# Patient Record
Sex: Female | Born: 1950 | Race: White | Hispanic: No | Marital: Single
Health system: Southern US, Community
[De-identification: ages and names within clinical notes are randomized; demographics above are authoritative.]

## PROBLEM LIST (undated history)

## (undated) DIAGNOSIS — Z86010 Personal history of colonic polyps: Secondary | ICD-10-CM

## (undated) DIAGNOSIS — I2699 Other pulmonary embolism without acute cor pulmonale: Secondary | ICD-10-CM

## (undated) DIAGNOSIS — M1990 Unspecified osteoarthritis, unspecified site: Secondary | ICD-10-CM

## (undated) DIAGNOSIS — C50911 Malignant neoplasm of unspecified site of right female breast: Secondary | ICD-10-CM

## (undated) DIAGNOSIS — K219 Gastro-esophageal reflux disease without esophagitis: Secondary | ICD-10-CM

## (undated) DIAGNOSIS — M545 Low back pain, unspecified: Secondary | ICD-10-CM

## (undated) DIAGNOSIS — E7800 Pure hypercholesterolemia, unspecified: Secondary | ICD-10-CM

## (undated) DIAGNOSIS — F32A Depression, unspecified: Secondary | ICD-10-CM

## (undated) DIAGNOSIS — E669 Obesity, unspecified: Secondary | ICD-10-CM

## (undated) DIAGNOSIS — G8929 Other chronic pain: Secondary | ICD-10-CM

## (undated) DIAGNOSIS — Z9221 Personal history of antineoplastic chemotherapy: Secondary | ICD-10-CM

## (undated) DIAGNOSIS — I82409 Acute embolism and thrombosis of unspecified deep veins of unspecified lower extremity: Secondary | ICD-10-CM

## (undated) DIAGNOSIS — I10 Essential (primary) hypertension: Secondary | ICD-10-CM

## (undated) DIAGNOSIS — Z860101 Personal history of adenomatous and serrated colon polyps: Secondary | ICD-10-CM

## (undated) DIAGNOSIS — J45909 Unspecified asthma, uncomplicated: Secondary | ICD-10-CM

## (undated) DIAGNOSIS — M4310 Spondylolisthesis, site unspecified: Secondary | ICD-10-CM

## (undated) DIAGNOSIS — F329 Major depressive disorder, single episode, unspecified: Secondary | ICD-10-CM

## (undated) DIAGNOSIS — Z923 Personal history of irradiation: Secondary | ICD-10-CM

## (undated) DIAGNOSIS — K5790 Diverticulosis of intestine, part unspecified, without perforation or abscess without bleeding: Secondary | ICD-10-CM

## (undated) DIAGNOSIS — F419 Anxiety disorder, unspecified: Secondary | ICD-10-CM

## (undated) HISTORY — DX: Personal history of colonic polyps: Z86.010

## (undated) HISTORY — PX: CARDIAC CATHETERIZATION: SHX172

## (undated) HISTORY — DX: Major depressive disorder, single episode, unspecified: F32.9

## (undated) HISTORY — PX: TUBAL LIGATION: SHX77

## (undated) HISTORY — DX: Unspecified osteoarthritis, unspecified site: M19.90

## (undated) HISTORY — DX: Diverticulosis of intestine, part unspecified, without perforation or abscess without bleeding: K57.90

## (undated) HISTORY — DX: Gastro-esophageal reflux disease without esophagitis: K21.9

## (undated) HISTORY — DX: Personal history of adenomatous and serrated colon polyps: Z86.0101

## (undated) HISTORY — PX: HERNIA REPAIR: SHX51

## (undated) HISTORY — DX: Depression, unspecified: F32.A

## (undated) HISTORY — DX: Obesity, unspecified: E66.9

---

## 1981-03-17 HISTORY — PX: UMBILICAL HERNIA REPAIR: SHX196

## 1997-06-30 ENCOUNTER — Encounter: Admission: RE | Admit: 1997-06-30 | Discharge: 1997-06-30 | Payer: Self-pay | Admitting: Family Medicine

## 1997-08-17 ENCOUNTER — Encounter: Admission: RE | Admit: 1997-08-17 | Discharge: 1997-08-17 | Payer: Self-pay | Admitting: Family Medicine

## 1997-08-28 ENCOUNTER — Encounter: Admission: RE | Admit: 1997-08-28 | Discharge: 1997-08-28 | Payer: Self-pay | Admitting: Family Medicine

## 1997-10-10 ENCOUNTER — Encounter: Admission: RE | Admit: 1997-10-10 | Discharge: 1997-10-10 | Payer: Self-pay | Admitting: Sports Medicine

## 1997-10-23 ENCOUNTER — Encounter: Admission: RE | Admit: 1997-10-23 | Discharge: 1997-10-23 | Payer: Self-pay | Admitting: Family Medicine

## 1998-01-05 ENCOUNTER — Encounter: Admission: RE | Admit: 1998-01-05 | Discharge: 1998-01-05 | Payer: Self-pay | Admitting: Family Medicine

## 1998-02-27 ENCOUNTER — Encounter: Admission: RE | Admit: 1998-02-27 | Discharge: 1998-02-27 | Payer: Self-pay | Admitting: Family Medicine

## 1998-03-17 DIAGNOSIS — C50911 Malignant neoplasm of unspecified site of right female breast: Secondary | ICD-10-CM

## 1998-03-17 HISTORY — DX: Malignant neoplasm of unspecified site of right female breast: C50.911

## 1998-03-17 HISTORY — PX: BREAST BIOPSY: SHX20

## 1998-03-17 HISTORY — PX: BREAST LUMPECTOMY: SHX2

## 1998-03-30 ENCOUNTER — Ambulatory Visit (HOSPITAL_BASED_OUTPATIENT_CLINIC_OR_DEPARTMENT_OTHER): Admission: RE | Admit: 1998-03-30 | Discharge: 1998-03-30 | Payer: Self-pay | Admitting: General Surgery

## 1998-04-10 ENCOUNTER — Encounter: Payer: Self-pay | Admitting: General Surgery

## 1998-04-11 ENCOUNTER — Ambulatory Visit (HOSPITAL_COMMUNITY): Admission: RE | Admit: 1998-04-11 | Discharge: 1998-04-11 | Payer: Self-pay | Admitting: General Surgery

## 1998-04-11 ENCOUNTER — Encounter: Payer: Self-pay | Admitting: General Surgery

## 1998-04-22 ENCOUNTER — Emergency Department (HOSPITAL_COMMUNITY): Admission: EM | Admit: 1998-04-22 | Discharge: 1998-04-22 | Payer: Self-pay | Admitting: Emergency Medicine

## 1998-04-25 ENCOUNTER — Encounter: Admission: RE | Admit: 1998-04-25 | Discharge: 1998-07-24 | Payer: Self-pay | Admitting: Radiation Oncology

## 1998-04-26 ENCOUNTER — Encounter: Admission: RE | Admit: 1998-04-26 | Discharge: 1998-04-26 | Payer: Self-pay | Admitting: Family Medicine

## 1998-05-01 ENCOUNTER — Encounter: Admission: RE | Admit: 1998-05-01 | Discharge: 1998-05-01 | Payer: Self-pay | Admitting: Sports Medicine

## 1998-05-16 ENCOUNTER — Encounter: Payer: Self-pay | Admitting: General Surgery

## 1998-05-16 ENCOUNTER — Ambulatory Visit (HOSPITAL_BASED_OUTPATIENT_CLINIC_OR_DEPARTMENT_OTHER): Admission: RE | Admit: 1998-05-16 | Discharge: 1998-05-16 | Payer: Self-pay | Admitting: General Surgery

## 1998-06-04 ENCOUNTER — Encounter: Admission: RE | Admit: 1998-06-04 | Discharge: 1998-06-04 | Payer: Self-pay | Admitting: Sports Medicine

## 1998-07-25 ENCOUNTER — Encounter: Admission: RE | Admit: 1998-07-25 | Discharge: 1998-10-23 | Payer: Self-pay | Admitting: Radiation Oncology

## 1998-08-24 ENCOUNTER — Encounter: Admission: RE | Admit: 1998-08-24 | Discharge: 1998-08-24 | Payer: Self-pay | Admitting: Family Medicine

## 1998-09-05 ENCOUNTER — Encounter: Admission: RE | Admit: 1998-09-05 | Discharge: 1998-09-05 | Payer: Self-pay | Admitting: Family Medicine

## 1998-09-26 ENCOUNTER — Ambulatory Visit (HOSPITAL_BASED_OUTPATIENT_CLINIC_OR_DEPARTMENT_OTHER): Admission: RE | Admit: 1998-09-26 | Discharge: 1998-09-26 | Payer: Self-pay | Admitting: General Surgery

## 1998-11-20 ENCOUNTER — Encounter: Admission: RE | Admit: 1998-11-20 | Discharge: 1998-11-20 | Payer: Self-pay | Admitting: Sports Medicine

## 1998-12-29 ENCOUNTER — Emergency Department (HOSPITAL_COMMUNITY): Admission: EM | Admit: 1998-12-29 | Discharge: 1998-12-29 | Payer: Self-pay

## 1998-12-31 ENCOUNTER — Ambulatory Visit (HOSPITAL_COMMUNITY): Admission: RE | Admit: 1998-12-31 | Discharge: 1998-12-31 | Payer: Self-pay | Admitting: General Surgery

## 1998-12-31 ENCOUNTER — Encounter: Payer: Self-pay | Admitting: General Surgery

## 1999-01-01 ENCOUNTER — Emergency Department (HOSPITAL_COMMUNITY): Admission: EM | Admit: 1999-01-01 | Discharge: 1999-01-01 | Payer: Self-pay | Admitting: Emergency Medicine

## 1999-01-14 ENCOUNTER — Encounter: Admission: RE | Admit: 1999-01-14 | Discharge: 1999-01-14 | Payer: Self-pay | Admitting: Family Medicine

## 1999-04-15 ENCOUNTER — Encounter: Admission: RE | Admit: 1999-04-15 | Discharge: 1999-04-15 | Payer: Self-pay | Admitting: Gastroenterology

## 1999-04-15 ENCOUNTER — Encounter: Payer: Self-pay | Admitting: Gastroenterology

## 1999-05-04 ENCOUNTER — Emergency Department (HOSPITAL_COMMUNITY): Admission: EM | Admit: 1999-05-04 | Discharge: 1999-05-04 | Payer: Self-pay

## 1999-05-24 ENCOUNTER — Encounter: Payer: Self-pay | Admitting: Gastroenterology

## 1999-05-24 ENCOUNTER — Ambulatory Visit (HOSPITAL_COMMUNITY): Admission: RE | Admit: 1999-05-24 | Discharge: 1999-05-24 | Payer: Self-pay | Admitting: Gastroenterology

## 1999-08-20 ENCOUNTER — Encounter: Payer: Self-pay | Admitting: Family Medicine

## 1999-08-20 ENCOUNTER — Encounter: Admission: RE | Admit: 1999-08-20 | Discharge: 1999-08-20 | Payer: Self-pay | Admitting: Family Medicine

## 1999-09-12 ENCOUNTER — Encounter: Payer: Self-pay | Admitting: General Surgery

## 1999-09-12 ENCOUNTER — Encounter: Admission: RE | Admit: 1999-09-12 | Discharge: 1999-09-12 | Payer: Self-pay | Admitting: General Surgery

## 2000-08-19 ENCOUNTER — Ambulatory Visit (HOSPITAL_COMMUNITY): Admission: RE | Admit: 2000-08-19 | Discharge: 2000-08-19 | Payer: Self-pay | Admitting: Hematology and Oncology

## 2000-08-19 ENCOUNTER — Encounter: Payer: Self-pay | Admitting: Hematology and Oncology

## 2000-10-13 ENCOUNTER — Other Ambulatory Visit: Admission: RE | Admit: 2000-10-13 | Discharge: 2000-10-13 | Payer: Self-pay | Admitting: *Deleted

## 2000-10-15 ENCOUNTER — Encounter: Admission: RE | Admit: 2000-10-15 | Discharge: 2000-10-15 | Payer: Self-pay | Admitting: General Surgery

## 2000-10-15 ENCOUNTER — Encounter: Payer: Self-pay | Admitting: General Surgery

## 2000-11-12 ENCOUNTER — Ambulatory Visit (HOSPITAL_BASED_OUTPATIENT_CLINIC_OR_DEPARTMENT_OTHER): Admission: RE | Admit: 2000-11-12 | Discharge: 2000-11-12 | Payer: Self-pay | Admitting: *Deleted

## 2000-11-12 ENCOUNTER — Encounter (INDEPENDENT_AMBULATORY_CARE_PROVIDER_SITE_OTHER): Payer: Self-pay | Admitting: *Deleted

## 2001-09-13 ENCOUNTER — Ambulatory Visit (HOSPITAL_COMMUNITY): Admission: RE | Admit: 2001-09-13 | Discharge: 2001-09-13 | Payer: Self-pay | Admitting: Hematology and Oncology

## 2001-09-13 ENCOUNTER — Encounter: Payer: Self-pay | Admitting: Hematology and Oncology

## 2001-10-18 ENCOUNTER — Encounter: Admission: RE | Admit: 2001-10-18 | Discharge: 2001-10-18 | Payer: Self-pay | Admitting: Hematology and Oncology

## 2001-10-18 ENCOUNTER — Encounter: Payer: Self-pay | Admitting: Hematology and Oncology

## 2002-02-01 ENCOUNTER — Other Ambulatory Visit: Admission: RE | Admit: 2002-02-01 | Discharge: 2002-02-01 | Payer: Self-pay | Admitting: *Deleted

## 2002-02-03 ENCOUNTER — Encounter: Admission: RE | Admit: 2002-02-03 | Discharge: 2002-02-03 | Payer: Self-pay | Admitting: Obstetrics and Gynecology

## 2002-02-03 ENCOUNTER — Encounter: Payer: Self-pay | Admitting: Obstetrics and Gynecology

## 2002-03-29 ENCOUNTER — Encounter: Payer: Self-pay | Admitting: Oncology

## 2002-03-29 ENCOUNTER — Ambulatory Visit (HOSPITAL_COMMUNITY): Admission: RE | Admit: 2002-03-29 | Discharge: 2002-03-29 | Payer: Self-pay | Admitting: Oncology

## 2002-04-09 ENCOUNTER — Emergency Department (HOSPITAL_COMMUNITY): Admission: EM | Admit: 2002-04-09 | Discharge: 2002-04-09 | Payer: Self-pay | Admitting: Emergency Medicine

## 2002-11-12 ENCOUNTER — Encounter: Payer: Self-pay | Admitting: Emergency Medicine

## 2002-11-12 ENCOUNTER — Emergency Department (HOSPITAL_COMMUNITY): Admission: EM | Admit: 2002-11-12 | Discharge: 2002-11-12 | Payer: Self-pay | Admitting: Emergency Medicine

## 2002-11-23 ENCOUNTER — Other Ambulatory Visit: Admission: RE | Admit: 2002-11-23 | Discharge: 2002-11-23 | Payer: Self-pay | Admitting: *Deleted

## 2002-12-21 ENCOUNTER — Encounter: Payer: Self-pay | Admitting: Family Medicine

## 2002-12-21 ENCOUNTER — Encounter: Admission: RE | Admit: 2002-12-21 | Discharge: 2002-12-21 | Payer: Self-pay | Admitting: Family Medicine

## 2002-12-28 ENCOUNTER — Encounter: Payer: Self-pay | Admitting: Family Medicine

## 2002-12-28 ENCOUNTER — Encounter: Admission: RE | Admit: 2002-12-28 | Discharge: 2002-12-28 | Payer: Self-pay | Admitting: Family Medicine

## 2003-02-01 ENCOUNTER — Emergency Department (HOSPITAL_COMMUNITY): Admission: EM | Admit: 2003-02-01 | Discharge: 2003-02-01 | Payer: Self-pay | Admitting: Emergency Medicine

## 2003-04-22 ENCOUNTER — Emergency Department (HOSPITAL_COMMUNITY): Admission: AD | Admit: 2003-04-22 | Discharge: 2003-04-22 | Payer: Self-pay | Admitting: Internal Medicine

## 2003-08-25 ENCOUNTER — Emergency Department (HOSPITAL_COMMUNITY): Admission: AD | Admit: 2003-08-25 | Discharge: 2003-08-25 | Payer: Self-pay | Admitting: Family Medicine

## 2003-09-21 ENCOUNTER — Emergency Department (HOSPITAL_COMMUNITY): Admission: EM | Admit: 2003-09-21 | Discharge: 2003-09-21 | Payer: Self-pay | Admitting: Emergency Medicine

## 2003-09-22 ENCOUNTER — Encounter: Admission: RE | Admit: 2003-09-22 | Discharge: 2003-09-22 | Payer: Self-pay | Admitting: General Surgery

## 2003-10-06 ENCOUNTER — Encounter: Admission: RE | Admit: 2003-10-06 | Discharge: 2003-10-06 | Payer: Self-pay | Admitting: General Surgery

## 2003-12-14 ENCOUNTER — Other Ambulatory Visit: Admission: RE | Admit: 2003-12-14 | Discharge: 2003-12-14 | Payer: Self-pay | Admitting: *Deleted

## 2004-03-22 ENCOUNTER — Ambulatory Visit: Payer: Self-pay | Admitting: Hematology and Oncology

## 2004-04-05 ENCOUNTER — Encounter: Admission: RE | Admit: 2004-04-05 | Discharge: 2004-04-05 | Payer: Self-pay | Admitting: Hematology and Oncology

## 2004-06-26 ENCOUNTER — Ambulatory Visit: Payer: Self-pay | Admitting: Hematology and Oncology

## 2004-06-27 ENCOUNTER — Ambulatory Visit: Payer: Self-pay | Admitting: Hematology and Oncology

## 2004-08-18 ENCOUNTER — Emergency Department (HOSPITAL_COMMUNITY): Admission: EM | Admit: 2004-08-18 | Discharge: 2004-08-18 | Payer: Self-pay | Admitting: Emergency Medicine

## 2004-12-24 ENCOUNTER — Ambulatory Visit: Payer: Self-pay | Admitting: Hematology and Oncology

## 2005-03-28 ENCOUNTER — Emergency Department (HOSPITAL_COMMUNITY): Admission: EM | Admit: 2005-03-28 | Discharge: 2005-03-28 | Payer: Self-pay | Admitting: Family Medicine

## 2005-04-10 ENCOUNTER — Encounter: Admission: RE | Admit: 2005-04-10 | Discharge: 2005-04-10 | Payer: Self-pay | Admitting: Hematology and Oncology

## 2005-04-11 ENCOUNTER — Ambulatory Visit: Payer: Self-pay | Admitting: Internal Medicine

## 2005-04-22 ENCOUNTER — Ambulatory Visit: Payer: Self-pay | Admitting: Internal Medicine

## 2005-04-24 ENCOUNTER — Other Ambulatory Visit: Admission: RE | Admit: 2005-04-24 | Discharge: 2005-04-24 | Payer: Self-pay | Admitting: Gynecology

## 2005-04-24 ENCOUNTER — Ambulatory Visit: Payer: Self-pay | Admitting: Internal Medicine

## 2005-04-28 ENCOUNTER — Ambulatory Visit: Payer: Self-pay | Admitting: Internal Medicine

## 2005-06-24 ENCOUNTER — Ambulatory Visit: Payer: Self-pay | Admitting: Internal Medicine

## 2005-07-18 ENCOUNTER — Ambulatory Visit: Payer: Self-pay | Admitting: Hematology and Oncology

## 2005-07-23 ENCOUNTER — Ambulatory Visit: Payer: Self-pay | Admitting: Internal Medicine

## 2005-08-26 LAB — COMPREHENSIVE METABOLIC PANEL
ALT: 9 U/L (ref 0–40)
AST: 13 U/L (ref 0–37)
Albumin: 4.1 g/dL (ref 3.5–5.2)
Alkaline Phosphatase: 75 U/L (ref 39–117)
BUN: 18 mg/dL (ref 6–23)
CO2: 26 mEq/L (ref 19–32)
Calcium: 9.2 mg/dL (ref 8.4–10.5)
Chloride: 105 mEq/L (ref 96–112)
Creatinine, Ser: 1 mg/dL (ref 0.40–1.20)
Glucose, Bld: 98 mg/dL (ref 70–99)
Potassium: 4.3 mEq/L (ref 3.5–5.3)
Sodium: 140 mEq/L (ref 135–145)
Total Bilirubin: 0.6 mg/dL (ref 0.3–1.2)
Total Protein: 7.2 g/dL (ref 6.0–8.3)

## 2005-08-26 LAB — CBC WITH DIFFERENTIAL/PLATELET
BASO%: 0.6 % (ref 0.0–2.0)
EOS%: 2.3 % (ref 0.0–7.0)
LYMPH%: 30.1 % (ref 14.0–48.0)
MCHC: 34.7 g/dL (ref 32.0–36.0)
MONO#: 0.4 10*3/uL (ref 0.1–0.9)
RBC: 4.16 10*6/uL (ref 3.70–5.32)
WBC: 5.5 10*3/uL (ref 3.9–10.0)
lymph#: 1.7 10*3/uL (ref 0.9–3.3)

## 2005-08-26 LAB — CANCER ANTIGEN 27.29: CA 27.29: 13 U/mL (ref 0–39)

## 2005-10-20 ENCOUNTER — Ambulatory Visit: Payer: Self-pay | Admitting: Internal Medicine

## 2006-02-23 ENCOUNTER — Ambulatory Visit: Payer: Self-pay | Admitting: Hematology and Oncology

## 2006-02-25 LAB — CBC WITH DIFFERENTIAL/PLATELET
Basophils Absolute: 0 10*3/uL (ref 0.0–0.1)
Eosinophils Absolute: 0.1 10*3/uL (ref 0.0–0.5)
HCT: 36.3 % (ref 34.8–46.6)
HGB: 12.5 g/dL (ref 11.6–15.9)
LYMPH%: 31 % (ref 14.0–48.0)
MONO#: 0.4 10*3/uL (ref 0.1–0.9)
NEUT#: 3.3 10*3/uL (ref 1.5–6.5)
Platelets: 265 10*3/uL (ref 145–400)
RBC: 3.78 10*6/uL (ref 3.70–5.32)
WBC: 5.6 10*3/uL (ref 3.9–10.0)

## 2006-02-25 LAB — COMPREHENSIVE METABOLIC PANEL
Albumin: 4.2 g/dL (ref 3.5–5.2)
CO2: 25 mEq/L (ref 19–32)
Glucose, Bld: 99 mg/dL (ref 70–99)
Sodium: 141 mEq/L (ref 135–145)
Total Bilirubin: 0.6 mg/dL (ref 0.3–1.2)
Total Protein: 6.5 g/dL (ref 6.0–8.3)

## 2006-02-25 LAB — CANCER ANTIGEN 27.29: CA 27.29: 13 U/mL (ref 0–39)

## 2006-02-25 LAB — LACTATE DEHYDROGENASE: LDH: 167 U/L (ref 94–250)

## 2006-04-13 ENCOUNTER — Encounter: Admission: RE | Admit: 2006-04-13 | Discharge: 2006-04-13 | Payer: Self-pay | Admitting: Hematology and Oncology

## 2006-04-14 ENCOUNTER — Ambulatory Visit: Payer: Self-pay | Admitting: Hematology and Oncology

## 2006-05-16 ENCOUNTER — Emergency Department (HOSPITAL_COMMUNITY): Admission: EM | Admit: 2006-05-16 | Discharge: 2006-05-16 | Payer: Self-pay | Admitting: Family Medicine

## 2006-09-16 ENCOUNTER — Encounter: Admission: RE | Admit: 2006-09-16 | Discharge: 2006-09-16 | Payer: Self-pay | Admitting: Internal Medicine

## 2006-11-08 ENCOUNTER — Emergency Department (HOSPITAL_COMMUNITY): Admission: EM | Admit: 2006-11-08 | Discharge: 2006-11-08 | Payer: Self-pay | Admitting: Emergency Medicine

## 2006-12-04 ENCOUNTER — Emergency Department (HOSPITAL_COMMUNITY): Admission: EM | Admit: 2006-12-04 | Discharge: 2006-12-04 | Payer: Self-pay | Admitting: Emergency Medicine

## 2006-12-05 ENCOUNTER — Emergency Department (HOSPITAL_COMMUNITY): Admission: EM | Admit: 2006-12-05 | Discharge: 2006-12-05 | Payer: Self-pay | Admitting: Emergency Medicine

## 2006-12-07 ENCOUNTER — Ambulatory Visit (HOSPITAL_COMMUNITY): Admission: RE | Admit: 2006-12-07 | Discharge: 2006-12-07 | Payer: Self-pay | Admitting: Emergency Medicine

## 2006-12-16 ENCOUNTER — Ambulatory Visit: Payer: Self-pay | Admitting: Hematology and Oncology

## 2006-12-18 LAB — COMPREHENSIVE METABOLIC PANEL
ALT: 10 U/L (ref 0–35)
AST: 13 U/L (ref 0–37)
Alkaline Phosphatase: 81 U/L (ref 39–117)
BUN: 18 mg/dL (ref 6–23)
Chloride: 105 mEq/L (ref 96–112)
Creatinine, Ser: 1.22 mg/dL — ABNORMAL HIGH (ref 0.40–1.20)
Total Bilirubin: 0.7 mg/dL (ref 0.3–1.2)

## 2006-12-18 LAB — CBC WITH DIFFERENTIAL/PLATELET
BASO%: 0.7 % (ref 0.0–2.0)
Basophils Absolute: 0 10*3/uL (ref 0.0–0.1)
EOS%: 2.9 % (ref 0.0–7.0)
HCT: 39.2 % (ref 34.8–46.6)
LYMPH%: 32.7 % (ref 14.0–48.0)
MCH: 33.3 pg (ref 26.0–34.0)
MCHC: 35.3 g/dL (ref 32.0–36.0)
MCV: 94.2 fL (ref 81.0–101.0)
MONO%: 6.7 % (ref 0.0–13.0)
NEUT%: 57 % (ref 39.6–76.8)
lymph#: 1.6 10*3/uL (ref 0.9–3.3)

## 2006-12-31 ENCOUNTER — Ambulatory Visit (HOSPITAL_COMMUNITY): Admission: RE | Admit: 2006-12-31 | Discharge: 2006-12-31 | Payer: Self-pay | Admitting: Internal Medicine

## 2007-01-21 ENCOUNTER — Encounter: Payer: Self-pay | Admitting: Internal Medicine

## 2007-01-21 DIAGNOSIS — Z853 Personal history of malignant neoplasm of breast: Secondary | ICD-10-CM

## 2007-01-21 DIAGNOSIS — F329 Major depressive disorder, single episode, unspecified: Secondary | ICD-10-CM | POA: Insufficient documentation

## 2007-01-21 DIAGNOSIS — M545 Low back pain: Secondary | ICD-10-CM

## 2007-01-21 DIAGNOSIS — M1990 Unspecified osteoarthritis, unspecified site: Secondary | ICD-10-CM | POA: Insufficient documentation

## 2007-01-27 ENCOUNTER — Ambulatory Visit: Payer: Self-pay | Admitting: Internal Medicine

## 2007-04-13 ENCOUNTER — Ambulatory Visit: Payer: Self-pay | Admitting: Internal Medicine

## 2007-04-13 DIAGNOSIS — K219 Gastro-esophageal reflux disease without esophagitis: Secondary | ICD-10-CM | POA: Insufficient documentation

## 2007-04-13 DIAGNOSIS — J310 Chronic rhinitis: Secondary | ICD-10-CM | POA: Insufficient documentation

## 2007-04-13 DIAGNOSIS — J440 Chronic obstructive pulmonary disease with acute lower respiratory infection: Secondary | ICD-10-CM | POA: Insufficient documentation

## 2007-04-18 ENCOUNTER — Emergency Department (HOSPITAL_COMMUNITY): Admission: EM | Admit: 2007-04-18 | Discharge: 2007-04-18 | Payer: Self-pay | Admitting: Emergency Medicine

## 2007-05-13 ENCOUNTER — Encounter: Payer: Self-pay | Admitting: Internal Medicine

## 2007-08-05 ENCOUNTER — Encounter: Admission: RE | Admit: 2007-08-05 | Discharge: 2007-08-05 | Payer: Self-pay | Admitting: Internal Medicine

## 2007-08-30 ENCOUNTER — Ambulatory Visit: Payer: Self-pay | Admitting: Hematology and Oncology

## 2007-09-11 ENCOUNTER — Emergency Department (HOSPITAL_COMMUNITY): Admission: EM | Admit: 2007-09-11 | Discharge: 2007-09-11 | Payer: Self-pay | Admitting: Emergency Medicine

## 2007-09-14 LAB — COMPREHENSIVE METABOLIC PANEL
Albumin: 4 g/dL (ref 3.5–5.2)
Alkaline Phosphatase: 62 U/L (ref 39–117)
BUN: 20 mg/dL (ref 6–23)
Creatinine, Ser: 0.95 mg/dL (ref 0.40–1.20)
Glucose, Bld: 92 mg/dL (ref 70–99)
Total Bilirubin: 0.3 mg/dL (ref 0.3–1.2)

## 2007-09-14 LAB — CBC WITH DIFFERENTIAL/PLATELET
BASO%: 0.2 % (ref 0.0–2.0)
Basophils Absolute: 0 10*3/uL (ref 0.0–0.1)
EOS%: 0.1 % (ref 0.0–7.0)
HCT: 37.2 % (ref 34.8–46.6)
HGB: 12.9 g/dL (ref 11.6–15.9)
LYMPH%: 19 % (ref 14.0–48.0)
MCH: 32.9 pg (ref 26.0–34.0)
MCHC: 34.6 g/dL (ref 32.0–36.0)
MCV: 94.9 fL (ref 81.0–101.0)
MONO%: 4.9 % (ref 0.0–13.0)
NEUT%: 75.8 % (ref 39.6–76.8)

## 2007-09-16 ENCOUNTER — Encounter: Admission: RE | Admit: 2007-09-16 | Discharge: 2007-09-16 | Payer: Self-pay | Admitting: Hematology and Oncology

## 2007-09-20 ENCOUNTER — Ambulatory Visit (HOSPITAL_COMMUNITY): Admission: RE | Admit: 2007-09-20 | Discharge: 2007-09-20 | Payer: Self-pay | Admitting: Hematology and Oncology

## 2007-10-20 ENCOUNTER — Emergency Department (HOSPITAL_COMMUNITY): Admission: EM | Admit: 2007-10-20 | Discharge: 2007-10-21 | Payer: Self-pay | Admitting: Emergency Medicine

## 2008-02-03 ENCOUNTER — Ambulatory Visit: Payer: Self-pay | Admitting: Cardiovascular Disease

## 2008-02-03 ENCOUNTER — Observation Stay (HOSPITAL_COMMUNITY): Admission: EM | Admit: 2008-02-03 | Discharge: 2008-02-04 | Payer: Self-pay | Admitting: Emergency Medicine

## 2008-02-04 ENCOUNTER — Ambulatory Visit: Payer: Self-pay

## 2008-03-05 ENCOUNTER — Emergency Department (HOSPITAL_COMMUNITY): Admission: EM | Admit: 2008-03-05 | Discharge: 2008-03-05 | Payer: Self-pay | Admitting: Family Medicine

## 2008-03-20 ENCOUNTER — Telehealth: Payer: Self-pay | Admitting: Internal Medicine

## 2008-06-01 ENCOUNTER — Ambulatory Visit: Payer: Self-pay | Admitting: Hematology and Oncology

## 2008-06-05 LAB — COMPREHENSIVE METABOLIC PANEL
ALT: 16 U/L (ref 0–35)
AST: 17 U/L (ref 0–37)
CO2: 28 mEq/L (ref 19–32)
Chloride: 106 mEq/L (ref 96–112)
Creatinine, Ser: 1.03 mg/dL (ref 0.40–1.20)
Sodium: 140 mEq/L (ref 135–145)
Total Bilirubin: 0.5 mg/dL (ref 0.3–1.2)
Total Protein: 7 g/dL (ref 6.0–8.3)

## 2008-06-05 LAB — CBC WITH DIFFERENTIAL/PLATELET
BASO%: 0.7 % (ref 0.0–2.0)
Eosinophils Absolute: 0.1 10*3/uL (ref 0.0–0.5)
LYMPH%: 32.1 % (ref 14.0–49.7)
MCHC: 34.4 g/dL (ref 31.5–36.0)
MONO#: 0.3 10*3/uL (ref 0.1–0.9)
NEUT#: 3.2 10*3/uL (ref 1.5–6.5)
Platelets: 238 10*3/uL (ref 145–400)
RBC: 3.82 10*6/uL (ref 3.70–5.45)
RDW: 13.2 % (ref 11.2–14.5)
WBC: 5.4 10*3/uL (ref 3.9–10.3)

## 2008-06-05 LAB — LACTATE DEHYDROGENASE: LDH: 141 U/L (ref 94–250)

## 2008-08-21 ENCOUNTER — Emergency Department (HOSPITAL_COMMUNITY): Admission: EM | Admit: 2008-08-21 | Discharge: 2008-08-21 | Payer: Self-pay | Admitting: Family Medicine

## 2009-01-16 ENCOUNTER — Emergency Department (HOSPITAL_COMMUNITY): Admission: EM | Admit: 2009-01-16 | Discharge: 2009-01-16 | Payer: Self-pay | Admitting: Emergency Medicine

## 2009-01-18 ENCOUNTER — Ambulatory Visit: Payer: Self-pay | Admitting: Pulmonary Disease

## 2009-01-18 DIAGNOSIS — R079 Chest pain, unspecified: Secondary | ICD-10-CM

## 2009-01-22 ENCOUNTER — Ambulatory Visit: Payer: Self-pay | Admitting: Internal Medicine

## 2009-01-23 LAB — CONVERTED CEMR LAB
Basophils Absolute: 0 10*3/uL (ref 0.0–0.1)
Basophils Relative: 0.5 % (ref 0.0–3.0)
Eosinophils Relative: 3.4 % (ref 0.0–5.0)
HCT: 37.1 % (ref 36.0–46.0)
Hemoglobin: 13.1 g/dL (ref 12.0–15.0)
Lymphocytes Relative: 31.2 % (ref 12.0–46.0)
MCHC: 35.3 g/dL (ref 30.0–36.0)
Neutrophils Relative %: 56.9 % (ref 43.0–77.0)
Platelets: 197 10*3/uL (ref 150.0–400.0)

## 2009-03-22 ENCOUNTER — Telehealth: Payer: Self-pay | Admitting: Internal Medicine

## 2009-03-30 ENCOUNTER — Ambulatory Visit: Payer: Self-pay | Admitting: Internal Medicine

## 2009-04-16 ENCOUNTER — Telehealth: Payer: Self-pay | Admitting: Internal Medicine

## 2009-04-18 ENCOUNTER — Ambulatory Visit: Payer: Self-pay | Admitting: Internal Medicine

## 2009-05-24 ENCOUNTER — Ambulatory Visit: Payer: Self-pay | Admitting: Hematology and Oncology

## 2009-06-27 ENCOUNTER — Ambulatory Visit: Payer: Self-pay | Admitting: Hematology and Oncology

## 2009-06-29 LAB — COMPREHENSIVE METABOLIC PANEL
AST: 17 U/L (ref 0–37)
Albumin: 3.5 g/dL (ref 3.5–5.2)
BUN: 17 mg/dL (ref 6–23)
Chloride: 108 mEq/L (ref 96–112)
Glucose, Bld: 101 mg/dL — ABNORMAL HIGH (ref 70–99)
Potassium: 3.6 mEq/L (ref 3.5–5.3)
Sodium: 141 mEq/L (ref 135–145)
Total Bilirubin: 0.5 mg/dL (ref 0.3–1.2)

## 2009-06-29 LAB — CANCER ANTIGEN 27.29: CA 27.29: 13 U/mL (ref 0–39)

## 2009-06-29 LAB — CBC WITH DIFFERENTIAL/PLATELET
HCT: 35.8 % (ref 34.8–46.6)
HGB: 12 g/dL (ref 11.6–15.9)
MCH: 32.5 pg (ref 25.1–34.0)
MONO#: 0.3 10*3/uL (ref 0.1–0.9)
MONO%: 5.6 % (ref 0.0–14.0)
NEUT#: 2.7 10*3/uL (ref 1.5–6.5)
RDW: 13.2 % (ref 11.2–14.5)
WBC: 5.2 10*3/uL (ref 3.9–10.3)

## 2009-07-04 ENCOUNTER — Encounter: Admission: RE | Admit: 2009-07-04 | Discharge: 2009-07-04 | Payer: Self-pay | Admitting: Hematology and Oncology

## 2009-10-05 ENCOUNTER — Telehealth: Payer: Self-pay | Admitting: Internal Medicine

## 2009-12-31 ENCOUNTER — Encounter (INDEPENDENT_AMBULATORY_CARE_PROVIDER_SITE_OTHER): Payer: Self-pay | Admitting: *Deleted

## 2010-02-05 ENCOUNTER — Encounter (INDEPENDENT_AMBULATORY_CARE_PROVIDER_SITE_OTHER): Payer: Self-pay | Admitting: *Deleted

## 2010-02-06 ENCOUNTER — Ambulatory Visit: Payer: Self-pay | Admitting: Internal Medicine

## 2010-03-06 ENCOUNTER — Other Ambulatory Visit
Admission: RE | Admit: 2010-03-06 | Discharge: 2010-03-06 | Payer: Self-pay | Source: Home / Self Care | Admitting: Family Medicine

## 2010-03-14 ENCOUNTER — Ambulatory Visit: Payer: Self-pay | Admitting: Internal Medicine

## 2010-03-28 ENCOUNTER — Encounter: Payer: Self-pay | Admitting: Internal Medicine

## 2010-03-28 ENCOUNTER — Ambulatory Visit
Admission: RE | Admit: 2010-03-28 | Discharge: 2010-03-28 | Payer: Self-pay | Source: Home / Self Care | Attending: Internal Medicine | Admitting: Internal Medicine

## 2010-04-03 ENCOUNTER — Encounter: Payer: Self-pay | Admitting: Internal Medicine

## 2010-04-07 ENCOUNTER — Encounter: Payer: Self-pay | Admitting: Hematology and Oncology

## 2010-04-08 ENCOUNTER — Encounter: Payer: Self-pay | Admitting: Internal Medicine

## 2010-04-08 ENCOUNTER — Encounter: Payer: Self-pay | Admitting: Hematology and Oncology

## 2010-04-18 NOTE — Assessment & Plan Note (Signed)
Summary: depo injection per cdy/kcw   Nurse Visit   Vital Signs:  Patient profile:   60 year old female Height:      65 inches Weight:      256 pounds BMI:     42.75 O2 Sat:      98 % on Room air Pulse rate:   76 / minute  Vitals Entered By: Reynaldo Minium CMA (April 18, 2009 4:47 PM)  O2 Flow:  Room air  Copy to:  Self Primary Provider/Referring Provider:  Creola Corn   History of Present Illness:  01/18/09- The pt comes in as an acute sick visit for chest pain.  She was in her usual state of health until 5 days ago when she developed an initially sharp pain involving her upper left chest, left shoulder, and left scapula.  It was fleeting in nature, and would recur over the course of the weekend.  It would make her sob when present due to her inability to take a breath due to pain.  She had no viral prodrome, chest congestion or cough.  She had no worsening LE edema, fevers, chills, or sweats.  The pain became more consistent and intense 2 days ago, but the pain was a little different.  This was not sharp, and felt like a dull ache/grabbing pain.  Because it was persistent, she went to ER.  Cxr unremarkable, sats normal, cardiac enzymes unremarkable, and pt sent home on tramadol as needed with ibuprofen.  The pt states today the pain worsened, and now is across her back at the level of the scapulae.  She notes that she cannot move her left arm a lot because it triggers the pain.  The pain is also worsened by movement/stooping/twisting.  She really does not have sob if the pain is not present.  March 30, 2009- Rhinitis, bronchitis, hx breast cancer.......................Marland KitchenKaties's mother I saw in 2009 for persistent cough after bad cold. She was evaluated to R/O PE wit CT in Nov, 2010 with incidental finding of right Aortic arch, but no clot.  Now cough began again 3 weeks ago, scant clear but without sore throat fever or green. She doesn't take flu or pneuomovax.  April 18, 2009-  Cough, rhinitis, bronchitis, hx breast ca Persistent nonproductive cough esp with laugh or fast talking. She is using a codeine cough syrup but still coughs til she retches. She had a cxr at Dr Ferd Hibbs- she understands it was negative. I had offered her a depomedrol shot for trial. Denies postnasal drip but has some sniffle andn sneeze. She takes prevacid but not routinely aware of reflux.    Past History:  Past Medical History: Last updated: 01/21/2007 Breast cancer, hx of-2000 Depression Low back pain  Past Surgical History: Last updated: 04-25-07 Colonoscopy-11/10/2005 Tubal ligation Lumpectomy for breast Ca R with xrt and chemo Herniorrhaphy-umbilical  Family History: Last updated: 04/25/2007 Mother had asthma COPD- died of MI Father died of lung ca Daughter had childhood asthma Brother diabetic, MI, died of renal failure  Social History: Last updated: 04/25/07 Patient never smoked.  Works in food service  Risk Factors: Smoking Status: never (April 25, 2007)   Physical Exam  Additional Exam:  General: A/Ox3; pleasant and cooperative, NAD, SKIN: no rash, lesions NODES: no lymphadenopathy HEENT: [place]/AT, EOM- WNL, Conjuctivae- clear, PERRLA, TM-WNL, Nose- sniffingr, Throat- clear and wnl. dentures, Mellampatti  II, not red or hoarse NECK: Supple w/ fair ROM, JVD- none, normal carotid impulses w/o bruits Thyroid-  CHEST:Minor dry cough  without wheeze HEART: RRR, no m/g/r heard ABDOMEN: Soft and nl; nml bowel sounds; no organomegaly or masses noted EAV:WUJW, nl pulses, no edema  NEURO: Grossly intact to observation      Impression & Recommendations:  Problem # 1:  BRONCHITIS (ICD-490)  Will try depo as per discussion. GERD precautions were re-emphasized. Her updated medication list for this problem includes:    [facility] Tussin Cf 30-10-100 Mg/42ml Liqd (Pseudoephedrine-dm-gg) ..... Use as directed per bottle as needed cough    Benzonatate 100 Mg Caps (Benzonatate)  .Marland Kitchen... 1 four times a day as needed cough    Asmanex 60 Metered Doses 220 Mcg/inh Aepb (Mometasone furoate) .Marland Kitchen... 1 puff and rinse twice daily  Other Orders: No Charge Patient Arrived (NCPA0) (NCPA0) Admin of Therapeutic Inj  intramuscular or subcutaneous ([address]) Depo- Medrol 80mg  (J1040)   Patient Instructions: 1)  Depo 80 2)  Please schedule a follow-up appointment as needed.   Allergies: 1)  ! Penicillin 2)  ! Erythromycin 3)  ! Morphine 4)  ! Amoxicillin  Medication Administration  Injection # 1:    Medication: Depo- Medrol 80mg     Diagnosis: BRONCHITIS (ICD-490)    Route: SQ    Site: RUOQ gluteus    Exp Date: 12/2009    Lot #: [id number]    Mfr: Teva    Patient tolerated injection without complications    Given by: Reynaldo Minium CMA (April 18, 2009 5:14 PM)  Orders Added: 1)  No Charge Patient Arrived (NCPA0) [NCPA0] 2)  Admin of Therapeutic Inj  intramuscular or subcutaneous [96372] 3)  Depo- Medrol 80mg  [J1040]

## 2010-04-18 NOTE — Procedures (Addendum)
Summary: Colonoscopy  Patient: Diane Barker Note: All result statuses are Final unless otherwise noted.  Tests: (1) Colonoscopy (COL)   COL Colonoscopy           DONE     [facility]     [address]     [place], [place]  [address]           COLONOSCOPY PROCEDURE REPORT           PATIENT:  Diane, Barker  MR#:  [id number]     BIRTHDATE:  06-27-1950, 59 yrs. old  GENDER:  female     ENDOSCOPIST:  Hedwig Morton. Juanda Chance, MD     REF. BY:  Laurann Montana, M.D.     PROCEDURE DATE:  03/28/2010     PROCEDURE:  Colonoscopy [address]     ASA CLASS:  Class I     INDICATIONS:  Routine Risk Screening personal hx of breasr cancer,           maternal aunt with colon cancer     MEDICATIONS:   Versed 8 mg, Fentanyl 75 mcg           DESCRIPTION OF PROCEDURE:   After the risks benefits and     alternatives of the procedure were thoroughly explained, informed     consent was obtained.  Digital rectal exam was performed and     revealed no rectal masses.   The LB 180AL [id number] endoscope was     introduced through the anus and advanced to the cecum, which was     identified by both the appendix and ileocecal valve, without     limitations.  The quality of the prep was excellent, using     MiraLax.  The instrument was then slowly withdrawn as the colon     was fully examined.     <<PROCEDUREIMAGES>>           FINDINGS:  Moderate diverticulosis was found in the sigmoid colon     (see image1, image2, and image6).  A sessile polyp was found. 3 mm     cecal polyp The polyp was removed using cold biopsy forceps (see     image3).  Otherwise normal colonoscopy without other polyps,     masses, vascular ectasias, or inflammatory changes (see image4,     image5, and image7).   Retroflexed views in the rectum revealed no     abnormalities.    The scope was then withdrawn from the patient     and the procedure completed.           COMPLICATIONS:  None     ENDOSCOPIC IMPRESSION:     1) Moderate diverticulosis  in the sigmoid colon     2) Sessile polyp     3) Otherwise nl colonoscopy WMO     RECOMMENDATIONS:     1) Await pathology results     2) High fiber diet.     REPEAT EXAM:  In 10 year(s) for.           ______________________________     Hedwig Morton. Juanda Chance, MD           CC:  Laurann Montana, M.D.           n.     eSIGNED:   Hedwig Morton. Krystal Teachey at 03/28/2010 08:26 AM           Rosie Fate, [id number]  Note: An exclamation mark (!) indicates a result that was not  dispersed into the flowsheet. Document Creation Date: 03/28/2010 8:26 AM _______________________________________________________________________  (1) Order result status: Final Collection or observation date-time: 03/28/2010 08:20 Requested date-time:  Receipt date-time:  Reported date-time:  Referring Physician:   Ordering Physician: Lina Sar [id number]) Specimen Source:  Source: Launa Grill Order Number: [id number] Lab site:   Appended Document: Colonoscopy     Procedures Next Due Date:    Colonoscopy: 03/2015

## 2010-04-18 NOTE — Letter (Signed)
Summary: [facility] Instructions  [facility]  [address] [place], [place] [address]   Phone: [phone]  Fax: [phone]       Diane Barker    [birth year]    MRN: [id number]       Procedure Day /Date:  Thursday 03/14/2010     Arrival Time: 7:30 am     Procedure Time:  8:30 am     Location of Procedure:                    _ x_  [facility] (4th Floor)    PREPARATION FOR COLONOSCOPY WITH MIRALAX  Starting 5 days prior to your procedure Saturday 12/24 do not eat nuts, seeds, popcorn, corn, beans, peas,  salads, or any raw vegetables.  Do not take any fiber supplements (e.g. Metamucil, Citrucel, and Benefiber). ____________________________________________________________________________________________________   THE DAY BEFORE YOUR PROCEDURE         DATE: Wednesday 12/28  1   Drink clear liquids the entire day-NO SOLID FOOD  2   Do not drink anything colored red or purple.  Avoid juices with pulp.  No orange juice.  3   Drink at least 64 oz. (8 glasses) of fluid/clear liquids during the day to prevent dehydration and help the prep work efficiently.  CLEAR LIQUIDS INCLUDE: Water Jello Ice Popsicles Tea (sugar ok, no milk/cream) Powdered fruit flavored drinks Coffee (sugar ok, no milk/cream) Gatorade Juice: apple, white grape, white cranberry  Lemonade Clear bullion, consomm, broth Carbonated beverages (any kind) Strained chicken noodle soup Hard Candy  4   Mix the entire bottle of Miralax with 64 oz. of Gatorade/Powerade in the morning and put in the refrigerator to chill.  5   At 3:00 pm take 2 Dulcolax/Bisacodyl tablets.  6   At 4:30 pm take one Reglan/Metoclopramide tablet.  7  Starting at 5:00 pm drink one 8 oz glass of the Miralax mixture every 15-20 minutes until you have finished drinking the entire 64 oz.  You should finish drinking prep around 7:30 or 8:00 pm.  8   If you are nauseated, you may take the 2nd Reglan/Metoclopramide  tablet at 6:30 pm.        9    At 8:00 pm take 2 more DULCOLAX/Bisacodyl tablets.     THE DAY OF YOUR PROCEDURE      DATE: Thursday 12/29  You may drink clear liquids until 6:30 am (2 HOURS BEFORE PROCEDURE).   MEDICATION INSTRUCTIONS  Unless otherwise instructed, you should take regular prescription medications with a small sip of water as early as possible the morning of your procedure.           OTHER INSTRUCTIONS  You will need a responsible adult at least 61 years of age to accompany you and drive you home.   This person must remain in the waiting room during your procedure.  Wear loose fitting clothing that is easily removed.  Leave jewelry and other valuables at home.  However, you may wish to bring a book to read or an iPod/MP3 player to listen to music as you wait for your procedure to start.  Remove all body piercing jewelry and leave at home.  Total time from sign-in until discharge is approximately 2-3 hours.  You should go home directly after your procedure and rest.  You can resume normal activities the day after your procedure.  The day of your procedure you should not:   Drive  Make legal decisions   Operate machinery   Drink alcohol   Return to work  You will receive specific instructions about eating, activities and medications before you leave.   The above instructions have been reviewed and explained to me by   Clide Cliff, RN______________________    I fully understand and can verbalize these instructions _____________________________ Date _______

## 2010-04-18 NOTE — Progress Notes (Signed)
Summary: cold   Phone Note Call from Patient   Caller: Patient Call For: young Summary of Call: cold not any better still coughing [employer] Initial call taken by: Rickard Patience,  April 16, 2009 4:12 PM  Follow-up for Phone Call        [facility] Gweneth Dimitri RN  April 16, 2009 4:32 PM  called, spoke with pt.  Pt was given Benzonatate at last ov on 03-30-09.  states she is still having a "hacking cough" before taking the benzonatate and about 1 hour after taking it c/o "coughing so hard I will throw everything up."  requesting CY's recs.  Will forward to CY-please advise.  Thanks!  Follow-up by: Gweneth Dimitri RN,  April 16, 2009 4:41 PM  Additional Follow-up for Phone Call Additional follow up Details #1::        Per CDY-offer appt to come in and get Depo 80. Spoke with pt; will come by Wednesday afternoon to get injection at OV. Reynaldo Minium CMA  April 16, 2009 4:58 PM

## 2010-04-18 NOTE — Assessment & Plan Note (Signed)
Summary: constent cough/kcw   Copy to:  Self Primary Provider/Referring Provider:  Creola Corn  CC:  Increased cough-deep and non productive.Marland Kitchen  History of Present Illness:  01/18/09- The pt comes in as an acute sick visit for chest pain.  She was in her usual state of health until 5 days ago when she developed an initially sharp pain involving her upper left chest, left shoulder, and left scapula.  It was fleeting in nature, and would recur over the course of the weekend.  It would make her sob when present due to her inability to take a breath due to pain.  She had no viral prodrome, chest congestion or cough.  She had no worsening LE edema, fevers, chills, or sweats.  The pain became more consistent and intense 2 days ago, but the pain was a little different.  This was not sharp, and felt like a dull ache/grabbing pain.  Because it was persistent, she went to ER.  Cxr unremarkable, sats normal, cardiac enzymes unremarkable, and pt sent home on tramadol as needed with ibuprofen.  The pt states today the pain worsened, and now is across her back at the level of the scapulae.  She notes that she cannot move her left arm a lot because it triggers the pain.  The pain is also worsened by movement/stooping/twisting.  She really does not have sob if the pain is not present.  March 30, 2009- Rhinitis, bronchitis, hx breast cancer.......................Marland KitchenKaties's mother I saw in 2009 for persistent cough after bad cold. She was evaluated to R/O PE wit CT in Nov, 2010 with incidental finding of right Aortic arch, but no clot.  Now cough began again 3 weeks ago, scant clear but without sore throat fever or green. She doesn't take flu or pneuomovax.  Current Medications (verified): 1)  Lexapro 10 Mg  Tabs (Escitalopram Oxalate) .... Take 1 Tablet By Mouth Once A Day 2)  Vitamin B12 .... Once A Month 3)  Vicodin 5-500 Mg Tabs (Hydrocodone-Acetaminophen) .... One Every 4- 6 Hours If Needed For Pain 4)   Etodolac 500 Mg Tabs (Etodolac) .... Take 1 By Mouth Two Times A Day 5)  Prevacid 30 Mg Cpdr (Lansoprazole) .... Take 1 By Mouth Once Daily 6)  Cvs Tussin Cf 30-10-100 Mg/51ml Liqd (Pseudoephedrine-Dm-Gg) .... Use As Directed Per Bottle As Needed Cough  Allergies (verified): 1)  ! Penicillin 2)  ! Erythromycin 3)  ! Morphine 4)  ! Amoxicillin  Past History:  Past Medical History: Last updated: 01/21/2007 Breast cancer, hx of-2000 Depression Low back pain  Past Surgical History: Last updated: 04-27-2007 Colonoscopy-11/10/2005 Tubal ligation Lumpectomy for breast Ca R with xrt and chemo Herniorrhaphy-umbilical  Family History: Last updated: 04/27/07 Mother had asthma COPD- died of MI Father died of lung ca Daughter had childhood asthma Brother diabetic, MI, died of renal failure  Social History: Last updated: April 27, 2007 Patient never smoked.  Works in food service  Risk Factors: Smoking Status: never (Apr 27, 2007)  Review of Systems      See HPI       The patient complains of prolonged cough.  The patient denies anorexia, fever, weight loss, weight gain, vision loss, decreased hearing, hoarseness, chest pain, syncope, dyspnea on exertion, peripheral edema, headaches, hemoptysis, and severe indigestion/heartburn.    Vital Signs:  Patient profile:   60 year old female Height:      65 inches Weight:      256 pounds BMI:     42.75 O2 Sat:  98 % on Room air Pulse rate:   80 / minute BP sitting:   110 / 66  (left arm) Cuff size:   large  Vitals Entered By: Reynaldo Minium CMA (March 30, 2009 11:16 AM)  O2 Flow:  Room air  Physical Exam  Additional Exam:  General: A/Ox3; pleasant and cooperative, NAD, SKIN: no rash, lesions NODES: no lymphadenopathy HEENT: [place]/AT, EOM- WNL, Conjuctivae- clear, PERRLA, TM-WNL, Nose- clear, Throat- clear and wnl. dentures, Mellampatti  II, not red or hoarse NECK: Supple w/ fair ROM, JVD- none, normal carotid impulses w/o  bruits Thyroid- n CHEST: Clear to P&A, hacking cough without wheeze. HEART: RRR, no m/g/r heard ABDOMEN: Soft and nl; nml bowel sounds; no organomegaly or masses noted OZH:YQMV, nl pulses, no edema  NEURO: Grossly intact to observation      Impression & Recommendations:  Problem # 1:  BRONCHITIS (ICD-490)  She just finished Avelox. Last time she got like this she says Asmanex worked well. We can give samples of Asmanex and refill benzonatate. Her updated medication list for this problem includes:    Cvs Tussin Cf 30-10-100 Mg/38ml Liqd (Pseudoephedrine-dm-gg) ..... Use as directed per bottle as needed cough    Benzonatate 100 Mg Caps (Benzonatate) .Marland Kitchen... 1 four times a day as needed cough    Asmanex 60 Metered Doses 220 Mcg/inh Aepb (Mometasone furoate) .Marland Kitchen... 1 puff and rinse twice daily  Medications Added to Medication List This Visit: 1)  Vicodin 5-500 Mg Tabs (Hydrocodone-acetaminophen) .... One every 4- 6 hours if needed for pain 2)  Etodolac 500 Mg Tabs (Etodolac) .... Take 1 by mouth two times a day 3)  Prevacid 30 Mg Cpdr (Lansoprazole) .... Take 1 by mouth once daily 4)  Cvs Tussin Cf 30-10-100 Mg/21ml Liqd (Pseudoephedrine-dm-gg) .... Use as directed per bottle as needed cough 5)  Benzonatate 100 Mg Caps (Benzonatate) .Marland Kitchen.. 1 four times a day as needed cough 6)  Asmanex 60 Metered Doses 220 Mcg/inh Aepb (Mometasone furoate) .Marland Kitchen.. 1 puff and rinse twice daily  Other Orders: Est. Patient Level II (784[address])  Patient Instructions: 1)  Schedule return in one year, earlier if needed 2)  Script for benzonatate for cough 3)  Samples of Asmanex steroid inhaler: 1 -2 puffs and rinse mouth, twice daily. Prescriptions: ASMANEX 60 METERED DOSES 220 MCG/INH AEPB (MOMETASONE FUROATE) 1 puff and rinse twice daily  #2 x 0   Entered and Authorized by:   Waymon Budge MD   Signed by:   Waymon Budge MD on 03/30/2009   Method used:   Samples Given   RxID:   [id number] BENZONATATE  100 MG CAPS (BENZONATATE) 1 four times a day as needed cough  #30 x 1   Entered and Authorized by:   Waymon Budge MD   Signed by:   Waymon Budge MD on 03/30/2009   Method used:   Print then Give to Patient   RxID:   [id number]

## 2010-04-18 NOTE — Progress Notes (Signed)
Summary: Sneezing and sore nose  Phone Note Call from Patient Call back at Home Phone [phone]   Caller: Patient Call For: young Reason for Call: Acute Illness Summary of Call: Pt called stating that her nose is extremely sore(feels broken due to pressure). Sniffles and sneezing. Please advise of recs. Initial call taken by: Reynaldo Minium CMA,  October 05, 2009 1:54 PM  Follow-up for Phone Call        per CDY-have pt come in for Neo tx and depo or she can try Afrin OTC or Sudafed PE for now. Spoke with pt; aware of recs and states she will try OTC meds first as recommended.Reynaldo Minium CMA  October 05, 2009 3:32 PM

## 2010-04-18 NOTE — Progress Notes (Signed)
Summary: benzonatate for cough  Phone Note Call from Patient   Summary of Call: Daughter Florentina Addison called. Mother has bronchitis again. Has now had 3 days of Avelox, but cough is worse. She asked benzonatate perles. Plan: Benzonatate 100 mg, # 30, 1 three times a day as needed, ref x 1.to [facility]. Initial call taken by: Waymon Budge MD,  March 22, 2009 9:42 PM

## 2010-04-18 NOTE — Letter (Addendum)
Summary: Patient Notice- Polyp Results  [facility]  [address] [place], [place] [address]   Phone: [phone]  Fax: [phone]        April 03, 2010 MRN: [id number]    Diane Barker [address] [place], [place]  [address]    Dear [name],  I am pleased to inform you that the colon polyp(s) removed during your recent colonoscopy was (were) found to be benign (no cancer detected) upon pathologic examination.Your polyp was adenomatous ( precancerous)  I recommend you have a repeat colonoscopy examination in 5_ years to look for recurrent polyps, as having colon polyps increases your risk for having recurrent polyps or even colon cancer in the future.  Should you develop new or worsening symptoms of abdominal pain, bowel habit changes or bleeding from the rectum or bowels, please schedule an evaluation with either your primary care physician or with me.  Additional information/recommendations:  _x_ No further action with gastroenterology is needed at this time. Please      follow-up with your primary care physician for your other healthcare      needs.  __ Please call [phone] to schedule a return visit to review your      situation.  __ Please keep your follow-up visit as already scheduled.  __ Continue treatment plan as outlined the day of your exam.  Please call us if you are having persistent problems or have questions about your condition that have not been fully answered at this time.  Sincerely,  Hart Carwin MD  This letter has been electronically signed by your physician.  Appended Document: Patient Notice- Polyp Results Letter mailed

## 2010-04-18 NOTE — Letter (Signed)
Summary: Pre Visit Letter Revised  [facility]  [address] [place], [place] [address]   Phone: [phone]  Fax: [phone]        12/31/2009 MRN: [id number] Diane Barker [address] [place], [place]  [address]             Procedure Date:  03-14-10   Welcome to the Gastroenterology Division at [facility].    You are scheduled to see a nurse for your pre-procedure visit on 02-06-10 at 8:30a.m. on the 3rd floor at [facility], [address]  We ask that you try to arrive at our office 15 minutes prior to your appointment time to allow for check-in.  Please take a minute to review the attached form.  If you answer "Yes" to one or more of the questions on the first page, we ask that you call the person listed at your earliest opportunity.  If you answer "No" to all of the questions, please complete the rest of the form and bring it to your appointment.    Your nurse visit will consist of discussing your medical and surgical history, your immediate family medical history, and your medications.   If you are unable to list all of your medications on the form, please bring the medication bottles to your appointment and we will list them.  We will need to be aware of both prescribed and over the counter drugs.  We will need to know exact dosage information as well.    Please be prepared to read and sign documents such as consent forms, a financial agreement, and acknowledgement forms.  If necessary, and with your consent, a friend or relative is welcome to sit-in on the nurse visit with you.  Please bring your insurance card so that we may make a copy of it.  If your insurance requires a referral to see a specialist, please bring your referral form from your primary care physician.  No co-pay is required for this nurse visit.     If you cannot keep your appointment, please call [phone] to cancel or reschedule prior to your appointment date.  This allows [place]  the opportunity to schedule an appointment for another patient in need of care.    Thank you for choosing [facility] for your medical needs.  We appreciate the opportunity to care for you.  Please visit [place] at our website  to learn more about our practice.  Sincerely, The Gastroenterology Division

## 2010-04-18 NOTE — Miscellaneous (Signed)
Summary: direct colon--ch.  Clinical Lists Changes  Medications: Added new medication of MIRALAX   POWD (POLYETHYLENE GLYCOL 3350) As per prep  instructions. - Signed Added new medication of REGLAN 10 MG  TABS (METOCLOPRAMIDE HCL) As per prep instructions. - Signed Added new medication of DULCOLAX 5 MG  TBEC (BISACODYL) Day before procedure take 2 at 3pm and 2 at 8pm. - Signed Rx of MIRALAX   POWD (POLYETHYLENE GLYCOL 3350) As per prep  instructions.;  #255gm x 0;  Signed;  Entered by: Clide Cliff RN;  Authorized by: Hart Carwin MD;  Method used: Electronically to [facility]  [phone]*, [address] [place], [place], [place]  [address], Ph: [phone] or [phone], Fax: [phone] Rx of REGLAN 10 MG  TABS (METOCLOPRAMIDE HCL) As per prep instructions.;  #2 x 0;  Signed;  Entered by: Clide Cliff RN;  Authorized by: Hart Carwin MD;  Method used: Electronically to [facility]  [phone]*, [address] [place], [place], [place]  [address], Ph: [phone] or [phone], Fax: [phone] Rx of DULCOLAX 5 MG  TBEC (BISACODYL) Day before procedure take 2 at 3pm and 2 at 8pm.;  #4 x 0;  Signed;  Entered by: Clide Cliff RN;  Authorized by: Hart Carwin MD;  Method used: Electronically to [facility]  [phone]*, [address] [place], [place], [place]  [address], Ph: [phone] or [phone], Fax: [phone] Allergies: Added new allergy or adverse reaction of PCN Changed allergy or adverse reaction from MORPHINE to MORPHINE Changed allergy or adverse reaction from ERYTHROMYCIN to ERYTHROMYCIN    Prescriptions: DULCOLAX 5 MG  TBEC (BISACODYL) Day before procedure take 2 at 3pm and 2 at 8pm.  #4 x 0   Entered by:   Clide Cliff RN   Authorized by:   Hart Carwin MD   Signed by:   Clide Cliff RN on 02/06/2010   Method used:   Electronically to        [facility]  [phone]* (retail)       [address]       [place], [place]  [address]       Ph: [phone] or [phone]       Fax: [phone]   RxID:   [id number] REGLAN 10 MG  TABS (METOCLOPRAMIDE HCL) As per prep instructions.  #2 x 0   Entered by:   Clide Cliff RN   Authorized by:   Hart Carwin MD   Signed by:   Clide Cliff RN on 02/06/2010   Method used:   Electronically to        [facility]  [phone]* (retail)       [address]       [place], [place]  [address]       Ph: [phone] or [phone]       Fax: [phone]   RxID:   [id number] MIRALAX   POWD (POLYETHYLENE GLYCOL 3350) As per prep  instructions.  #255gm x 0   Entered by:   Clide Cliff RN   Authorized by:   Hart Carwin MD   Signed by:   Clide Cliff RN on 02/06/2010   Method used:   Electronically to        [facility]  [phone]* (retail)       [phone]  [place], [place]  [address]       Ph: [phone] or [phone]       Fax: [phone]   RxID:   [id number]

## 2010-06-19 LAB — DIFFERENTIAL
Basophils Absolute: 0 10*3/uL (ref 0.0–0.1)
Basophils Relative: 1 % (ref 0–1)
Eosinophils Relative: 3 % (ref 0–5)
Lymphocytes Relative: 32 % (ref 12–46)
Monocytes Absolute: 0.4 10*3/uL (ref 0.1–1.0)
Neutro Abs: 2.9 10*3/uL (ref 1.7–7.7)

## 2010-06-19 LAB — BASIC METABOLIC PANEL
BUN: 16 mg/dL (ref 6–23)
CO2: 25 mEq/L (ref 19–32)
Calcium: 8.8 mg/dL (ref 8.4–10.5)
Chloride: 107 mEq/L (ref 96–112)
GFR calc Af Amer: >60 mL/min (ref >60)
GFR calc non Af Amer: 51 mL/min — ABNORMAL LOW (ref >60)
Glucose, Bld: 98 mg/dL (ref 70–99)
Potassium: 3.5 mEq/L (ref 3.5–5.1)

## 2010-06-19 LAB — POCT CARDIAC MARKERS
CKMB, poc: 1.4 ng/mL (ref 1.0–8.0)
Troponin i, poc: <0.05 ng/mL (ref 0.00–0.09)

## 2010-06-19 LAB — CBC
Hemoglobin: 12.8 g/dL (ref 12.0–15.0)
RDW: 12.8 % (ref 11.5–15.5)

## 2010-06-24 ENCOUNTER — Telehealth: Payer: Self-pay | Admitting: Internal Medicine

## 2010-06-24 NOTE — Telephone Encounter (Signed)
Discussed with Dr Juanda Chance pt to try calmoseptine ointment TID.  She is advised of Dr Regino Schultze advice.  I have asked her to call back if she is not better in 2 weeks.

## 2010-06-24 NOTE — Telephone Encounter (Signed)
Colon 1/12/22012, no hems noted. Please start Calmoseptine ointment tid, if no better in 2 weeks, I will see her and reexamine her in the office.

## 2010-06-24 NOTE — Telephone Encounter (Signed)
Pt c/o rectal irritation and pain with defecation since her colonoscopy.  She reports her primary care gave her a rx of hydrocortisone suppositories BID for 2 weeks, but that has not helped at all.  Dr Juanda Chance do you have any additional recommendations.

## 2010-07-03 ENCOUNTER — Other Ambulatory Visit: Payer: Self-pay | Admitting: Hematology and Oncology

## 2010-07-03 ENCOUNTER — Encounter (HOSPITAL_BASED_OUTPATIENT_CLINIC_OR_DEPARTMENT_OTHER): Payer: BC Managed Care – PPO | Admitting: Hematology and Oncology

## 2010-07-03 DIAGNOSIS — C50919 Malignant neoplasm of unspecified site of unspecified female breast: Secondary | ICD-10-CM

## 2010-07-03 DIAGNOSIS — Z1231 Encounter for screening mammogram for malignant neoplasm of breast: Secondary | ICD-10-CM

## 2010-07-03 DIAGNOSIS — M545 Low back pain: Secondary | ICD-10-CM

## 2010-07-03 DIAGNOSIS — Z170 Estrogen receptor positive status [ER+]: Secondary | ICD-10-CM

## 2010-07-03 LAB — CBC WITH DIFFERENTIAL/PLATELET
Basophils Absolute: 0 10*3/uL (ref 0.0–0.1)
EOS%: 2 % (ref 0.0–7.0)
Eosinophils Absolute: 0.1 10*3/uL (ref 0.0–0.5)
HCT: 35.5 % (ref 34.8–46.6)
HGB: 12.3 g/dL (ref 11.6–15.9)
MCH: 33.5 pg (ref 25.1–34.0)
MONO#: 0.5 10*3/uL (ref 0.1–0.9)
NEUT#: 3.4 10*3/uL (ref 1.5–6.5)
NEUT%: 59.3 % (ref 38.4–76.8)
RDW: 12.6 % (ref 11.2–14.5)
lymph#: 1.7 10*3/uL (ref 0.9–3.3)

## 2010-07-03 LAB — CANCER ANTIGEN 27.29: CA 27.29: 9 U/mL (ref 0–39)

## 2010-07-03 LAB — COMPREHENSIVE METABOLIC PANEL
AST: 14 U/L (ref 0–37)
Albumin: 4 g/dL (ref 3.5–5.2)
BUN: 20 mg/dL (ref 6–23)
CO2: 25 mEq/L (ref 19–32)
Calcium: 9.1 mg/dL (ref 8.4–10.5)
Chloride: 106 mEq/L (ref 96–112)
Creatinine, Ser: 1.1 mg/dL (ref 0.40–1.20)
Glucose, Bld: 96 mg/dL (ref 70–99)
Potassium: 3.7 mEq/L (ref 3.5–5.3)

## 2010-07-03 LAB — LACTATE DEHYDROGENASE: LDH: 163 U/L (ref 94–250)

## 2010-07-10 ENCOUNTER — Ambulatory Visit: Payer: Self-pay

## 2010-07-30 ENCOUNTER — Telehealth: Payer: Self-pay | Admitting: Pulmonary Disease

## 2010-07-30 ENCOUNTER — Encounter: Payer: Self-pay | Admitting: Internal Medicine

## 2010-07-30 NOTE — Discharge Summary (Signed)
NAME:  Diane Barker, Diane Barker                  ACCOUNT NO.:  [id number]   MEDICAL RECORD NO.:  [id number]          PATIENT TYPE:  INP   LOCATION:  3707                         FACILITY:  [facility]   PHYSICIAN:  Noralyn Pick. Eden Emms, MD, FACCDATE OF BIRTH:  06-28-1950   DATE OF ADMISSION:  02/03/2008  DATE OF DISCHARGE:  02/04/2008                               DISCHARGE SUMMARY   PRIMARY CARDIOLOGIST:  Theron Arista C. Eden Emms, MD, [facility]   PRIMARY CARE PHYSICIAN:  [name], MD   PROCEDURES PERFORMED DURING HOSPITALIZATION:  None.   FINAL DISCHARGE DIAGNOSES:  1. Chest pain.  2. Hypertension.  3. Depression.   HISTORY OF PRESENT ILLNESS:  This is a 60 year old Caucasian female  admitted with complaints of chest discomfort, which was transient  radiating from the left to the right side of the chest with some left  arm pain, which she described as a shooting pain in the elbow and down  to her hands.  The patient has no prior cardiac history.  She states  that in the long past, she did have a stress test and cardiac  catheterization, although results of that had not been made available.  All of which she was told were negative.  The patient is not followed  with Cardiology since 1997.  The patient was at work when she started  having the discomfort, just walking, not lifting or pulling or moving  anything that would cause exertion.  The patient was seen and examined  by myself and Dr. Charlton Haws in the emergency room and found to be  stable and painfree at the time of evaluation.  She was admitted under  24-hour observation for rule out MI.  Troponins were found to be  negative x2 at 0.05, 0.01, and 0.01 respectively.  Her EKG revealed  normal sinus rhythm without any acute changes.  Followup EKG in the  morning of discharge was same with no evidence of ischemic changes.  It  was found that her potassium was low normal at 3.5, which we will  replete prior to her going home.  Otherwise, the patient was able  to  sleep well overnight.  She stated that she had a little bit of burning  in her chest, although this was very minimal and uncertain if it was  related to eating or not.  The patient is to be discharged and we will  go to our office this morning for a nuclear medicine stress test, which  was set up yesterday to allow her to be discharged today and have the 2-  day stress test begun.  She will then follow up with Dr. Charlton Haws in  the office for continuation of cardiac care and discussion of results of  stress test.   DISCHARGE LABORATORY DATA:  Sodium 139, potassium 3.5, chloride 111, CO2  of 25, BUN 30, creatinine 0.89, glucose 119.  CBC 12.8, hematocrit 37.8,  white blood cells 5.2, platelets 230.  Hemoglobin A1c was 5.7.  Lipids  and LFTs results are pending.  Chest x-ray completed in ER on day of  admission revealed right-sided aortic arch, but no active disease.   DISCHARGE VITAL SIGNS:  Blood pressure 110/67, heart rate 67,  respirations 18, temperature 98, O2 sat 96% on room air.   FOLLOWUP PLANS AND APPOINTMENT:  1. The patient will be discharged this morning and have a 2-day      nuclear medicine stress test in our office and will follow up with      Dr. Charlton Haws thereafter.  Our office will set up that      appointment prior to her leaving the stress test.  2. The patient is to follow up with Dr. Timothy Lasso for continued medical      management.  No new medications have been prescribed at this time.      Discharge medications are same as on admission.  Neurontin 100 mg 2      tablets every bedtime, Lexapro 10 mg daily, and B12 injection      monthly.   ALLERGIES:  PENICILLIN, MORPHINE, and ERYTHROMYCIN.   Time spent with the patient to include physician time, 30 minutes.      Bettey Mare. Lyman Bishop, NP      Noralyn Pick. Eden Emms, MD, [facility]  Electronically Signed    KML/MEDQ  D:  02/04/2008  T:  02/04/2008  Job:  [id number]   cc:   [name], MD

## 2010-07-30 NOTE — Telephone Encounter (Signed)
C/o dry cough Taking antibiotic given by dr Timothy Lasso & robitussin DM  Last seen ? Feb '11 To take delsym OTC tonight, call in am for appt
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
## 2010-07-31 ENCOUNTER — Telehealth: Payer: Self-pay | Admitting: *Deleted

## 2010-07-31 MED ORDER — AZITHROMYCIN 250 MG PO TABS: ORAL_TABLET | ORAL | Status: AC

## 2010-07-31 MED ORDER — PROMETHAZINE-CODEINE 6.25-10 MG/5ML PO SYRP: 5.0000 mL | ORAL_SOLUTION | ORAL | Status: AC | PRN

## 2010-07-31 NOTE — Telephone Encounter (Signed)
No appts this afternoon with CY or TP. Pt is aware but unhappy about this. I spoke with CY regarding the matter and he suggest we call in Phenergan with codeine cough syrup #232ml and Zpak #1 no refills. Pt to keep appt on Friday with CY.  Pharmacy: [facility].   Pt is aware that RX 's has been sent.Diane Barker

## 2010-08-02 ENCOUNTER — Encounter: Payer: Self-pay | Admitting: Internal Medicine

## 2010-08-02 ENCOUNTER — Ambulatory Visit (INDEPENDENT_AMBULATORY_CARE_PROVIDER_SITE_OTHER): Payer: BC Managed Care – PPO | Admitting: Internal Medicine

## 2010-08-02 VITALS — BP 126/80 | HR 68 | Ht 65.5 in | Wt 249.4 lb

## 2010-08-02 DIAGNOSIS — J40 Bronchitis, not specified as acute or chronic: Secondary | ICD-10-CM

## 2010-08-02 MED ORDER — METHYLPREDNISOLONE ACETATE 80 MG/ML IJ SUSP
80.0000 mg | Freq: Once | INTRAMUSCULAR | Status: AC
  Administered 2010-08-02: 80 mg via INTRAMUSCULAR

## 2010-08-02 MED ORDER — LEVALBUTEROL HCL 0.63 MG/3ML IN NEBU
0.6300 mg | INHALATION_SOLUTION | Freq: Once | RESPIRATORY_TRACT | Status: AC
  Administered 2010-08-02: 0.63 mg via RESPIRATORY_TRACT

## 2010-08-02 NOTE — Progress Notes (Signed)
  Subjective:    Patient ID: Diane Barker, female    DOB: [birth year], 60 y.o.   MRN: [id number]  HPI 08/02/10- 58 yoF never smoker followed for bronchitis with hx rhinitis, GERD, hx breast cancer. 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 Woke 2 weeks ago with persistent cough, fever, chilling. Still wakes after night sweat. We got her a cough syrup when her cough began to hurt her back. 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 She describes this as a cold. Dr Timothy Lasso gave bactrim at outset- no help. 2days ago she called [place] for Z pak and cough syrup- day 3. Now slowly better.    Review of Systems See HPI Constitutional:   No weight loss,   chills, fatigue, lassitude. HEENT:   No headaches,  Difficulty swallowing,  Tooth/dental problems,  Sore throat,                No sneezing, itching, ear ache, nasal congestion, post nasal drip,   CV:  No chest pain,  Orthopnea, PND, swelling in lower extremities, anasarca, dizziness, palpitations  GI  No heartburn, indigestion, abdominal pain, nausea, vomiting, diarrhea, change in bowel habits, loss of appetite  Resp: No shortness of breath with exertion or at rest.  No excess mucus, no productive cough,  No non-productive cough,  No coughing up of blood.  No change in color of mucus.  No wheezing.    Skin: no rash or lesions.  GU: no dysuria, change in color of urine, no urgency or frequency.  No flank pain.  MS:  No joint pain or swelling.  No decreased range of motion.  No back pain.  Psych:  No change in mood or affect. No depression or anxiety.  No memory loss.      Objective:   Physical Exam General- Alert, Oriented, Affect-appropriate, Distress- none acute  Skin- rash-none, lesions- none, excoriation- none  Lymphadenopathy- none  Head- atraumatic  Eyes- Gross vision intact, PERRLA, conjunctivae clear secretions  Ears- Hearing, canals, Tm   Nose- Clear, No- Septal dev, mucus,  polyps, erosion, perforation   Throat- Mallampati II , mucosa clear , drainage- none, tonsils- atrophic    Dentures  Neck- flexible , trachea midline, no stridor , thyroid nl, carotid no bruit  Chest - symmetrical excursion , unlabored     Heart/CV- RRR , no murmur , no gallop  , no rub, nl s1 s2                     - JVD- none , edema- none, stasis changes- none, varices- none     Lung- clear to P&A, wheeze- none, dry cough , dullness-none, rub- none     Chest wall-  Abd- tender-no, distended-no, bowel sounds-present, HSM- no  Br/ Gen/ Rectal- Not done, not indicated  Extrem- cyanosis- none, clubbing, none, atrophy- none, strength- nl  Neuro- grossly intact to observation         Assessment & Plan:

## 2010-08-02 NOTE — Patient Instructions (Signed)
Neb xop 0.63  Depo 80  Finish Z pak. Use cough syrup as needed.   Please call as needed.

## 2010-08-02 NOTE — Assessment & Plan Note (Signed)
Acute bronchitis. This may have been viral or allergic to start. I tried to get a hx suggesting reflux and aspiration but can't tell that happened.  She will finish Z pak and cough syrup. We will give neb and depo.

## 2010-08-02 NOTE — Op Note (Signed)
[facility]  Patient:    Diane Barker, Diane Barker Visit Number: [id number] MRN: [id number]          Service Type: DSU Location: [facility] Attending Physician:  Vikki Ports Proc. Date: 11/12/00 Admit Date:  11/12/2000 Discharge Date: 11/12/2000                             Operative Report  This is a repeat dictation  PREOPERATIVE DIAGNOSIS:  History of right breast cancer.  POSTOPERATIVE DIAGNOSIS:  History of right breast cancer.  PROCEDURE:  Excisional right breast biopsy.  SURGEON:  Catalina Lunger, M.D.  ANESTHESIA:  MAC.  DESCRIPTION OF PROCEDURE:  The patient was taken to the operating room and placed in the supine position.  After adequate anesthesia was induced, the right breast was prepped and draped in the normal sterile fashion.  Using an elliptical incision around the previous scar in the central portion, I dissected down onto what appeared to be a very firm fibrotic capsule with seroma fluid within it.  It was excised completely down to normal breast tissue.  It is sent for pathologic evaluation.  Skin was closed with subcuticular 4-0 Monocryl.  Steri-Strips and sterile dressings were applied. The patient tolerated the procedure well and went to PACU in good condition. Attending Physician:  Danna Hefty R. DD:  12/09/00 TD:  12/09/00 Job: [id number]
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
## 2010-08-06 ENCOUNTER — Encounter: Payer: Self-pay | Admitting: Internal Medicine

## 2010-08-20 ENCOUNTER — Telehealth: Payer: Self-pay | Admitting: Internal Medicine

## 2010-08-20 NOTE — Telephone Encounter (Signed)
I had a cancellation for 6/7 called patient back she accepted the appointment.Diane Barker

## 2010-08-22 ENCOUNTER — Encounter: Payer: Self-pay | Admitting: Internal Medicine

## 2010-08-22 ENCOUNTER — Ambulatory Visit (INDEPENDENT_AMBULATORY_CARE_PROVIDER_SITE_OTHER): Payer: BC Managed Care – PPO | Admitting: Internal Medicine

## 2010-08-22 VITALS — BP 110/66 | HR 72 | Ht 65.5 in | Wt 248.4 lb

## 2010-08-22 DIAGNOSIS — J40 Bronchitis, not specified as acute or chronic: Secondary | ICD-10-CM

## 2010-08-22 MED ORDER — METHYLPREDNISOLONE ACETATE 80 MG/ML IJ SUSP
80.0000 mg | Freq: Once | INTRAMUSCULAR | Status: AC
  Administered 2010-08-22: 80 mg via INTRAMUSCULAR

## 2010-08-22 MED ORDER — LEVALBUTEROL HCL 0.63 MG/3ML IN NEBU
0.6300 mg | INHALATION_SOLUTION | Freq: Once | RESPIRATORY_TRACT | Status: AC
  Administered 2010-08-22: 0.63 mg via RESPIRATORY_TRACT

## 2010-08-22 MED ORDER — FLUTICASONE-SALMETEROL 100-50 MCG/DOSE IN AEPB: INHALATION_SPRAY | RESPIRATORY_TRACT | Status: DC

## 2010-08-22 NOTE — Progress Notes (Signed)
Subjective:    Patient ID: Diane Barker, female    DOB: Aug 24, 1950, 60 y.o.   MRN: [id number]  HPI    Review of Systems     Objective:   Physical Exam        Assessment & Plan:   Subjective:    Patient ID: Diane Barker, female    DOB: [birth year], 60 y.o.   MRN: [id number]  HPI 08/02/10- 13 yoF never smoker followed for bronchitis with hx rhinitis, GERD, hx breast cancer. 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 Woke 2 weeks ago with persistent cough, fever, chilling. Still wakes after night sweat. We got her a cough syrup when her cough began to hurt her back. 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 She describes this as a cold. Dr Timothy Lasso gave bactrim at outset- no help. 2days ago she called [place] for Z pak and cough syrup- day 3. Now slowly better.   08/22/10- bronchitis, hx rhinitis, GERD hx breast Ca Cough got better, but for 4 days has had pain over right eye, increased cough, clear mucus. Denies fever, sore throat. 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    Review of Systems See HPI Constitutional:   No weight loss,   chills, fatigue, lassitude. HEENT:   No headaches,  Difficulty swallowing,  Tooth/dental problems,  Sore throat,                No sneezing, itching, ear ache, nasal congestion, post nasal drip,   CV:  No chest pain,  Orthopnea, PND, swelling in lower extremities, anasarca, dizziness, palpitations  GI  No heartburn, indigestion, abdominal pain, nausea, vomiting, diarrhea, change in bowel habits, loss of appetite  Resp: Some shortness of breath with exertion or at rest.  No excess mucus,   No non-productive cough,  No coughing up of blood.  No change in color of mucus. Some wheezing.    Skin: no rash or lesions.  GU: no dysuria, change in color of urine, no urgency or frequency.  No flank pain.  MS:  No joint pain or swelling.  No decreased range of motion.  No back pain.  Psych:  No change in mood or  affect. No depression or anxiety.  No memory loss.      Objective:   Physical Exam General- Alert, Oriented, Affect-appropriate, Distress- none acute  overweight Skin- rash-none, lesions- none, excoriation- none  Lymphadenopathy- none  Head- atraumatic  Eyes- Gross vision intact, PERRLA, conjunctivae clear secretions  Ears- Hearing, canals, Tm - normal  Nose- Clear, No- Septal dev, mucus, polyps, erosion, perforation   sniffing  Throat- Mallampati II , mucosa clear , drainage- none, tonsils- atrophic    Dentures  Neck- flexible , trachea midline, no stridor , thyroid nl, carotid no bruit  Chest - symmetrical excursion , unlabored     Heart/CV- RRR , no murmur , no gallop  , no rub, nl s1 s2                     - JVD- none , edema- none, stasis changes- none, varices- none     Lung- clear to P&A, wheeze- none,   dry Hacking cough , dullness-none, rub- none     Chest wall-  Abd- tender-no, distended-no, bowel sounds-present, HSM- no  Br/ Gen/ Rectal- Not done, not indicated  Extrem- cyanosis- none, clubbing, none, atrophy- none, strength- nl  Neuro- grossly intact to observation  Assessment & Plan:

## 2010-08-22 NOTE — Assessment & Plan Note (Signed)
Recurrent cough but without wheeze or rhonchi. I will try her on Advair and also suggest an antihistamine for occasional use.

## 2010-08-22 NOTE — Patient Instructions (Signed)
Neb xop  Depo 80  Sample x 2, script for Advair 100      1 puff and rise mouth well, twice daily  Also you can try an antihistamine like Claritin/ loratadine  1 daily as needed to stop the postnasal drip.

## 2010-08-27 ENCOUNTER — Encounter: Payer: Self-pay | Admitting: Internal Medicine

## 2010-08-30 ENCOUNTER — Telehealth: Payer: Self-pay | Admitting: Internal Medicine

## 2010-08-30 MED ORDER — HYDROCODONE-HOMATROPINE 5-1.5 MG/5ML PO SYRP: 5.0000 mL | ORAL_SOLUTION | Freq: Four times a day (QID) | ORAL | Status: AC | PRN

## 2010-08-30 NOTE — Telephone Encounter (Signed)
[email or web address]  

## 2010-08-30 NOTE — Telephone Encounter (Signed)
Per CY-ok to give Hyrdomet cough syrup #258ml 1 tsp every 6 hours prn cough no refills. Rx called to pharmacy and pt aware.

## 2010-08-30 NOTE — Telephone Encounter (Signed)
Spoke with patient-states that she is continuing to cough(deep and gags)Advair not helping. Throat has started to hurt from coughing so much. Please advise.

## 2010-11-22 ENCOUNTER — Ambulatory Visit: Payer: BC Managed Care – PPO | Admitting: Internal Medicine

## 2010-12-17 LAB — CBC
HCT: 37.8
Hemoglobin: 12.8
MCHC: 33.8
RBC: 3.86 — ABNORMAL LOW
RDW: 13

## 2010-12-17 LAB — CARDIAC PANEL(CRET KIN+CKTOT+MB+TROPI)
CK, MB: 1.3
Relative Index: 1
Total CK: 103
Total CK: 125
Troponin I: <0.01

## 2010-12-17 LAB — BASIC METABOLIC PANEL
CO2: 25
CO2: 28
Calcium: 8.5
Chloride: 111
Creatinine, Ser: 0.89
GFR calc Af Amer: >60
Glucose, Bld: 119 — ABNORMAL HIGH
Glucose, Bld: 90
Potassium: 3.7
Sodium: 139

## 2010-12-17 LAB — POCT CARDIAC MARKERS

## 2010-12-26 LAB — I-STAT 8, (EC8 V) (CONVERTED LAB)
Bicarbonate: 23.6
HCT: 40
Hemoglobin: 13.6
Operator id: 294501
Sodium: 140
TCO2: 25

## 2010-12-26 LAB — URINALYSIS, ROUTINE W REFLEX MICROSCOPIC
Bilirubin Urine: NEGATIVE
Hgb urine dipstick: NEGATIVE
Ketones, ur: NEGATIVE
Nitrite: NEGATIVE
Protein, ur: NEGATIVE
Specific Gravity, Urine: 1.029
Urobilinogen, UA: 1
Urobilinogen, UA: 1
pH: 5

## 2010-12-26 LAB — COMPREHENSIVE METABOLIC PANEL
Alkaline Phosphatase: 69
BUN: 20
Calcium: 9
Glucose, Bld: 110 — ABNORMAL HIGH
Total Protein: 6.4

## 2010-12-26 LAB — POCT I-STAT CREATININE: Operator id: 294501

## 2010-12-26 LAB — URINE MICROSCOPIC-ADD ON

## 2010-12-26 LAB — DIFFERENTIAL
Basophils Relative: 0
Eosinophils Absolute: 0.1
Monocytes Relative: 8
Neutrophils Relative %: 66

## 2010-12-26 LAB — CBC
MCHC: 34.3
MCV: 96.4
RBC: 3.93
RDW: 12.8

## 2010-12-26 LAB — LIPASE, BLOOD: Lipase: 17

## 2011-02-05 ENCOUNTER — Ambulatory Visit (INDEPENDENT_AMBULATORY_CARE_PROVIDER_SITE_OTHER): Payer: BC Managed Care – PPO | Admitting: Internal Medicine

## 2011-02-05 ENCOUNTER — Encounter: Payer: Self-pay | Admitting: Internal Medicine

## 2011-02-05 VITALS — BP 118/68 | HR 60 | Ht 65.5 in | Wt 246.2 lb

## 2011-02-05 DIAGNOSIS — J310 Chronic rhinitis: Secondary | ICD-10-CM

## 2011-02-05 DIAGNOSIS — J40 Bronchitis, not specified as acute or chronic: Secondary | ICD-10-CM

## 2011-02-05 DIAGNOSIS — Z23 Encounter for immunization: Secondary | ICD-10-CM

## 2011-02-05 MED ORDER — LEVALBUTEROL HCL 0.63 MG/3ML IN NEBU
0.6300 mg | INHALATION_SOLUTION | RESPIRATORY_TRACT | Status: AC
  Administered 2011-02-05: 0.63 mg via RESPIRATORY_TRACT

## 2011-02-05 MED ORDER — METHYLPREDNISOLONE ACETATE 80 MG/ML IJ SUSP
80.0000 mg | Freq: Once | INTRAMUSCULAR | Status: AC
  Administered 2011-02-05: 80 mg via INTRAMUSCULAR

## 2011-02-05 MED ORDER — AZITHROMYCIN 250 MG PO TABS: ORAL_TABLET | ORAL | Status: AC

## 2011-02-05 NOTE — Progress Notes (Signed)
Patient ID: Diane Barker, female    DOB: 10-23-1950, 60 y.o.   MRN: [id number]  HPI 08/02/10- 71 yoF never smoker followed for bronchitis with hx rhinitis, GERD, hx breast cancer. 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 Woke 2 weeks ago with persistent cough, fever, chilling. Still wakes after night sweat. We got her a cough syrup when her cough began to hurt her back. 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 She describes this as a cold. Dr Timothy Lasso gave bactrim at outset- no help. 2days ago she called [place] for Z pak and cough syrup- day 3. Now slowly better.   08/22/10- bronchitis, hx rhinitis, GERD hx breast Ca Cough got better, but for 4 days has had pain over right eye, increased cough, clear mucus. Denies fever, sore throat. 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   02/05/11-  59 yoF never smoker followed for bronchitis with hx rhinitis, GERD, hx breast cancer. Declines flu vaccine. Advair sample did help but she didn't refill it. In the last several days she has had increased cough and drainage, sneezing, possibly an early cold. Had some chills this morning but no fever and nothing purulent.  Review of Systems See HPI Constitutional:   No-   weight loss, night sweats, fevers, chills, fatigue, lassitude. HEENT:   No-  headaches, difficulty swallowing, tooth/dental problems, sore throat,       +  sneezing, itching, ear ache, nasal congestion, post nasal drip,  CV:  No-   chest pain, orthopnea, PND, swelling in lower extremities, anasarca,                                  dizziness, palpitations Resp: No-   shortness of breath with exertion or at rest.              +  productive cough,  No non-productive cough,  No- coughing up of blood.              No-   change in color of mucus.  No- wheezing.   Skin: No-   rash or lesions. GI:  No-   heartburn, indigestion, abdominal pain, nausea, vomiting, diarrhea,                 change in bowel  habits, loss of appetite GU: No-   dysuria, change in color of urine, no urgency or frequency.  No- flank pain. MS:  No-   joint pain or swelling.  No- decreased range of motion.  No- back pain. Neuro-     nothing unusual Psych:  No- change in mood or affect. No depression or anxiety.  No memory loss.  Objective:   Physical Exam General- Alert, Oriented, Affect-appropriate, Distress- none acute, overweight Skin- rash-none, lesions- none, excoriation- none Lymphadenopathy- none Head- atraumatic            Eyes- Gross vision intact, PERRLA, conjunctivae clear secretions            Ears- Hearing, canals-normal            Nose- +wet sniffing, no-Septal dev, mucus, polyps, erosion, perforation             Throat- Mallampati II , mucosa clear , drainage- none, tonsils- atrophic, dentures Neck- flexible , trachea midline, no stridor , thyroid nl, carotid no bruit Chest - symmetrical excursion , unlabored  Heart/CV- RRR , no murmur , no gallop  , no rub, nl s1 s2                           - JVD- none , edema- none, stasis changes- none, varices- none           Lung- +light cough w/o wheeze , dullness-none, rub- none           Chest wall-  Abd- tender-no, distended-no, bowel sounds-present, HSM- no Br/ Gen/ Rectal- Not done, not indicated Extrem- cyanosis- none, clubbing, none, atrophy- none, strength- nl Neuro- grossly intact to observation

## 2011-02-05 NOTE — Patient Instructions (Signed)
Neb xop 0.63  Depo 80   Script to hold for Z pak  Keep follow-up appointment in May, unless needed sooner

## 2011-02-08 NOTE — Assessment & Plan Note (Signed)
Upper respiratory infection with bronchitis. This is probably a viral syndrome best managed with supportive care but she is wheezing more. Plan-Z-Pak to hold. Nebulizer treatment and steroid shot today.

## 2011-02-11 ENCOUNTER — Telehealth: Payer: Self-pay | Admitting: *Deleted

## 2011-02-11 NOTE — Telephone Encounter (Signed)
Pt is using Advair and cough syrup as told to use.

## 2011-02-11 NOTE — Telephone Encounter (Signed)
Per CY-yes start taking the abx given at OV.   Pt is aware of this.

## 2011-02-28 ENCOUNTER — Ambulatory Visit: Payer: BC Managed Care – PPO | Admitting: Internal Medicine

## 2011-02-28 ENCOUNTER — Ambulatory Visit (INDEPENDENT_AMBULATORY_CARE_PROVIDER_SITE_OTHER): Payer: BC Managed Care – PPO | Admitting: Internal Medicine

## 2011-02-28 ENCOUNTER — Encounter: Payer: Self-pay | Admitting: Internal Medicine

## 2011-02-28 VITALS — BP 130/72 | HR 83 | Temp 98.1°F | Ht 65.5 in | Wt 242.8 lb

## 2011-02-28 DIAGNOSIS — J40 Bronchitis, not specified as acute or chronic: Secondary | ICD-10-CM

## 2011-02-28 MED ORDER — HYDROCOD POLST-CHLORPHEN POLST 10-8 MG/5ML PO LQCR: 5.0000 mL | Freq: Two times a day (BID) | ORAL | Status: DC | PRN

## 2011-02-28 MED ORDER — LEVALBUTEROL HCL 0.63 MG/3ML IN NEBU
0.6300 mg | INHALATION_SOLUTION | Freq: Once | RESPIRATORY_TRACT | Status: AC
  Administered 2011-02-28: 0.63 mg via RESPIRATORY_TRACT

## 2011-02-28 MED ORDER — OSELTAMIVIR PHOSPHATE 75 MG PO CAPS: 75.0000 mg | ORAL_CAPSULE | Freq: Two times a day (BID) | ORAL | Status: AC

## 2011-02-28 MED ORDER — METHYLPREDNISOLONE ACETATE 80 MG/ML IJ SUSP
80.0000 mg | Freq: Once | INTRAMUSCULAR | Status: AC
  Administered 2011-02-28: 80 mg via INTRAMUSCULAR

## 2011-02-28 NOTE — Patient Instructions (Signed)
Neb xop 0.63  Depo 80  Script for Tamiflu  Script for Tussionex cough syrup  Lots of fluids

## 2011-02-28 NOTE — Progress Notes (Signed)
Patient ID: Diane Barker, female    DOB: 1950-06-13, 60 y.o.   MRN: [id number]  HPI 08/02/10- 60 yoF never smoker followed for bronchitis with hx rhinitis, GERD, hx breast cancer. 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 Woke 2 weeks ago with persistent cough, fever, chilling. Still wakes after night sweat. We got her a cough syrup when her cough began to hurt her back. 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 She describes this as a cold. Dr Timothy Lasso gave bactrim at outset- no help. 2days ago she called [place] for Z pak and cough syrup- day 3. Now slowly better.   08/22/10- bronchitis, hx rhinitis, GERD hx breast Ca Cough got better, but for 4 days has had pain over right eye, increased cough, clear mucus. Denies fever, sore throat. 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   02/05/11-  60 yoF never smoker followed for bronchitis with hx rhinitis, GERD, hx breast cancer. Declines flu vaccine. Advair sample did help but she didn't refill it. In the last several days she has had increased cough and drainage, sneezing, possibly an early cold. Had some chills this morning but no fever and nothing purulent.  02/28/11-  60 yoF never smoker followed for bronchitis with hx rhinitis, GERD, hx breast cancer. Has declined flu shot. Now presents with acute illness including sore throat global headache, constant hard cough until she retches and can't sleep. Denies fever, nausea, myalgias.  Review of Systems See HPI Constitutional:   No-   weight loss, night sweats, fevers, chills, fatigue, lassitude. HEENT:   +  headaches, No-difficulty swallowing, tooth/dental problems, +sore throat,       +  sneezing, itching, ear ache, nasal congestion, post nasal drip,  CV:  No-   chest pain, orthopnea, PND, swelling in lower extremities, anasarca,                                  dizziness, palpitations Resp: No-   shortness of breath with exertion or at rest.      +  productive cough,  + non-productive cough,  No- coughing up of blood.              No-   change in color of mucus.  No- wheezing.   Skin: No-   rash or lesions. GI:  No-   heartburn, indigestion, abdominal pain, nausea, vomiting, diarrhea,                 change in bowel habits, loss of appetite GU: MS:  No-   joint pain or swelling.  No- decreased range of motion.  No- back pain. Neuro-     nothing unusual Psych:  No- change in mood or affect. No depression or anxiety.  No memory loss.  Objective:   Physical Exam General- Alert, Oriented, Affect-appropriate, Distress- none acute, overweight Skin- rash-none, lesions- none, excoriation- none Lymphadenopathy- none Head- atraumatic            Eyes- Gross vision intact, PERRLA, conjunctivae clear secretions            Ears- Hearing, canals-normal            Nose- +wet sniffing, no-Septal dev, mucus, polyps, erosion, perforation             Throat- Mallampati II , mucosa clear , drainage- none, tonsils- atrophic, dentures Neck- flexible ,  trachea midline, no stridor , thyroid nl, carotid no bruit Chest - symmetrical excursion , unlabored           Heart/CV- RRR , no murmur , no gallop  , no rub, nl s1 s2                           - JVD- none , edema- none, stasis changes- none, varices- none           Lung- +harsh cough w/o wheeze , dullness-none, rub- none           Chest wall-  Abd- tender-no, distended-no, bowel sounds-present, HSM- no Br/ Gen/ Rectal- Not done, not indicated Extrem- cyanosis- none, clubbing, none, atrophy- none, strength- nl Neuro- grossly intact to observation

## 2011-03-02 NOTE — Assessment & Plan Note (Signed)
Acute flulike illness which may well be influenza. She indicates willingness to get flu shot next year. Plan-nebulizer with Xopenex Depo-Medrol Tussionex, Tamiflu, fluids and comfort measures.

## 2011-03-03 ENCOUNTER — Encounter: Payer: Self-pay | Admitting: *Deleted

## 2011-03-14 ENCOUNTER — Ambulatory Visit: Payer: BC Managed Care – PPO

## 2011-03-14 ENCOUNTER — Telehealth: Payer: Self-pay | Admitting: *Deleted

## 2011-03-14 NOTE — Telephone Encounter (Signed)
Patient called back stating she doesn't need her cough syrup refilled; she found she had about 1/4 bottle left and will use this for now.

## 2011-03-14 NOTE — Telephone Encounter (Signed)
Pt still having cough, non productive most of the time.  Wants refill of the tussionex.  [facility].   Cy please advise if ok to refill.   thanks

## 2011-03-31 ENCOUNTER — Ambulatory Visit
Admission: RE | Admit: 2011-03-31 | Discharge: 2011-03-31 | Disposition: A | Discharge status: Home / Self Care | Payer: BC Managed Care – PPO | Source: Ambulatory Visit | Attending: Hematology and Oncology | Admitting: Hematology and Oncology

## 2011-03-31 DIAGNOSIS — Z1231 Encounter for screening mammogram for malignant neoplasm of breast: Secondary | ICD-10-CM

## 2011-05-31 ENCOUNTER — Emergency Department (INDEPENDENT_AMBULATORY_CARE_PROVIDER_SITE_OTHER)
Admission: EM | Admit: 2011-05-31 | Discharge: 2011-05-31 | Disposition: A | Discharge status: Home / Self Care | Payer: BC Managed Care – PPO | Source: Home / Self Care | Attending: Emergency Medicine | Admitting: Emergency Medicine

## 2011-05-31 ENCOUNTER — Encounter (HOSPITAL_COMMUNITY): Payer: Self-pay | Admitting: Emergency Medicine

## 2011-05-31 DIAGNOSIS — J40 Bronchitis, not specified as acute or chronic: Secondary | ICD-10-CM

## 2011-05-31 MED ORDER — GUAIFENESIN-CODEINE 100-10 MG/5ML PO SYRP: 5.0000 mL | ORAL_SOLUTION | Freq: Three times a day (TID) | ORAL | Status: AC | PRN

## 2011-05-31 MED ORDER — DOXYCYCLINE HYCLATE 100 MG PO CAPS: 100.0000 mg | ORAL_CAPSULE | Freq: Two times a day (BID) | ORAL | Status: DC

## 2011-05-31 MED ORDER — PREDNISONE 20 MG PO TABS: 40.0000 mg | ORAL_TABLET | Freq: Every day | ORAL | Status: DC

## 2011-05-31 NOTE — ED Provider Notes (Signed)
History     CSN: [id number]  Arrival date & time 05/31/11  1029   First MD Initiated Contact with Patient 05/31/11 1032      Chief Complaint  Patient presents with  . Cough    (Consider location/radiation/quality/duration/timing/severity/associated sxs/prior treatment) HPI Comments: It's been 6 days and the cough is worsening and feel like my chest is becoming congestion as it has done in the past". I get bronchitis once or twice a year by Dr. treatment with shots and antibiotics. I feel this is becoming another episode as might coughing is worsening and less he was feeling chills"  Patient denies any shortness of breath and said initially did experience 2 episodes of vomiting and loose stools the beginning of the week around Tuesday.  Patient is a 61 y.o. female presenting with cough. The history is provided by the patient.  Cough This is a new problem. The current episode started more than 2 days ago. The problem occurs constantly. The problem has been gradually worsening. The cough is non-productive. Associated symptoms include chills. Pertinent negatives include no ear congestion, no ear pain, no headaches, no shortness of breath and no wheezing. She is not a smoker. Her past medical history is significant for bronchitis and asthma.    Past Medical History  Diagnosis Date  . Breast cancer 2000    Rt lumpectomy, Chemo/XRT/rt axillary node   . Depression   . Low back pain     Past Surgical History  Procedure Date  . Tubal ligation   . Breast lumpectomy     for CA txed w/ chemo and radiation  . Umbilical hernia repair     Family History  Problem Relation Age of Onset  . Asthma Mother   . COPD Mother   . Lung cancer Father   . Asthma Daughter   . Kidney failure Brother   . Diabetes Brother   . Heart attack Brother   . Heart attack Mother     History  Substance Use Topics  . Smoking status: Never Smoker   . Smokeless tobacco: Not on file  . Alcohol Use: No     OB History    Grav Para Term Preterm Abortions TAB SAB Ect Mult Living                  Review of Systems  Constitutional: Positive for chills. Negative for fever and activity change.  HENT: Negative for ear pain.   Respiratory: Positive for cough. Negative for shortness of breath and wheezing.   Neurological: Negative for headaches.    Allergies  Amoxicillin; Erythromycin; Morphine; and Penicillins  Home Medications   Current Outpatient Rx  Name Route Sig Dispense Refill  . GABAPENTIN 600 MG PO TABS Oral Take by mouth 2 (two) times daily.    Marland Kitchen HYDROCOD POLST-CPM POLST ER 10-8 MG/5ML PO LQCR Oral Take 5 mLs by mouth every 12 (twelve) hours as needed. 140 mL 0  . DOXYCYCLINE HYCLATE 100 MG PO CAPS Oral Take 1 capsule (100 mg total) by mouth 2 (two) times daily. 20 capsule 0  . FLUTICASONE-SALMETEROL 100-50 MCG/DOSE IN AEPB  1 puff and rinse mouth well, twice daily 1 each prn  . GUAIFENESIN-CODEINE 100-10 MG/5ML PO SYRP Oral Take 5 mLs by mouth 3 (three) times daily as needed for cough. 120 mL 0  . PREDNISONE 20 MG PO TABS Oral Take 2 tablets (40 mg total) by mouth daily. 2 tablets daily for 5 days 10 tablet 0  .  PROMETHAZINE-CODEINE 6.25-10 MG/5ML PO SYRP Oral Take 5 mLs by mouth At bedtime.      BP 127/68  Pulse 77  Temp(Src) 98.9 F (37.2 C) (Oral)  Resp 17  SpO2 99%  Physical Exam  Nursing note and vitals reviewed. Constitutional: She appears well-developed and well-nourished. No distress.  HENT:  Head: Normocephalic.  Mouth/Throat: Uvula is midline, oropharynx is clear and moist and mucous membranes are normal.  Eyes: Conjunctivae are normal.  Neck: Normal range of motion. Neck supple. No JVD present.  Cardiovascular: Normal rate and regular rhythm.   Pulmonary/Chest: Effort normal. No respiratory distress. She has no decreased breath sounds. She has no wheezes. She has no rhonchi. She has no rales. She exhibits no tenderness.  Abdominal: Soft.   Lymphadenopathy:    She has no cervical adenopathy.  Skin: She is not diaphoretic.    ED Course  Procedures (including critical care time)  Labs Reviewed - No data to display No results found.   1. BRONCHITIS       MDM  Patient with history of bronchitis (nonsmoker) presents urgent care with respiratory symptoms for 6 days and worsening discussed symptomatic management for the first 48-72 hours and if no improvement or worsening symptoms start with provided antibiotic or alternatively to followup as well as her primary care Dr. patient and agree with plan of care and will pursue symptomatic management initially        Jimmie Molly, MD 05/31/11 1122

## 2011-05-31 NOTE — ED Notes (Signed)
Pt states that the cough started Tuesday evening.  Pt saw Dr. Timothy Lasso, MD on Wednesday for vomiting that began Monday; Tuesday morning loose stool began. Last time vomited was Monday. Last time loose stool Tuesday.

## 2011-05-31 NOTE — Discharge Instructions (Signed)
  Your current symptoms and exam were consistent with  bronchial congestion. Probably related to a viral process have recommended you take the first 2 medicines and avoid the antibiotic as her symptoms might improve without need of taking. Take plenty of fluids and perhaps use a humidifier at night to keep her upper airway is moist.   Bronchitis Bronchitis is a problem of the air tubes leading to your lungs. This problem makes it hard for air to get in and out of the lungs. You may cough a lot because your air tubes are narrow. Going without care can cause lasting (chronic) bronchitis. HOME CARE   Drink enough fluids to keep your pee (urine) clear or pale yellow.   Use a cool mist humidifier.   Quit smoking if you smoke. If you keep smoking, the bronchitis might not get better.   Only take medicine as told by your doctor.  GET HELP RIGHT AWAY IF:   Coughing keeps you awake.   You start to wheeze.   You become more sick or weak.   You have a hard time breathing or get short of breath.   You cough up blood.   Coughing lasts more than 2 weeks.   You have a fever.   Your baby is older than 3 months with a rectal temperature of 102 F (38.9 C) or higher.   Your baby is 37 months old or younger with a rectal temperature of 100.4 F (38 C) or higher.  MAKE SURE YOU:  Understand these instructions.   Will watch your condition.   Will get help right away if you are not doing well or get worse.  Document Released: 08/20/2007 Document Revised: 02/20/2011 Document Reviewed: 02/02/2009 [facility] Patient Information 2012 [place], [place].

## 2011-06-06 ENCOUNTER — Encounter (HOSPITAL_COMMUNITY): Payer: Self-pay | Admitting: Emergency Medicine

## 2011-06-06 ENCOUNTER — Emergency Department (HOSPITAL_COMMUNITY)
Admission: EM | Admit: 2011-06-06 | Discharge: 2011-06-06 | Disposition: A | Discharge status: Home / Self Care | Payer: BC Managed Care – PPO | Attending: Emergency Medicine | Admitting: Emergency Medicine

## 2011-06-06 ENCOUNTER — Emergency Department (HOSPITAL_COMMUNITY): Payer: BC Managed Care – PPO

## 2011-06-06 DIAGNOSIS — M542 Cervicalgia: Secondary | ICD-10-CM | POA: Insufficient documentation

## 2011-06-06 DIAGNOSIS — X58XXXA Exposure to other specified factors, initial encounter: Secondary | ICD-10-CM | POA: Insufficient documentation

## 2011-06-06 DIAGNOSIS — M436 Torticollis: Secondary | ICD-10-CM

## 2011-06-06 DIAGNOSIS — S139XXA Sprain of joints and ligaments of unspecified parts of neck, initial encounter: Secondary | ICD-10-CM | POA: Insufficient documentation

## 2011-06-06 DIAGNOSIS — Z853 Personal history of malignant neoplasm of breast: Secondary | ICD-10-CM | POA: Insufficient documentation

## 2011-06-06 DIAGNOSIS — R51 Headache: Secondary | ICD-10-CM | POA: Insufficient documentation

## 2011-06-06 DIAGNOSIS — S161XXA Strain of muscle, fascia and tendon at neck level, initial encounter: Secondary | ICD-10-CM

## 2011-06-06 DIAGNOSIS — R112 Nausea with vomiting, unspecified: Secondary | ICD-10-CM | POA: Insufficient documentation

## 2011-06-06 MED ORDER — HYDROCODONE-ACETAMINOPHEN 5-325 MG PO TABS: 2.0000 | ORAL_TABLET | ORAL | Status: AC | PRN

## 2011-06-06 MED ORDER — IBUPROFEN 600 MG PO TABS: 600.0000 mg | ORAL_TABLET | Freq: Three times a day (TID) | ORAL | Status: AC | PRN

## 2011-06-06 MED ORDER — HYDROCODONE-ACETAMINOPHEN 5-325 MG PO TABS
2.0000 | ORAL_TABLET | Freq: Once | ORAL | Status: AC
  Administered 2011-06-06: 2 via ORAL
  Filled 2011-06-06: qty 2

## 2011-06-06 MED ORDER — DIAZEPAM 5 MG PO TABS
5.0000 mg | ORAL_TABLET | Freq: Once | ORAL | Status: AC
  Administered 2011-06-06: 5 mg via ORAL
  Filled 2011-06-06: qty 1

## 2011-06-06 MED ORDER — IBUPROFEN 200 MG PO TABS
600.0000 mg | ORAL_TABLET | Freq: Once | ORAL | Status: AC
  Administered 2011-06-06: 600 mg via ORAL
  Filled 2011-06-06: qty 3

## 2011-06-06 MED ORDER — DIAZEPAM 5 MG PO TABS: 5.0000 mg | ORAL_TABLET | Freq: Four times a day (QID) | ORAL | Status: AC | PRN

## 2011-06-06 NOTE — ED Notes (Signed)
Rx given x3 D/c instructions reviewed w/ pt - pt denies any further questions or concerns at present.    

## 2011-06-06 NOTE — Discharge Instructions (Signed)
Cervical Strain Care After A cervical strain is when the muscles and ligaments in your neck have been stretched. The bones are not broken. If you had any problems moving your arms or legs immediately after the injury, even if the problem has gone away, make sure to tell this to your caregiver.  HOME CARE INSTRUCTIONS   While awake, apply ice packs to the neck or areas of pain about every 1 to 2 hours, for 15 to 20 minutes at a time. Do this for 2 days. If you were given a cervical collar for support, ask your caregiver if you may remove it for bathing or applying ice.   If given a cervical collar, wear as instructed. Do not remove any collar unless instructed by a caregiver.   Only take over-the-counter or prescription medicines for pain, discomfort, or fever as directed by your caregiver.  Recheck with the hospital or clinic after a radiologist has read your X-rays. Recheck with the hospital or clinic to make sure the initial readings are correct. Do this also to determine if you need further studies. It is your responsibility to find out your X-ray results. X-rays are sometimes repeated in one week to ten days. These are often repeated to make sure that a hairline fracture was not overlooked. Ask your caregiver how you are to find out about your radiology (X-ray) results. SEEK IMMEDIATE MEDICAL CARE IF:   You have increasing pain in your neck.   You develop difficulties swallowing or breathing.   You have numbness, weakness, or movement problems in the arms or legs.   You have difficulty walking.   You develop bowel or bladder retention or incontinence.   You have problems with walking.  MAKE SURE YOU:   Understand these instructions.   Will watch your condition.   Will get help right away if you are not doing well or get worse.  Document Released: 03/03/2005 Document Revised: 11/13/2010 Document Reviewed: 10/15/2007 [facility] Patient Information 2012 [place], [place].Cervical  Sprain A cervical sprain is an injury in the neck in which the ligaments are stretched or torn. The ligaments are the tissues that hold the bones of the neck (vertebrae) in place.Cervical sprains can range from very mild to very severe. Most cervical sprains get better in 1 to 3 weeks, but it depends on the cause and extent of the injury. Severe cervical sprains can cause the neck vertebrae to be unstable. This can lead to damage of the spinal cord and can result in serious nervous system problems. Your caregiver will determine whether your cervical sprain is mild or severe. CAUSES  Severe cervical sprains may be caused by:  Contact sport injuries (football, rugby, wrestling, hockey, auto racing, gymnastics, diving, martial arts, boxing).   Motor vehicle collisions.   Whiplash injuries. This means the neck is forcefully whipped backward and forward.   Falls.  Mild cervical sprains may be caused by:   Awkward positions, such as cradling a telephone between your ear and shoulder.   Sitting in a chair that does not offer proper support.   Working at a poorly Marketing executive station.   Activities that require looking up or down for long periods of time.  SYMPTOMS   Pain, soreness, stiffness, or a burning sensation in the front, back, or sides of the neck. This discomfort may develop immediately after injury or it may develop slowly and not begin for 24 hours or more after an injury.   Pain or tenderness directly in the  middle of the back of the neck.   Shoulder or upper back pain.   Limited ability to move the neck.   Headache.   Dizziness.   Weakness, numbness, or tingling in the hands or arms.   Muscle spasms.   Difficulty swallowing or chewing.   Tenderness and swelling of the neck.  DIAGNOSIS  Most of the time, your caregiver can diagnose this problem by taking your history and doing a physical exam. Your caregiver will ask about any known problems, such as arthritis in  the neck or a previous neck injury. X-rays may be taken to find out if there are any other problems, such as problems with the bones of the neck. However, an X-ray often does not reveal the full extent of a cervical sprain. Other tests such as a computed tomography (CT) scan or magnetic resonance imaging (MRI) may be needed. TREATMENT  Treatment depends on the severity of the cervical sprain. Mild sprains can be treated with rest, keeping the neck in place (immobilization), and pain medicines. Severe cervical sprains need immediate immobilization and an appointment with an orthopedist or neurosurgeon. Several treatment options are available to help with pain, muscle spasms, and other symptoms. Your caregiver may prescribe:  Medicines, such as pain relievers, numbing medicines, or muscle relaxants.   Physical therapy. This can include stretching exercises, strengthening exercises, and posture training. Exercises and improved posture can help stabilize the neck, strengthen muscles, and help stop symptoms from returning.   A neck collar to be worn for short periods of time. Often, these collars are worn for comfort. However, certain collars may be worn to protect the neck and prevent further worsening of a serious cervical sprain.  HOME CARE INSTRUCTIONS   Put ice on the injured area.   Put ice in a plastic bag.   Place a towel between your skin and the bag.   Leave the ice on for 15 to 20 minutes, 3 to 4 times a day.   Only take over-the-counter or prescription medicines for pain, discomfort, or fever as directed by your caregiver.   Keep all follow-up appointments as directed by your caregiver.   Keep all physical therapy appointments as directed by your caregiver.   If a neck collar is prescribed, wear it as directed by your caregiver.   Do not drive while wearing a neck collar.   Make any needed adjustments to your work station to promote good posture.   Avoid positions and activities  that make your symptoms worse.   Warm up and stretch before being active to help prevent problems.  SEEK MEDICAL CARE IF:   Your pain is not controlled with medicine.   You are unable to decrease your pain medicine over time as planned.   Your activity level is not improving as expected.  SEEK IMMEDIATE MEDICAL CARE IF:   You develop any bleeding, stomach upset, or signs of an allergic reaction to your medicine.   Your symptoms get worse.   You develop new, unexplained symptoms.   You have numbness, tingling, weakness, or paralysis in any part of your body.  MAKE SURE YOU:   Understand these instructions.   Will watch your condition.   Will get help right away if you are not doing well or get worse.  Document Released: 12/29/2006 Document Revised: 02/20/2011 Document Reviewed: 12/04/2010 [facility] Patient Information 2012 [place], [place].Cervical Sprain A cervical sprain is an injury in the neck in which the ligaments are stretched or torn. The  ligaments are the tissues that hold the bones of the neck (vertebrae) in place.Cervical sprains can range from very mild to very severe. Most cervical sprains get better in 1 to 3 weeks, but it depends on the cause and extent of the injury. Severe cervical sprains can cause the neck vertebrae to be unstable. This can lead to damage of the spinal cord and can result in serious nervous system problems. Your caregiver will determine whether your cervical sprain is mild or severe. CAUSES  Severe cervical sprains may be caused by:  Contact sport injuries (football, rugby, wrestling, hockey, auto racing, gymnastics, diving, martial arts, boxing).   Motor vehicle collisions.   Whiplash injuries. This means the neck is forcefully whipped backward and forward.   Falls.  Mild cervical sprains may be caused by:   Awkward positions, such as cradling a telephone between your ear and shoulder.   Sitting in a chair that does not offer proper  support.   Working at a poorly Marketing executive station.   Activities that require looking up or down for long periods of time.  SYMPTOMS   Pain, soreness, stiffness, or a burning sensation in the front, back, or sides of the neck. This discomfort may develop immediately after injury or it may develop slowly and not begin for 24 hours or more after an injury.   Pain or tenderness directly in the middle of the back of the neck.   Shoulder or upper back pain.   Limited ability to move the neck.   Headache.   Dizziness.   Weakness, numbness, or tingling in the hands or arms.   Muscle spasms.   Difficulty swallowing or chewing.   Tenderness and swelling of the neck.  DIAGNOSIS  Most of the time, your caregiver can diagnose this problem by taking your history and doing a physical exam. Your caregiver will ask about any known problems, such as arthritis in the neck or a previous neck injury. X-rays may be taken to find out if there are any other problems, such as problems with the bones of the neck. However, an X-ray often does not reveal the full extent of a cervical sprain. Other tests such as a computed tomography (CT) scan or magnetic resonance imaging (MRI) may be needed. TREATMENT  Treatment depends on the severity of the cervical sprain. Mild sprains can be treated with rest, keeping the neck in place (immobilization), and pain medicines. Severe cervical sprains need immediate immobilization and an appointment with an orthopedist or neurosurgeon. Several treatment options are available to help with pain, muscle spasms, and other symptoms. Your caregiver may prescribe:  Medicines, such as pain relievers, numbing medicines, or muscle relaxants.   Physical therapy. This can include stretching exercises, strengthening exercises, and posture training. Exercises and improved posture can help stabilize the neck, strengthen muscles, and help stop symptoms from returning.   A neck collar  to be worn for short periods of time. Often, these collars are worn for comfort. However, certain collars may be worn to protect the neck and prevent further worsening of a serious cervical sprain.  HOME CARE INSTRUCTIONS   Put ice on the injured area.   Put ice in a plastic bag.   Place a towel between your skin and the bag.   Leave the ice on for 15 to 20 minutes, 3 to 4 times a day.   Only take over-the-counter or prescription medicines for pain, discomfort, or fever as directed by your caregiver.   Keep  all follow-up appointments as directed by your caregiver.   Keep all physical therapy appointments as directed by your caregiver.   If a neck collar is prescribed, wear it as directed by your caregiver.   Do not drive while wearing a neck collar.   Make any needed adjustments to your work station to promote good posture.   Avoid positions and activities that make your symptoms worse.   Warm up and stretch before being active to help prevent problems.  SEEK MEDICAL CARE IF:   Your pain is not controlled with medicine.   You are unable to decrease your pain medicine over time as planned.   Your activity level is not improving as expected.  SEEK IMMEDIATE MEDICAL CARE IF:   You develop any bleeding, stomach upset, or signs of an allergic reaction to your medicine.   Your symptoms get worse.   You develop new, unexplained symptoms.   You have numbness, tingling, weakness, or paralysis in any part of your body.  MAKE SURE YOU:   Understand these instructions.   Will watch your condition.   Will get help right away if you are not doing well or get worse.  Document Released: 12/29/2006 Document Revised: 02/20/2011 Document Reviewed: 12/04/2010 [employer] Patient Information 2012 [employer], LTorticollis, Acute You have suddenly (acutely) developed a twisted neck (torticollis). This is usually a self-limited condition. CAUSES  Acute torticollis may be caused by  malposition, trauma or infection. Most commonly, acute torticollis is caused by sleeping in an awkward position. Torticollis may also be caused by the flexion, extension or twisting of the neck muscles beyond their normal position. Sometimes, the exact cause may not be known. SYMPTOMS  Usually, there is pain and limited movement of the neck. Your neck may twist to one side. DIAGNOSIS  The diagnosis is often made by physical examination. X-rays, CT scans or MRIs may be done if there is a history of trauma or concern of infection. TREATMENT  For a common, stiff neck that develops during sleep, treatment is focused on relaxing the contracted neck muscle. Medications (including shots) may be used to treat the problem. Most cases resolve in several days. Torticollis usually responds to conservative physical therapy. If left untreated, the shortened and spastic neck muscle can cause deformities in the face and neck. Rarely, surgery is required. HOME CARE INSTRUCTIONS   Use over-the-counter and prescription medications as directed by your caregiver.   Do stretching exercises and massage the neck as directed by your caregiver.   Follow up with physical therapy if needed and as directed by your caregiver.  SEEK IMMEDIATE MEDICAL CARE IF:   You develop difficulty breathing or noisy breathing (stridor).   You drool, develop trouble swallowing or have pain with swallowing.   You develop numbness or weakness in the hands or feet.   You have changes in speech or vision.   You have problems with urination or bowel movements.   You have difficulty walking.   You have a fever.   You have increased pain.  MAKE SURE YOU:   Understand these instructions.   Will watch your condition.   Will get help right away if you are not doing well or get worse.  Document Released: 02/29/2000 Document Revised: 02/20/2011 Document Reviewed: 04/11/2009 [facility] Patient Information 2012 [employer].

## 2011-06-06 NOTE — ED Notes (Signed)
PT. REPORTS NECK PAIN /STIFFNESS WORSE WITH MOVEMENT AND CERTAIN POSITIONS , DENIES INJURY OR FALL .

## 2011-06-06 NOTE — ED Notes (Signed)
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  Went home at 2200 hrs but was unable to lay down due to right neck pain.  Denies injury.  Rates pain 10/10 with movement.  C-collar applied at triage.   Pt c/o right side HA rated 8/10 with dizziness on 3.21.13 with radiation to posterior head   Also c/o non productive cough, Chills and night sweats since 3.13.

## 2011-06-08 NOTE — ED Provider Notes (Signed)
History     CSN: [id number]  Arrival date & time 06/06/11  0000   First MD Initiated Contact with Patient 06/06/11 0250      Chief Complaint  Patient presents with  . Neck Pain    (Consider location/radiation/quality/duration/timing/severity/associated sxs/prior treatment) Patient is a 61 y.o. female presenting with neck pain. The history is provided by the patient.  Neck Pain  This is a new problem. The current episode started more than 2 days ago. The problem occurs constantly. The problem has not changed since onset.The pain is associated with nothing. There has been no fever. The pain is present in the occipital region. The quality of the pain is described as aching. The pain does not radiate. The pain is moderate. The symptoms are aggravated by twisting (movement). The pain is the same all the time. Stiffness is present all day. Associated symptoms include headaches. Pertinent negatives include no photophobia, no visual change, no chest pain, no syncope, no numbness, no weight loss, no bowel incontinence, no bladder incontinence, no leg pain, no paresis, no tingling and no weakness. She has tried nothing for the symptoms.    Past Medical History  Diagnosis Date  . Breast cancer 2000    Rt lumpectomy, Chemo/XRT/rt axillary node   . Depression   . Low back pain     Past Surgical History  Procedure Date  . Tubal ligation   . Breast lumpectomy     for CA txed w/ chemo and radiation  . Umbilical hernia repair     Family History  Problem Relation Age of Onset  . Asthma Mother   . COPD Mother   . Lung cancer Father   . Asthma Daughter   . Kidney failure Brother   . Diabetes Brother   . Heart attack Brother   . Heart attack Mother     History  Substance Use Topics  . Smoking status: Never Smoker   . Smokeless tobacco: Not on file  . Alcohol Use: No    OB History    Grav Para Term Preterm Abortions TAB SAB Ect Mult Living                  Review of Systems    Constitutional: Positive for appetite change. Negative for fever, chills, weight loss and diaphoresis.  HENT: Positive for neck pain. Negative for hearing loss, ear pain, congestion, sore throat, facial swelling, rhinorrhea, trouble swallowing, neck stiffness, dental problem, sinus pressure and tinnitus.   Eyes: Negative for photophobia, pain, discharge, redness and visual disturbance.  Respiratory: Negative.   Cardiovascular: Negative.  Negative for chest pain and syncope.  Gastrointestinal: Positive for nausea and vomiting. Negative for bowel incontinence.  Genitourinary: Negative.  Negative for bladder incontinence.  Musculoskeletal: Negative for back pain.  Skin: Negative for rash.  Neurological: Positive for headaches. Negative for dizziness, tingling, seizures, syncope, weakness, light-headedness and numbness.  Psychiatric/Behavioral: Negative for confusion.    Allergies  Amoxicillin; Erythromycin; Morphine; and Penicillins  Home Medications   Current Outpatient Rx  Name Route Sig Dispense Refill  . VITAMIN D 1000 UNITS PO TABS Oral Take 1,000 Units by mouth daily.    Marland Kitchen FLUTICASONE-SALMETEROL 100-50 MCG/DOSE IN AEPB  1 puff and rinse mouth well, twice daily 1 each prn  . GABAPENTIN 100 MG PO CAPS Oral Take 100 mg by mouth 2 (two) times daily. Take 1 tablet in the morning and 2 tablets in the evening    . GUAIFENESIN-CODEINE 100-10 MG/5ML PO SYRP  Oral Take 5 mLs by mouth 3 (three) times daily as needed for cough. 120 mL 0  . DIAZEPAM 5 MG PO TABS Oral Take 1 tablet (5 mg total) by mouth every 6 (six) hours as needed (muscle spasm). 12 tablet 0  . HYDROCODONE-ACETAMINOPHEN 5-325 MG PO TABS Oral Take 2 tablets by mouth every 4 (four) hours as needed for pain. 20 tablet 0  . IBUPROFEN 600 MG PO TABS Oral Take 1 tablet (600 mg total) by mouth every 8 (eight) hours as needed for pain. 20 tablet 0    BP 123/71  Pulse 58  Temp(Src) 97.8 F (36.6 C) (Oral)  Resp 16  SpO2  99%  Physical Exam  Constitutional: She is oriented to person, place, and time. She appears well-developed and well-nourished. She appears distressed.  HENT:  Head: Normocephalic and atraumatic.  Right Ear: External ear normal.  Left Ear: External ear normal.  Mouth/Throat: Oropharynx is clear and moist. No oropharyngeal exudate.  Eyes: Conjunctivae and EOM are normal. Pupils are equal, round, and reactive to light. Right eye exhibits no nystagmus. Left eye exhibits no nystagmus.  Fundoscopic exam:      The right eye shows no papilledema.       The left eye shows no papilledema.  Neck: Normal range of motion, full passive range of motion without pain and phonation normal. Neck supple. Carotid bruit is not present.       Bilateral paraspinal cervical muscular tenderness with palpable muscle spasm also at superior trapezius muscles bilaterally  Cardiovascular: Normal rate, regular rhythm, normal heart sounds and intact distal pulses.  Exam reveals no gallop and no friction rub.   No murmur heard. Pulmonary/Chest: Effort normal and breath sounds normal. No respiratory distress. She has no wheezes. She has no rales.  Abdominal: Soft. Bowel sounds are normal. She exhibits no distension. There is no tenderness. There is no rebound and no guarding.  Musculoskeletal: Normal range of motion. She exhibits no edema and no tenderness.  Neurological: She is alert and oriented to person, place, and time. She has normal reflexes. No cranial nerve deficit. She exhibits normal muscle tone. Coordination normal. GCS eye subscore is 4. GCS verbal subscore is 5. GCS motor subscore is 6.  Skin: Skin is warm and dry. No rash noted. She is not diaphoretic.  Psychiatric: She has a normal mood and affect.    ED Course  Procedures (including critical care time)  Labs Reviewed - No data to display No results found.   1. Cervical strain   2. Torticollis, acute       MDM  Apparent cervical strain with  tension headache       Felisa Bonier, MD 06/08/11 [phone]

## 2011-06-10 ENCOUNTER — Other Ambulatory Visit: Payer: Self-pay | Admitting: Nurse Practitioner

## 2011-06-10 ENCOUNTER — Telehealth: Payer: Self-pay | Admitting: Hematology and Oncology

## 2011-06-10 NOTE — Telephone Encounter (Signed)
lb/fu r/s from 4/16 and 4/19 to 4/3 and 4/5 per 3/26 pof. lmonvm for pt re chagne w/new d/t's. Schedule mailed today.

## 2011-06-18 ENCOUNTER — Other Ambulatory Visit (HOSPITAL_BASED_OUTPATIENT_CLINIC_OR_DEPARTMENT_OTHER): Payer: BC Managed Care – PPO | Admitting: Lab

## 2011-06-18 DIAGNOSIS — Z170 Estrogen receptor positive status [ER+]: Secondary | ICD-10-CM

## 2011-06-18 DIAGNOSIS — M545 Low back pain: Secondary | ICD-10-CM

## 2011-06-18 DIAGNOSIS — C50919 Malignant neoplasm of unspecified site of unspecified female breast: Secondary | ICD-10-CM

## 2011-06-18 LAB — COMPREHENSIVE METABOLIC PANEL
ALT: 14 U/L (ref 0–35)
Albumin: 4 g/dL (ref 3.5–5.2)
CO2: 26 mEq/L (ref 19–32)
Calcium: 9.4 mg/dL (ref 8.4–10.5)
Chloride: 105 mEq/L (ref 96–112)
Glucose, Bld: 105 mg/dL — ABNORMAL HIGH (ref 70–99)
Potassium: 3.8 mEq/L (ref 3.5–5.3)
Sodium: 141 mEq/L (ref 135–145)
Total Protein: 7 g/dL (ref 6.0–8.3)

## 2011-06-18 LAB — CBC WITH DIFFERENTIAL/PLATELET
Basophils Absolute: 0.1 10*3/uL (ref 0.0–0.1)
Eosinophils Absolute: 0.1 10*3/uL (ref 0.0–0.5)
MCV: 98.6 fL (ref 79.5–101.0)
MONO#: 0.4 10*3/uL (ref 0.1–0.9)
MONO%: 6.3 % (ref 0.0–14.0)
RBC: 4.11 10*6/uL (ref 3.70–5.45)
RDW: 12.7 % (ref 11.2–14.5)
nRBC: 0 % (ref 0–0)

## 2011-06-18 LAB — CANCER ANTIGEN 27.29: CA 27.29: 18 U/mL (ref 0–39)

## 2011-06-18 LAB — LACTATE DEHYDROGENASE: LDH: 188 U/L (ref 94–250)

## 2011-06-19 ENCOUNTER — Telehealth: Payer: Self-pay | Admitting: Internal Medicine

## 2011-06-19 DIAGNOSIS — K219 Gastro-esophageal reflux disease without esophagitis: Secondary | ICD-10-CM

## 2011-06-19 DIAGNOSIS — R05 Cough: Secondary | ICD-10-CM

## 2011-06-19 NOTE — Telephone Encounter (Signed)
Per CY-lets have patient try Dulera 100/5 2 puffs bid and Rinse mouth after use. Refer back to GI-Dr Juanda Chance (can see PA if no appts open at this time) for GERD and cough.     Pt is aware of new medication to try and sample provided; pt aware of referral sent for GI as well.

## 2011-06-19 NOTE — Telephone Encounter (Signed)
I have left a message for Bjorn Loser with the appt date and time.  06/27/11 1:45 with Dr Juanda Chance.  I have mailed the patient new patient paperwork.

## 2011-06-20 ENCOUNTER — Telehealth: Payer: Self-pay | Admitting: Hematology and Oncology

## 2011-06-20 ENCOUNTER — Ambulatory Visit: Payer: BC Managed Care – PPO | Admitting: Hematology and Oncology

## 2011-06-20 NOTE — Telephone Encounter (Signed)
lmonvm for pt today informing her that appt today has been cancelled per LO. Per LO r/s for whenever is convenient for pt. Also lm for appt for pt to call me.

## 2011-06-20 NOTE — Telephone Encounter (Signed)
Pt returned my call and 4/5 appt was r/s'd to 4/12 @ 3:30 pm. Date per pt. Per LO ok to r/s for whenever pt can come.

## 2011-06-25 ENCOUNTER — Ambulatory Visit (INDEPENDENT_AMBULATORY_CARE_PROVIDER_SITE_OTHER): Payer: BC Managed Care – PPO | Admitting: Nurse Practitioner

## 2011-06-25 ENCOUNTER — Encounter: Payer: Self-pay | Admitting: Nurse Practitioner

## 2011-06-25 VITALS — BP 122/70 | HR 88 | Ht 66.5 in | Wt 241.1 lb

## 2011-06-25 DIAGNOSIS — R1310 Dysphagia, unspecified: Secondary | ICD-10-CM

## 2011-06-25 DIAGNOSIS — D649 Anemia, unspecified: Secondary | ICD-10-CM

## 2011-06-25 DIAGNOSIS — R05 Cough: Secondary | ICD-10-CM

## 2011-06-25 MED ORDER — DEXLANSOPRAZOLE 60 MG PO CPDR: 60.0000 mg | DELAYED_RELEASE_CAPSULE | Freq: Every day | ORAL | Status: DC

## 2011-06-25 NOTE — Progress Notes (Signed)
Diane Barker [id number] 1950-06-16   HISTORY OR PRESENT ILLNESS : Patient is a 61 year old female who had a screening colonoscopy with Dr. Juanda Chance January 2012 with findings of moderate diverticulosis and a sessile polyp in the cecum. Polyps path c/w tubular adenoma.  Patient was referred by pulmonary for evaluation of GERD. Since March she has battled a cough. Pulmonary has treated her with cough syrups and breathing treatments which have helped but not solved the problem.  Coughing is now mainly when she eats and talks loud. She denies heartburn or acid regurgitation. She does have chronic intermittent solid food dysphagia mainly to white bread, rice and lettuce. She does not take anything for GERD. No other GI complaints.  Current Medications, Allergies, Past Medical History, Past Surgical History, Family History and Social History were reviewed in [employer] record.   PHYSICAL EXAMINATION : General:  Well developed  female in no acute distress Head: Normocephalic and atraumatic Eyes:  sclerae anicteric,conjunctive pink. Ears: Normal auditory acuity Neck: Supple, no masses.  Lungs: Clear throughout to auscultation Heart: Regular rate and rhythm; no murmurs heard Abdomen: Soft, nondistended, nontender. No masses or hepatomegaly noted. Normal bowel sounds Rectal: not done Musculoskeletal: Symmetrical with no gross deformities  Skin: No lesions on visible extremities Extremities: No edema or deformities noted Neurological: Oriented, grossly nonfocal Cervical Nodes:  No significant cervical adenopathy Psychological:  Alert and cooperative. Normal mood and affect  ASSESSMENT AND PLAN :  1. We discussed extraintestinal manifestations of GERD which is possibly what she is experiencing. Her dysphagia may be secondary to esophagitis from untreated reflux. Will start her on a PPI (samples of Dexilant given). Anti-reflux literature given. Patient will follow up with Dr.  Juanda Chance in a few weeks.  2. Occasional solid food dysphagia. If this doesn't resolve with PPI then she will need EGD for further evaluation.    3.  History of adenomatous colon polyp January 2012.

## 2011-06-25 NOTE — Patient Instructions (Addendum)
Take 1 dexilant tablet  30 min before breakfast daily. Samples given. We have also given you of reflux literature.

## 2011-06-27 ENCOUNTER — Ambulatory Visit: Payer: BC Managed Care – PPO | Admitting: Internal Medicine

## 2011-06-27 ENCOUNTER — Ambulatory Visit (HOSPITAL_BASED_OUTPATIENT_CLINIC_OR_DEPARTMENT_OTHER): Payer: BC Managed Care – PPO | Admitting: Hematology and Oncology

## 2011-06-27 ENCOUNTER — Telehealth: Payer: Self-pay | Admitting: *Deleted

## 2011-06-27 ENCOUNTER — Encounter: Payer: Self-pay | Admitting: Hematology and Oncology

## 2011-06-27 ENCOUNTER — Telehealth: Payer: Self-pay | Admitting: Hematology and Oncology

## 2011-06-27 VITALS — BP 117/69 | HR 83 | Temp 97.1°F | Ht 66.5 in | Wt 241.2 lb

## 2011-06-27 DIAGNOSIS — R059 Cough, unspecified: Secondary | ICD-10-CM | POA: Insufficient documentation

## 2011-06-27 DIAGNOSIS — R1310 Dysphagia, unspecified: Secondary | ICD-10-CM | POA: Insufficient documentation

## 2011-06-27 DIAGNOSIS — C50919 Malignant neoplasm of unspecified site of unspecified female breast: Secondary | ICD-10-CM

## 2011-06-27 DIAGNOSIS — R05 Cough: Secondary | ICD-10-CM | POA: Insufficient documentation

## 2011-06-27 DIAGNOSIS — Z170 Estrogen receptor positive status [ER+]: Secondary | ICD-10-CM

## 2011-06-27 NOTE — Patient Instructions (Signed)
Patient to follow up as instructed.   Current Outpatient Prescriptions  Medication Sig Dispense Refill  . cholecalciferol (VITAMIN D) 1000 UNITS tablet Take 1,000 Units by mouth daily.      Marland Kitchen dexlansoprazole (DEXILANT) 60 MG capsule Take 1 capsule (60 mg total) by mouth daily.  30 capsule  0  . gabapentin (NEURONTIN) 100 MG capsule Take 100 mg by mouth 2 (two) times daily. Take 1 tablet in the morning and 2 tablets in the evening            April 2013  Sunday Monday Tuesday Wednesday Thursday Friday Saturday      1   2   3    LAB MO     2:15 PM  (15 min.)  Sherrie Mustache  [facility] MEDICAL ONCOLOGY 4   5   6    7   8   9   10    NEW PATIENT 15 WQ   3:30 PM  (30 min.)  Meredith Pel, NP  [facility] 11   12   EST PT 30   3:30 PM  (30 min.)  Laurice Record, MD  [facility] MEDICAL ONCOLOGY 13    14   15   16   17   18   19   20    21   22   23   24   25   26   27    28   29   14 Aug 2011  Sunday Monday Tuesday Wednesday Thursday Friday Saturday            1   2   3   4    5   6   7   8   9   10   11    12   13   14   15   16   17    FOLLOW UP 15   3:30 PM  (15 min.)  Hart Carwin, MD  [facility] 18    19   20   21   22   23   24    OFFICE VISIT   3:00 PM  (15 min.)  Waymon Budge, MD  [facility] 25    26   27   28   29   30    31

## 2011-06-27 NOTE — Progress Notes (Signed)
I agree with the plan outlined in this note 

## 2011-06-27 NOTE — Telephone Encounter (Signed)
We originally made a F/U appointment with DR Marina Goodell which was an error.  Gunnar Fusi asked me to change it to appt with Dr. Juanda Chance on5-17-2013 at 2:15PM.  I left this information on her voice mail.

## 2011-06-27 NOTE — Progress Notes (Signed)
This office note has been dictated.

## 2011-06-27 NOTE — Telephone Encounter (Signed)
appts made and printed for pt aom °

## 2011-06-30 NOTE — Progress Notes (Signed)
CC:   Diane Pounds, MD Diane Barker, M.D.  IDENTIFYING STATEMENT:  Patient is a 61 year old woman with breast cancer presents for followup.  INTERVAL HISTORY:  Diane Barker was seen a year ago.  She received a mammogram last year that was unremarkable.  She has had issues with acid reflux.  She states she was put on anti-reflux medications with improvement in symptoms.  She denies noting breast lumps, lesions or nipple discharge.  Her weight is stable.  She reports good energy levels and continues to work 2 jobs.  MEDICATIONS:  Reviewed and updated.  Past medical history, family history and social history unchanged.  PHYSICAL EXAM:  General:  Patient is alert and oriented x3.  Vitals: Pulse 83, blood pressure 117/69, temperature 97.1, respirations 20, weight 231.2 Barker.  HEENT head is atraumatic, normocephalic.  Sclerae anicteric.  Mouth moist.  Neck:  Supple.  Chest:  Clear.  Breasts: Bilateral breasts without masses or nipple discharge.  The left breast reveals a well-healed lumpectomy scar.  Abdomen:  Obese, soft, nontender.  Bowel sounds present.  Extremities:  No edema.  Lymph nodes: No axillary or cervical adenopathy.  CNS:  Nonfocal.  LAB DATA:  06/18/2011, white cell count 6.9, hemoglobin 13.8, hematocrit 40.5, platelet count 211, sodium 141, potassium 3.8, chloride 105, CO2 26, BUN 15, creatinine 0.9, glucose 105, total bilirubin 0.6, alkaline phosphatase 72, AST 15, ALT 14, calcium 9.4.  LDH 188.  CA 27-29 18.  IMPRESSION AND PLAN:  Diane Barker is a 61 year old woman with history of a stage II, T1 N1 M0, ER/PR positive breast cancer diagnosed in 2000. She is status post left lumpectomy and received 5/8 cycles of of therapy which she completed in May of 2000.  She went on to receive chest wall radiation therapy and was then was placed on tamoxifen but discontinued this prematurely. She presently has no evidence of recurrent disease.  She follows up in a year's time.  She  will continue to obtain annual mammograms.  She received a colonoscopy in January 2012 that was unremarkable.  She follows up in a year's time.    ______________________________ Laurice Record, M.D. LIO/MEDQ  D:  06/27/2011  T:  06/27/2011  Job:  [id number]

## 2011-07-01 ENCOUNTER — Other Ambulatory Visit: Payer: BC Managed Care – PPO | Admitting: Lab

## 2011-07-04 ENCOUNTER — Ambulatory Visit: Payer: BC Managed Care – PPO | Admitting: Hematology and Oncology

## 2011-07-22 ENCOUNTER — Encounter: Payer: Self-pay | Admitting: *Deleted

## 2011-08-01 ENCOUNTER — Ambulatory Visit: Payer: BC Managed Care – PPO | Admitting: Internal Medicine

## 2011-08-01 ENCOUNTER — Emergency Department (INDEPENDENT_AMBULATORY_CARE_PROVIDER_SITE_OTHER)
Admission: EM | Admit: 2011-08-01 | Discharge: 2011-08-01 | Disposition: A | Discharge status: Home / Self Care | Payer: BC Managed Care – PPO | Source: Home / Self Care | Attending: Emergency Medicine | Admitting: Emergency Medicine

## 2011-08-01 ENCOUNTER — Encounter (HOSPITAL_COMMUNITY): Payer: Self-pay | Admitting: Cardiology

## 2011-08-01 DIAGNOSIS — H6120 Impacted cerumen, unspecified ear: Secondary | ICD-10-CM

## 2011-08-01 DIAGNOSIS — H6122 Impacted cerumen, left ear: Secondary | ICD-10-CM

## 2011-08-01 DIAGNOSIS — H6121 Impacted cerumen, right ear: Secondary | ICD-10-CM

## 2011-08-01 MED ORDER — NEOMYCIN-POLYMYXIN-HC 1 % OT SOLN: 3.0000 [drp] | Freq: Three times a day (TID) | OTIC | Status: AC

## 2011-08-01 MED ORDER — NEOMYCIN-POLYMYXIN-HC 1 % OT SOLN: 3.0000 [drp] | Freq: Three times a day (TID) | OTIC | Status: DC

## 2011-08-01 NOTE — ED Provider Notes (Addendum)
History     CSN: [id number]  Arrival date & time 08/01/11  1033   First MD Initiated Contact with Patient 08/01/11 1038      Chief Complaint  Patient presents with  . Otalgia    (Consider location/radiation/quality/duration/timing/severity/associated sxs/prior treatment) HPI Comments: For several days have been feeling from her right ear that it's sounds like the sound of the ocean". It doesn't hurt at all" "it  just feels plugged and like I don't hear the same on the right side". Have not been introducing or cleaning my years and have not had any recent swims at any pool or body of water.  Patient denies any upper congestion such as sinus congestion, sore throat, or fevers.  Patient is a 61 y.o. female presenting with ear pain. The history is provided by the patient.  Otalgia This is a new problem. There is pain in the right ear. The problem occurs constantly. The problem has not changed since onset.There has been no fever. Associated symptoms include hearing loss and cough. Pertinent negatives include no ear discharge, no rhinorrhea, no vomiting, no neck pain and no rash.    Past Medical History  Diagnosis Date  . Breast cancer 2000    Rt lumpectomy, Chemo/XRT/rt axillary node   . Depression   . Low back pain   . Arthritis   . Obesity   . Diverticulosis   . Hx of adenomatous colonic polyps   . GERD (gastroesophageal reflux disease)     Past Surgical History  Procedure Date  . Tubal ligation   . Breast lumpectomy     for CA txed w/ chemo and radiation, right  . Umbilical hernia repair 1983    Family History  Problem Relation Age of Onset  . Asthma Mother   . COPD Mother   . Lung cancer Father   . Asthma Daughter   . Kidney failure Brother   . Diabetes Brother     x 2  . Heart attack Brother   . Heart attack Mother   . Breast cancer Maternal Aunt   . Colon cancer Paternal Aunt   . Diabetes Daughter     x 2  . Diabetes Mother   . Hypertension Brother      History  Substance Use Topics  . Smoking status: Never Smoker   . Smokeless tobacco: Never Used  . Alcohol Use: No    OB History    Grav Para Term Preterm Abortions TAB SAB Ect Mult Living                  Review of Systems  Constitutional: Negative for fever, activity change and fatigue.  HENT: Positive for hearing loss and ear pain. Negative for congestion, rhinorrhea, neck pain, neck stiffness, postnasal drip and ear discharge.   Eyes: Negative for pain.  Respiratory: Positive for cough.   Gastrointestinal: Negative for vomiting.  Skin: Negative for rash.    Allergies  Amoxicillin; Erythromycin; Morphine; and Penicillins  Home Medications   Current Outpatient Rx  Name Route Sig Dispense Refill  . VITAMIN D 1000 UNITS PO TABS Oral Take 1,000 Units by mouth daily.    Marland Kitchen PRESCRIPTION MEDICATION  Med for osteoporosis unsure of name    . DEXLANSOPRAZOLE 60 MG PO CPDR Oral Take 1 capsule (60 mg total) by mouth daily. 30 capsule 0  . GABAPENTIN 100 MG PO CAPS Oral Take 100 mg by mouth 2 (two) times daily. Take 1 tablet in the morning and  1 tablet in the evening      There were no vitals taken for this visit.  Physical Exam  Nursing note and vitals reviewed. Constitutional: She appears well-developed and well-nourished.  Non-toxic appearance. She does not have a sickly appearance. She does not appear ill. No distress.  HENT:  Right Ear: Tympanic membrane normal. No decreased hearing is noted.  Left Ear: Tympanic membrane normal. No decreased hearing is noted.  Ears:  Eyes: Conjunctivae are normal.  Cardiovascular:  No murmur heard. Skin: Skin is warm. No rash noted. No erythema.    ED Course  Procedures (including critical care time)  Labs Reviewed - No data to display No results found.   No diagnosis found.    MDM  Patient with bilateral cerumen impaction incidentally noted on left ear canal there is an area of erythema just at the point of where the  cerumen plug starts. Prophylactic ear antibiotics provided the patient with 3 days position for left ear canal        Jimmie Molly, MD 08/01/11 1120  Jimmie Molly, MD 08/01/11 1122

## 2011-08-01 NOTE — ED Notes (Signed)
Pt reports right ear to "sound like an ocean." Denies pain.  Denies fever. Hearing in the right ear is like a whisper. Has had ear wax impaction in the past.

## 2011-08-08 ENCOUNTER — Ambulatory Visit (INDEPENDENT_AMBULATORY_CARE_PROVIDER_SITE_OTHER)
Admission: RE | Admit: 2011-08-08 | Discharge: 2011-08-08 | Disposition: A | Discharge status: Home / Self Care | Payer: BC Managed Care – PPO | Source: Ambulatory Visit | Attending: Internal Medicine | Admitting: Internal Medicine

## 2011-08-08 ENCOUNTER — Ambulatory Visit (INDEPENDENT_AMBULATORY_CARE_PROVIDER_SITE_OTHER): Payer: BC Managed Care – PPO | Admitting: Internal Medicine

## 2011-08-08 ENCOUNTER — Encounter: Payer: Self-pay | Admitting: Internal Medicine

## 2011-08-08 VITALS — BP 130/76 | HR 80 | Ht 65.5 in | Wt 247.4 lb

## 2011-08-08 DIAGNOSIS — J40 Bronchitis, not specified as acute or chronic: Secondary | ICD-10-CM

## 2011-08-08 DIAGNOSIS — R05 Cough: Secondary | ICD-10-CM

## 2011-08-08 DIAGNOSIS — R059 Cough, unspecified: Secondary | ICD-10-CM

## 2011-08-08 DIAGNOSIS — J310 Chronic rhinitis: Secondary | ICD-10-CM

## 2011-08-08 DIAGNOSIS — K219 Gastro-esophageal reflux disease without esophagitis: Secondary | ICD-10-CM

## 2011-08-08 DIAGNOSIS — Z853 Personal history of malignant neoplasm of breast: Secondary | ICD-10-CM

## 2011-08-08 NOTE — Patient Instructions (Addendum)
Ask Dr Timothy Lasso to continue your Dexilant, since it has stopped cough due to GERD.   Order CXR- dx Cough- done.  I'll be happy to see you again as needed

## 2011-08-08 NOTE — Progress Notes (Signed)
Quick Note:  Pt aware of results. ______ 

## 2011-08-08 NOTE — Progress Notes (Signed)
Patient ID: Diane Barker, female    DOB: 05/21/1950, 61 y.o.   MRN: [id number]  HPI 08/02/10- 2 yoF never smoker followed for bronchitis with hx rhinitis, GERD, hx breast cancer. 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 Woke 2 weeks ago with persistent cough, fever, chilling. Still wakes after night sweat. We got her a cough syrup when her cough began to hurt her back. 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 She describes this as a cold. Dr Timothy Lasso gave bactrim at outset- no help. 2days ago she called [place] for Z pak and cough syrup- day 3. Now slowly better.   08/22/10- bronchitis, hx rhinitis, GERD hx breast Ca Cough got better, but for 4 days has had pain over right eye, increased cough, clear mucus. Denies fever, sore throat. 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   02/05/11-  59 yoF never smoker followed for bronchitis with hx rhinitis, GERD, hx breast cancer. Declines flu vaccine. Advair sample did help but she didn't refill it. In the last several days she has had increased cough and drainage, sneezing, possibly an early cold. Had some chills this morning but no fever and nothing purulent.  02/28/11-  59 yoF never smoker followed for bronchitis with hx rhinitis, GERD, hx breast cancer. Has declined flu shot. Now presents with acute illness including sore throat global headache, constant hard cough until she retches and can't sleep. Denies fever, nausea, myalgias.  08/08/11- 59 yoF never smoker followed for bronchitis with hx rhinitis, GERD, hx breast cancer.  PCP Dr Wyman Songster has been helpful for controlling her acid reflux symptoms. She failed Prilosec. Cough has also gotten much better and began taking Dexilant. Denies problems from spring pollen.  History of right lumpectomy with chemotherapy and radiation therapy. CT  01/22/09- reviewed with her.   IMPRESION:  1. No acute abnormality.  2. Right aortic arch unchanged, as  described above.  Provider: Fredderick Severance  CXR TODAY 06/08/11- reviewed with her:  Findings: The patient is rotated to the right. Abnormal  mediastinal contour again noted, consistent with the known right-  sided aortic arch. Cardiopericardial silhouette is at upper limits  of normal for size. There is mild vascular congestion without  overt airspace pulmonary edema. Bones are diffusely demineralized.  IMPRESSION:  Stable exam when taking into account the rotation. No new or acute  interval findings.  Original Report Authenticated By: ERIC A. MANSELL, M.D.   Review of Systems-See HPI Constitutional:   No-   weight loss, night sweats, fevers, chills, fatigue, lassitude. HEENT:   +  headaches, No-difficulty swallowing, tooth/dental problems, +sore throat,       +  sneezing, itching, ear ache, nasal congestion, post nasal drip,  CV:  No-   chest pain, orthopnea, PND, swelling in lower extremities, anasarca,  dizziness, palpitations Resp: No-   shortness of breath with exertion or at rest.              +  productive cough,  + non-productive cough,  No- coughing up of blood.              No-   change in color of mucus.  No- wheezing.   Skin: No-   rash or lesions. GI:  No-   heartburn, indigestion, abdominal pain, nausea, vomiting,  GU: MS:  No-   joint pain or swelling.   Neuro-     nothing unusual Psych:  No- change  in mood or affect. No depression or anxiety.  No memory loss.  Objective:   Physical Exam General- Alert, Oriented, Affect-appropriate, Distress- none acute, overweight Skin- rash-none, lesions- none, excoriation- none Lymphadenopathy- none Head- atraumatic            Eyes- Gross vision intact, PERRLA, conjunctivae clear secretions            Ears- Hearing, canals-normal            Nose- no- sniffing, no-Septal dev, mucus, polyps, erosion, perforation             Throat- Mallampati II , mucosa clear , drainage- none, tonsils- atrophic, dentures Neck- flexible ,  trachea midline, no stridor , thyroid nl, carotid no bruit Chest - symmetrical excursion , unlabored           Heart/CV- RRR , no murmur , no gallop  , no rub, nl s1 s2                           - JVD- none , edema- none, stasis changes- none, varices- none           Lung- very clear with no cough , dullness-none, rub- none, rales-none           Chest wall-  Abd-  Br/ Gen/ Rectal- Not done, not indicated Extrem- cyanosis- none, clubbing, none, atrophy- none, strength- nl Neuro- grossly intact to observation

## 2011-08-13 NOTE — Assessment & Plan Note (Signed)
We discussed potential for radiation therapy to cause ipsilateral fibrosis in the underlying lung but that pattern is not seen now.

## 2011-08-13 NOTE — Assessment & Plan Note (Signed)
Insignificant symptoms through spring pollen season this year.

## 2011-08-13 NOTE — Assessment & Plan Note (Signed)
Good response to Dexilant after failing Prilosec. Fosamax is an aggravating factor.

## 2011-08-13 NOTE — Assessment & Plan Note (Signed)
Chronic bronchitis in a never smoker, improved with effective acid blocker therapy and not affected by spring pollens. This is most likely been related to GERD and hopefully will remain under better control. Chest x-ray today suggests mild congestion but exam does not demonstrate right-sided fluid overload. She can discuss strategy with Dr. Timothy Lasso

## 2011-08-20 ENCOUNTER — Ambulatory Visit (HOSPITAL_COMMUNITY): Payer: BC Managed Care – PPO | Attending: Cardiovascular Disease | Admitting: Radiology

## 2011-08-20 ENCOUNTER — Other Ambulatory Visit (HOSPITAL_COMMUNITY): Payer: Self-pay | Admitting: Internal Medicine

## 2011-08-20 DIAGNOSIS — I517 Cardiomegaly: Secondary | ICD-10-CM | POA: Insufficient documentation

## 2011-08-20 DIAGNOSIS — I059 Rheumatic mitral valve disease, unspecified: Secondary | ICD-10-CM | POA: Insufficient documentation

## 2011-08-20 DIAGNOSIS — R079 Chest pain, unspecified: Secondary | ICD-10-CM | POA: Insufficient documentation

## 2011-08-20 DIAGNOSIS — Z853 Personal history of malignant neoplasm of breast: Secondary | ICD-10-CM | POA: Insufficient documentation

## 2011-08-20 DIAGNOSIS — E669 Obesity, unspecified: Secondary | ICD-10-CM | POA: Insufficient documentation

## 2011-08-20 DIAGNOSIS — R609 Edema, unspecified: Secondary | ICD-10-CM

## 2011-08-20 NOTE — Progress Notes (Signed)
Echocardiogram performed.  

## 2011-09-01 ENCOUNTER — Ambulatory Visit (INDEPENDENT_AMBULATORY_CARE_PROVIDER_SITE_OTHER): Payer: BC Managed Care – PPO | Admitting: Internal Medicine

## 2011-09-01 ENCOUNTER — Ambulatory Visit (INDEPENDENT_AMBULATORY_CARE_PROVIDER_SITE_OTHER)
Admission: RE | Admit: 2011-09-01 | Discharge: 2011-09-01 | Disposition: A | Discharge status: Home / Self Care | Payer: BC Managed Care – PPO | Source: Ambulatory Visit | Attending: Internal Medicine | Admitting: Internal Medicine

## 2011-09-01 ENCOUNTER — Encounter: Payer: Self-pay | Admitting: Internal Medicine

## 2011-09-01 VITALS — BP 124/78 | HR 64 | Ht 65.5 in | Wt 244.2 lb

## 2011-09-01 DIAGNOSIS — R05 Cough: Secondary | ICD-10-CM

## 2011-09-01 DIAGNOSIS — K219 Gastro-esophageal reflux disease without esophagitis: Secondary | ICD-10-CM

## 2011-09-01 DIAGNOSIS — J440 Chronic obstructive pulmonary disease with acute lower respiratory infection: Secondary | ICD-10-CM

## 2011-09-01 MED ORDER — PROMETHAZINE-CODEINE 6.25-10 MG/5ML PO SYRP: 5.0000 mL | ORAL_SOLUTION | Freq: Four times a day (QID) | ORAL | Status: AC | PRN

## 2011-09-01 NOTE — Patient Instructions (Addendum)
Cough syrup for occasional help with cough.  Order CXR-   Chronic cough  Order- referral to Dr Juanda Chance GI for GERD exacerbation

## 2011-09-01 NOTE — Progress Notes (Signed)
Patient ID: Diane Barker, female    DOB: 1950/12/08, 61 y.o.   MRN: [id number]  HPI 08/02/10- 30 yoF never smoker followed for bronchitis with hx rhinitis, GERD, hx breast cancer. 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 Woke 2 weeks ago with persistent cough, fever, chilling. Still wakes after night sweat. We got her a cough syrup when her cough began to hurt her back. 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 She describes this as a cold. Dr Timothy Lasso gave bactrim at outset- no help. 2days ago she called [place] for Z pak and cough syrup- day 3. Now slowly better.   08/22/10- bronchitis, hx rhinitis, GERD hx breast Ca Cough got better, but for 4 days has had pain over right eye, increased cough, clear mucus. Denies fever, sore throat. 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   02/05/11-  59 yoF never smoker followed for bronchitis with hx rhinitis, GERD, hx breast cancer. Declines flu vaccine. Advair sample did help but she didn't refill it. In the last several days she has had increased cough and drainage, sneezing, possibly an early cold. Had some chills this morning but no fever and nothing purulent.  02/28/11-  59 yoF never smoker followed for bronchitis with hx rhinitis, GERD, hx breast cancer. Has declined flu shot. Now presents with acute illness including sore throat global headache, constant hard cough until she retches and can't sleep. Denies fever, nausea, myalgias.  08/08/11- 59 yoF never smoker followed for bronchitis with hx rhinitis, GERD, hx breast cancer.  PCP Dr Wyman Songster has been helpful for controlling her acid reflux symptoms. She failed Prilosec. Cough has also gotten much better and began taking Dexilant. Denies problems from spring pollen.  History of right lumpectomy with chemotherapy and radiation therapy. CT  01/22/09- reviewed with her.   IMPRESION:  1. No acute abnormality.  2. Right aortic arch unchanged, as  described above.  Provider: Fredderick Severance  CXR TODAY 06/08/11- reviewed with her: Findings: The patient is rotated to the right. Abnormal  mediastinal contour again noted, consistent with the known right-  sided aortic arch. Cardiopericardial silhouette is at upper limits  of normal for size. There is mild vascular congestion without  overt airspace pulmonary edema. Bones are diffusely demineralized.  IMPRESSION:  Stable exam when taking into account the rotation. No new or acute  interval findings.  Original Report Authenticated By: ERIC A. MANSELL, M.D.   09/01/11-  32 yoF never smoker followed for bronchitis with hx rhinitis, GERD, hx breast cancer.  PCP Dr Timothy Lasso ACUTE VISIT: cough-dry and hacky, wheezing, SOB, concerned if fluid is off lungs as well; not sleeping due to cough-rough past 3 days. Persistent hacking cough started about 7 days after last here. She is aware of reflux despite her Dexilant. Coughs badly when eating. Coughs until she retches white mucus. Ankle edema resolved. She sleeps on 3 pillows because of her coughing. Dr. Timothy Lasso as already told her she needs followup with Dr Juanda Chance GI. Denies sinus drip or wheeze but does occasionally sneeze. CXR 06/08/11- mild vascular congestion.  Review of Systems-See HPI Constitutional:   No-   weight loss, night sweats, fevers, chills, fatigue, lassitude. HEENT:   +  headaches, No-difficulty swallowing, tooth/dental problems, +sore throat,       +  sneezing, itching, ear ache, nasal congestion, +post nasal drip,  CV:  No-   chest pain, orthopnea, PND, swelling in lower extremities, anasarca,  dizziness, palpitations Resp: No-   shortness of breath with exertion or at rest.              +  productive cough,  + non-productive cough,  No- coughing up of blood.              No-   change in color of mucus.  No- wheezing.   Skin: No-   rash or lesions. GI:  +  heartburn, indigestion, abdominal pain, nausea, vomiting,  GU: MS:  No-    joint pain or swelling.   Neuro-     nothing unusual Psych:  No- change in mood or affect. No depression or anxiety.  No memory loss.  Objective:   Physical Exam BP 124/78  Pulse 64  Ht 5' 5.5" (1.664 m)  Wt 244 lb 3.2 oz (110.768 kg)  BMI 40.02 kg/m2  SpO2 100%  General- Alert, Oriented, Affect-appropriate, Distress- none acute, overweight Skin- rash-none, lesions- none, excoriation- none Lymphadenopathy- none Head- atraumatic            Eyes- Gross vision intact, PERRLA, conjunctivae clear secretions            Ears- Hearing, canals-normal            Nose- no- sniffing, no-Septal dev, mucus, polyps, erosion, perforation             Throat- Mallampati II , mucosa clear , drainage- none, tonsils- atrophic, dentures Neck- flexible , trachea midline, no stridor , thyroid nl, carotid no bruit Chest - symmetrical excursion , unlabored           Heart/CV- RRR , no murmur , no gallop  , no rub, nl s1 s2                           - JVD- none , edema- none, stasis changes- none, varices- none           Lung- hacking cough with no rhonchi wheeze , dullness-none, rub- none, rales-none           Chest wall-  Abd-  Br/ Gen/ Rectal- Not done, not indicated Extrem- cyanosis- none, clubbing, none, atrophy- none, strength- nl Neuro- grossly intact to observation

## 2011-09-02 NOTE — Progress Notes (Signed)
Quick Note:  Pt is aware of results. ______ 

## 2011-09-08 NOTE — Assessment & Plan Note (Addendum)
She clearly recognizes association between the reflux she feels that her coughing fits. There is a cyclical cough pattern -coughing increases reflux which triggers more cough. It is appropriate that she has a follow up appointment with Dr Juanda Chance GI.

## 2011-09-08 NOTE — Assessment & Plan Note (Signed)
Plan-chest x-ray, cough syrup. We are addressing the reflux component as noted above.

## 2011-09-19 ENCOUNTER — Encounter: Payer: Self-pay | Admitting: Internal Medicine

## 2011-09-19 ENCOUNTER — Ambulatory Visit (INDEPENDENT_AMBULATORY_CARE_PROVIDER_SITE_OTHER): Payer: BC Managed Care – PPO | Admitting: Internal Medicine

## 2011-09-19 VITALS — BP 130/64 | HR 64 | Ht 66.0 in | Wt 244.0 lb

## 2011-09-19 DIAGNOSIS — R05 Cough: Secondary | ICD-10-CM

## 2011-09-19 DIAGNOSIS — K219 Gastro-esophageal reflux disease without esophagitis: Secondary | ICD-10-CM

## 2011-09-19 MED ORDER — DEXLANSOPRAZOLE 60 MG PO CPDR: 60.0000 mg | DELAYED_RELEASE_CAPSULE | Freq: Every day | ORAL | Status: DC

## 2011-09-19 NOTE — Progress Notes (Signed)
Diane Barker November 22, 1950 MRN [id number]   History of Present Illness:  This is a 61 year old white female with chronic bronchitis and cough who is seen for evaluation of gastroesophageal reflux. She saw Midge Minium in April and was started on Dexilant 60 mg samples with complete relief of her cough until several weeks ago when she ran out of Dexilant and her cough came back. She has restarted it but the cough has persisted. She denies any dysphagia or odynophagia. Her cough occurs mostly during the day. She sleeps on 3 pillows. We saw her for a screening colonoscopy in January 2012 with findings of moderately severe diverticulosis and a cecal polyp which was a tubular adenoma. There is a history of breast cancer.   Past Medical History  Diagnosis Date  . Breast cancer 2000    Rt lumpectomy, Chemo/XRT/rt axillary node   . Depression   . Low back pain   . Arthritis   . Obesity   . Diverticulosis   . Hx of adenomatous colonic polyps   . GERD (gastroesophageal reflux disease)    Past Surgical History  Procedure Date  . Tubal ligation   . Breast lumpectomy     for CA txed w/ chemo and radiation, right  . Umbilical hernia repair 1983    reports that she has never smoked. She has never used smokeless tobacco. She reports that she does not drink alcohol or use illicit drugs. family history includes Asthma in her daughter and mother; Breast cancer in her maternal aunt; COPD in her mother; Colon cancer in her paternal aunt; Diabetes in her brother, daughter, and mother; Heart attack in her brother and mother; Hypertension in her brother; Kidney failure in her brother; and Lung cancer in her father. Allergies  Allergen Reactions  . Erythromycin     REACTION: stomach cramps  . Morphine     REACTION: Hallucinations  . Penicillins     REACTION: unkkown        Review of Systems:Positive for rectal irritation and hemorrhoids since her last colonoscopy  The remainder of the 10 point ROS  is negative except as outlined in H&P   Physical Exam: General appearance  Well developed, in no distress. Eyes- non icteric. HEENT nontraumatic, normocephalic. Mouth no lesions, tongue papillated, no cheilosis.Normal voice. No hoarseness. Several episodes of coughing  Neck supple without adenopathy, thyroid not enlarged, no carotid bruits, no JVD. Lungs Clear to auscultation bilaterally. Cor normal S1, normal S2, regular rhythm, no murmur,  quiet precordium. Abdomen: Mildly protuberant soft nontender with normoactive bowel sounds.  Rectal:Not done. Extremities no pedal edema. Skin no lesions. Neurological alert and oriented x 3. Psychological normal mood and affect.  Assessment and Plan:  Problem #1 Suspected silent gastroesophageal reflux. Patient with chronic cough improved on a proton pump inhibitor. We will supply her with additional samples of Dexilant. She will continue on antireflux measures. We will also schedule an upper endoscopy and biopsies to rule out Barrett's esophagus. She may need a barium esophagram or esophageal manometry to assess her lower esophageal sphincter and primary peristalsis to see whether she would be a candidate for Nissen fundoplication.  Problem #2 History of adenomatous colon polyps. Her last colonoscopy was in January 2012. A recall colonoscopy will be due in January 2017.  09/19/2011 Lina Sar

## 2011-09-19 NOTE — Patient Instructions (Addendum)
You have been scheduled for an endoscopy with propofol. Please follow written instructions given to you at your visit today. CC: Dr Creola Corn

## 2011-09-20 ENCOUNTER — Encounter: Payer: Self-pay | Admitting: Internal Medicine

## 2011-09-22 ENCOUNTER — Encounter: Payer: Self-pay | Admitting: Internal Medicine

## 2011-09-26 ENCOUNTER — Ambulatory Visit (AMBULATORY_SURGERY_CENTER): Payer: BC Managed Care – PPO | Admitting: Internal Medicine

## 2011-09-26 ENCOUNTER — Encounter: Payer: Self-pay | Admitting: Internal Medicine

## 2011-09-26 VITALS — BP 152/97 | HR 62 | Temp 95.8°F | Resp 20 | Ht 66.0 in | Wt 244.0 lb

## 2011-09-26 DIAGNOSIS — K2900 Acute gastritis without bleeding: Secondary | ICD-10-CM

## 2011-09-26 DIAGNOSIS — R05 Cough: Secondary | ICD-10-CM

## 2011-09-26 DIAGNOSIS — K219 Gastro-esophageal reflux disease without esophagitis: Secondary | ICD-10-CM

## 2011-09-26 DIAGNOSIS — D131 Benign neoplasm of stomach: Secondary | ICD-10-CM

## 2011-09-26 MED ORDER — SODIUM CHLORIDE 0.9 % IV SOLN: 500.0000 mL | INTRAVENOUS | Status: DC

## 2011-09-26 MED ORDER — PANTOPRAZOLE SODIUM 40 MG PO TBEC: 40.0000 mg | DELAYED_RELEASE_TABLET | Freq: Every day | ORAL | Status: DC

## 2011-09-26 MED ORDER — RANITIDINE HCL 150 MG PO TABS: 150.0000 mg | ORAL_TABLET | Freq: Every day | ORAL | Status: DC

## 2011-09-26 NOTE — Patient Instructions (Addendum)
YOU HAD AN ENDOSCOPIC PROCEDURE TODAY AT THE [facility]: Refer to the procedure report that was given to you for any specific questions about what was found during the examination.  If the procedure report does not answer your questions, please call your gastroenterologist to clarify.  If you requested that your care partner not be given the details of your procedure findings, then the procedure report has been included in a sealed envelope for you to review at your convenience later.  YOU SHOULD EXPECT: Some feelings of bloating in the abdomen. Passage of more gas than usual.  Walking can help get rid of the air that was put into your GI tract during the procedure and reduce the bloating.   DIET: Your first meal following the procedure should be a light meal and then it is ok to progress to your normal diet.  A half-sandwich or bowl of soup is an example of a good first meal.  Heavy or fried foods are harder to digest and may make you feel nauseous or bloated.  Likewise meals heavy in dairy and vegetables can cause extra gas to form and this can also increase the bloating.  Drink plenty of fluids but you should avoid alcoholic beverages for 24 hours.  ACTIVITY: Your care partner should take you home directly after the procedure.  You should plan to take it easy, moving slowly for the rest of the day.  You can resume normal activity the day after the procedure however you should NOT DRIVE or use heavy machinery for 24 hours (because of the sedation medicines used during the test).    SYMPTOMS TO REPORT IMMEDIATELY: A gastroenterologist can be reached at any hour.  During normal business hours, 8:30 AM to 5:00 PM Monday through Friday, call [phone].  After hours and on weekends, please call the GI answering service at [phone] who will take a message and have the physician on call contact you.   Following upper endoscopy (EGD)  Vomiting of blood or coffee ground material  New  chest pain or pain under the shoulder blades  Painful or persistently difficult swallowing  New shortness of breath  Fever of 100F or higher  Black, tarry-looking stools  FOLLOW UP: If any biopsies were taken you will be contacted by phone or by letter within the next 1-3 weeks.  Call your gastroenterologist if you have not heard about the biopsies in 3 weeks.  Our staff will call the home number listed on your records the next business day following your procedure to check on you and address any questions or concerns that you may have at that time regarding the information given to you following your procedure. This is a courtesy call and so if there is no answer at the home number and we have not heard from you through the emergency physician on call, we will assume that you have returned to your regular daily activities without incident.  SIGNATURES/CONFIDENTIALITY: You and/or your care partner have signed paperwork which will be entered into your electronic medical record.  These signatures attest to the fact that that the information above on your After Visit Summary has been reviewed and is understood.  Full responsibility of the confidentiality of this discharge information lies with you and/or your care-partner.   Finish your Dexilant Resume your normal medications

## 2011-09-26 NOTE — Op Note (Addendum)
[facility] [address] [place], [place]  [address]  ENDOSCOPY PROCEDURE REPORT  PATIENT:  Diane, Barker  MR#:  [id number] BIRTHDATE:  [birth year], 61 yrs. old  GENDER:  female  ENDOSCOPIST:  Hedwig Morton. Juanda Chance, MD Referred by:  Creola Corn, M.D.  PROCEDURE DATE:  09/26/2011 PROCEDURE:  EGD with biopsy, 43239 ASA CLASS:  Class II INDICATIONS:  cough, GERD  MEDICATIONS:   MAC sedation, administered by CRNA, propofol (Diprivan) 150 mg, glycopyrrolate (Robinal) 0.2 mg TOPICAL ANESTHETIC:  none  DESCRIPTION OF PROCEDURE:   After the risks benefits and alternatives of the procedure were thoroughly explained, informed consent was obtained.  The LB GIF-H180 [id number] endoscope was introduced through the mouth and advanced to the second portion of the duodenum, without limitations.  The instrument was slowly withdrawn as the mucosa was fully examined. <<PROCEDUREIMAGES>>  Mild gastritis was found. mild diffuse erythema antrum and body With standard forceps, a biopsy was obtained and sent to pathology (see image2).  nl esophagus. With standard forceps, a biopsy was obtained and sent to pathology (see image4, image5, and image6). normal Z-line- bx  Otherwise the examination was normal (see image3 and image1). no hiatal hernia or esophagitis    Retroflexed views revealed no abnormalities.    The scope was then withdrawn from the patient and the procedure completed.  COMPLICATIONS:  None  ENDOSCOPIC IMPRESSION: 1) Mild gastritis, s/p biopsy to r/o H.Pylori 2) Normal esophagus 3) Otherwise normal examination, no hiatal hernia RECOMMENDATIONS: 1) Await biopsy results 2) Anti-reflux regimen nothing to suggest severe reflux such as stricture or erosions, continue PPI , Dexilant 60 mg am, Ranitidine 150 mg hs if cough continues, will obtain Barium esophagram OV 6 weeks  REPEAT EXAM:  In 0 year(s) for.  ______________________________ Hedwig Morton. Juanda Chance, MD  CC:  n. REVISED:   09/26/2011 12:25 PM eSIGNED:   Hedwig Morton. Neah Sporrer at 09/26/2011 12:25 PM  Rosie Fate, [phone]

## 2011-09-29 ENCOUNTER — Telehealth: Payer: Self-pay | Admitting: *Deleted

## 2011-09-29 NOTE — Telephone Encounter (Signed)
  Follow up Call-  Call back number 09/26/2011  Post procedure Call Back phone  # [phone]  Permission to leave phone message Yes     No answer at # given.  Left message on voicemail.

## 2011-10-01 ENCOUNTER — Encounter: Payer: Self-pay | Admitting: Internal Medicine

## 2011-12-29 ENCOUNTER — Telehealth: Payer: Self-pay | Admitting: Internal Medicine

## 2011-12-29 MED ORDER — PROMETHAZINE-CODEINE 6.25-10 MG/5ML PO SYRP: 5.0000 mL | ORAL_SOLUTION | Freq: Four times a day (QID) | ORAL | Status: DC | PRN

## 2011-12-29 NOTE — Telephone Encounter (Signed)
[email or web address].Caprina Wussow, CMA  

## 2011-12-29 NOTE — Telephone Encounter (Signed)
Last OV 09/01/11, next OV pr. I spoke with the pt and she is c/o having increased dry cough x 2 weeks. Pt states she went to her PCP on Friday for the cough and her protonix was changed to dexilant, she was advised to take asmanex inhaler, and she was also prescribed ABX for an ear infection. Pt is also using Robitussin as needed and claritin daily.  Pt states she has coughed so much today that it has caused her to vomit twice. Pt states she has not felt any improvement since OV on Friday. Please advise.Carron Curie, CMA   Allergies  Allergen Reactions  . Erythromycin     REACTION: stomach cramps  . Morphine     REACTION: Hallucinations  . Penicillins     REACTION: Artis Flock

## 2011-12-29 NOTE — Telephone Encounter (Signed)
Per CY-okay to give patient Phenergan with codeine # 200 ml 1 tsp every 6 hours prn cough no refills; Spoke with patient- aware of RX called to pharmacy.

## 2012-01-16 ENCOUNTER — Encounter: Payer: Self-pay | Admitting: Adult Health

## 2012-01-16 ENCOUNTER — Ambulatory Visit (INDEPENDENT_AMBULATORY_CARE_PROVIDER_SITE_OTHER): Payer: BC Managed Care – PPO | Admitting: Adult Health

## 2012-01-16 VITALS — BP 122/80 | HR 70 | Temp 97.1°F | Ht 65.5 in | Wt 243.6 lb

## 2012-01-16 DIAGNOSIS — J440 Chronic obstructive pulmonary disease with acute lower respiratory infection: Secondary | ICD-10-CM

## 2012-01-16 MED ORDER — CEFDINIR 300 MG PO CAPS: 300.0000 mg | ORAL_CAPSULE | Freq: Two times a day (BID) | ORAL | Status: DC

## 2012-01-16 MED ORDER — PREDNISONE 10 MG PO TABS: ORAL_TABLET | ORAL | Status: DC

## 2012-01-16 NOTE — Assessment & Plan Note (Signed)
Flare w/ associated sinusitis  xopenex neb in office   Plan Cefdinir 300mg  Twice daily  For 10 days  Mucinex DM Twice daily  As needed  Cough/congestion  Saline nasal rinses As needed   Prednisone taper over next week  Please contact office for sooner follow up if symptoms do not improve or worsen or seek emergency care  follow up Dr. Maple Hudson  In 6 weeks and As needed

## 2012-01-16 NOTE — Patient Instructions (Addendum)
Cefdinir 300mg  Twice daily  For 10 days  Mucinex DM Twice daily  As needed  Cough/congestion  Saline nasal rinses As needed   Prednisone taper over next week  Please contact office for sooner follow up if symptoms do not improve or worsen or seek emergency care  follow up Dr. Maple Hudson  In 6 weeks and As needed

## 2012-01-16 NOTE — Progress Notes (Signed)
Patient ID: LYNDALL FAVA, female    DOB: 1950/06/23, 61 y.o.   MRN: [id number]  HPI 08/02/10- 61 yoF never smoker followed for bronchitis with hx rhinitis, GERD, hx breast cancer. 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 Woke 2 weeks ago with persistent cough, fever, chilling. Still wakes after night sweat. We got her a cough syrup when her cough began to hurt her back. 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 She describes this as a cold. Dr Timothy Lasso gave bactrim at outset- no help. 2days ago she called [place] for Z pak and cough syrup- day 3. Now slowly better.   08/22/10- bronchitis, hx rhinitis, GERD hx breast Ca Cough got better, but for 4 days has had pain over right eye, increased cough, clear mucus. Denies fever, sore throat. 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   02/05/11-  61 yoF never smoker followed for bronchitis with hx rhinitis, GERD, hx breast cancer. Declines flu vaccine. Advair sample did help but she didn't refill it. In the last several days she has had increased cough and drainage, sneezing, possibly an early cold. Had some chills this morning but no fever and nothing purulent.  02/28/11-  61 yoF never smoker followed for bronchitis with hx rhinitis, GERD, hx breast cancer. Has declined flu shot. Now presents with acute illness including sore throat global headache, constant hard cough until she retches and can't sleep. Denies fever, nausea, myalgias.  08/08/11- 61 yoF never smoker followed for bronchitis with hx rhinitis, GERD, hx breast cancer.  PCP Dr Wyman Songster has been helpful for controlling her acid reflux symptoms. She failed Prilosec. Cough has also gotten much better and began taking Dexilant. Denies problems from spring pollen.  History of right lumpectomy with chemotherapy and radiation therapy. CT  01/22/09- reviewed with her.   IMPRESION:  1. No acute abnormality.  2. Right aortic arch unchanged, as  described above.  Provider: Fredderick Severance  CXR TODAY 06/08/11- reviewed with her: Findings: The patient is rotated to the right. Abnormal  mediastinal contour again noted, consistent with the known right-  sided aortic arch. Cardiopericardial silhouette is at upper limits  of normal for size. There is mild vascular congestion without  overt airspace pulmonary edema. Bones are diffusely demineralized.  IMPRESSION:  Stable exam when taking into account the rotation. No new or acute  interval findings.  Original Report Authenticated By: ERIC A. MANSELL, M.D.   09/01/11-  61 yoF never smoker followed for bronchitis with hx rhinitis, GERD, hx breast cancer.  PCP Dr Timothy Lasso ACUTE VISIT: cough-dry and hacky, wheezing, SOB, concerned if fluid is off lungs as well; not sleeping due to cough-rough past 3 days. Persistent hacking cough started about 7 days after last here. She is aware of reflux despite her Dexilant. Coughs badly when eating. Coughs until she retches white mucus. Ankle edema resolved. She sleeps on 3 pillows because of her coughing. Dr. Timothy Lasso as already told her she needs followup with Dr Juanda Chance GI. Denies sinus drip or wheeze but does occasionally sneeze. CXR 06/08/11- mild vascular congestion.  01/16/2012 Acute OV  Complains of 4 weeks of cough, congestion, sinus drainage, wheezing Seen by primary care Dr. 3-4 weeks ago. Given antibiotic, unknown which one. Cough never got better Using Phenergan with codeine cough syrup without much relief. She denies any diarrhea, hemoptysis, orthopnea, PND, or leg swelling Chest x-ray June 2013 without any acute process noted  Review of Systems-See HPI Constitutional:   No-   weight loss, night sweats, fevers, chills, fatigue, lassitude. HEENT:   +  headaches, No-difficulty swallowing, tooth/dental problems, +sore throat,       +  sneezing, itching, ear ache, nasal congestion, +post nasal drip,  CV:  No-   chest pain, orthopnea, PND,  swelling in lower extremities, anasarca,  dizziness, palpitations Resp: No-   shortness of breath with exertion or at rest.              +  productive cough,  + non-productive cough,  No- coughing up of blood.              No-   change in color of mucus.  No- wheezing.   Skin: No-   rash or lesions. GI:  +  heartburn, indigestion, abdominal pain, nausea, vomiting,  GU: MS:  No-   joint pain or swelling.   Neuro-     nothing unusual Psych:  No- change in mood or affect. No depression or anxiety.  No memory loss.  Objective:   GEN: A/Ox3; pleasant , NAD  HEENT:  [place]/AT,  EACs-clear, TMs-wnl, NOSE-swollen/red on left, max tenderness , THROAT-clear, no lesions, no postnasal drip or exudate noted.   NECK:  Supple w/ fair ROM; no JVD; normal carotid impulses w/o bruits; no thyromegaly or nodules palpated; no lymphadenopathy.  RESP  Few rhonchi no accessory muscle use, no dullness to percussion  CARD:  RRR, no m/r/g  , no peripheral edema, pulses intact, no cyanosis or clubbing.  GI:   Soft & nt; nml bowel sounds; no organomegaly or masses detected.  Musco: Warm bil, no deformities or joint swelling noted.   Neuro: alert, no focal deficits noted.    Skin: Warm, no lesions or rashes

## 2012-01-26 ENCOUNTER — Telehealth: Payer: Self-pay | Admitting: Adult Health

## 2012-01-26 MED ORDER — FLUCONAZOLE 150 MG PO TABS: 150.0000 mg | ORAL_TABLET | Freq: Once | ORAL | Status: DC

## 2012-01-26 NOTE — Telephone Encounter (Signed)
Pt was seen by TP on 01/16/12:  Patient Instructions     Cefdinir 300mg  Twice daily For 10 days  Mucinex DM Twice daily As needed Cough/congestion  Saline nasal rinses As needed  Prednisone taper over next week  Please contact office for sooner follow up if symptoms do not improve or worsen or seek emergency care  follow up Dr. Maple Hudson In 6 weeks and As needed     -----  Called, spoke with pt.  Believes she has a yeast infection -- c/o itching and burning in the vaginal area that started yesterday.  Requesting rx for this.  Tammy, pls advise.  Thank you.  [facility]  Allergies verified:  Allergies  Allergen Reactions  . Erythromycin     REACTION: stomach cramps  . Morphine     REACTION: Hallucinations  . Penicillins     REACTION: Artis Flock

## 2012-01-26 NOTE — Telephone Encounter (Signed)
Per TP: ok for diflucan 150mg  #1 with 0 refills.  Rx sent to pharmacy.  Pt is aware.

## 2012-03-08 ENCOUNTER — Ambulatory Visit: Payer: BC Managed Care – PPO | Admitting: Internal Medicine

## 2012-03-11 ENCOUNTER — Other Ambulatory Visit: Payer: Self-pay | Admitting: *Deleted

## 2012-03-11 MED ORDER — RANITIDINE HCL 150 MG PO TABS: 150.0000 mg | ORAL_TABLET | Freq: Every day | ORAL | Status: DC

## 2012-03-11 NOTE — Telephone Encounter (Signed)
Pharmacy states that insurance requires pt to get 90 day supply of ranitidine for coverage of the medication. Rx sent.

## 2012-03-15 ENCOUNTER — Telehealth: Payer: Self-pay | Admitting: Internal Medicine

## 2012-03-15 ENCOUNTER — Ambulatory Visit (INDEPENDENT_AMBULATORY_CARE_PROVIDER_SITE_OTHER)
Admission: RE | Admit: 2012-03-15 | Discharge: 2012-03-15 | Disposition: A | Discharge status: Home / Self Care | Payer: BC Managed Care – PPO | Source: Ambulatory Visit | Attending: Internal Medicine | Admitting: Internal Medicine

## 2012-03-15 ENCOUNTER — Other Ambulatory Visit: Payer: Self-pay | Admitting: Hematology and Oncology

## 2012-03-15 DIAGNOSIS — J40 Bronchitis, not specified as acute or chronic: Secondary | ICD-10-CM

## 2012-03-15 DIAGNOSIS — Z1231 Encounter for screening mammogram for malignant neoplasm of breast: Secondary | ICD-10-CM

## 2012-03-15 DIAGNOSIS — Z853 Personal history of malignant neoplasm of breast: Secondary | ICD-10-CM

## 2012-03-15 DIAGNOSIS — Z9010 Acquired absence of unspecified breast and nipple: Secondary | ICD-10-CM

## 2012-03-15 NOTE — Telephone Encounter (Signed)
Per CY - to order CXR. Dx Bronchitis.   Pt is aware that order has been made.

## 2012-03-15 NOTE — Progress Notes (Signed)
Quick Note:  Pt aware that Xray is clear. ______

## 2012-03-15 NOTE — Telephone Encounter (Signed)
Spoke with patient, patient states that x6 days had been having a really bad non prod cough. Patient states she saw her GP given meds(tessalon perles)  but was not given cxr.a CXR.  Denies any fevers or chills.  Patient is requesting CXR to be ordered for her.  Dr. Maple Hudson please advise, thank uou!!

## 2012-04-05 ENCOUNTER — Ambulatory Visit: Payer: BC Managed Care – PPO

## 2012-04-17 ENCOUNTER — Telehealth: Payer: Self-pay | Admitting: Oncology

## 2012-04-17 ENCOUNTER — Encounter: Payer: Self-pay | Admitting: Oncology

## 2012-04-17 NOTE — Telephone Encounter (Signed)
LMONVM ADVIISNG THE PT OF THE RE-ASSIGNING FROM DR LO TO DR HA AND THE F/U LETTER AND APPT CALENDAR.

## 2012-04-21 ENCOUNTER — Ambulatory Visit: Payer: BC Managed Care – PPO

## 2012-05-04 ENCOUNTER — Telehealth: Payer: Self-pay | Admitting: Internal Medicine

## 2012-05-04 NOTE — Telephone Encounter (Signed)
I spoke with pt. She c/o nausea and migraine x yesterday. I advised pt she needed to call her PCP regarding this for recs. She voiced her understanding. Nothing further was needed

## 2012-05-27 ENCOUNTER — Telehealth: Payer: Self-pay | Admitting: Oncology

## 2012-06-04 ENCOUNTER — Other Ambulatory Visit (HOSPITAL_BASED_OUTPATIENT_CLINIC_OR_DEPARTMENT_OTHER): Payer: BC Managed Care – PPO | Admitting: Lab

## 2012-06-04 DIAGNOSIS — C50919 Malignant neoplasm of unspecified site of unspecified female breast: Secondary | ICD-10-CM

## 2012-06-04 LAB — CBC WITH DIFFERENTIAL/PLATELET
Basophils Absolute: 0 10*3/uL (ref 0.0–0.1)
Eosinophils Absolute: 0.1 10*3/uL (ref 0.0–0.5)
HCT: 38.3 % (ref 34.8–46.6)
HGB: 13.4 g/dL (ref 11.6–15.9)
LYMPH%: 27.6 % (ref 14.0–49.7)
MCV: 97.2 fL (ref 79.5–101.0)
MONO#: 0.4 10*3/uL (ref 0.1–0.9)
MONO%: 6.2 % (ref 0.0–14.0)
NEUT#: 3.8 10*3/uL (ref 1.5–6.5)
Platelets: 228 10*3/uL (ref 145–400)
WBC: 6 10*3/uL (ref 3.9–10.3)

## 2012-06-04 LAB — COMPREHENSIVE METABOLIC PANEL (CC13)
ALT: 16 U/L (ref 0–55)
CO2: 26 mEq/L (ref 22–29)
Calcium: 9.4 mg/dL (ref 8.4–10.4)
Chloride: 106 mEq/L (ref 98–107)
Creatinine: 1.2 mg/dL — ABNORMAL HIGH (ref 0.6–1.1)
Glucose: 128 mg/dl — ABNORMAL HIGH (ref 70–99)
Total Bilirubin: 0.63 mg/dL (ref 0.20–1.20)
Total Protein: 6.9 g/dL (ref 6.4–8.3)

## 2012-06-05 LAB — CANCER ANTIGEN 27.29: CA 27.29: 18 U/mL (ref 0–39)

## 2012-06-09 ENCOUNTER — Ambulatory Visit: Payer: BC Managed Care – PPO | Admitting: Hematology and Oncology

## 2012-06-09 ENCOUNTER — Telehealth: Payer: Self-pay | Admitting: Oncology

## 2012-06-09 ENCOUNTER — Ambulatory Visit (HOSPITAL_BASED_OUTPATIENT_CLINIC_OR_DEPARTMENT_OTHER): Payer: BC Managed Care – PPO | Admitting: Oncology

## 2012-06-09 VITALS — BP 118/74 | HR 75 | Temp 97.8°F | Resp 22 | Ht 65.5 in | Wt 256.9 lb

## 2012-06-09 DIAGNOSIS — Z853 Personal history of malignant neoplasm of breast: Secondary | ICD-10-CM

## 2012-06-09 NOTE — Progress Notes (Signed)
[name]  [phone] [phone]   OFFICE PROGRESS NOTE   Cc:  [name], MD  DIAGNOSIS: history of breast cancer; ER+/PR+; stage II; T1 N1 M0 in 2000.    PAST THERAPY: status post right lumpectomy and received 5/8 cycles of of therapy which she completed in May of 2000. She went on to receive chest wall radiation therapy and was then was placed on tamoxifen but discontinued this prematurely due to intolerable hot flashes.  CURRENT THERAPY:  Watchful observation.   INTERVAL HISTORY: Diane Barker 62 y.o. female returns for regular follow up by herself.  She used to see Dr. Dalene Carrow who left the practice.  I assumed her care as of today.  She feels well in term of history of breast cancer.  She denied palpable breast mass, skin thickening, erythema.  She has had chronic cough and dyspepsia.  Extensive evaluation were consistent with GERD.  She has been on PPI with significant improvement of her symptoms.    The rest of the 14-point review of system were negative.   Past Medical History  Diagnosis Date  . Breast cancer 2000    Rt lumpectomy, Chemo/XRT/rt axillary node   . Depression   . Low back pain   . Arthritis   . Obesity   . Diverticulosis   . Hx of adenomatous colonic polyps   . GERD (gastroesophageal reflux disease)     Past Surgical History  Procedure Laterality Date  . Tubal ligation    . Breast lumpectomy      for CA txed w/ chemo and radiation, right  . Umbilical hernia repair  1983    Current Outpatient Prescriptions  Medication Sig Dispense Refill  . alendronate (FOSAMAX) 70 MG tablet Take 1 tablet by mouth Once a week.      . cholecalciferol (VITAMIN D) 1000 UNITS tablet Take 1,000 Units by mouth daily.      Marland Kitchen dexlansoprazole (DEXILANT) 60 MG capsule Take 60 mg by mouth daily.      Marland Kitchen loratadine (CLARITIN) 10 MG tablet Take 10 mg by mouth daily.      . mometasone (ASMANEX) 220 MCG/INH inhaler Inhale 1 puff into the  lungs 2 (two) times daily.      . promethazine-codeine (PHENERGAN WITH CODEINE) 6.25-10 MG/5ML syrup Take 5 mLs by mouth every 6 (six) hours as needed for cough.  200 mL  0  . ranitidine (ZANTAC) 150 MG tablet Take 1 tablet (150 mg total) by mouth at bedtime.  90 tablet  1   No current facility-administered medications for this visit.    ALLERGIES:  is allergic to erythromycin; morphine; and penicillins.  REVIEW OF SYSTEMS:  The rest of the 14-point review of system was negative.   Filed Vitals:   06/09/12 1438  BP: 118/74  Pulse: 75  Temp: 97.8 F (36.6 C)  Resp: 22   Wt Readings from Last 3 Encounters:  06/09/12 256 lb 14.4 oz (116.529 kg)  01/16/12 243 lb 9.6 oz (110.496 kg)  09/26/11 244 lb (110.678 kg)   ECOG Performance status: 1  PHYSICAL EXAMINATION:   General:  well-nourished woman,  in no acute distress.  Eyes:  no scleral icterus.  ENT:  There were no oropharyngeal lesions.  Neck was without thyromegaly.  Lymphatics:  Negative cervical, supraclavicular or axillary adenopathy.  Respiratory: lungs were clear bilaterally without wheezing or crackles.  Cardiovascular:  Regular rate and rhythm, S1/S2, without murmur, rub or gallop.  There  was no pedal edema.  GI:  abdomen was soft, flat, nontender, nondistended, without organomegaly.  Muscoloskeletal:  no spinal tenderness of palpation of vertebral spine.  Skin exam was without echymosis, petichae.  Neuro exam was nonfocal.  Patient was able to get on and off exam table without assistance.  Gait was normal.  Patient was alert and oriented.  Attention was good.   Language was appropriate.  Mood was normal without depression.  Speech was not pressured.  Thought content was not tangential.  Bilateral breast exam were negative.  There was a well healed scar in the right breast.  The rest of the breast exam was negative.        LABORATORY/RADIOLOGY DATA:  Lab Results  Component Value Date   WBC 6.0 06/04/2012   HGB 13.4  06/04/2012   HCT 38.3 06/04/2012   PLT 228 06/04/2012   GLUCOSE 128* 06/04/2012   CHOL  Value: 192        ATP III CLASSIFICATION:  <200     mg/dL   Desirable  409-811  mg/dL   Borderline High  >=914    mg/dL   High 78/29/5621   TRIG 70 02/04/2008   HDL 48 02/04/2008   LDLCALC  Value: 130        Total Cholesterol/HDL:CHD Risk Coronary Heart Disease Risk Table                     Men   Women  1/2 Average Risk   3.4   3.3* 02/04/2008   ALKPHOS 82 06/04/2012   ALT 16 06/04/2012   AST 17 06/04/2012   NA 140 06/04/2012   K 3.7 06/04/2012   CL 106 06/04/2012   CREATININE 1.2* 06/04/2012   BUN 16.8 06/04/2012   CO2 26 06/04/2012   HGBA1C  Value: 5.7 (NOTE)   The ADA recommends the following therapeutic goal for glycemic   control related to Hgb A1C measurement:   Goal of Therapy:   < 7.0% Hgb A1C   Reference: American Diabetes Association: Clinical Practice   Recommendations 2008, Diabetes Care,  2008, 31:(Suppl 1). 02/03/2008     ASSESSMENT AND PLAN:   History of breast cancer:  In remission.  I referred her to the [facility] for routine screening mammogram within 1 month.  As she is 14 years out from the diagnosis, and she asked why she needs to come to the Cancer, I see no strong indication for her to be followed by an Best boy.  I advised her on routine self breast exam and yearly mammogram.  I asked her to watch out for symptoms of metastatic disease.  She agreed to this and preferred to see her PCP for follow up.      The length of time of the face-to-face encounter was 10 minutes. More than 50% of time was spent counseling and coordination of care.

## 2012-07-07 ENCOUNTER — Ambulatory Visit: Payer: BC Managed Care – PPO

## 2012-07-14 ENCOUNTER — Ambulatory Visit: Payer: BC Managed Care – PPO

## 2012-07-16 ENCOUNTER — Ambulatory Visit: Payer: BC Managed Care – PPO

## 2012-08-25 ENCOUNTER — Ambulatory Visit: Payer: BC Managed Care – PPO

## 2012-09-16 ENCOUNTER — Telehealth: Payer: Self-pay | Admitting: Internal Medicine

## 2012-09-16 NOTE — Telephone Encounter (Signed)
Patient states she has had hemorrhoid issues since her colonoscopy in 2012. States the external hemorrhoid does not get better with OTC creams. Scheduled with Dr. Juanda Chance on 09/20/12 at 2:00 PM.

## 2012-09-20 ENCOUNTER — Ambulatory Visit (INDEPENDENT_AMBULATORY_CARE_PROVIDER_SITE_OTHER): Payer: BC Managed Care – PPO | Admitting: Internal Medicine

## 2012-09-20 ENCOUNTER — Encounter: Payer: Self-pay | Admitting: Internal Medicine

## 2012-09-20 VITALS — BP 122/64 | HR 78 | Ht 66.0 in | Wt 254.6 lb

## 2012-09-20 DIAGNOSIS — K648 Other hemorrhoids: Secondary | ICD-10-CM

## 2012-09-20 MED ORDER — FLUCONAZOLE 100 MG PO TABS: 100.0000 mg | ORAL_TABLET | Freq: Every day | ORAL | Status: DC

## 2012-09-20 MED ORDER — HYDROCORTISONE ACETATE 25 MG RE SUPP: 25.0000 mg | Freq: Every day | RECTAL | Status: DC

## 2012-09-20 NOTE — Progress Notes (Signed)
Diane Barker Sep 20, 1950 MRN [id number]  History of Present Illness:  This is a 62 year old white female with rectal itching and pain when she walks or when she has a bowel movement. She has attributed this to hemorrhoids which flared up after her last colonoscopy in January 2012 and, according to her, have never retreated. The colonoscopy showed moderate to severe diverticulosis and a cecal polyp which was a tubular adenoma. She has a history of breast cancer in 2000. She has gastroesophageal reflux for which she was seen in our office in July 2013.   Past Medical History  Diagnosis Date  . Breast cancer 2000    Rt lumpectomy, Chemo/XRT/rt axillary node   . Depression   . Low back pain   . Arthritis   . Obesity   . Diverticulosis   . Hx of adenomatous colonic polyps   . GERD (gastroesophageal reflux disease)    Past Surgical History  Procedure Laterality Date  . Tubal ligation    . Breast lumpectomy      for CA txed w/ chemo and radiation, right  . Umbilical hernia repair  1983    reports that she has never smoked. She has never used smokeless tobacco. She reports that she does not drink alcohol or use illicit drugs. family history includes Asthma in her daughter and mother; Breast cancer in her maternal aunt; COPD in her mother; Colon cancer in her paternal aunt; Diabetes in her brother, daughter, and mother; Heart attack in her brother and mother; Hypertension in her brother; Kidney failure in her brother; and Lung cancer in her father. Allergies  Allergen Reactions  . Erythromycin     REACTION: stomach cramps  . Morphine     REACTION: Hallucinations  . Penicillins     REACTION: unkkown        Review of Systems: Denies rectal bleeding abdominal pain constipation  The remainder of the 10 point ROS is negative except as outlined in H&P   Physical Exam: General appearance  Well developed, in no distress. Overweight Eyes- non icteric. HEENT nontraumatic,  normocephalic. Mouth no lesions, tongue papillated, no cheilosis. Neck supple without adenopathy, thyroid not enlarged, no carotid bruits, no JVD. Lungs Clear to auscultation bilaterally. Cor normal S1, normal S2, regular rhythm, no murmur,  quiet precordium. Abdomen: Soft nontender normoactive bowel sounds. No distention. Rectal: And anoscopic exam reveals skin tag at 9:00 somewhat tender but not inflamed, normal rectal sphincter tone.atrophic appearing skin at rectovaginal area, 1-2 first-grade hemorrhoids internally. No bleeding. Stool is Hemoccult negative. No evidence of proctitis. Extremities no pedal edema. Skin no lesions. Neurological alert and oriented x 3. Psychological normal mood and affect.  Assessment and Plan:  Problem #52 62 year old white female with symptomatic internal hemorrhoids and an external hemorrhoidal tag. She has evidence of an atrophic rectovaginal wall and skin, which is causing excessive sensitivity to any contamination with urine or stool. She was told not to take estrogens because of history of breast cancer. We are going to put her on Anusol-HC suppository each bedtime and Resinol topical ointment for skin irritation and as a skin barrier. She is up-to-date on her colonoscopy. I have asked her to discuss with her gynecologist  use of topical estrogens to apply to the rectovaginal wall. We will give her Diflucan 100 mg 1 by mouth daily x 3.   09/20/2012 Lina Sar

## 2012-09-20 NOTE — Patient Instructions (Addendum)
We have sent the following medications to your pharmacy for you to pick up at your convenience: Anusol Diflucan  We have given you a sample of Resinol. You may purchase this over the counter if it helps you.  CC:Dr Creola Corn, Dr Maisie Fus [facility] office, attn PA

## 2012-11-25 ENCOUNTER — Telehealth: Payer: Self-pay | Admitting: Internal Medicine

## 2012-11-25 MED ORDER — PREDNISONE 10 MG PO TABS: ORAL_TABLET | ORAL | Status: DC

## 2012-11-25 NOTE — Telephone Encounter (Signed)
Per CY-okay to give Prednisone 10 mg #20 take 4 x 2 days, 3 x 2 days, 2 x 2 days, 1 x 2 days, then stop no refills.   Pt is aware of Rx and sent to [facility].

## 2012-11-25 NOTE — Telephone Encounter (Signed)
I spoke with pt. She reports sneezing, dry cough, chest congestion, nasal congestion, PND, sore throat x couple days. She has been using a nasal spray (does not know the name) and using her tessalon pearls. She is requesting further recs. Please advise Dr. Maple Hudson thanks Last OV 09/01/11 No pending appt Allergies  Allergen Reactions  . Erythromycin     REACTION: stomach cramps  . Morphine     REACTION: Hallucinations  . Penicillins     REACTION: Artis Flock

## 2012-12-24 ENCOUNTER — Ambulatory Visit
Admission: RE | Admit: 2012-12-24 | Discharge: 2012-12-24 | Disposition: A | Discharge status: Home / Self Care | Payer: BC Managed Care – PPO | Source: Ambulatory Visit | Attending: Oncology | Admitting: Oncology

## 2012-12-24 DIAGNOSIS — Z853 Personal history of malignant neoplasm of breast: Secondary | ICD-10-CM

## 2013-03-16 ENCOUNTER — Other Ambulatory Visit: Payer: Self-pay | Admitting: Internal Medicine

## 2013-06-08 ENCOUNTER — Ambulatory Visit: Payer: BC Managed Care – PPO | Admitting: Internal Medicine

## 2013-06-12 ENCOUNTER — Other Ambulatory Visit: Payer: Self-pay | Admitting: Internal Medicine

## 2013-06-15 DIAGNOSIS — I2699 Other pulmonary embolism without acute cor pulmonale: Secondary | ICD-10-CM

## 2013-06-15 DIAGNOSIS — I82409 Acute embolism and thrombosis of unspecified deep veins of unspecified lower extremity: Secondary | ICD-10-CM

## 2013-06-15 HISTORY — DX: Acute embolism and thrombosis of unspecified deep veins of unspecified lower extremity: I82.409

## 2013-06-15 HISTORY — DX: Other pulmonary embolism without acute cor pulmonale: I26.99

## 2013-06-20 ENCOUNTER — Encounter: Payer: Self-pay | Admitting: Internal Medicine

## 2013-06-20 ENCOUNTER — Other Ambulatory Visit: Payer: 59

## 2013-06-20 ENCOUNTER — Ambulatory Visit (INDEPENDENT_AMBULATORY_CARE_PROVIDER_SITE_OTHER): Payer: 59 | Admitting: Internal Medicine

## 2013-06-20 VITALS — BP 130/82 | HR 91 | Ht 65.5 in | Wt 250.0 lb

## 2013-06-20 DIAGNOSIS — M79669 Pain in unspecified lower leg: Secondary | ICD-10-CM

## 2013-06-20 DIAGNOSIS — R0600 Dyspnea, unspecified: Secondary | ICD-10-CM

## 2013-06-20 DIAGNOSIS — M79661 Pain in right lower leg: Secondary | ICD-10-CM

## 2013-06-20 DIAGNOSIS — R0609 Other forms of dyspnea: Secondary | ICD-10-CM

## 2013-06-20 DIAGNOSIS — J440 Chronic obstructive pulmonary disease with acute lower respiratory infection: Secondary | ICD-10-CM

## 2013-06-20 DIAGNOSIS — M79609 Pain in unspecified limb: Secondary | ICD-10-CM

## 2013-06-20 DIAGNOSIS — R0989 Other specified symptoms and signs involving the circulatory and respiratory systems: Secondary | ICD-10-CM

## 2013-06-20 MED ORDER — PROMETHAZINE-CODEINE 6.25-10 MG/5ML PO SYRP: 5.0000 mL | ORAL_SOLUTION | Freq: Four times a day (QID) | ORAL | Status: DC | PRN

## 2013-06-20 MED ORDER — AZITHROMYCIN 250 MG PO TABS: 250.0000 mg | ORAL_TABLET | Freq: Every day | ORAL | Status: DC

## 2013-06-20 NOTE — Progress Notes (Signed)
Patient ID: Diane Barker, female    DOB: [birth year], 63 y.o.   MRN: [id number]  HPI 08/02/10- 47 yoF never smoker followed for bronchitis with hx rhinitis, GERD, hx breast cancer. 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 Woke 2 weeks ago with persistent cough, fever, chilling. Still wakes after night sweat. We got her a cough syrup when her cough began to hurt her back. 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 She describes this as a cold. Dr Virgina Jock gave bactrim at outset- no help. 2days ago she called Diane Barker for Z pak and cough syrup- day 3. Now slowly better.   08/22/10- bronchitis, hx rhinitis, GERD hx breast Ca Cough got better, but for 4 days has had pain over right eye, increased cough, clear mucus. Denies fever, sore throat. 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   02/05/11-  [name] never smoker followed for bronchitis with hx rhinitis, GERD, hx breast cancer. Declines flu vaccine. Advair sample did help but she didn't refill it. In the last several days she has had increased cough and drainage, sneezing, possibly an early cold. Had some chills this morning but no fever and nothing purulent.  02/28/11-  [name] never smoker followed for bronchitis with hx rhinitis, GERD, hx breast cancer. Has declined flu shot. Now presents with acute illness including sore throat global headache, constant hard cough until she retches and can't sleep. Denies fever, nausea, myalgias.  08/08/11- [name] never smoker followed for bronchitis with hx rhinitis, GERD, hx breast cancer.  PCP Dr Oran Rein has been helpful for controlling her acid reflux symptoms. She failed Prilosec. Cough has also gotten much better and began taking Dexilant. Denies problems from spring pollen.  History of right lumpectomy with chemotherapy and radiation therapy. CT  01/22/09- reviewed with her.   IMPRESION:  1. No acute abnormality.  2. Right aortic arch unchanged, as  described above.  Provider: Sherle Poe  CXR TODAY 06/08/11- reviewed with her: Findings: The patient is rotated to the right. Abnormal  mediastinal contour again noted, consistent with the known right-  sided aortic arch. Cardiopericardial silhouette is at upper limits  of normal for size. There is mild vascular congestion without  overt airspace pulmonary edema. Bones are diffusely demineralized.  IMPRESSION:  Stable exam when taking into account the rotation. No new or acute  interval findings.  Original Report Authenticated By: ERIC A. MANSELL, M.D.   09/01/11-  [name] never smoker followed for bronchitis with hx rhinitis, GERD, hx breast cancer.  PCP Dr Virgina Jock ACUTE VISIT: cough-dry and hacky, wheezing, SOB, concerned if fluid is off lungs as well; not sleeping due to cough-rough past 3 days. Persistent hacking cough started about 7 days after last here. She is aware of reflux despite her Dexilant. Coughs badly when eating. Coughs until she retches white mucus. Ankle edema resolved. She sleeps on 3 pillows because of her coughing. Dr. Virgina Jock as already told her she needs followup with Dr Diane Barker GI. Denies sinus drip or wheeze but does occasionally sneeze. CXR 06/08/11- mild vascular congestion.  01/16/2012 Acute OV  Complains of 4 weeks of cough, congestion, sinus drainage, wheezing Seen by primary care Dr. 3-4 weeks ago. Given antibiotic, unknown which one. Cough never got better Using Phenergan with codeine cough syrup without much relief. She denies any diarrhea, hemoptysis, orthopnea, PND, or leg swelling Chest x-ray June 2013 without any acute process noted  06/20/13- 62  yoF never smoker followed for bronchitis with hx rhinitis, GERD, hx breast cancer.  PCP Dr Virgina Jock FOLLOWS FOR: constant cough-not productive enough to bring up-has been vomiting;chills since Thursday-denies any fevers; SOB when walking short distances, pain in right calf-has varicose veins as well. Has been going  on since Feb 26th 2015. Wheezing at times.  Dr Virgina Jock did CXR "fine". She has taken 3 rounds prednisone, claritin, mucinex. Gradually better but still coughs until she retches.  Review of Systems-See HPI Constitutional:   No-   weight loss, night sweats, fevers, chills, fatigue, lassitude. HEENT:   +  headaches, No-difficulty swallowing, tooth/dental problems, +sore throat,       +  sneezing, itching, ear ache, nasal congestion, +post nasal drip,  CV:  No-   chest pain, orthopnea, PND, swelling in lower extremities, anasarca,  dizziness, palpitations Resp: +shortness of breath with exertion or at rest.              +  productive cough,  + non-productive cough,  No- coughing up of blood.              No-   change in color of mucus.  No- wheezing.   Skin: No-   rash or lesions. GI:  +  heartburn, indigestion, abdominal pain, +nausea, +vomiting,  GU: MS:  No-   joint pain or swelling.   Neuro-     nothing unusual Psych:  No- change in mood or affect. No depression or anxiety.  No memory loss.  Objective:  OBJ- Physical Exam General- Alert, Oriented, Affect-appropriate, Distress- none acute Skin- rash-none, lesions- none, excoriation- none Lymphadenopathy- none Head- atraumatic            Eyes- Gross vision intact, PERRLA, conjunctivae and secretions clear            Ears- Hearing, canals-normal            Nose- Clear, no-Septal dev, mucus, polyps, erosion, perforation             Throat- Mallampati II , mucosa clear , drainage- none, tonsils- atrophic Neck- flexible , trachea midline, no stridor , thyroid nl, carotid no bruit Chest - symmetrical excursion , unlabored           Heart/CV- RRR , no murmur , no gallop  , no rub, nl s1 s2                           - JVD- none , edema- none, stasis changes- none, varices- none           Lung- clear to P&A, wheeze- none, cough+harsh , dullness-none, rub- none, rales-none           Chest wall-  Abd- tender-no, distended-no, bowel  sounds-present, HSM- no Br/ Gen/ Rectal- Not done, not indicated Extrem- +/_ Homan's R, tender along medial anterior aspect of R calf Neuro- grossly intact to observation

## 2013-06-20 NOTE — Patient Instructions (Signed)
Script sent for Z pak  Script for codeine cough syrup  Order lab- D dimer    Dx dyspnea, calf pain

## 2013-06-21 ENCOUNTER — Observation Stay (HOSPITAL_COMMUNITY)
Admission: EM | Admit: 2013-06-21 | Discharge: 2013-06-22 | Disposition: A | Discharge status: Home / Self Care | Payer: 59 | Attending: Internal Medicine | Admitting: Internal Medicine

## 2013-06-21 ENCOUNTER — Encounter (HOSPITAL_COMMUNITY): Payer: Self-pay | Admitting: Emergency Medicine

## 2013-06-21 ENCOUNTER — Emergency Department (HOSPITAL_COMMUNITY): Payer: 59

## 2013-06-21 DIAGNOSIS — M79661 Pain in right lower leg: Secondary | ICD-10-CM | POA: Insufficient documentation

## 2013-06-21 DIAGNOSIS — M79609 Pain in unspecified limb: Secondary | ICD-10-CM

## 2013-06-21 DIAGNOSIS — D649 Anemia, unspecified: Secondary | ICD-10-CM | POA: Insufficient documentation

## 2013-06-21 DIAGNOSIS — R059 Cough, unspecified: Secondary | ICD-10-CM

## 2013-06-21 DIAGNOSIS — I824Z9 Acute embolism and thrombosis of unspecified deep veins of unspecified distal lower extremity: Secondary | ICD-10-CM | POA: Insufficient documentation

## 2013-06-21 DIAGNOSIS — M1710 Unilateral primary osteoarthritis, unspecified knee: Secondary | ICD-10-CM | POA: Insufficient documentation

## 2013-06-21 DIAGNOSIS — M7989 Other specified soft tissue disorders: Secondary | ICD-10-CM

## 2013-06-21 DIAGNOSIS — R079 Chest pain, unspecified: Secondary | ICD-10-CM

## 2013-06-21 DIAGNOSIS — R05 Cough: Secondary | ICD-10-CM

## 2013-06-21 DIAGNOSIS — M810 Age-related osteoporosis without current pathological fracture: Secondary | ICD-10-CM | POA: Insufficient documentation

## 2013-06-21 DIAGNOSIS — I82409 Acute embolism and thrombosis of unspecified deep veins of unspecified lower extremity: Secondary | ICD-10-CM

## 2013-06-21 DIAGNOSIS — I2699 Other pulmonary embolism without acute cor pulmonale: Principal | ICD-10-CM

## 2013-06-21 DIAGNOSIS — Z853 Personal history of malignant neoplasm of breast: Secondary | ICD-10-CM | POA: Insufficient documentation

## 2013-06-21 LAB — BASIC METABOLIC PANEL
BUN: 14 mg/dL (ref 6–23)
CO2: 23 mEq/L (ref 19–32)
Calcium: 8.4 mg/dL (ref 8.4–10.5)
Chloride: 102 mEq/L (ref 96–112)
Creatinine, Ser: 0.86 mg/dL (ref 0.50–1.10)
GFR calc Af Amer: 82 mL/min — ABNORMAL LOW (ref >90)
GFR calc non Af Amer: 70 mL/min — ABNORMAL LOW (ref >90)
Glucose, Bld: 85 mg/dL (ref 70–99)
Potassium: 3.7 mEq/L (ref 3.7–5.3)
Sodium: 139 mEq/L (ref 137–147)

## 2013-06-21 LAB — CBC WITH DIFFERENTIAL/PLATELET
Basophils Absolute: 0 10*3/uL (ref 0.0–0.1)
Basophils Relative: 0 % (ref 0–1)
Eosinophils Absolute: 0.1 10*3/uL (ref 0.0–0.7)
Eosinophils Relative: 2 % (ref 0–5)
HCT: 36.8 % (ref 36.0–46.0)
Hemoglobin: 12.8 g/dL (ref 12.0–15.0)
Lymphocytes Relative: 20 % (ref 12–46)
Lymphs Abs: 1.9 10*3/uL (ref 0.7–4.0)
MCH: 33.4 pg (ref 26.0–34.0)
MCHC: 34.8 g/dL (ref 30.0–36.0)
MCV: 96.1 fL (ref 78.0–100.0)
Monocytes Absolute: 1.1 10*3/uL — ABNORMAL HIGH (ref 0.1–1.0)
Monocytes Relative: 11 % (ref 3–12)
Neutro Abs: 6.4 10*3/uL (ref 1.7–7.7)
Neutrophils Relative %: 67 % (ref 43–77)
Platelets: 201 10*3/uL (ref 150–400)
RBC: 3.83 MIL/uL — ABNORMAL LOW (ref 3.87–5.11)
RDW: 12.5 % (ref 11.5–15.5)
WBC: 9.6 10*3/uL (ref 4.0–10.5)

## 2013-06-21 LAB — D-DIMER, QUANTITATIVE: D-Dimer: 7.13 mg/L FEU — ABNORMAL HIGH (ref 0.00–0.49)

## 2013-06-21 LAB — PROTIME-INR
INR: 1.05 (ref 0.00–1.49)
Prothrombin Time: 13.5 seconds (ref 11.6–15.2)

## 2013-06-21 LAB — APTT: aPTT: 31 seconds (ref 24–37)

## 2013-06-21 LAB — ANTITHROMBIN III: AntiThromb III Func: 92 % (ref 75–120)

## 2013-06-21 MED ORDER — BUDESONIDE-FORMOTEROL FUMARATE 160-4.5 MCG/ACT IN AERO
2.0000 | INHALATION_SPRAY | Freq: Two times a day (BID) | RESPIRATORY_TRACT | Status: DC
  Administered 2013-06-21: 2 via RESPIRATORY_TRACT
  Filled 2013-06-21: qty 6

## 2013-06-21 MED ORDER — HYDROCODONE-ACETAMINOPHEN 5-325 MG PO TABS
1.0000 | ORAL_TABLET | Freq: Once | ORAL | Status: AC
  Administered 2013-06-21: 1 via ORAL
  Filled 2013-06-21: qty 1

## 2013-06-21 MED ORDER — RIVAROXABAN 15 MG PO TABS
15.0000 mg | ORAL_TABLET | Freq: Once | ORAL | Status: AC
  Administered 2013-06-21: 15 mg via ORAL
  Filled 2013-06-21: qty 1

## 2013-06-21 MED ORDER — ONDANSETRON HCL 4 MG PO TABS: 4.0000 mg | ORAL_TABLET | Freq: Four times a day (QID) | ORAL | Status: DC | PRN

## 2013-06-21 MED ORDER — ONDANSETRON HCL 4 MG/2ML IJ SOLN: 4.0000 mg | Freq: Four times a day (QID) | INTRAMUSCULAR | Status: DC | PRN

## 2013-06-21 MED ORDER — IOHEXOL 350 MG/ML SOLN
100.0000 mL | Freq: Once | INTRAVENOUS | Status: AC | PRN
  Administered 2013-06-21: 100 mL via INTRAVENOUS

## 2013-06-21 MED ORDER — RIVAROXABAN 15 MG PO TABS: 15.0000 mg | ORAL_TABLET | Freq: Once | ORAL | Status: DC

## 2013-06-21 MED ORDER — PANTOPRAZOLE SODIUM 40 MG PO TBEC
40.0000 mg | DELAYED_RELEASE_TABLET | Freq: Every day | ORAL | Status: DC
Start: 2013-06-21 — End: 2013-06-22
  Administered 2013-06-22: 40 mg via ORAL
  Filled 2013-06-21: qty 1

## 2013-06-21 MED ORDER — SODIUM CHLORIDE 0.9 % IV BOLUS (SEPSIS)
1000.0000 mL | Freq: Once | INTRAVENOUS | Status: AC
  Administered 2013-06-21: 1000 mL via INTRAVENOUS

## 2013-06-21 MED ORDER — RIVAROXABAN 15 MG PO TABS: 15.0000 mg | ORAL_TABLET | Freq: Two times a day (BID) | ORAL | Status: DC

## 2013-06-21 MED ORDER — FAMOTIDINE 40 MG PO TABS
40.0000 mg | ORAL_TABLET | Freq: Every day | ORAL | Status: DC
  Administered 2013-06-21: 40 mg via ORAL
  Filled 2013-06-21 (×2): qty 1

## 2013-06-21 MED ORDER — SODIUM CHLORIDE 0.9 % IJ SOLN
3.0000 mL | Freq: Two times a day (BID) | INTRAMUSCULAR | Status: DC
  Administered 2013-06-21 – 2013-06-22 (×2): 3 mL via INTRAVENOUS

## 2013-06-21 MED ORDER — RIVAROXABAN 15 MG PO TABS
15.0000 mg | ORAL_TABLET | Freq: Two times a day (BID) | ORAL | Status: DC
  Administered 2013-06-22: 15 mg via ORAL
  Filled 2013-06-21 (×3): qty 1

## 2013-06-21 MED ORDER — FLUTICASONE PROPIONATE 50 MCG/ACT NA SUSP
1.0000 | Freq: Every day | NASAL | Status: DC | PRN
  Filled 2013-06-21: qty 16

## 2013-06-21 NOTE — ED Notes (Signed)
Patient transported to Ultrasound 

## 2013-06-21 NOTE — ED Notes (Signed)
Per pt sent here by her doctor for possible DVT. Pain in right leg. Pt elevated D dimer.

## 2013-06-21 NOTE — Assessment & Plan Note (Signed)
Acute bronchitis since late February, with significant residual cough despite prednisone  By her report. She isn't sure if Dr Virgina Jock gave her an antibiotic. Plan- Zpak, Codeine cough syrup

## 2013-06-21 NOTE — H&P (Signed)
Physician Admission History and Physical     PCP:   Precious Reel, MD   Chief Complaint:  RLE pain, pos D dimer at her pulmonary office   HPI: Diane Barker is an 63 y.o. female.  Pt presents to ED after pos d dimer at pulmonologist after presenting w/ chronic cough and new onset RLE pain. She was found to have RLE DVT and CTA showed B/L segmental PE. She had stable vitals and remained on RA, but given that CTA showed possible R heart strain, I was called for admission  She has no hx of tob abuse, no HRT, no OCPs, no recent travel. Does have very remote hx of breast cancer that is in remission for over a decade.   Review of Systems:  Having RLE pain. Nonproductive cough. Otherwise negative    Past History Past Medical History (reviewed - no changes required): Left knee OA,    Osteoporosis,    B12 Def/Per Anemia,   Depression,     Atypical Chest pains negw/up ,   Stage 2 T1cN1M0 ER/PR+ Breast CA 12/99with Lumpectomy/ 5 cycles CMF/XRT,   Hyperlipidemia,   H/O Nonspecific Dematitis, Neuropathy,   LE Edema,  Overweight,  ?Asthma, Sxatic varicose Veins - Dr Aleda Grana. L5 Spondylolithesis c chronic LBP, CErumen Impactions, Tarsal Tunnel, Scoliosis on x ray Surgical History (reviewed - no changes required): S/P tubal ligation Breast Cancer 12/99, chemo, XRT, lumpectomy L5 Spondylolisthesis Family History (reviewed - no changes required): Father died from lung cancer, CHF Mother 46, CAD/MI, DM2 Brother died at age 74 from suicide/accidental death Brother 73, died from EDRD, CHF, CAD Brother 60, HTN, DM2 1/2 sister deceased sz. Social History (reviewed - no changes required): Patient has 2 daughters, works @ [employer].  She does not smoke or drink.   Complete Medication List: 1)  Cyanocobalamin 1000 Mcg/ml Inj Soln (Cyanocobalamin) .... Administer 1048mcg im once per month for total of 5 months 2)  Diflucan 150 Mg Oral Tabs (Fluconazole) .... Take 1 tablet by mouth, repeat in 3  days time 3 3)  Nystatin 100000 Unit/gm Ext Powd (Nystatin) .... Apply to affected areas 2-3 times daily as directed 4)  Symbicort 160-4.5 Mcg/act Aero (Budesonide-formoterol fumarate) .Marland Kitchen.. 1 bid 5)  Anusol-hc 2.5 % Crea (Hydrocortisone) .... Apply bid to hemorrhoids 6)  Flonase 50 Mcg/act Susp (Fluticasone propionate) .... Instill 2 sprays in each nostril once daily 7)  Proctofoam Hc 1-1 % Foam (Hydrocortisone ace-pramoxine) .... Apply to affected area 3 times daily 8)  Cvs Lysine 500 Mg Tabs (Lysine) .... Take 1 tab daily 9)  Dexilant 60 Mg Cpdr (Dexlansoprazole) .... Take 1 capsule by mouth every day 10)  Zantac 150 Maximum Strength 150 Mg Tabs (Ranitidine hcl) .Marland Kitchen.. 1 hs   Allergies:   Allergies  Allergen Reactions  . Erythromycin     REACTION: stomach cramps  . Morphine     REACTION: Hallucinations  . Penicillins     REACTION: unkkown  . Relafen [Nabumetone] Nausea Only    Physical Exam: Filed Vitals:   06/21/13 1800 06/21/13 1813 06/21/13 1846 06/21/13 1952  BP: 119/99 119/99 120/82 107/68  Pulse: 86 83 94 83  Temp:  98.3 F (36.8 C) 98.3 F (36.8 C) 98.4 F (36.9 C)  TempSrc:   Oral Oral  Resp:  18 18 18   Height:   5\' 5"  (1.651 m)   Weight:   111.403 kg (245 lb 9.6 oz)   SpO2: 97% 100% 100% 95%   Gen:  WF in NAD  HEENT: anicteric , mmm  Lungs: CTAB, no wheezes, rales  Cardio: RRR , no mRG  Abd: soft,NT,ND  MSK: no focal deficits  Neuro:  No focal deficits  Skin: warm, dry     Labs on Admission:   Recent Labs  06/21/13 1547  NA 139  K 3.7  CL 102  CO2 23  GLUCOSE 85  BUN 14  CREATININE 0.86  CALCIUM 8.4   No results found for this basename: AST, ALT, ALKPHOS, BILITOT, PROT, ALBUMIN,  in the last 72 hours No results found for this basename: LIPASE, AMYLASE,  in the last 72 hours  Recent Labs  06/21/13 1547  WBC 9.6  NEUTROABS 6.4  HGB 12.8  HCT 36.8  MCV 96.1  PLT 201   No results found for this basename: CKTOTAL, CKMB, CKMBINDEX,  TROPONINI,  in the last 72 hours Lab Results  Component Value Date   INR 1.05 06/21/2013   No results found for this basename: TSH, T4TOTAL, FREET3, T3FREE, THYROIDAB,  in the last 72 hours No results found for this basename: VITAMINB12, FOLATE, FERRITIN, TIBC, IRON, RETICCTPCT,  in the last 72 hours  Radiological Exams on Admission: Ct Angio Chest W/cm &/or Wo Cm  06/21/2013   CLINICAL DATA:  Right leg pain, elevated D-dimer, possible PE  EXAM: CT ANGIOGRAPHY CHEST WITH CONTRAST  TECHNIQUE: Multidetector CT imaging of the chest was performed using the standard protocol during bolus administration of intravenous contrast. Multiplanar CT image reconstructions and MIPs were obtained to evaluate the vascular anatomy.  CONTRAST:  144mL OMNIPAQUE IOHEXOL 350 MG/ML SOLN  COMPARISON:  01/22/2009.  FINDINGS: The study is of excellent technical quality. Again noted right aortic arch. Heart size is stable. No pericardial effusion. No mediastinal hematoma or adenopathy.  The study is positive for pulmonary emboli seen in right lower lobe branches see axial image 46 also thrombosis is noted in at least 3 segmental branches in left lower lobe. Axial image 40 and 37.  Sagittal images of the spine shows diffuse osteopenia. Degenerative changes mid and lower thoracic spine. Sagittal view of the sternum is unremarkable.  The visualized upper abdomen shows stable low-density lesion within left hepatic lobe. Partial fatty replacement of the pancreas again noted. No adrenal gland mass is noted.  No calcified gallstones are noted within visualized gallbladder.  Images of the lung parenchyma shows no acute infiltrate or pleural effusion. No pulmonary edema.  **Positive for acute PE with CT evidence of right heart strain (RV/LV Ratio = 0.9) consistent with at least submassive (intermediate risk) PE. The presence of right heart strain has been associated with an increased risk of morbidity and mortality. Consultation with Pulmonary  and Critical Care Medicine is recommended.  Review of the MIP images confirms the above findings.  IMPRESSION: 1. There are bilateral pulmonary emboli in bilateral lower lobe segmental pulmonary artery branches as described above. No evidence of lung infarction or focal consolidation. **Positive for acute PE with CT evidence of right heart strain (RV/LV Ratio = 0.9) consistent with at least submassive (intermediate risk) PE. The presence of right heart strain has been associated with an increased risk of morbidity and mortality. Consultation with Pulmonary and Critical Care Medicine is recommended.  1. No mediastinal hematoma or adenopathy. 2. No acute infiltrate or pulmonary edema. 3. Degenerative changes thoracic spine. 4. Again noted right aortic arch. I discussed with Dr. Silvio Clayman   Electronically Signed   By: Lahoma Crocker M.D.   On: 06/21/2013  17:56   No orders found for this or any previous visit.  Assessment/Plan Active Problems:   Pulmonary embolism  - patient will be admitted for close monitoring in a telemetry bed. Critical care medicine was curbsided who rec'd getting TTE to further eval R heart strain, as CTA is a poor image to use for determining R heart strain. No need for formal consult given no hypoxia or hypotension. She can have supplemental O2 if needed, however not currently the case. VSS. xarelto started in ED and will be continued. Hypercoag profile pending. CTA noted as showing R heart strain, will check TTE to further analyze this. If R heart strain, will consider IR consult to further consider IVC filter placement. If no R heart strain, will be ready for discharge as long as vitals remain stable.   Regular diet  Dispo - pending TTE results   Brandonn Capelli 06/21/2013, 7:58 PM

## 2013-06-21 NOTE — Progress Notes (Addendum)
Patient arrived to 2-west from ED. VS stable, no complaints from patient. Stated that she wanted some food. No sign and held orders.  Attending MD paged 2x to notify of patients arrival to the floor. CURRY, TEAL R    I have received zero pages. I can be contacted at my office number of [phone]. I will be seeing the patient this evening as I clearly told the ED physician. Velna Hatchet, MD 06/21/2013 7:59 PM

## 2013-06-21 NOTE — Assessment & Plan Note (Signed)
Equivocal Homan's. No prior hx DVT Plan- D-dimer

## 2013-06-21 NOTE — Progress Notes (Signed)
VASCULAR LAB PRELIMINARY  PRELIMINARY  PRELIMINARY  PRELIMINARY  Bilateral lower extremity venous duplex completed.    Preliminary report:  Right - Positive for deep vein thrombosis involving the posterior tibial. Popliteal and distal to mid femoral veins. No evidence of superficial thrombosis or Baker's cyst. Left - No evidence of deep vein thrombosis, superficial throbus, or Baker's cyst  Karn Derk, RVS 06/21/2013, 1:46 PM

## 2013-06-21 NOTE — Discharge Instructions (Signed)
Deep Vein Thrombosis  A deep vein thrombosis (DVT) is a blood clot that develops in the deep, larger veins of the leg, arm, or pelvis. These are more dangerous than clots that might form in veins near the surface of the body. A DVT can lead to complications if the clot breaks off and travels in the bloodstream to the lungs.   A DVT can damage the valves in your leg veins, so that instead of flowing upward, the blood pools in the lower leg. This is called post-thrombotic syndrome, and it can result in pain, swelling, discoloration, and sores on the leg.  CAUSES  Usually, several things contribute to blood clots forming. Contributing factors include:   The flow of blood slows down.   The inside of the vein is damaged in some way.   You have a condition that makes blood clot more easily.  RISK FACTORS  Some people are more likely than others to develop blood clots. Risk factors include:    Older age, especially over 75 years of age.   Having a family history of blood clots or if you have already had a blot clot.   Having major or lengthy surgery. This is especially true for surgery on the hip, knee, or belly (abdomen). Hip surgery is particularly high risk.   Breaking a hip or leg.   Sitting or lying still for a long time. This includes long-distance travel, paralysis, or recovery from an illness or surgery.   Having cancer or cancer treatment.   Having a long, thin tube (catheter) placed inside a vein during a medical procedure.   Being overweight (obese).   Pregnancy and childbirth.   Hormone changes make the blood clot more easily during pregnancy.   The fetus puts pressure on the veins of the pelvis.   There is a risk of injury to veins during delivery or a caesarean. The risk is highest just after childbirth.   Medicines with the female hormone estrogen. This includes birth control pills and hormone replacement therapy.   Smoking.   Other circulation or heart problems.    SIGNS AND SYMPTOMS  When  a clot forms, it can either partially or totally block the blood flow in that vein. Symptoms of a DVT can include:   Swelling of the leg or arm, especially if one side is much worse.   Warmth and redness of the leg or arm, especially if one side is much worse.   Pain in an arm or leg. If the clot is in the leg, symptoms may be more noticeable or worse when standing or walking.  The symptoms of a DVT that has traveled to the lungs (pulmonary embolism, PE) usually start suddenly and include:   Shortness of breath.   Coughing.   Coughing up blood or blood-tinged phlegm.   Chest pain. The chest pain is often worse with deep breaths.   Rapid heartbeat.  Anyone with these symptoms should get emergency medical treatment right away. Call your local emergency services (911 in the [place] if you have these symptoms.  DIAGNOSIS  If a DVT is suspected, your health care provider will take a full medical history and perform a physical exam. Tests that also may be required include:   Blood tests, including studies of the clotting properties of the blood.   Ultrasonography to see if you have clots in your legs or lungs.   X-rays to show the flow of blood when dye is injected into the veins (  venography).   Studies of your lungs if you have any chest symptoms.  PREVENTION   Exercise the legs regularly. Take a brisk 30-minute walk every day.   Maintain a weight that is appropriate for your height.   Avoid sitting or lying in bed for long periods of time without moving your legs.   Women, particularly those over the age of 35 years, should consider the risks and benefits of taking estrogen medicines, including birth control pills.   Do not smoke, especially if you take estrogen medicines.   Long-distance travel can increase your risk of DVT. You should exercise your legs by walking or pumping the muscles every hour.   In-hospital prevention:   Many of the risk factors above relate to situations that exist with  hospitalization, either for illness, injury, or elective surgery.   Your health care provider will assess you for the need for venous thromboembolism prophylaxis when you are admitted to the hospital. If you are having surgery, your surgeon will assess you the day of or day after surgery.   Prevention may include medical and nonmedical measures.  TREATMENT  Once identified, a DVT can be treated. It can also be prevented in some circumstances. Once you have had a DVT, you may be at increased risk for a DVT in the future. The most common treatment for DVT is blood thinning (anticoagulant) medicine, which reduces the blood's tendency to clot. Anticoagulants can stop new blood clots from forming and stop old ones from growing. They cannot dissolve existing clots. Your body does this by itself over time. Anticoagulants can be given by mouth, by IV access, or by injection. Your health care provider will determine the best program for you. Other medicines or treatments that may be used are:   Heparin or related medicines (low molecular weight heparin) are usually the first treatment for a blood clot. They act quickly. However, they cannot be taken orally.   Heparin can cause a fall in a component of blood that stops bleeding and forms blood clots (platelets). You will be monitored with blood tests to be sure this does not occur.   Warfarin is an anticoagulant that can be swallowed. It takes a few days to start working, so usually heparin or related medicines are used in combination. Once warfarin is working, heparin is usually stopped.   Less commonly, clot dissolving drugs (thrombolytics) are used to dissolve a DVT. They carry a high risk of bleeding, so they are used mainly in severe cases, where your life or a limb is threatened.   Very rarely, a blood clot in the leg needs to be removed surgically.   If you are unable to take anticoagulants, your health care provider may arrange for you to have a filter placed  in a main vein in your abdomen. This filter prevents clots from traveling to your lungs.  HOME CARE INSTRUCTIONS   Take all medicines prescribed by your health care provider. Only take over-the-counter or prescription medicines for pain, fever, or discomfort as directed by your health care provider.   Warfarin. Most people will continue taking warfarin after hospital discharge. Your health care provider will advise you on the length of treatment (usually 3 6 months, sometimes lifelong).   Too much and too little warfarin are both dangerous. Too much warfarin increases the risk of bleeding. Too little warfarin continues to allow the risk for blood clots. While taking warfarin, you will need to have regular blood tests to measure your   blood clotting time. These blood tests usually include both the prothrombin time (PT) and international normalized ratio (INR) tests. The PT and INR results allow your health care provider to adjust your dose of warfarin. The dose can change for many reasons. It is critically important that you take warfarin exactly as prescribed, and that you have your PT and INR levels drawn exactly as directed.   Many foods, especially foods high in vitamin K, can interfere with warfarin and affect the PT and INR results. Foods high in vitamin K include spinach, kale, broccoli, cabbage, collard and turnip greens, brussel sprouts, peas, cauliflower, seaweed, and parsley as well as beef and pork liver, green tea, and soybean oil. You should eat a consistent amount of foods high in vitamin K. Avoid major changes in your diet, or notify your health care provider before changing your diet. Arrange a visit with a dietitian to answer your questions.   Many medicines can interfere with warfarin and affect the PT and INR results. You must tell your health care provider about any and all medicines you take. This includes all vitamins and supplements. Be especially cautious with aspirin and  anti-inflammatory medicines. Ask your health care provider before taking these. Do not take or discontinue any prescribed or over-the-counter medicine except on the advice of your health care provider or pharmacist.   Warfarin can have side effects, primarily excessive bruising or bleeding. You will need to hold pressure over cuts for longer than usual. Your health care provider or pharmacist will discuss other potential side effects.   Alcohol can change the body's ability to handle warfarin. It is best to avoid alcoholic drinks or consume only very small amounts while taking warfarin. Notify your health care provider if you change your alcohol intake.   Notify your dentist or other health care providers before procedures.   Activity. Ask your health care provider how soon you can go back to normal activities. It is important to stay active to prevent blood clots. If you are on anticoagulant medicine, avoid contact sports.   Exercise. It is very important to exercise. This is especially important while traveling, sitting, or standing for long periods of time. Exercise your legs by walking or by pumping the muscles frequently. Take frequent walks.   Compression stockings. These are tight elastic stockings that apply pressure to the lower legs. This pressure can help keep the blood in the legs from clotting. You may need to wear compression stockings at home to help prevent a DVT.   Do not smoke. If you smoke, quit. Ask your health care provider for help with quitting smoking.   Learn as much as you can about DVT. Knowing more about the condition should help you keep it from coming back.   Wear a medical alert bracelet or carry a medical alert card.  SEEK MEDICAL CARE IF:   You notice a rapid heartbeat.   You feel weaker or more tired than usual.   You feel faint.   You notice increased bruising.   You feel your symptoms are not getting better in the time expected.   You believe you are having side  effects of medicine.  SEEK IMMEDIATE MEDICAL CARE IF:   You have chest pain.   You have trouble breathing.   You have new or increased swelling or pain in one leg.   You cough up blood.   You notice blood in vomit, in a bowel movement, or in urine.  MAKE SURE   YOU:   Understand these instructions.   Will watch your condition.   Will get help right away if you are not doing well or get worse.  Document Released: 03/03/2005 Document Revised: 12/22/2012 Document Reviewed: 11/08/2012  [employer] Patient Information 2014 [employer].

## 2013-06-21 NOTE — ED Provider Notes (Signed)
1600 - Care from Dr. Migliaccio Singer. DVT found today, awaiting PE study for some SOB and cough. No hemoptysis. Normal vitals.  CT PE shows bilateral multiple PEs with evidence of R heart strain. Already on Xarelto. Dr. Ardeth Perfect with [facility] admitting.  1. Right calf pain   2. Chest pain, unspecified   3. Cough   4. DVT (deep venous thrombosis)   5. Pulmonary embolism, bilateral      Osvaldo Shipper, MD 06/21/13 [phone]

## 2013-06-22 DIAGNOSIS — I517 Cardiomegaly: Secondary | ICD-10-CM

## 2013-06-22 LAB — CARDIOLIPIN ANTIBODIES, IGG, IGM, IGA
Anticardiolipin IgA: 10 APL U/mL — ABNORMAL LOW (ref <22)
Anticardiolipin IgG: 6 GPL U/mL — ABNORMAL LOW (ref <23)
Anticardiolipin IgM: 1 MPL U/mL — ABNORMAL LOW (ref <11)

## 2013-06-22 LAB — PROTEIN S, TOTAL: Protein S Ag, Total: 87 % (ref 60–150)

## 2013-06-22 LAB — BETA-2-GLYCOPROTEIN I ABS, IGG/M/A
Beta-2 Glyco I IgG: 14 G Units (ref <20)
Beta-2-Glycoprotein I IgA: 10 A Units (ref <20)
Beta-2-Glycoprotein I IgM: 6 M Units (ref <20)

## 2013-06-22 LAB — CBC
HEMATOCRIT: 35.3 % — AB (ref 36.0–46.0)
Hemoglobin: 12 g/dL (ref 12.0–15.0)
MCH: 32.5 pg (ref 26.0–34.0)
MCHC: 34 g/dL (ref 30.0–36.0)
MCV: 95.7 fL (ref 78.0–100.0)
Platelets: 194 10*3/uL (ref 150–400)
RBC: 3.69 MIL/uL — ABNORMAL LOW (ref 3.87–5.11)
RDW: 12.4 % (ref 11.5–15.5)
WBC: 8.2 10*3/uL (ref 4.0–10.5)

## 2013-06-22 LAB — BASIC METABOLIC PANEL
BUN: 13 mg/dL (ref 6–23)
CHLORIDE: 101 meq/L (ref 96–112)
CO2: 20 mEq/L (ref 19–32)
Calcium: 8.6 mg/dL (ref 8.4–10.5)
Creatinine, Ser: 0.82 mg/dL (ref 0.50–1.10)
GFR, EST AFRICAN AMERICAN: 86 mL/min — AB (ref >90)
GFR, EST NON AFRICAN AMERICAN: 75 mL/min — AB (ref >90)
Glucose, Bld: 108 mg/dL — ABNORMAL HIGH (ref 70–99)
POTASSIUM: 4.1 meq/L (ref 3.7–5.3)
Sodium: 136 mEq/L — ABNORMAL LOW (ref 137–147)

## 2013-06-22 LAB — LUPUS ANTICOAGULANT PANEL
DRVVT: 40.8 secs (ref <42.9)
Lupus Anticoagulant: DETECTED — AB
PTT Lupus Anticoagulant: 53.9 secs — ABNORMAL HIGH (ref 28.0–43.0)
PTTLA 4:1 Mix: 52.1 secs — ABNORMAL HIGH (ref 28.0–43.0)
PTTLA Confirmation: 20.7 secs — ABNORMAL HIGH (ref <8.0)

## 2013-06-22 LAB — PROTEIN C, TOTAL: Protein C, Total: 72 % (ref 72–160)

## 2013-06-22 LAB — PROTEIN S ACTIVITY: Protein S Activity: 98 % (ref 69–129)

## 2013-06-22 LAB — HOMOCYSTEINE: Homocysteine: 15 umol/L (ref 4.0–15.4)

## 2013-06-22 LAB — FACTOR 5 LEIDEN

## 2013-06-22 LAB — PROTEIN C ACTIVITY: Protein C Activity: 123 % (ref 75–133)

## 2013-06-22 MED ORDER — RIVAROXABAN 15 MG PO TABS: ORAL_TABLET | ORAL | Status: DC

## 2013-06-22 MED ORDER — RIVAROXABAN 15 MG PO TABS: 15.0000 mg | ORAL_TABLET | Freq: Two times a day (BID) | ORAL | Status: DC

## 2013-06-22 MED ORDER — OXYCODONE-ACETAMINOPHEN 5-325 MG PO TABS
1.0000 | ORAL_TABLET | Freq: Four times a day (QID) | ORAL | Status: DC | PRN
  Administered 2013-06-22: 1 via ORAL
  Filled 2013-06-22: qty 1

## 2013-06-22 MED ORDER — HYDROCODONE-ACETAMINOPHEN 5-325 MG PO TABS
1.0000 | ORAL_TABLET | Freq: Four times a day (QID) | ORAL | Status: DC
  Administered 2013-06-22: 1 via ORAL
  Filled 2013-06-22: qty 1

## 2013-06-22 NOTE — Progress Notes (Signed)
Physician Daily Progress Note  Subjective: Remains w/o O2 requirement or hypotension No events overnight  Objective: Vital signs in last 24 hours: Temp:  [98.3 F (36.8 C)-98.7 F (37.1 C)] 98.7 F (37.1 C) (04/08 0435) Pulse Rate:  [73-95] 76 (04/08 0435) Resp:  [18-20] 18 (04/08 0435) BP: (101-138)/(61-99) 101/61 mmHg (04/08 0435) SpO2:  [94 %-100 %] 94 % (04/08 0435) Weight:  [111.403 kg (245 lb 9.6 oz)] 111.403 kg (245 lb 9.6 oz) (04/07 1846) Weight change:     CBG (last 3)  No results found for this basename: GLUCAP,  in the last 72 hours  Intake/Output from previous day: No intake or output data in the 24 hours ending 06/22/13 0707    Physical Exam Gen: WF in NAD  HEENT: anicteric , mmm  Lungs: CTAB, no wheezes, rales  Cardio: RRR , no mRG  Abd: soft,NT,ND  MSK: no focal deficits  Neuro: No focal deficits  Skin: warm, dry     Lab Results:  Recent Labs  06/21/13 1547 06/22/13 0415  NA 139 136*  K 3.7 4.1  CL 102 101  CO2 23 20  GLUCOSE 85 108*  BUN 14 13  CREATININE 0.86 0.82  CALCIUM 8.4 8.6    No results found for this basename: AST, ALT, ALKPHOS, BILITOT, PROT, ALBUMIN,  in the last 72 hours   Recent Labs  06/21/13 1547 06/22/13 0415  WBC 9.6 8.2  NEUTROABS 6.4  --   HGB 12.8 12.0  HCT 36.8 35.3*  MCV 96.1 95.7  PLT 201 194    Lab Results  Component Value Date   INR 1.05 06/21/2013    No results found for this basename: CKTOTAL, CKMB, CKMBINDEX, TROPONINI,  in the last 72 hours  No results found for this basename: TSH, T4TOTAL, FREET3, T3FREE, THYROIDAB,  in the last 72 hours  No results found for this basename: VITAMINB12, FOLATE, FERRITIN, TIBC, IRON, RETICCTPCT,  in the last 72 hours  Micro Results: No results found for this or any previous visit (from the past 240 hour(s)).  Studies/Results: Ct Angio Chest W/cm &/or Wo Cm  06/21/2013   CLINICAL DATA:  Right leg pain, elevated D-dimer, possible PE  EXAM: CT ANGIOGRAPHY  CHEST WITH CONTRAST  TECHNIQUE: Multidetector CT imaging of the chest was performed using the standard protocol during bolus administration of intravenous contrast. Multiplanar CT image reconstructions and MIPs were obtained to evaluate the vascular anatomy.  CONTRAST:  185mL OMNIPAQUE IOHEXOL 350 MG/ML SOLN  COMPARISON:  01/22/2009.  FINDINGS: The study is of excellent technical quality. Again noted right aortic arch. Heart size is stable. No pericardial effusion. No mediastinal hematoma or adenopathy.  The study is positive for pulmonary emboli seen in right lower lobe branches see axial image 46 also thrombosis is noted in at least 3 segmental branches in left lower lobe. Axial image 40 and 37.  Sagittal images of the spine shows diffuse osteopenia. Degenerative changes mid and lower thoracic spine. Sagittal view of the sternum is unremarkable.  The visualized upper abdomen shows stable low-density lesion within left hepatic lobe. Partial fatty replacement of the pancreas again noted. No adrenal gland mass is noted.  No calcified gallstones are noted within visualized gallbladder.  Images of the lung parenchyma shows no acute infiltrate or pleural effusion. No pulmonary edema.  **Positive for acute PE with CT evidence of right heart strain (RV/LV Ratio = 0.9) consistent with at least submassive (intermediate risk) PE. The presence of right heart strain has  been associated with an increased risk of morbidity and mortality. Consultation with Pulmonary and Critical Care Medicine is recommended.  Review of the MIP images confirms the above findings.  IMPRESSION: 1. There are bilateral pulmonary emboli in bilateral lower lobe segmental pulmonary artery branches as described above. No evidence of lung infarction or focal consolidation. **Positive for acute PE with CT evidence of right heart strain (RV/LV Ratio = 0.9) consistent with at least submassive (intermediate risk) PE. The presence of right heart strain has been  associated with an increased risk of morbidity and mortality. Consultation with Pulmonary and Critical Care Medicine is recommended.  1. No mediastinal hematoma or adenopathy. 2. No acute infiltrate or pulmonary edema. 3. Degenerative changes thoracic spine. 4. Again noted right aortic arch. I discussed with Dr. Silvio Clayman   Electronically Signed   By: Lahoma Crocker M.D.   On: 06/21/2013 17:56     Medications: Scheduled: . budesonide-formoterol  2 puff Inhalation BID  . famotidine  40 mg Oral QHS  . pantoprazole  40 mg Oral Daily  . Rivaroxaban  15 mg Oral BID WC  . sodium chloride  3 mL Intravenous Q12H   Continuous:   Assessment/Plan: Active Problems:   Pulmonary embolism  - TTE ordered to eval R heart strain. On Tele. On xarelto 15mg  BID x 21 days. VSS. If TTE w/o strain, home today. If has R heart strain, consult IR for possible IVC filter placement     LOS: 1 day   Renna Kilmer 06/22/2013, 7:07 AM

## 2013-06-22 NOTE — Discharge Summary (Signed)
Physician Discharge Summary    Diane Barker  MR#: [id number]  DOB:[birth year]  Date of Admission: 06/21/2013 Date of Discharge: 06/22/2013  Attending Physician:Ovidio Steele  Patient's HQI:ONGEX,BMWU M, MD  Discharge Diagnoses: Active Problems:   Pulmonary embolism   Discharge Medications:   Medication List         azithromycin 250 MG tablet  Commonly known as:  ZITHROMAX  Take 1 tablet (250 mg total) by mouth daily.     cholecalciferol 1000 UNITS tablet  Commonly known as:  VITAMIN D  Take 1,000 Units by mouth daily.     dexlansoprazole 60 MG capsule  Commonly known as:  DEXILANT  Take 60 mg by mouth daily.     fluticasone 50 MCG/ACT nasal spray  Commonly known as:  FLONASE  Place 1 spray into both nostrils daily as needed for allergies.     promethazine-codeine 6.25-10 MG/5ML syrup  Commonly known as:  PHENERGAN with CODEINE  Take 5 mLs by mouth every 6 (six) hours as needed for cough.     ranitidine 150 MG tablet  Commonly known as:  ZANTAC  Take 150 mg by mouth at bedtime.     Rivaroxaban 15 MG Tabs tablet  Commonly known as:  XARELTO  Take 1 tablet (15 mg total) by mouth 2 (two) times daily with a meal.     VITAMIN B-COMPLEX 100 Inj  Inject as directed every 30 (thirty) days.        Hospital Procedures: Ct Angio Chest W/cm &/or Wo Cm  06/21/2013   CLINICAL DATA:  Right leg pain, elevated D-dimer, possible PE  EXAM: CT ANGIOGRAPHY CHEST WITH CONTRAST  TECHNIQUE: Multidetector CT imaging of the chest was performed using the standard protocol during bolus administration of intravenous contrast. Multiplanar CT image reconstructions and MIPs were obtained to evaluate the vascular anatomy.  CONTRAST:  190mL OMNIPAQUE IOHEXOL 350 MG/ML SOLN  COMPARISON:  01/22/2009.  FINDINGS: The study is of excellent technical quality. Again noted right aortic arch. Heart size is stable. No pericardial effusion. No mediastinal hematoma or adenopathy.  The study is positive for  pulmonary emboli seen in right lower lobe branches see axial image 46 also thrombosis is noted in at least 3 segmental branches in left lower lobe. Axial image 40 and 37.  Sagittal images of the spine shows diffuse osteopenia. Degenerative changes mid and lower thoracic spine. Sagittal view of the sternum is unremarkable.  The visualized upper abdomen shows stable low-density lesion within left hepatic lobe. Partial fatty replacement of the pancreas again noted. No adrenal gland mass is noted.  No calcified gallstones are noted within visualized gallbladder.  Images of the lung parenchyma shows no acute infiltrate or pleural effusion. No pulmonary edema.  **Positive for acute PE with CT evidence of right heart strain (RV/LV Ratio = 0.9) consistent with at least submassive (intermediate risk) PE. The presence of right heart strain has been associated with an increased risk of morbidity and mortality. Consultation with Pulmonary and Critical Care Medicine is recommended.  Review of the MIP images confirms the above findings.  IMPRESSION: 1. There are bilateral pulmonary emboli in bilateral lower lobe segmental pulmonary artery branches as described above. No evidence of lung infarction or focal consolidation. Positive for acute PE with CT evidence of right heart strain (RV/LV Ratio = 0.9) consistent with at least submassive (intermediate risk) PE. The presence of right heart strain has been associated with an increased risk of morbidity and mortality. Consultation with Pulmonary and Critical  Care Medicine is recommended.  1. No mediastinal hematoma or adenopathy. 2. No acute infiltrate or pulmonary edema. 3. Degenerative changes thoracic spine. 4. Again noted right aortic arch. I discussed with Dr. Silvio Clayman   Electronically Signed   By: Lahoma Crocker M.D.   On: 06/21/2013 17:56    History of Present Illness: Patient admitted for PE and DVT , found to have possible R heart strain on CTA   Hospital Course: PE / DVT -  TTE showed no evidence of R heart strain, safe for discharge on xarelto w/ close outpt f/u. Her vitals have remained stable w/o hypotension or increased O2 requirement. She will be given a 30 days free card for xarelto prior to discharge. Hypercoag w/u pending   Day of Discharge Exam BP 101/61  Pulse 76  Temp(Src) 98.7 F (37.1 C) (Oral)  Resp 18  Ht 5\' 5"  (1.651 m)  Wt 111.403 kg (245 lb 9.6 oz)  BMI 40.87 kg/m2  SpO2 94%  Physical Exam: General appearance: WF in NAD  Eyes: no scleral icterus Throat: oropharynx moist without erythema Resp: CTAB, no wheezes  Cardio: RRR, no MRG  GI: soft, non-tender; bowel sounds normal; no masses,  no organomegaly Extremities: no clubbing, cyanosis or edema  Discharge Labs:  Recent Labs  06/21/13 1547 06/22/13 0415  NA 139 136*  K 3.7 4.1  CL 102 101  CO2 23 20  GLUCOSE 85 108*  BUN 14 13  CREATININE 0.86 0.82  CALCIUM 8.4 8.6   No results found for this basename: AST, ALT, ALKPHOS, BILITOT, PROT, ALBUMIN,  in the last 72 hours  Recent Labs  06/21/13 1547 06/22/13 0415  WBC 9.6 8.2  NEUTROABS 6.4  --   HGB 12.8 12.0  HCT 36.8 35.3*  MCV 96.1 95.7  PLT 201 194   Lab Results  Component Value Date   INR 1.05 06/21/2013   No results found for this basename: CKTOTAL, CKMB, CKMBINDEX, TROPONINI,  in the last 72 hours No results found for this basename: TSH, T4TOTAL, FREET3, T3FREE, THYROIDAB,  in the last 72 hours No results found for this basename: VITAMINB12, FOLATE, FERRITIN, TIBC, IRON, RETICCTPCT,  in the last 72 hours  Discharge instructions:     Discharge Orders   Future Appointments Provider Department Dept Phone   06/21/2014 10:45 AM Deneise Lever, MD [facility] [phone]   Future Orders Complete By Expires   Diet - low sodium heart healthy  As directed    Discharge instructions  As directed    Increase activity slowly  As directed      01-Home or Self Care Follow-up Information   Schedule an  appointment as soon as possible for a visit with Precious Reel, MD.   Specialty:  Internal Medicine   Contact information:   [facility] [place] [address] [phone]       Disposition: home   Follow-up Appts: Follow-up with Dr. Virgina Jock at [facility] in 1-2 days.We will  Call for appointment.  Condition on Discharge: stable   Tests Needing Follow-up:hypercoag panel   Time spent in discharge (includes decision making & examination of pt): 45 minutes    Signed: Velna Hatchet 06/22/2013, 12:39 PM

## 2013-06-22 NOTE — Progress Notes (Signed)
  Echocardiogram 2D Echocardiogram has been performed.  Valinda Hoar 06/22/2013, 9:16 AM

## 2013-06-22 NOTE — Care Management Note (Signed)
    Page 1 of 1   06/22/2013     2:44:51 PM   CARE MANAGEMENT NOTE 06/22/2013  Patient:  Diane Barker, Diane Barker   Account Number:  [id number]  Date Initiated:  06/22/2013  Documentation initiated by:  Rosabell Geyer  Subjective/Objective Assessment:   PT ADM ON 4/7 WITH BILAT PE AND DVT.  PTA, PT INDEPENDENT OF ADLS.     Action/Plan:   PT TO DC ON XARELTO.  PT GIVEN 12 MONTH FREE COPAY CARD. CARD HAS BEEN ACTIVATED.  SHE IS APPRECIATIVE OF HELP.   Anticipated DC Date:  06/22/2013   Anticipated DC Plan:  [place]  CM consult  Medication Assistance      Choice offered to / List presented to:             Status of service:  Completed, signed off Medicare Important Message given?   (If response is "NO", the following Medicare IM given date fields will be blank) Date Medicare IM given:   Date Additional Medicare IM given:    Discharge Disposition:  HOME/SELF CARE  Per UR Regulation:  Reviewed for med. necessity/level of care/duration of stay  If discussed at [place] of Stay Meetings, dates discussed:    Comments:

## 2013-06-23 LAB — PROTHROMBIN GENE MUTATION

## 2013-07-04 NOTE — ED Provider Notes (Signed)
CSN: [id number]     Arrival date & time 06/21/13  1150 History   First MD Initiated Contact with Patient 06/21/13 1224     Chief Complaint  Patient presents with  . DVT     (Consider location/radiation/quality/duration/timing/severity/associated sxs/prior Treatment) HPI  63 year old female with atraumatic right lower extremity pain. Patient reports intermittent history of pain in her leg over the past several months, but is acutely worse the past 2 days. Associated with swelling and mild redness. No fevers or chills. No recent surgery, recent immobilization or known history of DVT/PE. Past history of breast cancer status post left main and chemotherapy but this is over 10 years ago. Mild shortness of breath. No chest pain. No cough.Elevated d-dimer drawn PTA.    Past Medical History  Diagnosis Date  . Breast cancer 2000    Rt lumpectomy, Chemo/XRT/rt axillary node   . Depression   . Low back pain   . Arthritis   . Obesity   . Diverticulosis   . Hx of adenomatous colonic polyps   . GERD (gastroesophageal reflux disease)    Past Surgical History  Procedure Laterality Date  . Tubal ligation    . Breast lumpectomy      for CA txed w/ chemo and radiation, right  . Umbilical hernia repair  1983   Family History  Problem Relation Age of Onset  . Asthma Mother   . COPD Mother   . Lung cancer Father   . Asthma Daughter   . Kidney failure Brother   . Diabetes Brother     x 2  . Heart attack Brother   . Heart attack Mother   . Breast cancer Maternal Aunt   . Colon cancer Paternal Aunt   . Diabetes Daughter     x 2  . Diabetes Mother   . Hypertension Brother    History  Substance Use Topics  . Smoking status: Never Smoker   . Smokeless tobacco: Never Used  . Alcohol Use: No   OB History   Grav Para Term Preterm Abortions TAB SAB Ect Mult Living                 Review of Systems  All systems reviewed and negative, other than as noted in HPI.   Allergies   Erythromycin; Morphine; Penicillins; and Relafen  Home Medications   Prior to Admission medications   Medication Sig Start Date End Date Taking? Authorizing Provider  azithromycin (ZITHROMAX) 250 MG tablet Take 1 tablet (250 mg total) by mouth daily. 06/20/13  Yes Deneise Lever, MD  B Complex Vitamins (VITAMIN B-COMPLEX 100) INJ Inject as directed every 30 (thirty) days.   Yes Historical Provider, MD  cholecalciferol (VITAMIN D) 1000 UNITS tablet Take 1,000 Units by mouth daily.   Yes Historical Provider, MD  dexlansoprazole (DEXILANT) 60 MG capsule Take 60 mg by mouth daily. 09/19/11 06/21/13 Yes Lafayette Dragon, MD  fluticasone (FLONASE) 50 MCG/ACT nasal spray Place 1 spray into both nostrils daily as needed for allergies.    Yes Historical Provider, MD  promethazine-codeine (PHENERGAN WITH CODEINE) 6.25-10 MG/5ML syrup Take 5 mLs by mouth every 6 (six) hours as needed for cough. 06/20/13  Yes Deneise Lever, MD  ranitidine (ZANTAC) 150 MG tablet Take 150 mg by mouth at bedtime.   Yes Historical Provider, MD  Rivaroxaban (XARELTO) 15 MG TABS tablet Take 1 tablet (15 mg total) by mouth 2 (two) times daily with a meal. 06/22/13   Scott  Holwerda, MD   BP 101/61  Pulse 76  Temp(Src) 98.7 F (37.1 C) (Oral)  Resp 18  Ht 5\' 5"  (1.651 m)  Wt 245 lb 9.6 oz (111.403 kg)  BMI 40.87 kg/m2  SpO2 94% Physical Exam  Nursing note and vitals reviewed. Constitutional: She appears well-developed and well-nourished. No distress.  Laying in bed. NAD. Obese.   HENT:  Head: Normocephalic and atraumatic.  Eyes: Conjunctivae are normal. Right eye exhibits no discharge. Left eye exhibits no discharge.  Neck: Neck supple.  Cardiovascular: Normal rate, regular rhythm and normal heart sounds.  Exam reveals no gallop and no friction rub.   No murmur heard. Pulmonary/Chest: Effort normal and breath sounds normal. No respiratory distress.  Abdominal: Soft. She exhibits no distension. There is no tenderness.   Musculoskeletal: She exhibits no edema and no tenderness.  R calf tenderness. No palpable cords. Mild swelling as compared to L. Neg homans.   Neurological: She is alert.  Skin: Skin is warm and dry.  Psychiatric: She has a normal mood and affect. Her behavior is normal. Thought content normal.    ED Course  Procedures (including critical care time) Labs Review Labs Reviewed  BASIC METABOLIC PANEL - Abnormal; Notable for the following:    GFR calc non Af Amer 70 (*)    GFR calc Af Amer 82 (*)    All other components within normal limits  CBC WITH DIFFERENTIAL - Abnormal; Notable for the following:    RBC 3.83 (*)    Monocytes Absolute 1.1 (*)    All other components within normal limits  LUPUS ANTICOAGULANT PANEL - Abnormal; Notable for the following:    PTT Lupus Anticoagulant 53.9 (*)    PTTLA Confirmation 20.7 (*)    PTTLA 4:1 Mix 52.1 (*)    Lupus Anticoagulant DETECTED (*)    All other components within normal limits  CARDIOLIPIN ANTIBODIES, IGG, IGM, IGA - Abnormal; Notable for the following:    Anticardiolipin IgG 6 (*)    Anticardiolipin IgM 1 (*)    Anticardiolipin IgA 10 (*)    All other components within normal limits  BASIC METABOLIC PANEL - Abnormal; Notable for the following:    Sodium 136 (*)    Glucose, Bld 108 (*)    GFR calc non Af Amer 75 (*)    GFR calc Af Amer 86 (*)    All other components within normal limits  CBC - Abnormal; Notable for the following:    RBC 3.69 (*)    HCT 35.3 (*)    All other components within normal limits  PROTIME-INR  APTT  ANTITHROMBIN III  PROTEIN C ACTIVITY  PROTEIN C, TOTAL  PROTEIN S ACTIVITY  PROTEIN S, TOTAL  BETA-2-GLYCOPROTEIN I ABS, IGG/M/A  HOMOCYSTEINE  FACTOR 5 LEIDEN  PROTHROMBIN GENE MUTATION    Imaging Review No results found.  Ct Angio Chest W/cm &/or Wo Cm  06/21/2013   CLINICAL DATA:  Right leg pain, elevated D-dimer, possible PE  EXAM: CT ANGIOGRAPHY CHEST WITH CONTRAST  TECHNIQUE:  Multidetector CT imaging of the chest was performed using the standard protocol during bolus administration of intravenous contrast. Multiplanar CT image reconstructions and MIPs were obtained to evaluate the vascular anatomy.  CONTRAST:  165mL OMNIPAQUE IOHEXOL 350 MG/ML SOLN  COMPARISON:  01/22/2009.  FINDINGS: The study is of excellent technical quality. Again noted right aortic arch. Heart size is stable. No pericardial effusion. No mediastinal hematoma or adenopathy.  The study is positive for pulmonary emboli  seen in right lower lobe branches see axial image 46 also thrombosis is noted in at least 3 segmental branches in left lower lobe. Axial image 40 and 37.  Sagittal images of the spine shows diffuse osteopenia. Degenerative changes mid and lower thoracic spine. Sagittal view of the sternum is unremarkable.  The visualized upper abdomen shows stable low-density lesion within left hepatic lobe. Partial fatty replacement of the pancreas again noted. No adrenal gland mass is noted.  No calcified gallstones are noted within visualized gallbladder.  Images of the lung parenchyma shows no acute infiltrate or pleural effusion. No pulmonary edema.  Positive for acute PE with CT evidence of right heart strain (RV/LV Ratio = 0.9) consistent with at least submassive (intermediate risk) PE. The presence of right heart strain has been associated with an increased risk of morbidity and mortality. Consultation with Pulmonary and Critical Care Medicine is recommended.  Review of the MIP images confirms the above findings.  IMPRESSION: 1. There are bilateral pulmonary emboli in bilateral lower lobe segmental pulmonary artery branches as described above. No evidence of lung infarction or focal consolidation. Positive for acute PE with CT evidence of right heart strain (RV/LV Ratio = 0.9) consistent with at least submassive (intermediate risk) PE. The presence of right heart strain has been associated with an increased risk  of morbidity and mortality. Consultation with Pulmonary and Critical Care Medicine is recommended.  1. No mediastinal hematoma or adenopathy. 2. No acute infiltrate or pulmonary edema. 3. Degenerative changes thoracic spine. 4. Again noted right aortic arch. I discussed with Dr. Silvio Clayman   Electronically Signed   By: Lahoma Crocker M.D.   On: 06/21/2013 17:56    EKG Interpretation None      MDM   Final diagnoses:  DVT (deep venous thrombosis)  Pulmonary embolism, bilateral   63 year old female with atraumatic right lower extremity pain. Ultrasound significant for DVT. She is hemodynamically stable and oxygen saturations are adequate on room air. She's complaining of some shortness of breath though. Because of this, will obtain a CT angiogram. Hypercoagulable workup was drawn. Patient was given her first doses of xarleto after discussing treatment options.Case was discussed with Dr Mingo Amber in sign out with CT pending. I feel she can safely be treated as an outpt if CT neg.    Virgel Manifold, MD 07/04/13 [phone]

## 2013-08-25 ENCOUNTER — Telehealth: Payer: Self-pay | Admitting: *Deleted

## 2013-08-25 NOTE — Telephone Encounter (Signed)
Former pt of Dr. Jamse Arn and Dr. Lamonte Sakai,  States has new pain on left breast for past few days it feels sore.  She has already called to get appt w/ PCP tomorrow but wonders if she needs to come see Dr. Alvy Bimler first?  Instructed pt to see PCP if they can see her tomorrow so she can get order for mammogram as soon as possible.  Dr. Alvy Bimler is not in office tomorrow. Call us back if mammogram shows anything that Dr. Alvy Bimler needs to address. She verbalized understanding.

## 2013-08-26 ENCOUNTER — Other Ambulatory Visit: Payer: Self-pay | Admitting: Internal Medicine

## 2013-08-26 DIAGNOSIS — N644 Mastodynia: Secondary | ICD-10-CM

## 2013-09-05 ENCOUNTER — Ambulatory Visit
Admission: RE | Admit: 2013-09-05 | Discharge: 2013-09-05 | Disposition: A | Discharge status: Home / Self Care | Payer: 59 | Source: Ambulatory Visit | Attending: Internal Medicine | Admitting: Internal Medicine

## 2013-09-05 DIAGNOSIS — N644 Mastodynia: Secondary | ICD-10-CM

## 2013-11-13 ENCOUNTER — Emergency Department (HOSPITAL_COMMUNITY)
Admission: EM | Admit: 2013-11-13 | Discharge: 2013-11-13 | Disposition: A | Discharge status: Home / Self Care | Payer: 59 | Attending: Emergency Medicine | Admitting: Emergency Medicine

## 2013-11-13 ENCOUNTER — Encounter (HOSPITAL_COMMUNITY): Payer: Self-pay | Admitting: Emergency Medicine

## 2013-11-13 ENCOUNTER — Emergency Department (HOSPITAL_COMMUNITY): Payer: 59

## 2013-11-13 DIAGNOSIS — E669 Obesity, unspecified: Secondary | ICD-10-CM | POA: Insufficient documentation

## 2013-11-13 DIAGNOSIS — Z9889 Other specified postprocedural states: Secondary | ICD-10-CM | POA: Insufficient documentation

## 2013-11-13 DIAGNOSIS — Z880 Allergy status to penicillin: Secondary | ICD-10-CM | POA: Diagnosis not present

## 2013-11-13 DIAGNOSIS — N200 Calculus of kidney: Secondary | ICD-10-CM | POA: Insufficient documentation

## 2013-11-13 DIAGNOSIS — R109 Unspecified abdominal pain: Secondary | ICD-10-CM | POA: Insufficient documentation

## 2013-11-13 DIAGNOSIS — Z8659 Personal history of other mental and behavioral disorders: Secondary | ICD-10-CM | POA: Diagnosis not present

## 2013-11-13 DIAGNOSIS — Z853 Personal history of malignant neoplasm of breast: Secondary | ICD-10-CM | POA: Insufficient documentation

## 2013-11-13 DIAGNOSIS — Z860100 Personal history of colon polyps, unspecified: Secondary | ICD-10-CM | POA: Insufficient documentation

## 2013-11-13 DIAGNOSIS — Z79899 Other long term (current) drug therapy: Secondary | ICD-10-CM | POA: Insufficient documentation

## 2013-11-13 DIAGNOSIS — R110 Nausea: Secondary | ICD-10-CM | POA: Insufficient documentation

## 2013-11-13 DIAGNOSIS — Z7901 Long term (current) use of anticoagulants: Secondary | ICD-10-CM | POA: Insufficient documentation

## 2013-11-13 DIAGNOSIS — Z8739 Personal history of other diseases of the musculoskeletal system and connective tissue: Secondary | ICD-10-CM | POA: Insufficient documentation

## 2013-11-13 DIAGNOSIS — K219 Gastro-esophageal reflux disease without esophagitis: Secondary | ICD-10-CM | POA: Insufficient documentation

## 2013-11-13 LAB — CBC WITH DIFFERENTIAL/PLATELET
BASOS ABS: 0 10*3/uL (ref 0.0–0.1)
Basophils Relative: 1 % (ref 0–1)
EOS PCT: 1 % (ref 0–5)
Eosinophils Absolute: 0.1 10*3/uL (ref 0.0–0.7)
HCT: 39.1 % (ref 36.0–46.0)
Hemoglobin: 13.1 g/dL (ref 12.0–15.0)
Lymphocytes Relative: 22 % (ref 12–46)
Lymphs Abs: 1.5 10*3/uL (ref 0.7–4.0)
MCH: 32.5 pg (ref 26.0–34.0)
MCHC: 33.5 g/dL (ref 30.0–36.0)
MCV: 97 fL (ref 78.0–100.0)
MONO ABS: 0.5 10*3/uL (ref 0.1–1.0)
Monocytes Relative: 8 % (ref 3–12)
NEUTROS PCT: 68 % (ref 43–77)
Neutro Abs: 4.8 10*3/uL (ref 1.7–7.7)
Platelets: 228 10*3/uL (ref 150–400)
RBC: 4.03 MIL/uL (ref 3.87–5.11)
RDW: 12.9 % (ref 11.5–15.5)
WBC: 6.9 10*3/uL (ref 4.0–10.5)

## 2013-11-13 LAB — URINALYSIS, ROUTINE W REFLEX MICROSCOPIC
Bilirubin Urine: NEGATIVE
GLUCOSE, UA: NEGATIVE mg/dL
KETONES UR: NEGATIVE mg/dL
Leukocytes, UA: NEGATIVE
NITRITE: NEGATIVE
Protein, ur: NEGATIVE mg/dL
Specific Gravity, Urine: 1.017 (ref 1.005–1.030)
Urobilinogen, UA: 1 mg/dL (ref 0.0–1.0)
pH: 6.5 (ref 5.0–8.0)

## 2013-11-13 LAB — COMPREHENSIVE METABOLIC PANEL
ALT: 10 U/L (ref 0–35)
ANION GAP: 12 (ref 5–15)
AST: 15 U/L (ref 0–37)
Albumin: 3.5 g/dL (ref 3.5–5.2)
Alkaline Phosphatase: 88 U/L (ref 39–117)
BUN: 19 mg/dL (ref 6–23)
CALCIUM: 9 mg/dL (ref 8.4–10.5)
CO2: 24 meq/L (ref 19–32)
Chloride: 105 mEq/L (ref 96–112)
Creatinine, Ser: 1.49 mg/dL — ABNORMAL HIGH (ref 0.50–1.10)
GFR, EST AFRICAN AMERICAN: 42 mL/min — AB (ref >90)
GFR, EST NON AFRICAN AMERICAN: 36 mL/min — AB (ref >90)
GLUCOSE: 139 mg/dL — AB (ref 70–99)
Potassium: 4.6 mEq/L (ref 3.7–5.3)
SODIUM: 141 meq/L (ref 137–147)
TOTAL PROTEIN: 6.9 g/dL (ref 6.0–8.3)
Total Bilirubin: 0.4 mg/dL (ref 0.3–1.2)

## 2013-11-13 LAB — URINE MICROSCOPIC-ADD ON

## 2013-11-13 MED ORDER — HYDROCODONE-ACETAMINOPHEN 5-325 MG PO TABS: 1.0000 | ORAL_TABLET | Freq: Four times a day (QID) | ORAL | Status: DC | PRN

## 2013-11-13 MED ORDER — SODIUM CHLORIDE 0.9 % IV SOLN
Freq: Once | INTRAVENOUS | Status: AC
  Administered 2013-11-13: 02:00:00 via INTRAVENOUS

## 2013-11-13 MED ORDER — HYDROMORPHONE HCL PF 1 MG/ML IJ SOLN
1.0000 mg | Freq: Once | INTRAMUSCULAR | Status: AC
  Administered 2013-11-13: 1 mg via INTRAVENOUS
  Filled 2013-11-13: qty 1

## 2013-11-13 MED ORDER — ONDANSETRON HCL 4 MG/2ML IJ SOLN
4.0000 mg | Freq: Once | INTRAMUSCULAR | Status: AC
  Administered 2013-11-13: 4 mg via INTRAVENOUS
  Filled 2013-11-13: qty 2

## 2013-11-13 NOTE — ED Notes (Signed)
Pt. reports RLQ and right low back pain with nausea onset last night .

## 2013-11-13 NOTE — ED Provider Notes (Signed)
CSN: [id number]     Arrival date & time 11/13/13  0043 History   First MD Initiated Contact with Patient 11/13/13 0124     Chief Complaint  Patient presents with  . Abdominal Pain  . Back Pain     (Consider location/radiation/quality/duration/timing/severity/associated sxs/prior Treatment) HPI Patient presents with new right flank and abdominal pain. Pain began approximately 6 hours ago, without clear precipitant.  Since onset patient has been nauseous, with persistent sharp pain from the right flank radiating towards the suprapubic area. There is no fever, chills, vomiting, diarrhea. Patient had no relief with enema. Patient denies other ongoing medical concerns. Patient states that she is compliant with all medications, including her anticoagulants.  Past Medical History  Diagnosis Date  . Breast cancer 2000    Rt lumpectomy, Chemo/XRT/rt axillary node   . Depression   . Low back pain   . Arthritis   . Obesity   . Diverticulosis   . Hx of adenomatous colonic polyps   . GERD (gastroesophageal reflux disease)    Past Surgical History  Procedure Laterality Date  . Tubal ligation    . Breast lumpectomy      for CA txed w/ chemo and radiation, right  . Umbilical hernia repair  1983   Family History  Problem Relation Age of Onset  . Asthma Mother   . COPD Mother   . Lung cancer Father   . Asthma Daughter   . Kidney failure Brother   . Diabetes Brother     x 2  . Heart attack Brother   . Heart attack Mother   . Breast cancer Maternal Aunt   . Colon cancer Paternal Aunt   . Diabetes Daughter     x 2  . Diabetes Mother   . Hypertension Brother    History  Substance Use Topics  . Smoking status: Never Smoker   . Smokeless tobacco: Never Used  . Alcohol Use: No   OB History   Grav Para Term Preterm Abortions TAB SAB Ect Mult Living                 Review of Systems  Constitutional:       Per HPI, otherwise negative  HENT:       Per HPI, otherwise negative   Respiratory:       Per HPI, otherwise negative  Cardiovascular:       Per HPI, otherwise negative  Gastrointestinal: Negative for vomiting.  Endocrine:       Negative aside from HPI  Genitourinary:       Neg aside from HPI   Musculoskeletal:       Per HPI, otherwise negative  Skin: Negative.   Neurological: Negative for syncope.      Allergies  Erythromycin; Morphine; Penicillins; and Relafen  Home Medications   Prior to Admission medications   Medication Sig Start Date End Date Taking? Authorizing Provider  dexlansoprazole (DEXILANT) 60 MG capsule Take 60 mg by mouth daily.   Yes Historical Provider, MD  Rivaroxaban (XARELTO) 15 MG TABS tablet Take 15 mg by mouth daily with supper.   Yes Historical Provider, MD   BP 117/68  Pulse 55  Temp(Src) 97.9 F (36.6 C) (Oral)  Resp 20  SpO2 97% Physical Exam  Nursing note and vitals reviewed. Constitutional: She is oriented to person, place, and time. She appears well-developed and well-nourished. No distress.  HENT:  Head: Normocephalic and atraumatic.  Eyes: Conjunctivae and EOM are normal.  Cardiovascular: Normal rate and regular rhythm.   Pulmonary/Chest: Effort normal and breath sounds normal. No stridor. No respiratory distress.  Abdominal: She exhibits no distension.  Abdomen is soft, nontender, no peritoneal, with no hepato-splenomegaly, or other discernible lesion  Musculoskeletal: She exhibits no edema.  Neurological: She is alert and oriented to person, place, and time. No cranial nerve deficit.  Skin: Skin is warm and dry.  Psychiatric: She has a normal mood and affect.    ED Course  Procedures (including critical care time) Labs Review Labs Reviewed  COMPREHENSIVE METABOLIC PANEL - Abnormal; Notable for the following:    Glucose, Bld 139 (*)    Creatinine, Ser 1.49 (*)    GFR calc non Af Amer 36 (*)    GFR calc Af Amer 42 (*)    All other components within normal limits  URINALYSIS, ROUTINE W REFLEX  MICROSCOPIC - Abnormal; Notable for the following:    Hgb urine dipstick LARGE (*)    All other components within normal limits  URINE MICROSCOPIC-ADD ON - Abnormal; Notable for the following:    Squamous Epithelial / LPF FEW (*)    All other components within normal limits  CBC WITH DIFFERENTIAL    Imaging Review Ct Abdomen Pelvis Wo Contrast  11/13/2013   CLINICAL DATA:  Right lower quadrant and right low back pain with nausea. Onset last night.  EXAM: CT ABDOMEN AND PELVIS WITHOUT CONTRAST  TECHNIQUE: Multidetector CT imaging of the abdomen and pelvis was performed following the standard protocol without IV contrast.  COMPARISON:  12/04/2006  FINDINGS: Lung bases are clear.  Small esophageal hiatal hernia.  Multiple circumscribed low-attenuation lesions are demonstrated in the liver, largest measuring about 12 mm diameter. These appear to have been present on the previous study from 2008, suggesting a likely benign etiology. Diffuse fatty infiltration of the pancreas. The unenhanced appearance of the gallbladder, spleen, adrenal glands, abdominal aorta, inferior vena cava, and retroperitoneal lymph nodes is unremarkable. Renal collecting systems are prominent bilaterally, greater on the left. On the right, there is associated ureterectasis with a punctate size stone demonstrated in the distal right ureter at the ureterovesical junction, likely causing obstruction. The left ureter is decompressed and no stones are demonstrated. This may represent ureteropelvic junction stenosis. Stomach and small bowel are not abnormally distended. Scattered diverticula in the colon. Stool-filled colon. No distention or wall thickening. No free air or free fluid in the abdomen.  Pelvis: Uterus and ovaries are not enlarged. Bladder wall is not thickened. Diverticula of sigmoid colon without evidence of diverticulitis. Appendix is not identified but no inflammatory changes are present in the right lower quadrant. No free  or loculated pelvic fluid collections. Degenerative changes in the spine. No destructive bone lesions.  IMPRESSION: Punctate stone distal right ureter with moderate proximal obstruction. Left hydronephrosis without hydroureter or stone. Small hiatal hernia.   Electronically Signed   By: Lucienne Capers M.D.   On: 11/13/2013 04:25      MDM  Patient presents with new right flank, right lower abdominal pain.  Given the persistency of pain, patient had CT scan performed.  This demonstrated a right-sided kidney stone. Patient has no evidence of obstruction, nor infection.  Patient was discharged in stable condition after pain is well-controlled.   Carmin Muskrat, MD 11/13/13 [phone]

## 2013-11-13 NOTE — ED Notes (Signed)
Patient transported to CT 

## 2013-11-13 NOTE — Discharge Instructions (Signed)
As discussed, your evaluation tonight has demonstrated the presence of kidney stones  It is important that you follow-up with your physician, as well as urology to insure appropriate resolution.  Please take all medication as directed, and do not hesitate to return here if you develop new, or concerning changes in your condition.   Kidney Stones Kidney stones (urolithiasis) are solid masses that form inside your kidneys. The intense pain is caused by the stone moving through the kidney, ureter, bladder, and urethra (urinary tract). When the stone moves, the ureter starts to spasm around the stone. The stone is usually passed in your pee (urine).  HOME CARE  Drink enough fluids to keep your pee clear or pale yellow. This helps to get the stone out.  Strain all pee through the provided strainer. Do not pee without peeing through the strainer, not even once. If you pee the stone out, catch it in the strainer. The stone may be as small as a grain of salt. Take this to your doctor. This will help your doctor figure out what you can do to try to prevent more kidney stones.  Only take medicine as told by your doctor.  Follow up with your doctor as told.  Get follow-up X-rays as told by your doctor. GET HELP IF: You have pain that gets worse even if you have been taking pain medicine. GET HELP RIGHT AWAY IF:   Your pain does not get better with medicine.  You have a fever or shaking chills.  Your pain increases and gets worse over 18 hours.  You have new belly (abdominal) pain.  You feel faint or pass out.  You are unable to pee. MAKE SURE YOU:   Understand these instructions.  Will watch your condition.  Will get help right away if you are not doing well or get worse. Document Released: 08/20/2007 Document Revised: 11/03/2012 Document Reviewed: 08/04/2012 [facility] Patient Information 2015 [place], [place]. This information is not intended to replace advice given to you by your  health care provider. Make sure you discuss any questions you have with your health care provider.

## 2013-11-13 NOTE — ED Notes (Signed)
Patient returned from CT

## 2014-01-31 ENCOUNTER — Other Ambulatory Visit: Payer: Self-pay

## 2014-01-31 DIAGNOSIS — Z1231 Encounter for screening mammogram for malignant neoplasm of breast: Secondary | ICD-10-CM

## 2014-02-08 ENCOUNTER — Ambulatory Visit
Admission: RE | Admit: 2014-02-08 | Discharge: 2014-02-08 | Disposition: A | Discharge status: Home / Self Care | Payer: 59 | Source: Ambulatory Visit

## 2014-02-08 DIAGNOSIS — Z1231 Encounter for screening mammogram for malignant neoplasm of breast: Secondary | ICD-10-CM

## 2014-04-27 ENCOUNTER — Encounter: Payer: Self-pay | Admitting: Internal Medicine

## 2014-04-27 ENCOUNTER — Ambulatory Visit (INDEPENDENT_AMBULATORY_CARE_PROVIDER_SITE_OTHER): Payer: Medicaid Other | Admitting: Internal Medicine

## 2014-04-27 VITALS — BP 132/74 | HR 62 | Temp 99.3°F | Ht 65.5 in | Wt 247.4 lb

## 2014-04-27 DIAGNOSIS — I2699 Other pulmonary embolism without acute cor pulmonale: Secondary | ICD-10-CM

## 2014-04-27 DIAGNOSIS — J440 Chronic obstructive pulmonary disease with acute lower respiratory infection: Secondary | ICD-10-CM

## 2014-04-27 MED ORDER — AZITHROMYCIN 250 MG PO TABS: ORAL_TABLET | ORAL | Status: DC

## 2014-04-27 NOTE — Progress Notes (Signed)
Patient ID: Diane Barker, female    DOB: [birth year], 64 y.o.   MRN: [id number]  HPI 08/02/10- 47 yoF never smoker followed for bronchitis with hx rhinitis, GERD, hx breast cancer. 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 Woke 2 weeks ago with persistent cough, fever, chilling. Still wakes after night sweat. We got her a cough syrup when her cough began to hurt her back. 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 She describes this as a cold. Dr Virgina Jock gave bactrim at outset- no help. 2days ago she called [place] for Z pak and cough syrup- day 3. Now slowly better.   08/22/10- bronchitis, hx rhinitis, GERD hx breast Ca Cough got better, but for 4 days has had pain over right eye, increased cough, clear mucus. Denies fever, sore throat. 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   02/05/11-  [name] never smoker followed for bronchitis with hx rhinitis, GERD, hx breast cancer. Declines flu vaccine. Advair sample did help but she didn't refill it. In the last several days she has had increased cough and drainage, sneezing, possibly an early cold. Had some chills this morning but no fever and nothing purulent.  02/28/11-  [name] never smoker followed for bronchitis with hx rhinitis, GERD, hx breast cancer. Has declined flu shot. Now presents with acute illness including sore throat global headache, constant hard cough until she retches and can't sleep. Denies fever, nausea, myalgias.  08/08/11- [name] never smoker followed for bronchitis with hx rhinitis, GERD, hx breast cancer.  PCP Dr Oran Rein has been helpful for controlling her acid reflux symptoms. She failed Prilosec. Cough has also gotten much better and began taking Dexilant. Denies problems from spring pollen.  History of right lumpectomy with chemotherapy and radiation therapy. CT  01/22/09- reviewed with her.   IMPRESION:  1. No acute abnormality.  2. Right aortic arch unchanged, as  described above.  Provider: Sherle Poe  CXR TODAY 06/08/11- reviewed with her: Findings: The patient is rotated to the right. Abnormal  mediastinal contour again noted, consistent with the known right-  sided aortic arch. Cardiopericardial silhouette is at upper limits  of normal for size. There is mild vascular congestion without  overt airspace pulmonary edema. Bones are diffusely demineralized.  IMPRESSION:  Stable exam when taking into account the rotation. No new or acute  interval findings.  Original Report Authenticated By: ERIC A. MANSELL, M.D.   09/01/11-  [name] never smoker followed for bronchitis with hx rhinitis, GERD, hx breast cancer.  PCP Dr Virgina Jock ACUTE VISIT: cough-dry and hacky, wheezing, SOB, concerned if fluid is off lungs as well; not sleeping due to cough-rough past 3 days. Persistent hacking cough started about 7 days after last here. She is aware of reflux despite her Dexilant. Coughs badly when eating. Coughs until she retches white mucus. Ankle edema resolved. She sleeps on 3 pillows because of her coughing. Dr. Virgina Jock as already told her she needs followup with Dr Olevia Perches GI. Denies sinus drip or wheeze but does occasionally sneeze. CXR 06/08/11- mild vascular congestion.  01/16/2012 Acute OV  Complains of 4 weeks of cough, congestion, sinus drainage, wheezing Seen by primary care Dr. 3-4 weeks ago. Given antibiotic, unknown which one. Cough never got better Using Phenergan with codeine cough syrup without much relief. She denies any diarrhea, hemoptysis, orthopnea, PND, or leg swelling Chest x-ray June 2013 without any acute process noted  06/20/13- 62  yoF never smoker followed for bronchitis with hx rhinitis, GERD, hx breast cancer.  PCP Dr Virgina Jock FOLLOWS FOR: constant cough-not productive enough to bring up-has been vomiting;chills since Thursday-denies any fevers; SOB when walking short distances, pain in right calf-has varicose veins as well. Has been going  on since Feb 26th 2015. Wheezing at times.  Dr Virgina Jock did CXR "fine". She has taken 3 rounds prednisone, claritin, mucinex. Gradually better but still coughs until she retches.  04/27/14- 63 yoF never smoker followed for bronchitis with hx rhinitis, GERD, hx breast cancer, Hx DVT/ PE/ Xarelto.  PCP Dr Virgina Jock ACUTE VISIT: Pt having deep cough(dry), SOB at time with chest congestion, chills, diarrhea, vomiting night before. Nasal drainage-yellow.  Review of Systems-See HPI Constitutional:   No-   weight loss, night sweats, fevers, chills, fatigue, lassitude. HEENT:   +  headaches, No-difficulty swallowing, tooth/dental problems, +sore throat,       +  sneezing, itching, ear ache, nasal congestion, +post nasal drip,  CV:  No-   chest pain, orthopnea, PND, swelling in lower extremities, anasarca,  dizziness, palpitations Resp: +shortness of breath with exertion or at rest.              +  productive cough,  + non-productive cough,  No- coughing up of blood.              No-   change in color of mucus.  No- wheezing.   Skin: No-   rash or lesions. GI:  +  heartburn, indigestion, abdominal pain, +nausea, +vomiting,  GU: MS:  No-   joint pain or swelling.   Neuro-     nothing unusual Psych:  No- change in mood or affect. No depression or anxiety.  No memory loss.  Objective:  OBJ- Physical Exam General- Alert, Oriented, Affect-appropriate, Distress- none acute Skin- + dry skin with small excoriated scabs on pretibial areas bilaterally Lymphadenopathy- none Head- atraumatic            Eyes- Gross vision intact, PERRLA, conjunctivae and secretions clear            Ears- Hearing, canals-normal            Nose- + sniffing, no-Septal dev, mucus, polyps, erosion, perforation             Throat- Mallampati II , mucosa clear , drainage- none, tonsils- atrophic Neck- flexible , trachea midline, no stridor , thyroid nl, carotid no bruit Chest - symmetrical excursion , unlabored           Heart/CV- RRR ,  no murmur , no gallop  , no rub, nl s1 s2                           - JVD- none , edema- none, stasis changes- none, varices- none           Lung- + light bibasilar crackle, wheeze- none, cough-none , dullness-none, rub- none, rales-none           Chest wall-  Abd-  Br/ Gen/ Rectal- Not done, not indicated Extrem-  Neuro- grossly intact to observation

## 2014-04-27 NOTE — Patient Instructions (Addendum)
Script sent for Z pak  Try otc Delsym or Robitussin CF  cough syrup  Try using skin moisturizer lotion on your legs so they don't itch and you don't scratch. Neosporin or Polysporin is ok on the sores.  Ok to cancel the April appointment and reschedule for a year

## 2014-04-29 NOTE — Assessment & Plan Note (Signed)
Now on long-term Xarelto

## 2014-04-29 NOTE — Assessment & Plan Note (Signed)
Acute exacerbation of bronchitis. GI component has resolved. Plan-Z-Pak, fluids, cough syrup if needed

## 2014-05-03 ENCOUNTER — Telehealth: Payer: Self-pay | Admitting: Internal Medicine

## 2014-05-03 MED ORDER — CEFDINIR 300 MG PO CAPS: 300.0000 mg | ORAL_CAPSULE | Freq: Two times a day (BID) | ORAL | Status: DC

## 2014-05-03 MED ORDER — PREDNISONE 10 MG PO TABS: ORAL_TABLET | ORAL | Status: DC

## 2014-05-03 NOTE — Telephone Encounter (Signed)
Spoke with pt, states she is still feeling bad after 2/11 appt.  Pt c/o nonprod cough but feels congested, no fever, no sinus congestion.  Pt wants to know if there are any further recs.  Uses [facility] on [address].   Last ov: 2/11 Next ov: 04/30/15  CY please advise.  Thank you.  Allergies  Allergen Reactions  . Erythromycin     REACTION: stomach cramps  . Morphine     REACTION: Hallucinations  . Penicillins     REACTION: unkkown  . Relafen [Nabumetone] Nausea Only   Current Outpatient Prescriptions on File Prior to Visit  Medication Sig Dispense Refill  . azithromycin (ZITHROMAX) 250 MG tablet 2 today then one daily 6 tablet 0  . Cholecalciferol (VITAMIN D) 2000 UNITS CAPS Take 1 capsule by mouth daily.    Marland Kitchen dexlansoprazole (DEXILANT) 60 MG capsule Take 60 mg by mouth daily.    . fluticasone (FLONASE) 50 MCG/ACT nasal spray Place 2 sprays into both nostrils daily.    . ranitidine (ZANTAC) 150 MG tablet Take 150 mg by mouth at bedtime.    Alveda Reasons 20 MG TABS tablet Take 1 tablet by mouth daily.  5   No current facility-administered medications on file prior to visit.

## 2014-05-03 NOTE — Telephone Encounter (Signed)
Per Dr. Annamaria Boots, sent in Prednisone taper 10mg  x 8days and Cefdinir 300mg  BID #14.  Rx sent and patient notified.  Nothing further needed.

## 2014-05-09 ENCOUNTER — Ambulatory Visit (INDEPENDENT_AMBULATORY_CARE_PROVIDER_SITE_OTHER): Payer: Medicaid Other | Admitting: Internal Medicine

## 2014-05-09 ENCOUNTER — Ambulatory Visit (INDEPENDENT_AMBULATORY_CARE_PROVIDER_SITE_OTHER)
Admission: RE | Admit: 2014-05-09 | Discharge: 2014-05-09 | Disposition: A | Discharge status: Home / Self Care | Payer: Medicaid Other | Source: Ambulatory Visit | Attending: Internal Medicine | Admitting: Internal Medicine

## 2014-05-09 ENCOUNTER — Other Ambulatory Visit: Payer: Medicaid Other

## 2014-05-09 ENCOUNTER — Encounter: Payer: Self-pay | Admitting: Internal Medicine

## 2014-05-09 VITALS — BP 136/84 | HR 61 | Ht 65.5 in | Wt 256.8 lb

## 2014-05-09 DIAGNOSIS — J440 Chronic obstructive pulmonary disease with acute lower respiratory infection: Secondary | ICD-10-CM

## 2014-05-09 DIAGNOSIS — J209 Acute bronchitis, unspecified: Secondary | ICD-10-CM

## 2014-05-09 DIAGNOSIS — I82403 Acute embolism and thrombosis of unspecified deep veins of lower extremity, bilateral: Secondary | ICD-10-CM

## 2014-05-09 DIAGNOSIS — I82402 Acute embolism and thrombosis of unspecified deep veins of left lower extremity: Secondary | ICD-10-CM

## 2014-05-09 DIAGNOSIS — I82409 Acute embolism and thrombosis of unspecified deep veins of unspecified lower extremity: Secondary | ICD-10-CM | POA: Insufficient documentation

## 2014-05-09 MED ORDER — UMECLIDINIUM BROMIDE 62.5 MCG/INH IN AEPB: 1.0000 | INHALATION_SPRAY | Freq: Every day | RESPIRATORY_TRACT | Status: DC

## 2014-05-09 NOTE — Patient Instructions (Addendum)
Order- lab- D dimer     Dx DVT  Sample Incruse inhaler   1 puff, once daily  You can add this to your regular meds. See if it helps you breathe more easily  Order- CXR   Dx Acute bronchitis

## 2014-05-09 NOTE — Assessment & Plan Note (Addendum)
Slowly resolving post viral pattern bronchitis. This should gradually clear as she finishes the last of her antibiotic and prednisone. Plan-try sample Incruse inhaler, chest x-ray

## 2014-05-09 NOTE — Progress Notes (Signed)
Patient ID: Diane Barker, female    DOB: 10-Oct-1950, 64 y.o.   MRN: [id number]  HPI 08/02/10- 42 yoF never smoker followed for bronchitis with hx rhinitis, GERD, hx breast cancer. 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 Woke 2 weeks ago with persistent cough, fever, chilling. Still wakes after night sweat. We got her a cough syrup when her cough began to hurt her back. 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 She describes this as a cold. Dr Virgina Jock gave bactrim at outset- no help. 2days ago she called [place] for Z pak and cough syrup- day 3. Now slowly better.   08/22/10- bronchitis, hx rhinitis, GERD hx breast Ca Cough got better, but for 4 days has had pain over right eye, increased cough, clear mucus. Denies fever, sore throat. 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   02/05/11-  [name] never smoker followed for bronchitis with hx rhinitis, GERD, hx breast cancer. Declines flu vaccine. Advair sample did help but she didn't refill it. In the last several days she has had increased cough and drainage, sneezing, possibly an early cold. Had some chills this morning but no fever and nothing purulent.  02/28/11-  22 yoF never smoker followed for bronchitis with hx rhinitis, GERD, hx breast cancer. Has declined flu shot. Now presents with acute illness including sore throat global headache, constant hard cough until she retches and can't sleep. Denies fever, nausea, myalgias.  08/08/11- 89 yoF never smoker followed for bronchitis with hx rhinitis, GERD, hx breast cancer.  PCP Dr Oran Rein has been helpful for controlling her acid reflux symptoms. She failed Prilosec. Cough has also gotten much better and began taking Dexilant. Denies problems from spring pollen.  History of right lumpectomy with chemotherapy and radiation therapy. CT  01/22/09- reviewed with her.   IMPRESION:  1. No acute abnormality.  2. Right aortic arch unchanged, as  described above.  Provider: Sherle Poe  CXR TODAY 06/08/11- reviewed with her: Findings: The patient is rotated to the right. Abnormal  mediastinal contour again noted, consistent with the known right-  sided aortic arch. Cardiopericardial silhouette is at upper limits  of normal for size. There is mild vascular congestion without  overt airspace pulmonary edema. Bones are diffusely demineralized.  IMPRESSION:  Stable exam when taking into account the rotation. No new or acute  interval findings.  Original Report Authenticated By: ERIC A. MANSELL, M.D.   09/01/11-  61 yoF never smoker followed for bronchitis with hx rhinitis, GERD, hx breast cancer.  PCP Dr Virgina Jock ACUTE VISIT: cough-dry and hacky, wheezing, SOB, concerned if fluid is off lungs as well; not sleeping due to cough-rough past 3 days. Persistent hacking cough started about 7 days after last here. She is aware of reflux despite her Dexilant. Coughs badly when eating. Coughs until she retches white mucus. Ankle edema resolved. She sleeps on 3 pillows because of her coughing. Dr. Virgina Jock as already told her she needs followup with Dr Olevia Perches GI. Denies sinus drip or wheeze but does occasionally sneeze. CXR 06/08/11- mild vascular congestion.  01/16/2012 Acute OV  Complains of 4 weeks of cough, congestion, sinus drainage, wheezing Seen by primary care Dr. 3-4 weeks ago. Given antibiotic, unknown which one. Cough never got better Using Phenergan with codeine cough syrup without much relief. She denies any diarrhea, hemoptysis, orthopnea, PND, or leg swelling Chest x-ray June 2013 without any acute process noted  06/20/13- 62  yoF never smoker followed for bronchitis with hx rhinitis, GERD, hx breast cancer.  PCP Dr Virgina Jock FOLLOWS FOR: constant cough-not productive enough to bring up-has been vomiting;chills since Thursday-denies any fevers; SOB when walking short distances, pain in right calf-has varicose veins as well. Has been going  on since Feb 26th 2015. Wheezing at times.  Dr Virgina Jock did CXR "fine". She has taken 3 rounds prednisone, claritin, mucinex. Gradually better but still coughs until she retches.  04/27/14- 63 yoF never smoker followed for bronchitis with hx rhinitis, GERD, hx breast cancer, Hx DVT/ PE/ Xarelto.  PCP Dr Virgina Jock ACUTE VISIT: Pt having deep cough(dry), SOB at time with chest congestion, chills, diarrhea, vomiting night before. Nasal drainage-yellow.  05/09/14- 63 yoF never smoker followed for bronchitis with hx rhinitis, GERD, hx breast cancer, Hx DVT/ PE/ Xarelto.  PCP Dr Virgina Jock FOLLOWS FOR: Pt having cough and sob and wheezing. She completed zpac but still on Prednisone and Omnicef. She is concerned that she continues to cough-now scant clear sputum. Finishing Omnicef and prednisone. Remains on Xarelto managed by her primary physician but she is concerned that a persistent sense of dyspnea and chest heaviness might mean she has had another blood clot. Denies pleuritic pain, hemoptysis, leg pain or swelling.  Review of Systems-See HPI Constitutional:   No-   weight loss, night sweats, fevers, chills, fatigue, lassitude. HEENT:   +  headaches, No-difficulty swallowing, tooth/dental problems, +sore throat,       +  sneezing, itching, ear ache, nasal congestion, +post nasal drip,  CV:  No-   chest pain, orthopnea, PND, swelling in lower extremities, anasarca,  dizziness, palpitations Resp: +shortness of breath with exertion or at rest.              +  productive cough,  + non-productive cough,  No- coughing up of blood.              No-   change in color of mucus.  No- wheezing.   Skin: No-   rash or lesions. GI:  +  heartburn, indigestion, abdominal pain, +nausea, vomiting,  GU: MS:  No-   joint pain or swelling.   Neuro-     nothing unusual Psych:  No- change in mood or affect. No depression or anxiety.  No memory loss.  Objective:  OBJ- Physical Exam General- Alert, Oriented, Affect-appropriate,  Distress- none acute Skin- rash-none, lesions- none, excoriation- none Lymphadenopathy- none Head- atraumatic            Eyes- Gross vision intact, PERRLA, conjunctivae and secretions clear            Ears- Hearing, canals-normal            Nose- Clear, no-Septal dev, mucus, polyps, erosion, perforation             Throat- Mallampati II , mucosa clear , drainage- none, tonsils- atrophic Neck- flexible , trachea midline, no stridor , thyroid nl, carotid no bruit Chest - symmetrical excursion , unlabored           Heart/CV- RRR , no murmur , no gallop  , no rub, nl s1 s2                           - JVD- none , edema- none, stasis changes- none, varices- none           Lung- clear to P&A, unlabored, wheeze- none, cough-+ mild , dullness-none,  rub- none           Chest wall-  Abd-  Br/ Gen/ Rectal- Not done, not indicated Extrem- cyanosis- none, clubbing, none, atrophy- none, strength- nl Neuro- grossly intact to observation

## 2014-05-09 NOTE — Assessment & Plan Note (Addendum)
She is concerned about persistent dyspnea. She remains on Xarelto and I think risk of new DVT is very low. Plan- D-dimer

## 2014-05-10 ENCOUNTER — Telehealth: Payer: Self-pay | Admitting: Internal Medicine

## 2014-05-10 LAB — D-DIMER, QUANTITATIVE: D-Dimer, Quant: 0.27 ug/mL-FEU (ref 0.00–0.48)

## 2014-05-10 NOTE — Telephone Encounter (Signed)
Called and spoke with pt and she stated that she used the incruse yesterday and her tongue was red and felt like it was on fire.  She stated that her tongue is still some red today.  Wanting to know if anything else she can do?  Change the inhaler?  CY please advise. Thanks  Allergies  Allergen Reactions  . Erythromycin     REACTION: stomach cramps  . Morphine     REACTION: Hallucinations  . Penicillins     REACTION: unkkown  . Relafen [Nabumetone] Nausea Only    Current Outpatient Prescriptions on File Prior to Visit  Medication Sig Dispense Refill  . cefdinir (OMNICEF) 300 MG capsule Take 1 capsule (300 mg total) by mouth 2 (two) times daily. 14 capsule 0  . Cholecalciferol (VITAMIN D) 2000 UNITS CAPS Take 1 capsule by mouth daily.    Marland Kitchen dexlansoprazole (DEXILANT) 60 MG capsule Take 60 mg by mouth daily.    . fluticasone (FLONASE) 50 MCG/ACT nasal spray Place 2 sprays into both nostrils daily.    . predniSONE (DELTASONE) 10 MG tablet Take 4 tablets x 2 days, then 3 x 2 days, then 2 x 2 days, then 1 x 2 days then STOP 20 tablet 0  . ranitidine (ZANTAC) 150 MG tablet Take 150 mg by mouth at bedtime.    Marland Kitchen Umeclidinium Bromide (INCRUSE ELLIPTA) 62.5 MCG/INH AEPB Inhale 1 puff into the lungs daily. 1 each 0  . XARELTO 20 MG TABS tablet Take 1 tablet by mouth daily.  5   No current facility-administered medications on file prior to visit.

## 2014-05-11 NOTE — Telephone Encounter (Signed)
lmtcb x1 

## 2014-05-11 NOTE — Telephone Encounter (Signed)
Pt returning call.Diane Barker ° °

## 2014-05-11 NOTE — Telephone Encounter (Signed)
Pt came by the office and has been shown how to use Spiriva Handihaler. She will report back to [place] how she is doing next week. Nothing more needed at this time.

## 2014-05-11 NOTE — Telephone Encounter (Signed)
Sample of Spiriva Handihaler will be left at the front desk. Nothing further was needed.

## 2014-05-11 NOTE — Telephone Encounter (Signed)
Stop the Incruse. Try sample of Spiriva- either Respimat 2 puffs, once daily, or Handihaler 1 daily.

## 2014-06-07 ENCOUNTER — Other Ambulatory Visit: Payer: Self-pay | Admitting: Internal Medicine

## 2014-06-07 ENCOUNTER — Ambulatory Visit (INDEPENDENT_AMBULATORY_CARE_PROVIDER_SITE_OTHER)
Admission: RE | Admit: 2014-06-07 | Discharge: 2014-06-07 | Disposition: A | Discharge status: Home / Self Care | Payer: Medicaid Other | Source: Ambulatory Visit | Attending: Internal Medicine | Admitting: Internal Medicine

## 2014-06-07 DIAGNOSIS — J209 Acute bronchitis, unspecified: Secondary | ICD-10-CM | POA: Diagnosis not present

## 2014-06-08 ENCOUNTER — Telehealth: Payer: Self-pay | Admitting: Internal Medicine

## 2014-06-08 MED ORDER — PROMETHAZINE-CODEINE 6.25-10 MG/5ML PO SYRP: 5.0000 mL | ORAL_SOLUTION | Freq: Four times a day (QID) | ORAL | Status: DC | PRN

## 2014-06-08 MED ORDER — AZITHROMYCIN 250 MG PO TABS: ORAL_TABLET | ORAL | Status: AC

## 2014-06-08 NOTE — Telephone Encounter (Signed)
Notes Recorded by Deneise Lever, MD on 06/07/2014 at 11:52 AM CXR- the lungs are clear with no pneumonia, so this is a bronchitis. Offer Z pak and prometh codeine cough syup 140 ml, 5 ml every 6 hours if needed for cough --- Pt is aware of her results. Both prescriptions have been sent/called into her pharmacy. Nothing further was needed.

## 2014-06-21 ENCOUNTER — Ambulatory Visit: Payer: 59 | Admitting: Internal Medicine

## 2014-08-04 ENCOUNTER — Ambulatory Visit: Payer: Medicaid Other | Admitting: Internal Medicine

## 2014-10-31 DIAGNOSIS — L900 Lichen sclerosus et atrophicus: Secondary | ICD-10-CM | POA: Diagnosis not present

## 2014-10-31 DIAGNOSIS — B373 Candidiasis of vulva and vagina: Secondary | ICD-10-CM | POA: Diagnosis not present

## 2014-10-31 DIAGNOSIS — N9089 Other specified noninflammatory disorders of vulva and perineum: Secondary | ICD-10-CM | POA: Diagnosis not present

## 2014-10-31 DIAGNOSIS — R350 Frequency of micturition: Secondary | ICD-10-CM | POA: Diagnosis not present

## 2014-11-17 DIAGNOSIS — E785 Hyperlipidemia, unspecified: Secondary | ICD-10-CM | POA: Diagnosis not present

## 2014-11-17 DIAGNOSIS — F325 Major depressive disorder, single episode, in full remission: Secondary | ICD-10-CM | POA: Diagnosis not present

## 2014-11-17 DIAGNOSIS — Z6841 Body Mass Index (BMI) 40.0 and over, adult: Secondary | ICD-10-CM | POA: Diagnosis not present

## 2014-11-17 DIAGNOSIS — D692 Other nonthrombocytopenic purpura: Secondary | ICD-10-CM | POA: Diagnosis not present

## 2014-11-17 DIAGNOSIS — Z7901 Long term (current) use of anticoagulants: Secondary | ICD-10-CM | POA: Diagnosis not present

## 2014-12-09 DIAGNOSIS — Z23 Encounter for immunization: Secondary | ICD-10-CM | POA: Diagnosis not present

## 2015-02-06 ENCOUNTER — Ambulatory Visit (INDEPENDENT_AMBULATORY_CARE_PROVIDER_SITE_OTHER): Payer: Medicare Other | Admitting: Internal Medicine

## 2015-02-06 ENCOUNTER — Encounter: Payer: Self-pay | Admitting: Internal Medicine

## 2015-02-06 VITALS — BP 118/72 | HR 62 | Ht 65.5 in | Wt 260.8 lb

## 2015-02-06 DIAGNOSIS — I2699 Other pulmonary embolism without acute cor pulmonale: Secondary | ICD-10-CM

## 2015-02-06 DIAGNOSIS — R05 Cough: Secondary | ICD-10-CM

## 2015-02-06 DIAGNOSIS — R059 Cough, unspecified: Secondary | ICD-10-CM

## 2015-02-06 DIAGNOSIS — J310 Chronic rhinitis: Secondary | ICD-10-CM | POA: Diagnosis not present

## 2015-02-06 DIAGNOSIS — J440 Chronic obstructive pulmonary disease with acute lower respiratory infection: Secondary | ICD-10-CM | POA: Diagnosis not present

## 2015-02-06 MED ORDER — METHYLPREDNISOLONE ACETATE 80 MG/ML IJ SUSP
80.0000 mg | Freq: Once | INTRAMUSCULAR | Status: AC
  Administered 2015-02-06: 80 mg via INTRAMUSCULAR

## 2015-02-06 NOTE — Assessment & Plan Note (Signed)
Remains compliant with Xarelto. No acute leg symptoms. I do not believe today's visit reflects recurrent venous thromboembolic disease.

## 2015-02-06 NOTE — Progress Notes (Signed)
Patient ID: Diane Barker, female    DOB: [birth year], 64 y.o.   MRN: [id number]  HPI Diane Barker- 47 yoF never smoker followed for bronchitis with hx rhinitis, GERD, hx breast cancer. 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 Woke 2 weeks ago with persistent cough, fever, chilling. Still wakes after night sweat. We got her a cough syrup when her cough began to hurt her back. 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 She describes this as a cold. Dr Virgina Jock gave bactrim at outset- no help. 2days ago she called [place] for Z pak and cough syrup- day 3. Now slowly better.   Diane Barker- bronchitis, hx rhinitis, GERD hx breast Ca Cough got better, but for 4 days has had pain over right eye, increased cough, clear mucus. Denies fever, sore throat. 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   Diane Barker-  [name] never smoker followed for bronchitis with hx rhinitis, GERD, hx breast cancer. Declines flu vaccine. Advair sample did help but she didn't refill it. In the last several days she has had increased cough and drainage, sneezing, possibly an early cold. Had some chills this morning but no fever and nothing purulent.  Diane Barker-  [name] never smoker followed for bronchitis with hx rhinitis, GERD, hx breast cancer. Has declined flu shot. Now presents with acute illness including sore throat global headache, constant hard cough until she retches and can't sleep. Denies fever, nausea, myalgias.  Diane Barker- [name] never smoker followed for bronchitis with hx rhinitis, GERD, hx breast cancer.  PCP Dr Oran Rein has been helpful for controlling her acid reflux symptoms. She failed Prilosec. Cough has also gotten much better and began taking Dexilant. Denies problems from spring pollen.  History of right lumpectomy with chemotherapy and radiation therapy. CT  Diane Barker- reviewed with her.   IMPRESION:  1. No acute abnormality.  2. Right aortic arch unchanged, as  described above.  Provider: Sherle Poe  CXR TODAY 06/08/11- reviewed with her: Findings: The patient is rotated to the right. Abnormal  mediastinal contour again noted, consistent with the known right-  sided aortic arch. Cardiopericardial silhouette is at upper limits  of normal for size. There is mild vascular congestion without  overt airspace pulmonary edema. Bones are diffusely demineralized.  IMPRESSION:  Stable exam when taking into account the rotation. No new or acute  interval findings.  Original Report Authenticated By: ERIC A. MANSELL, M.D.   Diane Barker-  [name] never smoker followed for bronchitis with hx rhinitis, GERD, hx breast cancer.  PCP Dr Virgina Jock ACUTE VISIT: cough-dry and hacky, wheezing, SOB, concerned if fluid is off lungs as well; not sleeping due to cough-rough past 3 days. Persistent hacking cough started about 7 days after last here. She is aware of reflux despite her Dexilant. Coughs badly when eating. Coughs until she retches white mucus. Ankle edema resolved. She sleeps on 3 pillows because of her coughing. Dr. Virgina Jock as already told her she needs followup with Dr Olevia Perches GI. Denies sinus drip or wheeze but does occasionally sneeze. CXR 06/08/11- mild vascular congestion.  Diane Barker Acute OV  Complains of 4 weeks of cough, congestion, sinus drainage, wheezing Seen by primary care Dr. 3-4 weeks ago. Given antibiotic, unknown which one. Cough never got better Using Phenergan with codeine cough syrup without much relief. She denies any diarrhea, hemoptysis, orthopnea, PND, or leg swelling Chest x-ray June 2013 without any acute process noted  Diane Barker- 62  yoF never smoker followed for bronchitis with hx rhinitis, GERD, hx breast cancer.  PCP Dr Virgina Jock FOLLOWS FOR: constant cough-not productive enough to bring up-has been vomiting;chills since Thursday-denies any fevers; SOB when walking short distances, pain in right calf-has varicose veins as well. Has been going  on since Feb 26th 2015. Wheezing at times.  Dr Virgina Jock did CXR "fine". She has taken 3 rounds prednisone, claritin, mucinex. Gradually better but still coughs until she retches.  Diane Barker- 63 yoF never smoker followed for bronchitis with hx rhinitis, GERD, hx breast cancer, Hx DVT/ PE/ Xarelto.  PCP Dr Virgina Jock ACUTE VISIT: Pt having deep cough(dry), SOB at time with chest congestion, chills, diarrhea, vomiting night before. Nasal drainage-yellow.  Diane Barker- 63 yoF never smoker followed for bronchitis with hx rhinitis, GERD, hx breast cancer, Hx DVT/ PE/ Xarelto.  PCP Dr Virgina Jock FOLLOWS FOR: Pt having cough and sob and wheezing. She completed zpac but still on Prednisone and Omnicef. She is concerned that she continues to cough-now scant clear sputum. Finishing Omnicef and prednisone. Remains on Xarelto managed by her primary physician but she is concerned that a persistent sense of dyspnea and chest heaviness might mean she has had another blood clot. Denies pleuritic pain, hemoptysis, leg pain or swelling.  Diane Barker-  60 yoF never smoker followed for bronchitis with hx rhinitis, GERD, hx breast cancer, Hx DVT/ PE/ Xarelto.  PCP Dr Virgina Jock Dry cough and shortness of breath and some chest tightness in the last 2 weeks. Not a cold. Continues Xarelto but asks if this is similar to her previous pulmonary embolism. Thinks she may wheeze a little at times but breathing is better supine. ACUTE VISIT: Pt continues to have hacky cough,SOB and wheezing at times. Pt gives out quickly with short distance walks. CXR 06/07/14 IMPRESSION: There is no pneumonia nor other active cardiopulmonary abnormality. Electronically Signed  By: David Martinique  On: 06/07/2014 10:49  Review of Systems-See HPI Constitutional:   No-   weight loss, night sweats, fevers, chills, fatigue, lassitude. HEENT:   +  headaches, No-difficulty swallowing, tooth/dental problems, +sore throat,        sneezing, itching, ear ache, nasal congestion,  +post nasal drip,  CV:  No-   chest pain, orthopnea, PND, swelling in lower extremities, anasarca,  dizziness, palpitations Resp: +shortness of breath with exertion or at rest.              productive cough,  + non-productive cough,  No- coughing up of blood.              No-   change in color of mucus.  No- wheezing.   Skin: No-   rash or lesions. GI:  +  heartburn, indigestion, abdominal pain, +nausea, vomiting,  GU: MS:  No-   joint pain or swelling.   Neuro-     nothing unusual Psych:  No- change in mood or affect. No depression or anxiety.  No memory loss.  Objective:  OBJ- Physical Exam General- Alert, Oriented, Affect-appropriate, Distress- none acute Skin- rash-none, lesions- none, excoriation- none Lymphadenopathy- none Head- atraumatic            Eyes- Gross vision intact, PERRLA, conjunctivae and secretions clear            Ears- Hearing, canals-normal            Nose- + sniffing, no-Septal dev, mucus, polyps, erosion, perforation             Throat- Mallampati II , mucosa  clear , drainage- none, tonsils- atrophic Neck- flexible , trachea midline, no stridor , thyroid nl, carotid no bruit Chest - symmetrical excursion , unlabored           Heart/CV- RRR , no murmur , no gallop  , no rub, nl s1 s2                           - JVD- none , edema- none, stasis changes- none, varices- none           Lung- clear to P&A, unlabored, wheeze- none, cough-+ mild , dullness-none, rub- none           Chest wall-  Abd-  Br/ Gen/ Rectal- Not done, not indicated Extrem- cyanosis- none, clubbing, none, atrophy- none, strength- nl Neuro- grossly intact to observation

## 2015-02-06 NOTE — Assessment & Plan Note (Signed)
Nonspecific mild tracheobronchitis. Plan-nebulizer treatments Xopenex, Depo-Medrol, suggest cough drops and sips of liquids

## 2015-02-06 NOTE — Patient Instructions (Signed)
Neb xop 0.63  Depo 80  Throat lozenges, your cough syrup, sips of liquids, mucinex-DM all may help some

## 2015-02-06 NOTE — Assessment & Plan Note (Signed)
Possible mild exacerbation with a little postnasal drip but very slight and nonspecific.

## 2015-02-20 DIAGNOSIS — R252 Cramp and spasm: Secondary | ICD-10-CM | POA: Diagnosis not present

## 2015-03-09 ENCOUNTER — Telehealth: Payer: Self-pay | Admitting: Internal Medicine

## 2015-03-09 MED ORDER — PROMETHAZINE-CODEINE 6.25-10 MG/5ML PO SYRP: 5.0000 mL | ORAL_SOLUTION | Freq: Four times a day (QID) | ORAL | Status: DC | PRN

## 2015-03-09 NOTE — Telephone Encounter (Signed)
Spoke with pt. States that her cough is not improving. Reports cough has been on going since last month. Cough sounds like croup and is non-productive. Denies chest tightness, wheezing, SOB or fever. Would like something to be called in. CY - please advise. Thanks.  Allergies  Allergen Reactions  . Erythromycin     REACTION: stomach cramps  . Morphine     REACTION: Hallucinations  . Penicillins     REACTION: unkkown  . Relafen [Nabumetone] Nausea Only   Current Outpatient Prescriptions on File Prior to Visit  Medication Sig Dispense Refill  . Cholecalciferol (VITAMIN D) 2000 UNITS CAPS Take 1 capsule by mouth daily.    Marland Kitchen dexlansoprazole (DEXILANT) 60 MG capsule Take 60 mg by mouth daily.    . fluticasone (FLONASE) 50 MCG/ACT nasal spray Place 2 sprays into both nostrils daily.    . ranitidine (ZANTAC) 150 MG tablet Take 150 mg by mouth at bedtime.    Alveda Reasons 20 MG TABS tablet Take 1 tablet by mouth daily.  5   No current facility-administered medications on file prior to visit.

## 2015-03-09 NOTE — Telephone Encounter (Signed)
Offer prometh codeine cough syrup 200 ml,  5 ml every 6 hours if needed for cough  Advise otc Pepcid/ famotidine  1 daily before breakfast and 1 tablespoon Mylanta at bedftime

## 2015-03-09 NOTE — Telephone Encounter (Signed)
Pt is aware of CY's recommendations. Rx has been called in. Nothing further was needed.

## 2015-03-23 ENCOUNTER — Encounter: Payer: Self-pay | Admitting: Diagnostic Neuroimaging

## 2015-03-23 ENCOUNTER — Encounter (INDEPENDENT_AMBULATORY_CARE_PROVIDER_SITE_OTHER): Payer: Self-pay

## 2015-03-23 ENCOUNTER — Ambulatory Visit (INDEPENDENT_AMBULATORY_CARE_PROVIDER_SITE_OTHER): Payer: Medicare Other | Admitting: Diagnostic Neuroimaging

## 2015-03-23 VITALS — BP 147/80 | HR 64 | Ht 66.0 in | Wt 260.8 lb

## 2015-03-23 DIAGNOSIS — R404 Transient alteration of awareness: Secondary | ICD-10-CM | POA: Diagnosis not present

## 2015-03-23 DIAGNOSIS — Z79899 Other long term (current) drug therapy: Secondary | ICD-10-CM | POA: Diagnosis not present

## 2015-03-23 NOTE — Patient Instructions (Signed)
Thank you for coming to see us at [facility]. I hope we have been able to provide you high quality care today.  You may receive a patient satisfaction survey over the next few weeks. We would appreciate your feedback and comments so that we may continue to improve ourselves and the health of our patients.  - According to [place] law, you can not drive unless you are event/blackout/seizure free for at least 6 months and under physician's care.   - Please maintain blackout/seizure precautions. Do not participate in activities where a loss of awareness could harm you or someone else. No swimming alone, no tub bathing, no hot tubs, no driving, no operating motorized vehicles (cars, ATVs, motocycles, etc), lawnmowers or power tools. No standing at heights, such as rooftops, ladders or stairs. Avoid hot objects such as stoves, heaters, open fires. Wear a helmet when riding a bicycle, scooter, skateboard, etc. and avoid areas of traffic. Set your water heater to 120 degrees or less.    ~~~~~~~~~~~~~~~~~~~~~~~~~~~~~~~~~~~~~~~~~~~~~~~~~~~~~~~~~~~~~~~~~  DR. PENUMALLI'S GUIDE TO HAPPY AND HEALTHY LIVING These are some of my general health and wellness recommendations. Some of them may apply to you better than others. Please use common sense as you try these suggestions and feel free to ask me any questions.   ACTIVITY/FITNESS Mental, social, emotional and physical stimulation are very important for brain and body health. Try learning a new activity (arts, music, language, sports, games).  Keep moving your body to the best of your abilities. You can do this at home, inside or outside, the park, community center, gym or anywhere you like. Consider a physical therapist or personal trainer to get started. Consider the app Sworkit. Fitness trackers such as smart-watches, smart-phones or Fitbits can help as well.   NUTRITION Eat more plants: colorful vegetables, nuts, seeds and berries.  Eat  less sugar, salt, preservatives and processed foods.  Avoid toxins such as cigarettes and alcohol.  Drink water when you are thirsty. Warm water with a slice of lemon is an excellent morning drink to start the day.  Consider these websites for more information The Nutrition Source (https://www.hsph.[employer].edu/nutritionsource) Precision Nutrition (www.precisionnutrition.com/blog/infographics)   RELAXATION Consider practicing mindfulness meditation or other relaxation techniques such as deep breathing, prayer, yoga, tai chi, massage. See website mindful.org or the apps Headspace or Calm to help get started.   SLEEP Try to get at least 7-8+ hours sleep per day. Regular exercise and reduced caffeine will help you sleep better. Practice good sleep hygeine techniques. See website sleep.org for more information.   PLANNING Prepare estate planning, living will, healthcare POA documents. Sometimes this is best planned with the help of an attorney. Theconversationproject.org and agingwithdignity.org are excellent resources. 
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
## 2015-03-24 LAB — HEMOGLOBIN A1C
Est. average glucose Bld gHb Est-mCnc: 120 mg/dL
Hgb A1c MFr Bld: 5.8 % — ABNORMAL HIGH (ref 4.8–5.6)

## 2015-03-24 LAB — TSH: TSH: 1.65 u[IU]/mL (ref 0.450–4.500)

## 2015-03-24 LAB — VITAMIN B12: VITAMIN B 12: 362 pg/mL (ref 211–946)

## 2015-03-28 ENCOUNTER — Telehealth: Payer: Self-pay | Admitting: *Deleted

## 2015-03-28 NOTE — Telephone Encounter (Signed)
Per Dr Leta Baptist, spoke with patient and informed her that her lab results are unremarkable. She verbalized understanding.

## 2015-03-30 ENCOUNTER — Ambulatory Visit (INDEPENDENT_AMBULATORY_CARE_PROVIDER_SITE_OTHER): Payer: Medicare Other | Admitting: Neurology

## 2015-03-30 ENCOUNTER — Encounter: Payer: Self-pay | Admitting: Neurology

## 2015-03-30 VITALS — BP 130/78 | HR 68 | Resp 16 | Ht 66.0 in | Wt 261.0 lb

## 2015-03-30 DIAGNOSIS — G2581 Restless legs syndrome: Secondary | ICD-10-CM

## 2015-03-30 DIAGNOSIS — R600 Localized edema: Secondary | ICD-10-CM | POA: Diagnosis not present

## 2015-03-30 DIAGNOSIS — E669 Obesity, unspecified: Secondary | ICD-10-CM | POA: Diagnosis not present

## 2015-03-30 DIAGNOSIS — R351 Nocturia: Secondary | ICD-10-CM | POA: Diagnosis not present

## 2015-03-30 DIAGNOSIS — G479 Sleep disorder, unspecified: Secondary | ICD-10-CM

## 2015-03-30 DIAGNOSIS — R6889 Other general symptoms and signs: Secondary | ICD-10-CM
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
## 2015-03-30 NOTE — Patient Instructions (Signed)

## 2015-04-04 ENCOUNTER — Ambulatory Visit
Admission: RE | Admit: 2015-04-04 | Discharge: 2015-04-04 | Disposition: A | Discharge status: Home / Self Care | Payer: Medicare Other | Source: Ambulatory Visit | Attending: Diagnostic Neuroimaging | Admitting: Diagnostic Neuroimaging

## 2015-04-04 DIAGNOSIS — R404 Transient alteration of awareness: Secondary | ICD-10-CM

## 2015-04-04 MED ORDER — GADOBENATE DIMEGLUMINE 529 MG/ML IV SOLN
20.0000 mL | Freq: Once | INTRAVENOUS | Status: AC | PRN
Start: 2015-04-04 — End: 2015-04-04
  Administered 2015-04-04: 20 mL via INTRAVENOUS

## 2015-04-10 DIAGNOSIS — I872 Venous insufficiency (chronic) (peripheral): Secondary | ICD-10-CM | POA: Diagnosis not present

## 2015-04-10 DIAGNOSIS — Z6841 Body Mass Index (BMI) 40.0 and over, adult: Secondary | ICD-10-CM | POA: Diagnosis not present

## 2015-04-10 DIAGNOSIS — M7989 Other specified soft tissue disorders: Secondary | ICD-10-CM | POA: Diagnosis not present

## 2015-04-10 DIAGNOSIS — Z7901 Long term (current) use of anticoagulants: Secondary | ICD-10-CM | POA: Diagnosis not present

## 2015-04-15 ENCOUNTER — Ambulatory Visit (INDEPENDENT_AMBULATORY_CARE_PROVIDER_SITE_OTHER): Payer: Medicare Other | Admitting: Neurology

## 2015-04-15 DIAGNOSIS — G4720 Circadian rhythm sleep disorder, unspecified type: Secondary | ICD-10-CM

## 2015-04-15 DIAGNOSIS — G4733 Obstructive sleep apnea (adult) (pediatric): Secondary | ICD-10-CM

## 2015-04-15 DIAGNOSIS — G4761 Periodic limb movement disorder: Secondary | ICD-10-CM

## 2015-04-15 NOTE — Sleep Study (Signed)
Please see the scanned sleep study interpretation located in the Procedure tab within the Chart Review section. 

## 2015-04-19 ENCOUNTER — Telehealth: Payer: Self-pay | Admitting: Neurology

## 2015-04-19 NOTE — Telephone Encounter (Signed)
Patient referred by Dr. Leta Baptist, seen by me on 03/30/15, diagnostic PSG on 04/15/15.   Please call and notify the patient that the recent sleep study did not show any significant obstructive sleep apnea, only mild in dream/REM sleep, for which I recommend avoiding the supine sleep position and weight loss; CPAP therapy is not warranted. She had leg twitching in her sleep. Please inform patient that I would like to go over the details of the study during a follow up appointment. Arrange a followup appointment. Also, route or fax report to PCP and referring MD, if other than PCP.  Once you have spoken to patient, you can close this encounter.   Thanks,  Star Age, MD, PhD [facility])

## 2015-04-19 NOTE — Telephone Encounter (Signed)
I spoke to patient and she is aware of results and recommendations. I offered her f/u appt but she wanted to f/u with PCP for this. We will send copy of report to PCP.

## 2015-04-25 ENCOUNTER — Other Ambulatory Visit: Payer: Medicare Other

## 2015-04-25 ENCOUNTER — Ambulatory Visit (INDEPENDENT_AMBULATORY_CARE_PROVIDER_SITE_OTHER): Payer: Medicare Other | Admitting: Diagnostic Neuroimaging

## 2015-04-25 DIAGNOSIS — R404 Transient alteration of awareness: Secondary | ICD-10-CM

## 2015-04-25 DIAGNOSIS — R410 Disorientation, unspecified: Secondary | ICD-10-CM | POA: Diagnosis not present

## 2015-04-30 ENCOUNTER — Encounter: Payer: Self-pay | Admitting: Internal Medicine

## 2015-04-30 ENCOUNTER — Ambulatory Visit (INDEPENDENT_AMBULATORY_CARE_PROVIDER_SITE_OTHER): Payer: Medicare Other | Admitting: Internal Medicine

## 2015-04-30 VITALS — BP 120/76 | HR 69 | Ht 65.5 in | Wt 262.4 lb

## 2015-04-30 DIAGNOSIS — R0600 Dyspnea, unspecified: Secondary | ICD-10-CM

## 2015-04-30 DIAGNOSIS — I2699 Other pulmonary embolism without acute cor pulmonale: Secondary | ICD-10-CM | POA: Diagnosis not present

## 2015-04-30 DIAGNOSIS — J440 Chronic obstructive pulmonary disease with acute lower respiratory infection: Secondary | ICD-10-CM | POA: Diagnosis not present

## 2015-04-30 DIAGNOSIS — M79661 Pain in right lower leg: Secondary | ICD-10-CM | POA: Diagnosis not present

## 2015-04-30 DIAGNOSIS — R0609 Other forms of dyspnea: Secondary | ICD-10-CM

## 2015-04-30 DIAGNOSIS — J209 Acute bronchitis, unspecified: Secondary | ICD-10-CM

## 2015-04-30 NOTE — Progress Notes (Signed)
Patient ID: Diane Barker, female    DOB: [birth year], 65 y.o.   MRN: [id number]  HPI 08/02/10- 47 yoF never smoker followed for bronchitis with hx rhinitis, GERD, hx breast cancer. 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 Woke 2 weeks ago with persistent cough, fever, chilling. Still wakes after night sweat. We got her a cough syrup when her cough began to hurt her back. 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 She describes this as a cold. Dr Virgina Jock gave bactrim at outset- no help. 2days ago she called [place] for Z pak and cough syrup- day 3. Now slowly better.   08/22/10- bronchitis, hx rhinitis, GERD hx breast Ca Cough got better, but for 4 days has had pain over right eye, increased cough, clear mucus. Denies fever, sore throat. 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   02/05/11-  [name] never smoker followed for bronchitis with hx rhinitis, GERD, hx breast cancer. Declines flu vaccine. Advair sample did help but she didn't refill it. In the last several days she has had increased cough and drainage, sneezing, possibly an early cold. Had some chills this morning but no fever and nothing purulent.  02/28/11-  [name] never smoker followed for bronchitis with hx rhinitis, GERD, hx breast cancer. Has declined flu shot. Now presents with acute illness including sore throat global headache, constant hard cough until she retches and can't sleep. Denies fever, nausea, myalgias.  08/08/11- [name] never smoker followed for bronchitis with hx rhinitis, GERD, hx breast cancer.  PCP Dr Oran Rein has been helpful for controlling her acid reflux symptoms. She failed Prilosec. Cough has also gotten much better and began taking Dexilant. Denies problems from spring pollen.  History of right lumpectomy with chemotherapy and radiation therapy. CT  01/22/09- reviewed with her.   IMPRESION:  1. No acute abnormality.  2. Right aortic arch unchanged, as  described above.  Provider: Sherle Poe  CXR TODAY 06/08/11- reviewed with her: Findings: The patient is rotated to the right. Abnormal  mediastinal contour again noted, consistent with the known right-  sided aortic arch. Cardiopericardial silhouette is at upper limits  of normal for size. There is mild vascular congestion without  overt airspace pulmonary edema. Bones are diffusely demineralized.  IMPRESSION:  Stable exam when taking into account the rotation. No new or acute  interval findings.  Original Report Authenticated By: ERIC A. MANSELL, M.D.   09/01/11-  [name] never smoker followed for bronchitis with hx rhinitis, GERD, hx breast cancer.  PCP Dr Virgina Jock ACUTE VISIT: cough-dry and hacky, wheezing, SOB, concerned if fluid is off lungs as well; not sleeping due to cough-rough past 3 days. Persistent hacking cough started about 7 days after last here. She is aware of reflux despite her Dexilant. Coughs badly when eating. Coughs until she retches white mucus. Ankle edema resolved. She sleeps on 3 pillows because of her coughing. Dr. Virgina Jock as already told her she needs followup with Dr Olevia Perches GI. Denies sinus drip or wheeze but does occasionally sneeze. CXR 06/08/11- mild vascular congestion.  01/16/2012 Acute OV  Complains of 4 weeks of cough, congestion, sinus drainage, wheezing Seen by primary care Dr. 3-4 weeks ago. Given antibiotic, unknown which one. Cough never got better Using Phenergan with codeine cough syrup without much relief. She denies any diarrhea, hemoptysis, orthopnea, PND, or leg swelling Chest x-ray June 2013 without any acute process noted  06/20/13- 62  yoF never smoker followed for bronchitis with hx rhinitis, GERD, hx breast cancer.  PCP Dr Virgina Jock FOLLOWS FOR: constant cough-not productive enough to bring up-has been vomiting;chills since Thursday-denies any fevers; SOB when walking short distances, pain in right calf-has varicose veins as well. Has been going  on since Feb 26th 2015. Wheezing at times.  Dr Virgina Jock did CXR "fine". She has taken 3 rounds prednisone, claritin, mucinex. Gradually better but still coughs until she retches.  04/27/14- 63 yoF never smoker followed for bronchitis with hx rhinitis, GERD, hx breast cancer, Hx DVT/ PE/ Xarelto.  PCP Dr Virgina Jock ACUTE VISIT: Pt having deep cough(dry), SOB at time with chest congestion, chills, diarrhea, vomiting night before. Nasal drainage-yellow.  05/09/14- 63 yoF never smoker followed for bronchitis with hx rhinitis, GERD, hx breast cancer, Hx DVT/ PE/ Xarelto.  PCP Dr Virgina Jock FOLLOWS FOR: Pt having cough and sob and wheezing. She completed zpac but still on Prednisone and Omnicef. She is concerned that she continues to cough-now scant clear sputum. Finishing Omnicef and prednisone. Remains on Xarelto managed by her primary physician but she is concerned that a persistent sense of dyspnea and chest heaviness might mean she has had another blood clot. Denies pleuritic pain, hemoptysis, leg pain or swelling.  02/06/15-  24 yoF never smoker followed for bronchitis with hx rhinitis, GERD, hx breast cancer, Hx DVT/ PE/ Xarelto.  PCP Dr Virgina Jock Dry cough and shortness of breath and some chest tightness in the last 2 weeks. Not a cold. Continues Xarelto but asks if this is similar to her previous pulmonary embolism. Thinks she may wheeze a little at times but breathing is better supine. ACUTE VISIT: Pt continues to have hacky cough,SOB and wheezing at times. Pt gives out quickly with short distance walks. CXR 06/07/14 IMPRESSION: There is no pneumonia nor other active cardiopulmonary abnormality. Electronically Signed  By: David Martinique  On: 06/07/2014 10:49  04/30/2015-65 year old female never smoker followed for bronchitis, rhinitis, complicated by GERD, history of breast cancer/ XRT, history DVT/PE/Xarelto FOLLOWS FOR: Pt states she continues to have SOB-not as bad as before. Has noticed increased red area  on her legs with small open wound like areas. Pt would like to have handicap placard completed as her current one is running out.   Some dyspnea with exertion but no routine wheeze or cough, chest pain or palpitation. Zantac helped cough-discussed role of GERD. Having workup for syncopal episode Office Spirometry 04/30/2015- WNL- FVC 2.75/87%, FEV1 2.29/93%, FEV1/FVC 0.83, FEF 25-75 percent 2.83/126%.  Review of Systems-See HPI Constitutional:   No-   weight loss, night sweats, fevers, chills, fatigue, lassitude. HEENT:   +  headaches, No-difficulty swallowing, tooth/dental problems, +sore throat,        sneezing, itching, ear ache, nasal congestion, +post nasal drip,  CV:  No-   chest pain, orthopnea, PND, swelling in lower extremities, anasarca,  dizziness, palpitations Resp: +shortness of breath with exertion or at rest.              productive cough,  + non-productive cough,  No- coughing up of blood.              No-   change in color of mucus.  No- wheezing.   Skin: No-   rash or lesions. GI:  +  heartburn, indigestion, abdominal pain, +nausea, vomiting,  GU: MS:  No-   joint pain or swelling.   Neuro-     nothing unusual Psych:  No- change in mood or affect.  No depression or anxiety.  No memory loss.  Objective:  OBJ- Physical Exam General- Alert, Oriented, Affect-appropriate, Distress- none acute, + overweight Skin- rash-none, lesions- none, excoriation- none Lymphadenopathy- none Head- atraumatic            Eyes- Gross vision intact, PERRLA, conjunctivae and secretions clear            Ears- Hearing, canals-normal            Nose- + sniffing, no-Septal dev, mucus, polyps, erosion, perforation             Throat- Mallampati II , mucosa clear , drainage- none, tonsils- atrophic Neck- flexible , trachea midline, no stridor , thyroid nl, carotid no bruit Chest - symmetrical excursion , unlabored           Heart/CV- RRR , no murmur , no gallop  , no rub, nl s1 s2                            - JVD- none , edema +1, stasis changes- none, varices- none           Lung- + coarse breath sounds, unlabored, wheeze- none, cough-+ mild , dullness-none, rub- none           Chest wall-  Abd-  Br/ Gen/ Rectal- Not done, not indicated Extrem- cyanosis- none, clubbing, none, atrophy- none, strength- nl, + excoriated stasis dermatitis Neuro- grossly intact to observation

## 2015-04-30 NOTE — Patient Instructions (Addendum)
Order - office spirometry    Dx dyspnea  To avoid reflux, stay sitting or standing for an hour after eating   Suggest support hose to reduce edema and itching in your legs. Soothing skin lotion like Lubriderm or Eucerin may also help stop itching

## 2015-05-03 NOTE — Procedures (Signed)
   [facility]  EEG (ELECTROENCEPHALOGRAM) REPORT   STUDY DATE: 04/25/15 PATIENT NAME: Diane Barker DOB: 15-May-1950 MRN: [id number]  ORDERING CLINICIAN: Andrey Spearman, MD   TECHNOLOGIST: Laretta Alstrom  TECHNIQUE: Electroencephalogram was recorded utilizing standard 10-20 system of lead placement and reformatted into average and bipolar montages.  RECORDING TIME: 32 minutes ACTIVATION: hyperventilation and photic stimulation  CLINICAL INFORMATION: 65 year old female with transient loss of consciousness  FINDINGS: Background rhythms of 9-10 hertz and 15020 microvolts. No focal, lateralizing, epileptiform activity or seizures are seen. Patient recorded in the awake and drowsy state. EKG channel shows regular rhythm 60 beats per minute.  IMPRESSION:  Normal EEG in the awake state.    INTERPRETING PHYSICIAN:  Penni Bombard, MD Certified in Neurology, Neurophysiology and Neuroimaging  [facility] [address], [place], [place] [address] [phone]

## 2015-05-11 ENCOUNTER — Encounter: Payer: Self-pay | Admitting: Gastroenterology

## 2015-05-14 ENCOUNTER — Ambulatory Visit (INDEPENDENT_AMBULATORY_CARE_PROVIDER_SITE_OTHER): Payer: Medicare Other | Admitting: Diagnostic Neuroimaging

## 2015-05-14 ENCOUNTER — Encounter: Payer: Self-pay | Admitting: Diagnostic Neuroimaging

## 2015-05-14 VITALS — BP 136/77 | HR 68 | Ht 65.5 in | Wt 265.0 lb

## 2015-05-14 DIAGNOSIS — G2581 Restless legs syndrome: Secondary | ICD-10-CM

## 2015-05-14 DIAGNOSIS — R404 Transient alteration of awareness: Secondary | ICD-10-CM | POA: Diagnosis not present
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
## 2015-05-14 NOTE — Patient Instructions (Signed)
-   monitor symptoms - no driving x 6 months event free (last event 03/02/15) - may consider MRI lumbar spine for leg pain eval vs follow up with orthopedic clinic

## 2015-05-17 ENCOUNTER — Other Ambulatory Visit: Payer: Self-pay | Admitting: *Deleted

## 2015-05-17 ENCOUNTER — Other Ambulatory Visit: Payer: Self-pay

## 2015-05-17 DIAGNOSIS — Z1231 Encounter for screening mammogram for malignant neoplasm of breast: Secondary | ICD-10-CM

## 2015-05-18 DIAGNOSIS — R946 Abnormal results of thyroid function studies: Secondary | ICD-10-CM | POA: Diagnosis not present

## 2015-05-18 DIAGNOSIS — E559 Vitamin D deficiency, unspecified: Secondary | ICD-10-CM | POA: Diagnosis not present

## 2015-05-18 DIAGNOSIS — R739 Hyperglycemia, unspecified: Secondary | ICD-10-CM | POA: Diagnosis not present

## 2015-05-18 DIAGNOSIS — M810 Age-related osteoporosis without current pathological fracture: Secondary | ICD-10-CM | POA: Diagnosis not present

## 2015-05-18 DIAGNOSIS — D519 Vitamin B12 deficiency anemia, unspecified: Secondary | ICD-10-CM | POA: Diagnosis not present

## 2015-05-18 DIAGNOSIS — E784 Other hyperlipidemia: Secondary | ICD-10-CM | POA: Diagnosis not present

## 2015-05-24 DIAGNOSIS — R0609 Other forms of dyspnea: Secondary | ICD-10-CM

## 2015-05-24 NOTE — Assessment & Plan Note (Signed)
Multifactorial with obesity, deconditioning, peripheral venous insufficiency with peripheral edema Consider possibility of chronic pulmonary embolus

## 2015-05-24 NOTE — Assessment & Plan Note (Signed)
Currently near baseline with little cough or wheeze at this visit. Encouraging that spirometry can be normal on a good day.

## 2015-05-24 NOTE — Assessment & Plan Note (Signed)
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

## 2015-05-24 NOTE — Assessment & Plan Note (Addendum)
Continues Xarelto with no recurrence and no abnormal bleeding reported Consider possibility of chronic pulmonary embolism contributing to dyspnea

## 2015-05-25 DIAGNOSIS — Z0000 Encounter for general adult medical examination without abnormal findings: Secondary | ICD-10-CM | POA: Diagnosis not present

## 2015-05-25 DIAGNOSIS — G629 Polyneuropathy, unspecified: Secondary | ICD-10-CM | POA: Diagnosis not present

## 2015-05-25 DIAGNOSIS — D692 Other nonthrombocytopenic purpura: Secondary | ICD-10-CM | POA: Diagnosis not present

## 2015-05-25 DIAGNOSIS — D519 Vitamin B12 deficiency anemia, unspecified: Secondary | ICD-10-CM | POA: Diagnosis not present

## 2015-05-25 DIAGNOSIS — Z1389 Encounter for screening for other disorder: Secondary | ICD-10-CM | POA: Diagnosis not present

## 2015-05-25 DIAGNOSIS — Z6841 Body Mass Index (BMI) 40.0 and over, adult: Secondary | ICD-10-CM | POA: Diagnosis not present

## 2015-05-25 DIAGNOSIS — R413 Other amnesia: Secondary | ICD-10-CM | POA: Diagnosis not present

## 2015-05-25 DIAGNOSIS — R946 Abnormal results of thyroid function studies: Secondary | ICD-10-CM | POA: Diagnosis not present

## 2015-05-25 DIAGNOSIS — I80201 Phlebitis and thrombophlebitis of unspecified deep vessels of right lower extremity: Secondary | ICD-10-CM | POA: Diagnosis not present

## 2015-05-31 ENCOUNTER — Ambulatory Visit: Payer: Medicare Other

## 2015-06-21 DIAGNOSIS — I872 Venous insufficiency (chronic) (peripheral): Secondary | ICD-10-CM | POA: Diagnosis not present

## 2015-06-21 DIAGNOSIS — L308 Other specified dermatitis: Secondary | ICD-10-CM | POA: Diagnosis not present

## 2015-07-31 DIAGNOSIS — L309 Dermatitis, unspecified: Secondary | ICD-10-CM | POA: Diagnosis not present

## 2015-08-02 ENCOUNTER — Telehealth: Payer: Self-pay | Admitting: Internal Medicine

## 2015-08-02 MED ORDER — PREDNISONE 10 MG PO TABS
ORAL_TABLET | ORAL | Status: DC
Start: 2015-08-02 — End: 2015-11-12

## 2015-08-02 NOTE — Telephone Encounter (Signed)
Pruritic rash on legs, suspect chiggers Plan- prednisone light taper

## 2015-08-27 IMAGING — CT CT ANGIO CHEST
2 of 9 series · 18 of 46 positions shown · IV contrast (Omni 300)
Comparison: 01/22/2009.

CLINICAL DATA: Right leg pain, elevated D-dimer, possible PE

EXAM:
CT ANGIOGRAPHY CHEST WITH CONTRAST
TECHNIQUE: Multidetector CT imaging of the chest was performed using the
standard protocol during bolus administration of intravenous
contrast. Multiplanar CT image reconstructions and MIPs were
obtained to evaluate the vascular anatomy.
CONTRAST:  100mL OMNIPAQUE IOHEXOL 350 MG/ML SOLN

[Series 5: thins · axial · 0.87mm/px · z∈[+17,+247]mm · 15 of 260 slices shown]
[im 15/260  lung]
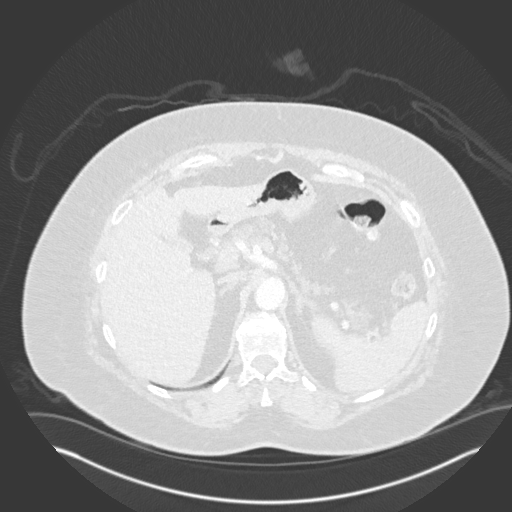
[im 29/260  soft-tissue]
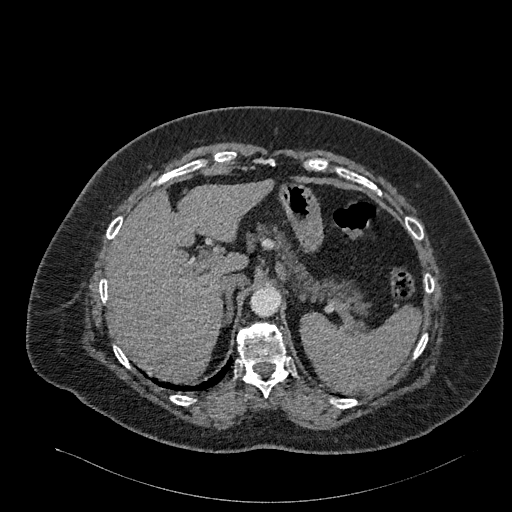
[im 44/260  lung]
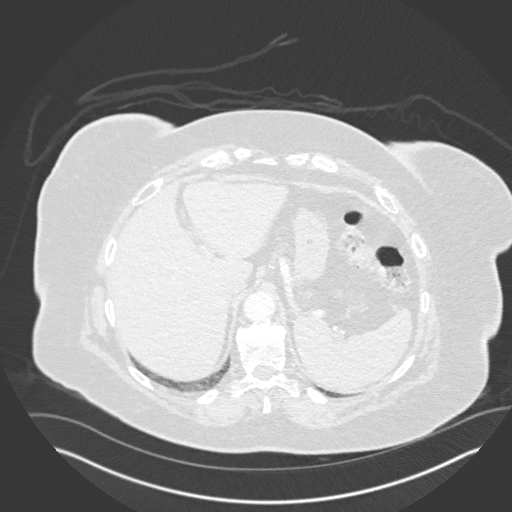
[im 58/260  soft-tissue]
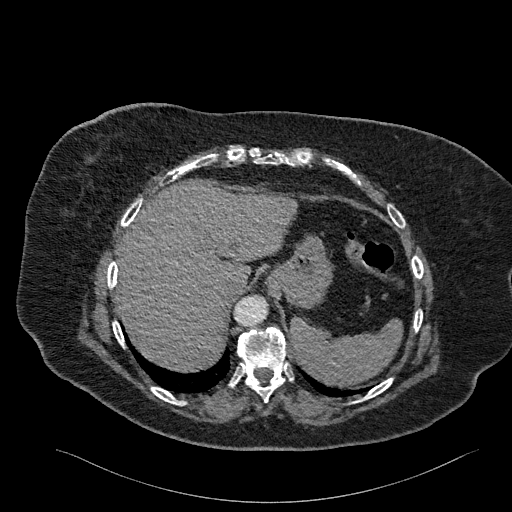
[im 87/260  lung]
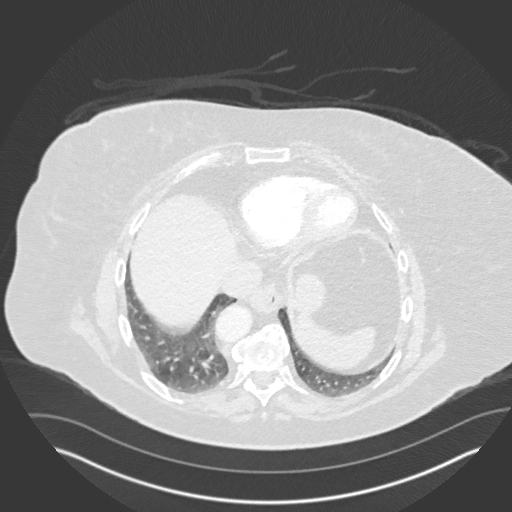
[im 101/260  soft-tissue]
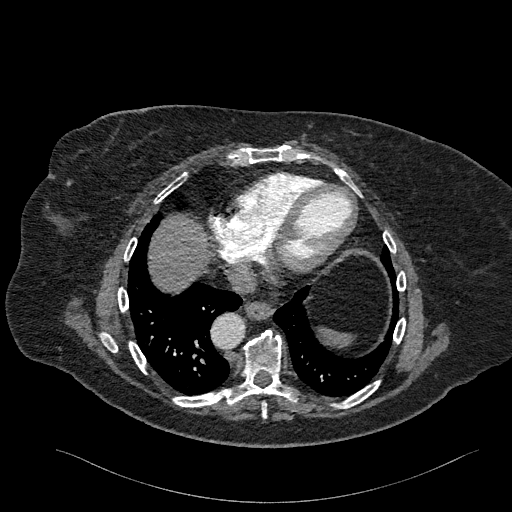
[im 116/260  lung]
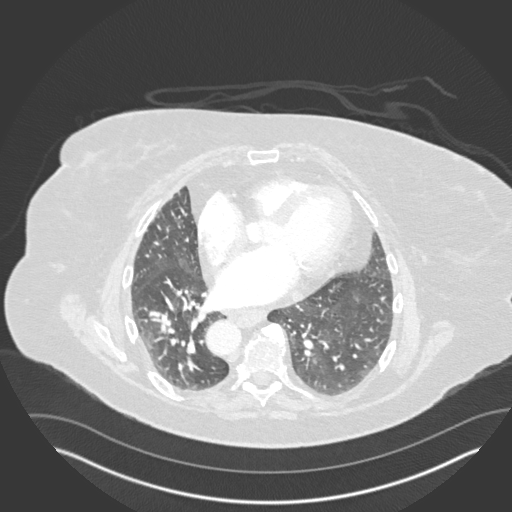
[im 130/260  soft-tissue]
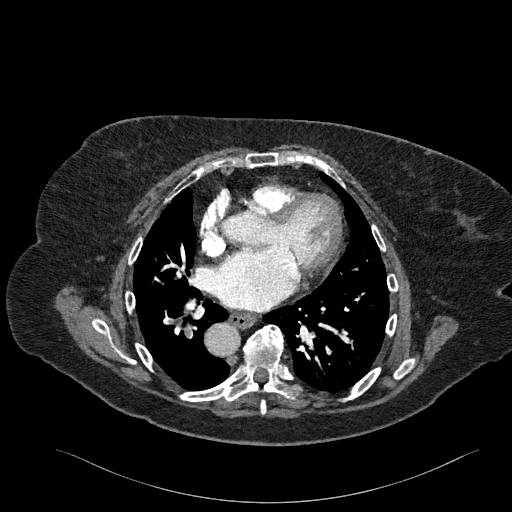
[im 144/260  lung]
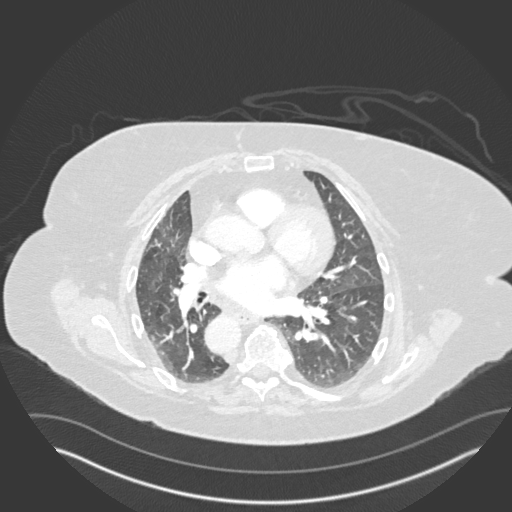
[im 159/260  soft-tissue]
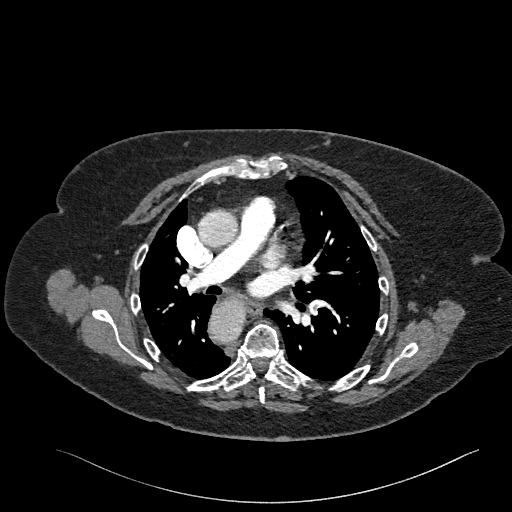
[im 173/260  lung]
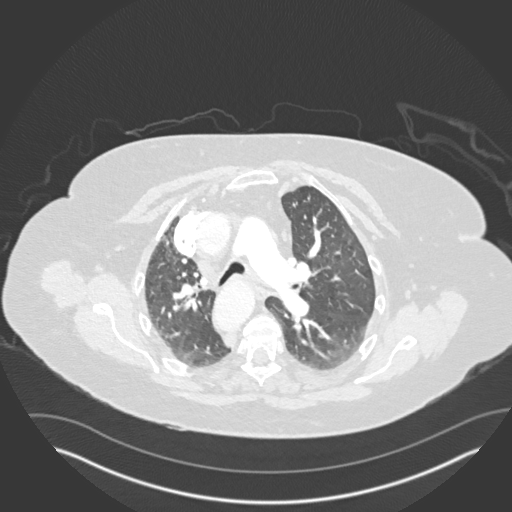
[im 202/260  soft-tissue]
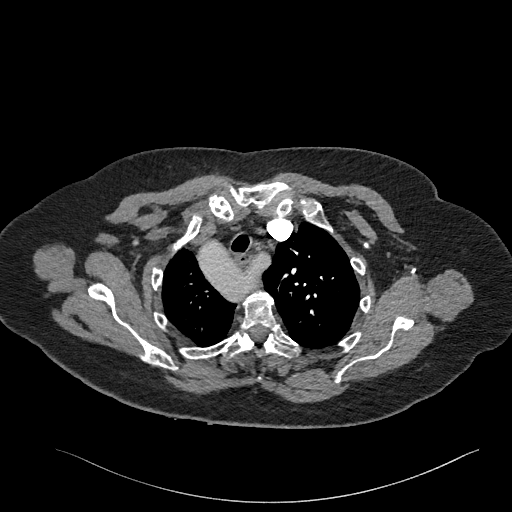
[im 216/260  lung]
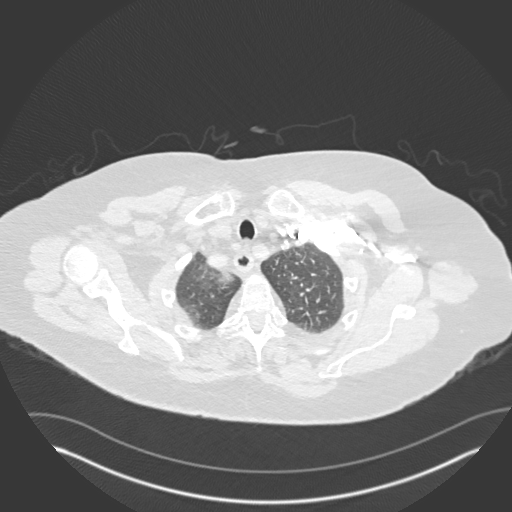
[im 231/260  soft-tissue]
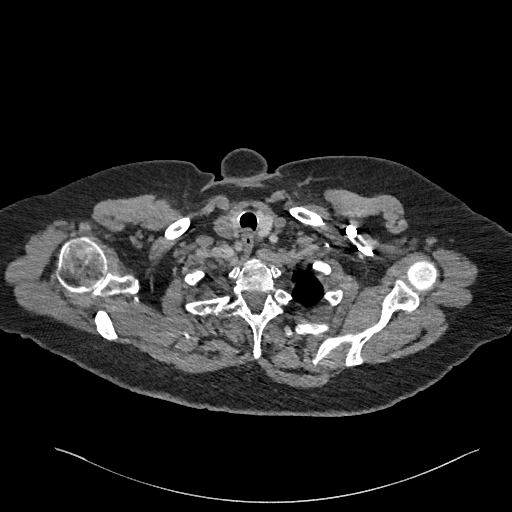
[im 245/260  lung]
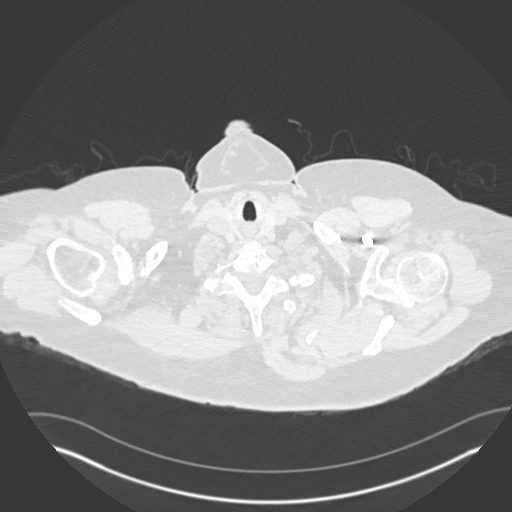

[Series 7: coronal mpr · coronal · 0.52mm/px · 3 of 151 slices shown]
[im 38/151  soft-tissue]
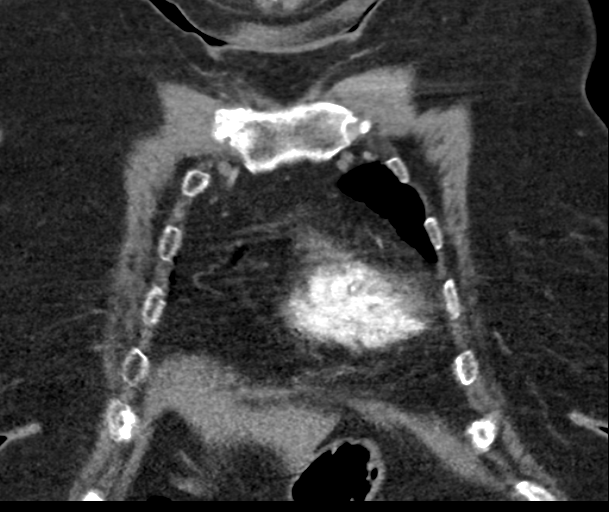
[im 76/151  soft-tissue]
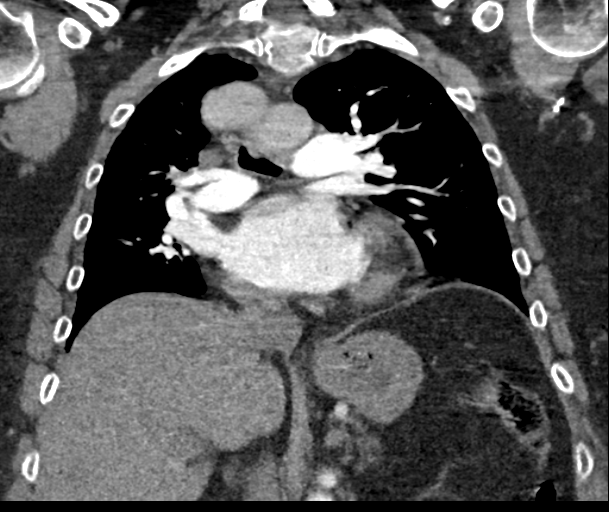
[im 113/151  soft-tissue]
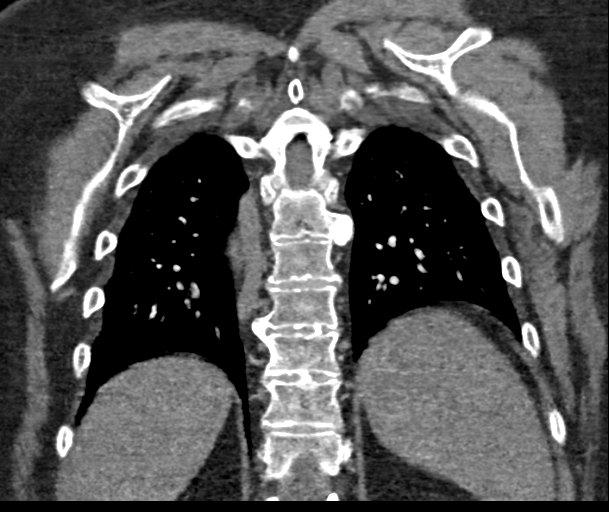

[18 of 46 positions shown; findings below may reference images not displayed]

FINDINGS: The study is of excellent technical quality. Again noted right
aortic arch. Heart size is stable. No pericardial effusion. No
mediastinal hematoma or adenopathy.

The study is positive for pulmonary emboli seen in right lower lobe
branches see axial image 46 also thrombosis is noted in at least 3
segmental branches in left lower lobe. Axial image 40 and 37.

Sagittal images of the spine shows diffuse osteopenia. Degenerative
changes mid and lower thoracic spine. Sagittal view of the sternum
is unremarkable.

The visualized upper abdomen shows stable low-density lesion within
left hepatic lobe. Partial fatty replacement of the pancreas again
noted. No adrenal gland mass is noted.

No calcified gallstones are noted within visualized gallbladder.

Images of the lung parenchyma shows no acute infiltrate or pleural
effusion. No pulmonary edema.

***Positive for acute PE with CT evidence of right heart strain
(RV/LV Ratio = 0.9) consistent with at least submassive
(intermediate risk) PE. The presence of right heart strain has been
associated with an increased risk of morbidity and mortality.
Consultation with Pulmonary and [REDACTED] is
recommended.

Review of the MIP images confirms the above findings.
IMPRESSION: 1. There are bilateral pulmonary emboli in bilateral lower lobe
segmental pulmonary artery branches as described above. No evidence
of lung infarction or focal consolidation.
***Positive for acute PE with CT evidence of right heart strain
(RV/LV Ratio = 0.9) consistent with at least submassive
(intermediate risk) PE. The presence of right heart strain has been
associated with an increased risk of morbidity and mortality.
Consultation with Pulmonary and [REDACTED] is
recommended.

1. No mediastinal hematoma or adenopathy.
2. No acute infiltrate or pulmonary edema.
3. Degenerative changes thoracic spine.
4. Again noted right aortic arch.
I discussed with Dr. Adaego

## 2015-08-30 ENCOUNTER — Telehealth: Payer: Self-pay | Admitting: Internal Medicine

## 2015-08-30 DIAGNOSIS — L500 Allergic urticaria: Secondary | ICD-10-CM

## 2015-08-30 NOTE — Telephone Encounter (Signed)
Per CY: okay to order Allergy full profile and CBC diff for allergic urticaria since patient has had outbreak on both legs without clearing with creams, abx's, etc. Questionable allergy to grass since rash happens after mowing her yard.    Pt is aware that lab orders have been placed and will come by the lab to have drawn.

## 2015-09-03 ENCOUNTER — Telehealth: Payer: Self-pay | Admitting: Diagnostic Neuroimaging

## 2015-09-03 NOTE — Telephone Encounter (Signed)
Patient returned your call and requesting a call back please.

## 2015-09-03 NOTE — Telephone Encounter (Signed)
Pt is asking if she can drive again as she understood the doctor to say she could drive in July.  Please call.

## 2015-09-03 NOTE — Telephone Encounter (Signed)
Spoke with patient who stated she has had no further episodes at all since her first and only episode in Dec 2016. She stated she has been fine. She stated she still thinks that it was due to medication she was taking at the time. She stated her two children have moved out of town, and she is having difficulty getting rides to the store.  She is asking if she may drive again. Informed her per Dr Gladstone Lighter last office visit note, she may drive if she has been event free x 6 months. She stated again that she has had no problems at all. Informed her that 6 months from event on 03/02/15 was August 31, 2015.  She verbalized understanding, appreciation. Reminded her of FU in Aug.

## 2015-09-03 NOTE — Telephone Encounter (Signed)
LVM requesting patient call back. Left name, number.

## 2015-09-24 DIAGNOSIS — E559 Vitamin D deficiency, unspecified: Secondary | ICD-10-CM | POA: Diagnosis not present

## 2015-09-24 DIAGNOSIS — M810 Age-related osteoporosis without current pathological fracture: Secondary | ICD-10-CM | POA: Diagnosis not present

## 2015-10-24 DIAGNOSIS — M810 Age-related osteoporosis without current pathological fracture: Secondary | ICD-10-CM | POA: Diagnosis not present

## 2015-10-24 DIAGNOSIS — Z79899 Other long term (current) drug therapy: Secondary | ICD-10-CM | POA: Diagnosis not present

## 2015-10-24 DIAGNOSIS — Z833 Family history of diabetes mellitus: Secondary | ICD-10-CM | POA: Diagnosis not present

## 2015-10-24 DIAGNOSIS — Z6841 Body Mass Index (BMI) 40.0 and over, adult: Secondary | ICD-10-CM | POA: Diagnosis not present

## 2015-10-29 ENCOUNTER — Ambulatory Visit: Payer: Medicare Other | Admitting: Internal Medicine

## 2015-11-07 ENCOUNTER — Other Ambulatory Visit (HOSPITAL_COMMUNITY): Payer: Self-pay | Admitting: *Deleted

## 2015-11-07 DIAGNOSIS — L282 Other prurigo: Secondary | ICD-10-CM | POA: Diagnosis not present

## 2015-11-07 DIAGNOSIS — I872 Venous insufficiency (chronic) (peripheral): Secondary | ICD-10-CM | POA: Diagnosis not present

## 2015-11-07 DIAGNOSIS — I8311 Varicose veins of right lower extremity with inflammation: Secondary | ICD-10-CM | POA: Diagnosis not present

## 2015-11-07 DIAGNOSIS — I8312 Varicose veins of left lower extremity with inflammation: Secondary | ICD-10-CM | POA: Diagnosis not present

## 2015-11-08 ENCOUNTER — Ambulatory Visit (HOSPITAL_COMMUNITY): Admission: RE | Admit: 2015-11-08 | Payer: Medicare Other | Source: Ambulatory Visit

## 2015-11-12 ENCOUNTER — Encounter: Payer: Self-pay | Admitting: Diagnostic Neuroimaging

## 2015-11-12 ENCOUNTER — Ambulatory Visit (INDEPENDENT_AMBULATORY_CARE_PROVIDER_SITE_OTHER): Payer: Medicare Other | Admitting: Diagnostic Neuroimaging

## 2015-11-12 VITALS — BP 137/81 | HR 60 | Wt 255.2 lb

## 2015-11-12 DIAGNOSIS — G2581 Restless legs syndrome: Secondary | ICD-10-CM

## 2015-11-12 DIAGNOSIS — R404 Transient alteration of awareness: Secondary | ICD-10-CM | POA: Diagnosis not present
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
## 2015-11-12 NOTE — Patient Instructions (Signed)
-   monitor symptoms

## 2015-11-15 ENCOUNTER — Encounter (HOSPITAL_COMMUNITY): Payer: Medicare Other

## 2015-11-20 ENCOUNTER — Other Ambulatory Visit (HOSPITAL_COMMUNITY): Payer: Self-pay

## 2015-11-21 ENCOUNTER — Ambulatory Visit (HOSPITAL_COMMUNITY)
Admission: RE | Admit: 2015-11-21 | Discharge: 2015-11-21 | Disposition: A | Discharge status: Home / Self Care | Payer: Medicare Other | Source: Ambulatory Visit | Attending: Internal Medicine | Admitting: Internal Medicine

## 2015-11-21 DIAGNOSIS — M810 Age-related osteoporosis without current pathological fracture: Secondary | ICD-10-CM | POA: Insufficient documentation

## 2015-11-21 MED ORDER — ZOLEDRONIC ACID 5 MG/100ML IV SOLN
5.0000 mg | Freq: Once | INTRAVENOUS | Status: AC
  Administered 2015-11-21: 5 mg via INTRAVENOUS

## 2015-11-21 MED ORDER — ZOLEDRONIC ACID 5 MG/100ML IV SOLN
INTRAVENOUS | Status: DC
Start: 2015-11-21 — End: 2015-11-22
  Filled 2015-11-21: qty 100

## 2015-11-21 NOTE — Discharge Instructions (Signed)

## 2015-11-21 NOTE — Progress Notes (Signed)
Pt tolerated reclast infusion well without complaint, VSS on DC and DC home

## 2015-12-14 DIAGNOSIS — G2581 Restless legs syndrome: Secondary | ICD-10-CM | POA: Diagnosis not present

## 2015-12-14 DIAGNOSIS — R600 Localized edema: Secondary | ICD-10-CM | POA: Diagnosis not present

## 2015-12-14 DIAGNOSIS — R413 Other amnesia: Secondary | ICD-10-CM | POA: Diagnosis not present

## 2015-12-14 DIAGNOSIS — R7309 Other abnormal glucose: Secondary | ICD-10-CM | POA: Diagnosis not present

## 2015-12-14 DIAGNOSIS — R062 Wheezing: Secondary | ICD-10-CM | POA: Diagnosis not present

## 2015-12-14 DIAGNOSIS — F325 Major depressive disorder, single episode, in full remission: Secondary | ICD-10-CM | POA: Diagnosis not present

## 2015-12-14 DIAGNOSIS — Z6841 Body Mass Index (BMI) 40.0 and over, adult: Secondary | ICD-10-CM | POA: Diagnosis not present

## 2015-12-14 DIAGNOSIS — Z7901 Long term (current) use of anticoagulants: Secondary | ICD-10-CM | POA: Diagnosis not present

## 2015-12-14 DIAGNOSIS — D692 Other nonthrombocytopenic purpura: Secondary | ICD-10-CM | POA: Diagnosis not present

## 2015-12-14 DIAGNOSIS — I872 Venous insufficiency (chronic) (peripheral): Secondary | ICD-10-CM | POA: Diagnosis not present

## 2015-12-14 DIAGNOSIS — Z23 Encounter for immunization: Secondary | ICD-10-CM | POA: Diagnosis not present

## 2016-01-03 ENCOUNTER — Ambulatory Visit: Payer: Medicare Other | Admitting: Internal Medicine

## 2016-01-31 DIAGNOSIS — I8393 Asymptomatic varicose veins of bilateral lower extremities: Secondary | ICD-10-CM | POA: Diagnosis not present

## 2016-01-31 DIAGNOSIS — D229 Melanocytic nevi, unspecified: Secondary | ICD-10-CM | POA: Diagnosis not present

## 2016-01-31 DIAGNOSIS — I872 Venous insufficiency (chronic) (peripheral): Secondary | ICD-10-CM | POA: Diagnosis not present

## 2016-01-31 DIAGNOSIS — L281 Prurigo nodularis: Secondary | ICD-10-CM | POA: Diagnosis not present

## 2016-01-31 DIAGNOSIS — L810 Postinflammatory hyperpigmentation: Secondary | ICD-10-CM | POA: Diagnosis not present

## 2016-01-31 DIAGNOSIS — L299 Pruritus, unspecified: Secondary | ICD-10-CM | POA: Diagnosis not present

## 2016-02-11 ENCOUNTER — Ambulatory Visit (INDEPENDENT_AMBULATORY_CARE_PROVIDER_SITE_OTHER): Payer: Medicare Other | Admitting: Internal Medicine

## 2016-02-11 ENCOUNTER — Encounter: Payer: Self-pay | Admitting: Internal Medicine

## 2016-02-11 DIAGNOSIS — J440 Chronic obstructive pulmonary disease with acute lower respiratory infection: Secondary | ICD-10-CM | POA: Diagnosis not present

## 2016-02-11 DIAGNOSIS — J209 Acute bronchitis, unspecified: Secondary | ICD-10-CM

## 2016-02-11 MED ORDER — AZITHROMYCIN 250 MG PO TABS: ORAL_TABLET | ORAL | 0 refills | Status: DC

## 2016-02-11 NOTE — Patient Instructions (Signed)
Script for Z pak sent  Stay well-hydrated, using sips and throat lozenges to soothe throat  Mucinex-DM or Delsym are a good place to start for cough  Please call if needed

## 2016-02-11 NOTE — Progress Notes (Signed)
Patient ID: Diane Barker, female    DOB: 08-20-1950, 65 y.o.   MRN: [id number]  HPI F never smoker followed for bronchitis with hx rhinitis, GERD, hx breast cancer, Hx DVT/ PE/ Xarelto. Office Spirometry 04/30/2015- WNL- FVC 2.75/87%, FEV1 2.29/93%, FEV1/FVC 0.83, FEF 25-75 percent 2.83/126%.   02/06/15-  77 yoF never smoker followed for bronchitis with hx rhinitis, GERD, hx breast cancer, Hx DVT/ PE/ Xarelto.  PCP Dr Virgina Jock Dry cough and shortness of breath and some chest tightness in the last 2 weeks. Not a cold. Continues Xarelto but asks if this is similar to her previous pulmonary embolism. Thinks she may wheeze a little at times but breathing is better supine. ACUTE VISIT: Pt continues to have hacky cough,SOB and wheezing at times. Pt gives out quickly with short distance walks. CXR 06/07/14 IMPRESSION: There is no pneumonia nor other active cardiopulmonary abnormality. Electronically Signed  By: David Martinique  On: 06/07/2014 10:49  04/30/2015-65 year old female never smoker followed for bronchitis, rhinitis, complicated by GERD, history of breast cancer/ XRT, history DVT/PE/Xarelto FOLLOWS FOR: Pt states she continues to have SOB-not as bad as before. Has noticed increased red area on her legs with small open wound like areas. Pt would like to have handicap placard completed as her current one is running out.   Some dyspnea with exertion but no routine wheeze or cough, chest pain or palpitation. Zantac helped cough-discussed role of GERD. Having workup for syncopal episode Office Spirometry 04/30/2015- WNL- FVC 2.75/87%, FEV1 2.29/93%, FEV1/FVC 0.83, FEF 25-75 percent 2.83/126%.  02/11/2016-65 year old female never smoker followed for bronchitis, rhinitis, complicated by GERD, history breast cancer/XRT, history DVT/PE/Xarelto ACUTE VISIT: cough-unsure of color-hard to get anything up at times, sneezing, throat swelling, fever and chills, SOB and wheezing as well x 3-4 days. Scant  sputum is turning dark yellow with hard cough malaise, some chills and low-grade fever. GI okay.  Review of Systems-See HPI Constitutional:   No-   weight loss, night sweats, fevers, chills, fatigue, lassitude. HEENT:   +  headaches, No-difficulty swallowing, tooth/dental problems, +sore throat,        sneezing, itching, ear ache, nasal congestion, +post nasal drip,  CV:  No-   chest pain, orthopnea, PND, swelling in lower extremities, anasarca,  dizziness, palpitations Resp: +shortness of breath with exertion or at rest.              + productive cough,  + non-productive cough,  No- coughing up of blood.            + change in color of mucus.  No- wheezing.   Skin: No-   rash or lesions. GI:  +  heartburn, indigestion, abdominal pain, +nausea, vomiting,  GU: MS:  No-   joint pain or swelling.   Neuro-     nothing unusual Psych:  No- change in mood or affect. No depression or anxiety.  No memory loss.  Objective:  OBJ- Physical Exam General- Alert, Oriented, Affect-appropriate, Distress- none acute, + overweight Skin- rash-none, lesions- none, excoriation- none Lymphadenopathy- none Head- atraumatic            Eyes- Gross vision intact, PERRLA, conjunctivae and secretions clear            Ears- Hearing, canals-normal            Nose- + sniffing, no-Septal dev, mucus, polyps, erosion, perforation             Throat- Mallampati IV , mucosa clear , drainage- none,  tonsils- atrophic Neck- flexible , trachea midline, no stridor , thyroid nl, carotid no bruit Chest - symmetrical excursion , unlabored           Heart/CV- RRR , no murmur , no gallop  , no rub, nl s1 s2                           - JVD- none , edema +1, stasis changes- none, varices- none           Lung- + coarse breath sounds, unlabored, wheeze- none, cough-+  , dullness-none, rub- none           Chest wall-  Abd-  Br/ Gen/ Rectal- Not done, not indicated Extrem- cyanosis- none, clubbing, none, atrophy- none, strength- nl,  + excoriated stasis dermatitis Neuro- grossly intact to observation

## 2016-02-12 NOTE — Assessment & Plan Note (Signed)
Acute infection with exacerbation. Plan-hydration and supportive measures discussed. Z-Pak.

## 2016-02-17 ENCOUNTER — Encounter (HOSPITAL_COMMUNITY): Payer: Self-pay | Admitting: Emergency Medicine

## 2016-02-17 ENCOUNTER — Ambulatory Visit (HOSPITAL_COMMUNITY)
Admission: EM | Admit: 2016-02-17 | Discharge: 2016-02-17 | Disposition: A | Discharge status: Home / Self Care | Payer: Medicare Other | Attending: Emergency Medicine | Admitting: Emergency Medicine

## 2016-02-17 DIAGNOSIS — J069 Acute upper respiratory infection, unspecified: Secondary | ICD-10-CM | POA: Diagnosis not present

## 2016-02-17 DIAGNOSIS — J9801 Acute bronchospasm: Secondary | ICD-10-CM | POA: Diagnosis not present

## 2016-02-17 DIAGNOSIS — R0982 Postnasal drip: Secondary | ICD-10-CM

## 2016-02-17 MED ORDER — IPRATROPIUM-ALBUTEROL 0.5-2.5 (3) MG/3ML IN SOLN
RESPIRATORY_TRACT | Status: AC
  Filled 2016-02-17: qty 3

## 2016-02-17 MED ORDER — PREDNISONE 50 MG PO TABS
ORAL_TABLET | ORAL | 0 refills | Status: DC
Start: 2016-02-17 — End: 2016-09-07

## 2016-02-17 MED ORDER — ALBUTEROL SULFATE HFA 108 (90 BASE) MCG/ACT IN AERS: 2.0000 | INHALATION_SPRAY | RESPIRATORY_TRACT | 0 refills | Status: DC | PRN

## 2016-02-17 MED ORDER — PHENYLEPHRINE-CHLORPHEN-DM 10-4-12.5 MG/5ML PO LIQD
5.0000 mL | ORAL | 0 refills | Status: DC | PRN
Start: 2016-02-17 — End: 2016-09-07

## 2016-02-17 MED ORDER — IPRATROPIUM-ALBUTEROL 0.5-2.5 (3) MG/3ML IN SOLN
3.0000 mL | Freq: Once | RESPIRATORY_TRACT | Status: AC
  Administered 2016-02-17: 3 mL via RESPIRATORY_TRACT

## 2016-02-17 NOTE — ED Provider Notes (Signed)
CSN: [id number]     Arrival date & time 02/17/16  1630 History   First MD Initiated Contact with Patient 02/17/16 1837     Chief Complaint  Patient presents with  . Cough   (Consider location/radiation/quality/duration/timing/severity/associated sxs/prior Treatment) 65 year old female with history of arthritis, breast CVA and rhonchi is presents to the urgent care complaining of a constant cough for 2 weeks. She saw her pulmonologist 1 week ago and was told that she did not really have any wheezing and was treated with a Z-Pak. She has had 0 improvement and continues to have a coarse type cough. Denies fevers or shortness of breath. She does complain of a copious amount of PND which seems to be worsened night and increasing the force and frequency of cough.      Past Medical History:  Diagnosis Date  . Arthritis   . Breast cancer ([place]) 2000   Rt lumpectomy, Chemo/XRT/rt axillary node   . Depression   . Diverticulosis   . GERD (gastroesophageal reflux disease)   . Hx of adenomatous colonic polyps   . Low back pain   . Obesity    Past Surgical History:  Procedure Laterality Date  . BREAST LUMPECTOMY     for CA txed w/ chemo and radiation, right  . TUBAL LIGATION    . UMBILICAL HERNIA REPAIR  1983   Family History  Problem Relation Age of Onset  . Asthma Mother   . COPD Mother   . Heart attack Mother   . Diabetes Mother   . Lung cancer Father   . Asthma Daughter   . Kidney failure Brother   . Diabetes Brother     x 2  . Heart attack Brother   . Breast cancer Maternal Aunt   . Colon cancer Paternal Aunt   . Diabetes Daughter     x 2  . Hypertension Brother    Social History  Substance Use Topics  . Smoking status: Never Smoker  . Smokeless tobacco: Never Used  . Alcohol use No   OB History    No data available     Review of Systems  Constitutional: Positive for fatigue. Negative for activity change, appetite change, chills and fever.  HENT: Positive for  congestion, postnasal drip, rhinorrhea and sore throat. Negative for facial swelling.   Eyes: Negative.   Respiratory: Positive for cough. Negative for shortness of breath.   Cardiovascular: Negative.  Negative for chest pain.  Musculoskeletal: Negative for neck pain and neck stiffness.  Skin: Negative for pallor and rash.  Neurological: Negative.   All other systems reviewed and are negative.   Allergies  Erythromycin; Morphine; Penicillins; and Relafen [nabumetone]  Home Medications   Prior to Admission medications   Medication Sig Start Date End Date Taking? Authorizing Provider  azithromycin (ZITHROMAX) 250 MG tablet 2 today then one daily 02/11/16  Yes Deneise Lever, MD  albuterol (PROVENTIL HFA;VENTOLIN HFA) 108 (90 Base) MCG/ACT inhaler Inhale 2 puffs into the lungs every 4 (four) hours as needed for wheezing or shortness of breath. 02/17/16   Janne Napoleon, NP  calcium carbonate (OS-CAL - DOSED IN MG OF ELEMENTAL CALCIUM) 1250 (500 Ca) MG tablet Take 600 mg by mouth.    Historical Provider, MD  Cholecalciferol (VITAMIN D) 2000 UNITS CAPS Take 1 capsule by mouth daily.    Historical Provider, MD  dexlansoprazole (DEXILANT) 60 MG capsule Take 60 mg by mouth daily.    Historical Provider, MD  escitalopram (LEXAPRO) 10  MG tablet Take 10 mg by mouth.    Historical Provider, MD  Phenylephrine-Chlorphen-DM 12-19-10.5 MG/5ML LIQD Take 5 mLs by mouth every 4 (four) hours as needed. May cause drowsiness. 02/17/16   Janne Napoleon, NP  predniSONE (DELTASONE) 50 MG tablet 1 tab po daily for 6 days. Take with food. 02/17/16   Janne Napoleon, NP  ranitidine (ZANTAC) 150 MG tablet Take 150 mg by mouth at bedtime.    Historical Provider, MD  triamcinolone ointment (KENALOG) 0.1 % Apply small amt to reddened areas of lower legs 1-2 times daily UTD ( do not put on opened sores) 04/10/15   Historical Provider, MD  Bliss topically. TRIAMCINOLONE ACETONIDE (TRIAMCINOLONE 0.1%-SILVER SULFADIAZINE 1%  3:1 11/07/15   Historical Provider, MD  XARELTO 20 MG TABS tablet Take 1 tablet by mouth daily. 04/10/14   Historical Provider, MD   Meds Ordered and Administered this Visit   Medications  ipratropium-albuterol (DUONEB) 0.5-2.5 (3) MG/3ML nebulizer solution 3 mL (not administered)    BP 133/62 (BP Location: Left Arm) Comment (BP Location): large cuff  Pulse 65   Temp 98.6 F (37 C) (Oral)   Resp 18   SpO2 100%  No data found.   Physical Exam  Constitutional: She is oriented to person, place, and time. She appears well-developed and well-nourished. No distress.  HENT:  Head: Normocephalic and atraumatic.  Mouth/Throat: No oropharyngeal exudate.  Bilateral TMs are obscured by cerumen in the EACs. Oropharynx with copious amount of thick clear to slightly gray PND.   Eyes: EOM are normal.  Neck: Normal range of motion. Neck supple.  Cardiovascular: Normal rate, regular rhythm and normal heart sounds.   Pulmonary/Chest: Effort normal. No respiratory distress.  With tidal volume no wheezing or other adventitious sounds also breath sounds on expiration are quite distant. Having the patient forcefully take a deep breath and blowout forcefully produces some coarseness and cough increases coarseness bilaterally and diffusely.  Musculoskeletal: Normal range of motion. She exhibits no edema.  Lymphadenopathy:    She has no cervical adenopathy.  Neurological: She is alert and oriented to person, place, and time.  Skin: Skin is warm and dry. No rash noted.  Psychiatric: She has a normal mood and affect.  Nursing note and vitals reviewed.   Urgent Care Course   Clinical Course     Procedures (including critical care time)  Labs Review Labs Reviewed - No data to display  Imaging Review No results found.   Visual Acuity Review  Right Eye Distance:   Left Eye Distance:   Bilateral Distance:    Right Eye Near:   Left Eye Near:    Bilateral Near:         MDM   1. Cough  due to bronchospasm   2. PND (post-nasal drip)   3. Upper respiratory tract infection, unspecified type   Post DuoNeb patient states she is breathing much better. Indeed she is moving air better and the expiratory phase is much shorter. Only minor coarseness remains. Your cough is due to the combination of bronchospasm which is similar to wheezing and a large amount of drainage in the back of your throat. You are receiving an albuterol inhaler with prednisone to take and use for the wheezing which should help with your cough substantially. You are also given a prescription for a liquid medicine to take for cough and drainage. This may cause drowsiness and only take the amount as directed. Be sure to drink plenty of  fluids and stay well-hydrated. If you develop fevers or difficulty in breathing or shortness of breath he may need to go to the emergency department. Meds ordered this encounter  Medications  . ipratropium-albuterol (DUONEB) 0.5-2.5 (3) MG/3ML nebulizer solution 3 mL  . albuterol (PROVENTIL HFA;VENTOLIN HFA) 108 (90 Base) MCG/ACT inhaler    Sig: Inhale 2 puffs into the lungs every 4 (four) hours as needed for wheezing or shortness of breath.    Dispense:  1 Inhaler    Refill:  0    Order Specific Question:   Supervising Provider    Answer:   Melony Overly [id number]  . predniSONE (DELTASONE) 50 MG tablet    Sig: 1 tab po daily for 6 days. Take with food.    Dispense:  6 tablet    Refill:  0    Order Specific Question:   Supervising Provider    Answer:   Melony Overly [id number]  . Phenylephrine-Chlorphen-DM 12-19-10.5 MG/5ML LIQD    Sig: Take 5 mLs by mouth every 4 (four) hours as needed. May cause drowsiness.    Dispense:  120 mL    Refill:  0    Order Specific Question:   Supervising Provider    Answer:   Melony Overly [id number]       Janne Napoleon, NP 02/17/16 1927

## 2016-02-17 NOTE — Discharge Instructions (Signed)
Your cough is due to the combination of bronchospasm which is similar to wheezing and a large amount of drainage in the back of your throat. You are receiving an albuterol inhaler with prednisone to take and use for the wheezing which should help with your cough substantially. You are also given a prescription for a liquid medicine to take for cough and drainage. This may cause drowsiness and only take the amount as directed. Be sure to drink plenty of fluids and stay well-hydrated. If you develop fevers or difficulty in breathing or shortness of breath he may need to go to the emergency department.

## 2016-02-17 NOTE — ED Triage Notes (Signed)
Patient reports having sneezing, sore throat, cough and wheezing.  Symptoms for 2 weeks.  Patient was seen by [facility] 11/27.  Patient was treated with zpac.  Patient has finished medication and not feeling any better.  Patient has no energy, feels weak.

## 2016-02-17 NOTE — ED Notes (Signed)
Breathing treatment in progress

## 2016-02-20 ENCOUNTER — Telehealth (HOSPITAL_COMMUNITY): Payer: Self-pay | Admitting: Emergency Medicine

## 2016-02-20 MED ORDER — BENZONATATE 100 MG PO CAPS: 100.0000 mg | ORAL_CAPSULE | Freq: Three times a day (TID) | ORAL | 0 refills | Status: DC

## 2016-02-20 NOTE — Telephone Encounter (Signed)
Pt unable to fill liquid cough medicine.  Rx sent for tessalon pearles.

## 2016-03-06 DIAGNOSIS — J310 Chronic rhinitis: Secondary | ICD-10-CM | POA: Diagnosis not present

## 2016-03-06 DIAGNOSIS — K219 Gastro-esophageal reflux disease without esophagitis: Secondary | ICD-10-CM | POA: Diagnosis not present

## 2016-03-06 DIAGNOSIS — Z6839 Body mass index (BMI) 39.0-39.9, adult: Secondary | ICD-10-CM | POA: Diagnosis not present

## 2016-03-06 DIAGNOSIS — R05 Cough: Secondary | ICD-10-CM | POA: Diagnosis not present

## 2016-04-07 ENCOUNTER — Ambulatory Visit (INDEPENDENT_AMBULATORY_CARE_PROVIDER_SITE_OTHER)
Admission: RE | Admit: 2016-04-07 | Discharge: 2016-04-07 | Disposition: A | Discharge status: Home / Self Care | Payer: 59 | Source: Ambulatory Visit | Attending: Internal Medicine | Admitting: Internal Medicine

## 2016-04-07 ENCOUNTER — Other Ambulatory Visit: Payer: Self-pay | Admitting: Internal Medicine

## 2016-04-07 DIAGNOSIS — R05 Cough: Secondary | ICD-10-CM | POA: Diagnosis not present

## 2016-04-07 DIAGNOSIS — R911 Solitary pulmonary nodule: Secondary | ICD-10-CM

## 2016-04-07 DIAGNOSIS — J42 Unspecified chronic bronchitis: Secondary | ICD-10-CM | POA: Diagnosis not present

## 2016-04-07 DIAGNOSIS — R0602 Shortness of breath: Secondary | ICD-10-CM | POA: Diagnosis not present

## 2016-04-07 DIAGNOSIS — J209 Acute bronchitis, unspecified: Secondary | ICD-10-CM

## 2016-05-23 DIAGNOSIS — R7309 Other abnormal glucose: Secondary | ICD-10-CM | POA: Diagnosis not present

## 2016-05-23 DIAGNOSIS — D519 Vitamin B12 deficiency anemia, unspecified: Secondary | ICD-10-CM | POA: Diagnosis not present

## 2016-05-23 DIAGNOSIS — E559 Vitamin D deficiency, unspecified: Secondary | ICD-10-CM | POA: Diagnosis not present

## 2016-05-23 DIAGNOSIS — R946 Abnormal results of thyroid function studies: Secondary | ICD-10-CM | POA: Diagnosis not present

## 2016-05-23 DIAGNOSIS — E784 Other hyperlipidemia: Secondary | ICD-10-CM | POA: Diagnosis not present

## 2016-07-11 ENCOUNTER — Ambulatory Visit (INDEPENDENT_AMBULATORY_CARE_PROVIDER_SITE_OTHER)
Admission: RE | Admit: 2016-07-11 | Discharge: 2016-07-11 | Disposition: A | Discharge status: Home / Self Care | Payer: 59 | Source: Ambulatory Visit | Attending: Internal Medicine | Admitting: Internal Medicine

## 2016-07-11 DIAGNOSIS — J209 Acute bronchitis, unspecified: Secondary | ICD-10-CM | POA: Diagnosis not present

## 2016-07-11 DIAGNOSIS — R911 Solitary pulmonary nodule: Secondary | ICD-10-CM | POA: Diagnosis not present

## 2016-09-07 ENCOUNTER — Ambulatory Visit (HOSPITAL_COMMUNITY)
Admission: EM | Admit: 2016-09-07 | Discharge: 2016-09-07 | Disposition: A | Discharge status: Home / Self Care | Payer: 59 | Attending: Family Medicine | Admitting: Family Medicine

## 2016-09-07 ENCOUNTER — Encounter (HOSPITAL_COMMUNITY): Payer: Self-pay | Admitting: Emergency Medicine

## 2016-09-07 DIAGNOSIS — H6123 Impacted cerumen, bilateral: Secondary | ICD-10-CM | POA: Diagnosis not present

## 2016-09-07 NOTE — ED Provider Notes (Signed)
[facility]    CSN: [id number] Arrival date & time: 09/07/16  1845     History   Chief Complaint Chief Complaint  Patient presents with  . Otalgia    HPI Diane Barker is a 67 y.o. female.   Pt reports ear fullness and pain bilaterally, loss of hearing and dizziness for the last two days.      Past Medical History:  Diagnosis Date  . Arthritis   . Breast cancer ([place]) 2000   Rt lumpectomy, Chemo/XRT/rt axillary node   . Depression   . Diverticulosis   . GERD (gastroesophageal reflux disease)   . Hx of adenomatous colonic polyps   . Low back pain   . Obesity     Patient Active Problem List   Diagnosis Date Noted  . Dyspnea on exertion 05/24/2015  . DVT, lower extremity ([place]) 05/09/2014  . Right calf pain 06/21/2013  . Pulmonary embolism ([place]) 06/21/2013  . Dysphagia 06/27/2011  . Cough 06/27/2011  . CHEST PAIN 01/18/2009  . RHINITIS 04/13/2007  . Obstructive chronic bronchitis with acute bronchitis ([place]) 04/13/2007  . GERD 04/13/2007  . DEPRESSION 01/21/2007  . OSTEOARTHRITIS 01/21/2007  . LOW BACK PAIN 01/21/2007  . BREAST CANCER, HX OF 01/21/2007    Past Surgical History:  Procedure Laterality Date  . BREAST LUMPECTOMY     for CA txed w/ chemo and radiation, right  . TUBAL LIGATION    . UMBILICAL HERNIA REPAIR  1983    OB History    No data available       Home Medications    Prior to Admission medications   Medication Sig Start Date End Date Taking? Authorizing Provider  calcium carbonate (OS-CAL - DOSED IN MG OF ELEMENTAL CALCIUM) 1250 (500 Ca) MG tablet Take 600 mg by mouth.   Yes [provider]  Cholecalciferol (VITAMIN D) 2000 UNITS CAPS Take 1 capsule by mouth daily.   Yes [provider]  dexlansoprazole (DEXILANT) 60 MG capsule Take 60 mg by mouth daily.   Yes [provider]  escitalopram (LEXAPRO) 10 MG tablet Take 10 mg by mouth.   Yes [provider]  ranitidine (ZANTAC) 150 MG  tablet Take 150 mg by mouth at bedtime.   Yes [provider]  XARELTO 20 MG TABS tablet Take 1 tablet by mouth daily. 04/10/14  Yes [provider]  triamcinolone ointment (KENALOG) 0.1 % Apply small amt to reddened areas of lower legs 1-2 times daily UTD ( do not put on opened sores) 04/10/15   [provider]  UNABLE TO FIND Apply topically. TRIAMCINOLONE ACETONIDE (TRIAMCINOLONE 0.1%-SILVER SULFADIAZINE 1% 3:1 11/07/15   [provider]    Family History Family History  Problem Relation Age of Onset  . Asthma Mother   . COPD Mother   . Heart attack Mother   . Diabetes Mother   . Lung cancer Father   . Asthma Daughter   . Kidney failure Brother   . Diabetes Brother        x 2  . Heart attack Brother   . Breast cancer Maternal Aunt   . Colon cancer Paternal Aunt   . Diabetes Daughter        x 2  . Hypertension Brother     Social History Social History  Substance Use Topics  . Smoking status: Never Smoker  . Smokeless tobacco: Never Used  . Alcohol use No     Allergies   Erythromycin; Morphine;  Penicillins; and Relafen [nabumetone]   Review of Systems Review of Systems   Physical Exam Triage Vital Signs ED Triage Vitals  Enc Vitals Group     BP 09/07/16 1925 137/73     Pulse Rate 09/07/16 1925 67     Resp --      Temp 09/07/16 1925 98.3 F (36.8 C)     Temp Source 09/07/16 1925 Oral     SpO2 09/07/16 1925 97 %     Weight --      Height --      Head Circumference --      Peak Flow --      Pain Score 09/07/16 1922 0     Pain Loc --      Pain Edu? --      Excl. in [place]? --    No data found.   Updated Vital Signs BP 137/73 (BP Location: Left Arm)   Pulse 67   Temp 98.3 F (36.8 C) (Oral)   SpO2 97%    Physical Exam  Constitutional: She appears well-developed and well-nourished.  Nursing note and vitals reviewed.    UC Treatments / Results  Labs (all labs ordered are listed, but only abnormal results are  displayed) Labs Reviewed - No data to display  EKG  EKG Interpretation None       Radiology No results found.  Procedures Procedures (including critical care time)  Medications Ordered in UC Medications - No data to display   Initial Impression / Assessment and Plan / UC Course  I have reviewed the triage vital signs and the nursing notes.  Pertinent labs & imaging results that were available during my care of the patient were reviewed by me and considered in my medical decision making (see chart for details).   improved hearing after ear irrigation, though somewhat uncomfortable in right ear.  No bleeding or swelling noted.  TM's appear normal  Final Clinical Impressions(s) / UC Diagnoses   Final diagnoses:  Bilateral impacted cerumen    New Prescriptions Current Discharge Medication List       Robyn Haber, MD 09/07/16 2007

## 2016-09-07 NOTE — ED Triage Notes (Signed)
Pt reports ear fullness and pain bilaterally, loss of hearing and dizziness for the last two days.

## 2016-10-07 ENCOUNTER — Observation Stay (HOSPITAL_COMMUNITY)
Admission: EM | Admit: 2016-10-07 | Discharge: 2016-10-09 | Disposition: A | Discharge status: Home / Self Care | Payer: Medicare Other | Attending: Internal Medicine | Admitting: Internal Medicine

## 2016-10-07 ENCOUNTER — Emergency Department (HOSPITAL_COMMUNITY): Payer: Medicare Other

## 2016-10-07 ENCOUNTER — Encounter (HOSPITAL_COMMUNITY): Payer: Self-pay | Admitting: *Deleted

## 2016-10-07 DIAGNOSIS — Z853 Personal history of malignant neoplasm of breast: Secondary | ICD-10-CM | POA: Insufficient documentation

## 2016-10-07 DIAGNOSIS — I824Z1 Acute embolism and thrombosis of unspecified deep veins of right distal lower extremity: Secondary | ICD-10-CM | POA: Diagnosis not present

## 2016-10-07 DIAGNOSIS — F329 Major depressive disorder, single episode, unspecified: Secondary | ICD-10-CM | POA: Diagnosis not present

## 2016-10-07 DIAGNOSIS — L03113 Cellulitis of right upper limb: Secondary | ICD-10-CM | POA: Diagnosis not present

## 2016-10-07 DIAGNOSIS — Z9221 Personal history of antineoplastic chemotherapy: Secondary | ICD-10-CM | POA: Diagnosis not present

## 2016-10-07 DIAGNOSIS — R079 Chest pain, unspecified: Secondary | ICD-10-CM | POA: Diagnosis present

## 2016-10-07 DIAGNOSIS — I700 Atherosclerosis of aorta: Secondary | ICD-10-CM | POA: Diagnosis not present

## 2016-10-07 DIAGNOSIS — Z86711 Personal history of pulmonary embolism: Secondary | ICD-10-CM | POA: Diagnosis not present

## 2016-10-07 DIAGNOSIS — Z923 Personal history of irradiation: Secondary | ICD-10-CM | POA: Insufficient documentation

## 2016-10-07 DIAGNOSIS — K219 Gastro-esophageal reflux disease without esophagitis: Secondary | ICD-10-CM | POA: Insufficient documentation

## 2016-10-07 DIAGNOSIS — I82409 Acute embolism and thrombosis of unspecified deep veins of unspecified lower extremity: Secondary | ICD-10-CM | POA: Diagnosis present

## 2016-10-07 DIAGNOSIS — I4510 Unspecified right bundle-branch block: Secondary | ICD-10-CM | POA: Insufficient documentation

## 2016-10-07 DIAGNOSIS — Z7901 Long term (current) use of anticoagulants: Secondary | ICD-10-CM | POA: Diagnosis not present

## 2016-10-07 DIAGNOSIS — I2699 Other pulmonary embolism without acute cor pulmonale: Secondary | ICD-10-CM | POA: Diagnosis not present

## 2016-10-07 DIAGNOSIS — Z86718 Personal history of other venous thrombosis and embolism: Secondary | ICD-10-CM | POA: Insufficient documentation

## 2016-10-07 DIAGNOSIS — Z79899 Other long term (current) drug therapy: Secondary | ICD-10-CM | POA: Insufficient documentation

## 2016-10-07 DIAGNOSIS — A419 Sepsis, unspecified organism: Secondary | ICD-10-CM | POA: Diagnosis not present

## 2016-10-07 DIAGNOSIS — F32A Depression, unspecified: Secondary | ICD-10-CM | POA: Diagnosis present

## 2016-10-07 DIAGNOSIS — R0602 Shortness of breath: Secondary | ICD-10-CM | POA: Diagnosis present

## 2016-10-07 LAB — URINALYSIS, ROUTINE W REFLEX MICROSCOPIC
Bilirubin Urine: NEGATIVE
GLUCOSE, UA: NEGATIVE mg/dL
HGB URINE DIPSTICK: NEGATIVE
Ketones, ur: NEGATIVE mg/dL
Leukocytes, UA: NEGATIVE
Nitrite: NEGATIVE
PH: 6 (ref 5.0–8.0)
Protein, ur: NEGATIVE mg/dL
SPECIFIC GRAVITY, URINE: 1.025 (ref 1.005–1.030)

## 2016-10-07 LAB — CBC WITH DIFFERENTIAL/PLATELET
Basophils Absolute: 0.1 10*3/uL (ref 0.0–0.1)
Basophils Relative: 0 %
EOS PCT: 0 %
Eosinophils Absolute: 0 10*3/uL (ref 0.0–0.7)
HEMATOCRIT: 39.6 % (ref 36.0–46.0)
Hemoglobin: 13.4 g/dL (ref 12.0–15.0)
LYMPHS ABS: 1.2 10*3/uL (ref 0.7–4.0)
LYMPHS PCT: 10 %
MCH: 32.4 pg (ref 26.0–34.0)
MCHC: 33.8 g/dL (ref 30.0–36.0)
MCV: 95.9 fL (ref 78.0–100.0)
MONO ABS: 1 10*3/uL (ref 0.1–1.0)
MONOS PCT: 8 %
NEUTROS ABS: 10 10*3/uL — AB (ref 1.7–7.7)
Neutrophils Relative %: 82 %
Platelets: 218 10*3/uL (ref 150–400)
RBC: 4.13 MIL/uL (ref 3.87–5.11)
RDW: 12.7 % (ref 11.5–15.5)
WBC: 12.3 10*3/uL — ABNORMAL HIGH (ref 4.0–10.5)

## 2016-10-07 LAB — SEDIMENTATION RATE: SED RATE: 34 mm/h — AB (ref 0–22)

## 2016-10-07 LAB — TROPONIN I: Troponin I: <0.03 ng/mL (ref <0.03)

## 2016-10-07 LAB — PROCALCITONIN: PROCALCITONIN: 0.36 ng/mL

## 2016-10-07 LAB — COMPREHENSIVE METABOLIC PANEL
ALBUMIN: 3.5 g/dL (ref 3.5–5.0)
ALT: 12 U/L — AB (ref 14–54)
AST: 16 U/L (ref 15–41)
Alkaline Phosphatase: 67 U/L (ref 38–126)
Anion gap: 7 (ref 5–15)
BUN: 13 mg/dL (ref 6–20)
CHLORIDE: 105 mmol/L (ref 101–111)
CO2: 24 mmol/L (ref 22–32)
Calcium: 8.8 mg/dL — ABNORMAL LOW (ref 8.9–10.3)
Creatinine, Ser: 1.04 mg/dL — ABNORMAL HIGH (ref 0.44–1.00)
GFR calc Af Amer: >60 mL/min (ref >60)
GFR calc non Af Amer: 54 mL/min — ABNORMAL LOW (ref >60)
GLUCOSE: 105 mg/dL — AB (ref 65–99)
POTASSIUM: 3.9 mmol/L (ref 3.5–5.1)
Sodium: 136 mmol/L (ref 135–145)
Total Bilirubin: 1.1 mg/dL (ref 0.3–1.2)
Total Protein: 7 g/dL (ref 6.5–8.1)

## 2016-10-07 LAB — I-STAT TROPONIN, ED: TROPONIN I, POC: 0.01 ng/mL (ref 0.00–0.08)

## 2016-10-07 LAB — LACTIC ACID, PLASMA: Lactic Acid, Venous: 1.5 mmol/L (ref 0.5–1.9)

## 2016-10-07 LAB — PROTIME-INR
INR: 1.26
Prothrombin Time: 15.9 seconds — ABNORMAL HIGH (ref 11.4–15.2)

## 2016-10-07 LAB — I-STAT CG4 LACTIC ACID, ED: Lactic Acid, Venous: 1.53 mmol/L (ref 0.5–1.9)

## 2016-10-07 LAB — C-REACTIVE PROTEIN: CRP: 5.1 mg/dL — ABNORMAL HIGH (ref <1.0)

## 2016-10-07 MED ORDER — PANTOPRAZOLE SODIUM 40 MG PO TBEC
40.0000 mg | DELAYED_RELEASE_TABLET | Freq: Every day | ORAL | Status: DC
  Administered 2016-10-07 – 2016-10-09 (×3): 40 mg via ORAL
  Filled 2016-10-07 (×3): qty 1

## 2016-10-07 MED ORDER — SODIUM CHLORIDE 0.9 % IV SOLN
INTRAVENOUS | Status: DC
  Administered 2016-10-08 (×3): via INTRAVENOUS

## 2016-10-07 MED ORDER — CALCIUM CARBONATE 1250 (500 CA) MG PO TABS
1250.0000 mg | ORAL_TABLET | Freq: Every day | ORAL | Status: DC
  Administered 2016-10-08 – 2016-10-09 (×2): 1250 mg via ORAL
  Filled 2016-10-07 (×2): qty 1

## 2016-10-07 MED ORDER — ACETAMINOPHEN 325 MG PO TABS
650.0000 mg | ORAL_TABLET | Freq: Once | ORAL | Status: AC
  Administered 2016-10-07: 650 mg via ORAL

## 2016-10-07 MED ORDER — SODIUM CHLORIDE 0.9 % IV BOLUS (SEPSIS)
2500.0000 mL | Freq: Once | INTRAVENOUS | Status: AC
  Administered 2016-10-07: 2500 mL via INTRAVENOUS

## 2016-10-07 MED ORDER — ACETAMINOPHEN 650 MG RE SUPP
650.0000 mg | Freq: Four times a day (QID) | RECTAL | Status: DC | PRN
Start: 2016-10-07 — End: 2016-10-09

## 2016-10-07 MED ORDER — VITAMIN B-12 100 MCG PO TABS
500.0000 ug | ORAL_TABLET | Freq: Every day | ORAL | Status: DC
  Administered 2016-10-08 – 2016-10-09 (×2): 500 ug via ORAL
  Filled 2016-10-07 (×2): qty 5

## 2016-10-07 MED ORDER — FAMOTIDINE 20 MG PO TABS
20.0000 mg | ORAL_TABLET | Freq: Every day | ORAL | Status: DC
  Administered 2016-10-07 – 2016-10-08 (×2): 20 mg via ORAL
  Filled 2016-10-07 (×2): qty 1

## 2016-10-07 MED ORDER — ONDANSETRON HCL 4 MG/2ML IJ SOLN: 4.0000 mg | Freq: Four times a day (QID) | INTRAMUSCULAR | Status: DC | PRN

## 2016-10-07 MED ORDER — TRIAMCINOLONE ACETONIDE 0.1 % EX OINT
1.0000 "application " | TOPICAL_OINTMENT | Freq: Two times a day (BID) | CUTANEOUS | Status: DC | PRN
  Filled 2016-10-07: qty 15

## 2016-10-07 MED ORDER — RIVAROXABAN 20 MG PO TABS
20.0000 mg | ORAL_TABLET | Freq: Every day | ORAL | Status: DC
  Administered 2016-10-08: 20 mg via ORAL
  Filled 2016-10-07: qty 1

## 2016-10-07 MED ORDER — ESCITALOPRAM OXALATE 10 MG PO TABS
10.0000 mg | ORAL_TABLET | Freq: Every day | ORAL | Status: DC
  Administered 2016-10-07 – 2016-10-09 (×3): 10 mg via ORAL
  Filled 2016-10-07 (×3): qty 1

## 2016-10-07 MED ORDER — CEFAZOLIN SODIUM-DEXTROSE 1-4 GM/50ML-% IV SOLN
1.0000 g | Freq: Three times a day (TID) | INTRAVENOUS | Status: DC
  Administered 2016-10-08 – 2016-10-09 (×5): 1 g via INTRAVENOUS
  Filled 2016-10-07 (×6): qty 50

## 2016-10-07 MED ORDER — ACETAMINOPHEN 325 MG PO TABS
650.0000 mg | ORAL_TABLET | Freq: Four times a day (QID) | ORAL | Status: DC | PRN
  Administered 2016-10-08: 650 mg via ORAL
  Filled 2016-10-07: qty 2

## 2016-10-07 MED ORDER — ZOLPIDEM TARTRATE 5 MG PO TABS: 5.0000 mg | ORAL_TABLET | Freq: Every evening | ORAL | Status: DC | PRN

## 2016-10-07 MED ORDER — CEFAZOLIN SODIUM-DEXTROSE 1-4 GM/50ML-% IV SOLN
1.0000 g | Freq: Once | INTRAVENOUS | Status: AC
  Administered 2016-10-07: 1 g via INTRAVENOUS
  Filled 2016-10-07: qty 50

## 2016-10-07 MED ORDER — ONDANSETRON HCL 4 MG PO TABS: 4.0000 mg | ORAL_TABLET | Freq: Four times a day (QID) | ORAL | Status: DC | PRN

## 2016-10-07 MED ORDER — ACETAMINOPHEN 325 MG PO TABS
ORAL_TABLET | ORAL | Status: AC
Start: 2016-10-07 — End: 2016-10-08
  Filled 2016-10-07: qty 2

## 2016-10-07 MED ORDER — NITROGLYCERIN 0.4 MG SL SUBL: 0.4000 mg | SUBLINGUAL_TABLET | SUBLINGUAL | Status: DC | PRN

## 2016-10-07 MED ORDER — VITAMIN D 1000 UNITS PO TABS
2000.0000 [IU] | ORAL_TABLET | Freq: Every day | ORAL | Status: DC
  Administered 2016-10-07 – 2016-10-09 (×3): 2000 [IU] via ORAL
  Filled 2016-10-07 (×3): qty 2

## 2016-10-07 NOTE — ED Provider Notes (Signed)
Complains of shaking chills onset this morning. She also reports a reddened swollen area on her right arm which has been there for 3 days. This morning she had anterior chest pain described as pressure which radiated to left shoulder, shortness of breath which lasted 5 minutes, single episode and resolved. She does note dyspnea on exertion for approximately the past week. No other associated symptoms   Diane Dakin, MD 10/08/16 [phone]

## 2016-10-07 NOTE — ED Triage Notes (Addendum)
Pt c/o chills, mid radiating CP to L arm, & SOB intermittent for 2-3 days, pt denies v/d, pt denies current CP, reports lack of appetite & nausea, A&O x4, pt has reddened area & wound to R upper arm

## 2016-10-07 NOTE — H&P (Signed)
History and Physical    Diane Barker:[id number] DOB: Mar 02, 1951 DOA: 10/07/2016  Referring MD/NP/PA:   PCP: Shon Baton, MD   Patient coming from:  The patient is coming from home.  At baseline, pt is independent for most of ADL.   Chief Complaint: right upper arm redness and swelling, fever and chills  HPI: Diane Barker is a 66 y.o. female with medical history significant of prostate cancer (s/p of lumpectomy, radiation and chemotherapy), DVT and PE on Xarelto, depression, GERD, chronic back pain, arthritis, obesity, who presents with right upper arm redness and swelling, fever and chills.  Pt states that she has been having multiple small sores to her bilateral arms and legs. She says she has been told she has poor blood flow in her legs that causes changes in skin color and wounds to be present on her legs. She states that she placed a Band-Aid in a small sore lesion in her right upper arm, when she took the bandage off on Saturday, it pulled her skin, then it becomes swelling, red and she felt like there was a "knot" under it. No significant pain. She states that she had a lymph node dissection on the right side for breast cancer in the past.   Pt states that she has fever, chills and shaking today. She had one episode of chest pain, which is located in the substernal area, moderate, radiating to left arm, lasted for about 3-5 minutes, then resolved spontaneously. Currently patient does not have chest pain, shortness breath, cough. Patient does not have nausea, vomiting, diarrhea, abdominal pain, symptoms of UTI or unilateral weakness.  ED Course: pt was found to have WBC 12.3, lactic acid 1.53, INR 1.26, negative troponin, negative urinalysis, creatinine 1.04, temperature 100.9, tachycardia, oxygen saturation 95% on room air. Chest x-ray showed bronchitis take change without infiltration. Patient is placed on telemetry bed for observation.  Review of Systems:   General: has fevers,  chills, no changes in body weight, has fatigue HEENT: no blurry vision, hearing changes or sore throat Respiratory: no dyspnea, coughing, wheezing CV: had chest pain, no palpitations GI: no nausea, vomiting, abdominal pain, diarrhea, constipation GU: no dysuria, burning on urination, increased urinary frequency, hematuria  Ext: no leg edema Neuro: no unilateral weakness, numbness, or tingling, no vision change or hearing loss Skin: has multiple small scores in arms and legs.  MSK: No muscle spasm, no deformity, no limitation of range of movement in spin Heme: No easy bruising.  Travel history: No recent long distant travel.  Allergy:  Allergies  Allergen Reactions  . Erythromycin Other (See Comments)    REACTION: stomach cramps  . Morphine Other (See Comments)    REACTION: Hallucinations  . Penicillins Other (See Comments)    REACTION: unkkown, was a child  . Relafen [Nabumetone] Nausea Only    Past Medical History:  Diagnosis Date  . Arthritis   . Breast cancer ([place]) 2000   Rt lumpectomy, Chemo/XRT/rt axillary node   . Depression   . Diverticulosis   . GERD (gastroesophageal reflux disease)   . Hx of adenomatous colonic polyps   . Low back pain   . Obesity     Past Surgical History:  Procedure Laterality Date  . BREAST LUMPECTOMY     for CA txed w/ chemo and radiation, right  . TUBAL LIGATION    . UMBILICAL HERNIA REPAIR  1983    Social History:  reports that she has never smoked. She has never  used smokeless tobacco. She reports that she does not drink alcohol or use drugs.  Family History:  Family History  Problem Relation Age of Onset  . Asthma Mother   . COPD Mother   . Heart attack Mother   . Diabetes Mother   . Lung cancer Father   . Asthma Daughter   . Kidney failure Brother   . Diabetes Brother        x 2  . Heart attack Brother   . Breast cancer Maternal Aunt   . Colon cancer Paternal Aunt   . Diabetes Daughter        x 2  . Hypertension  Brother      Prior to Admission medications   Medication Sig Start Date End Date Taking? Authorizing Provider  calcium carbonate (OS-CAL - DOSED IN MG OF ELEMENTAL CALCIUM) 1250 (500 Ca) MG tablet Take 600 mg by mouth.    [provider]  Cholecalciferol (VITAMIN D) 2000 UNITS CAPS Take 1 capsule by mouth daily.    [provider]  dexlansoprazole (DEXILANT) 60 MG capsule Take 60 mg by mouth daily.    [provider]  escitalopram (LEXAPRO) 10 MG tablet Take 10 mg by mouth.    [provider]  ranitidine (ZANTAC) 150 MG tablet Take 150 mg by mouth at bedtime.    [provider]  triamcinolone ointment (KENALOG) 0.1 % Apply small amt to reddened areas of lower legs 1-2 times daily UTD ( do not put on opened sores) 04/10/15   [provider]  UNABLE TO FIND Apply topically. TRIAMCINOLONE ACETONIDE (TRIAMCINOLONE 0.1%-SILVER SULFADIAZINE 1% 3:1 11/07/15   [provider]  XARELTO 20 MG TABS tablet Take 1 tablet by mouth daily. 04/10/14   [provider]    Physical Exam: Vitals:   10/07/16 1900 10/07/16 2000 10/07/16 2015 10/07/16 2037  BP: 126/70 137/82  (!) 152/69  Pulse: 74 72  74  Resp: 18 20    Temp:   100 F (37.8 C) 100.2 F (37.9 C)  TempSrc:   Oral Oral  SpO2: 95% 100%  100%   General: Not in acute distress HEENT:       Eyes: PERRL, EOMI, no scleral icterus.       ENT: No discharge from the ears and nose, no pharynx injection, no tonsillar enlargement.        Neck: No JVD, no bruit, no mass felt. Heme: No neck lymph node enlargement. Cardiac: S1/S2, RRR, No murmurs, No gallops or rubs. Respiratory: No rales, wheezing, rhonchi or rubs. GI: Soft, nondistended, nontender, no rebound pain, no organomegaly, BS present. GU: No hematuria Ext: No pitting leg edema bilaterally. 2+DP/PT pulse bilaterally. Musculoskeletal: No joint deformities, No joint redness or warmth, no limitation of ROM in spin. Skin: has  multiple small scores in arms and legs. There is an area (~10 cm in size) in right upper arm with redness, swelling and warmth. Neuro: Alert, oriented X3, cranial nerves II-XII grossly intact, moves all extremities normally.  Psych: Patient is not psychotic, no suicidal or hemocidal ideation.  Labs on Admission: I have personally reviewed following labs and imaging studies  CBC:  Recent Labs Lab 10/07/16 1418  WBC 12.3*  NEUTROABS 10.0*  HGB 13.4  HCT 39.6  MCV 95.9  PLT 326   Basic Metabolic Panel:  Recent Labs Lab 10/07/16 1418  NA 136  K 3.9  CL 105  CO2 24  GLUCOSE 105*  BUN 13  CREATININE 1.04*  CALCIUM 8.8*   GFR: CrCl cannot be calculated (Unknown ideal weight.). Liver Function Tests:  Recent Labs Lab 10/07/16 1418  AST 16  ALT 12*  ALKPHOS 67  BILITOT 1.1  PROT 7.0  ALBUMIN 3.5   No results for input(s): LIPASE, AMYLASE in the last 168 hours. No results for input(s): AMMONIA in the last 168 hours. Coagulation Profile:  Recent Labs Lab 10/07/16 1418  INR 1.26   Cardiac Enzymes: No results for input(s): CKTOTAL, CKMB, CKMBINDEX, TROPONINI in the last 168 hours. BNP (last 3 results) No results for input(s): PROBNP in the last 8760 hours. HbA1C: No results for input(s): HGBA1C in the last 72 hours. CBG: No results for input(s): GLUCAP in the last 168 hours. Lipid Profile: No results for input(s): CHOL, HDL, LDLCALC, TRIG, CHOLHDL, LDLDIRECT in the last 72 hours. Thyroid Function Tests: No results for input(s): TSH, T4TOTAL, FREET4, T3FREE, THYROIDAB in the last 72 hours. Anemia Panel: No results for input(s): VITAMINB12, FOLATE, FERRITIN, TIBC, IRON, RETICCTPCT in the last 72 hours. Urine analysis:    Component Value Date/Time   COLORURINE YELLOW 10/07/2016 [place] 10/07/2016 1437   LABSPEC 1.025 10/07/2016 1437   PHURINE 6.0 10/07/2016 1437   GLUCOSEU NEGATIVE 10/07/2016 1437   HGBUR NEGATIVE 10/07/2016 1437    BILIRUBINUR NEGATIVE 10/07/2016 1437   KETONESUR NEGATIVE 10/07/2016 1437   PROTEINUR NEGATIVE 10/07/2016 1437   UROBILINOGEN 1.0 11/13/2013 0304   NITRITE NEGATIVE 10/07/2016 1437   LEUKOCYTESUR NEGATIVE 10/07/2016 1437   Sepsis Labs: '@LABRCNTIP'$ (procalcitonin:4,lacticidven:4) )No results found for this or any previous visit (from the past 240 hour(s)).   Radiological Exams on Admission: Dg Chest 2 View  Result Date: 10/07/2016 CLINICAL DATA:  Three-day history of mid chest pain radiating into the left upper extremity associated with shortness of breath and chills. EXAM: CHEST  2 VIEW COMPARISON:  07/11/2016, 04/07/2016 and earlier. FINDINGS: Cardiac silhouette normal in size, unchanged. Thoracic aorta tortuous and atherosclerotic, unchanged. Hilar and mediastinal contours otherwise unremarkable. Mildly prominent bronchovascular markings diffusely and mild central peribronchial thickening, unchanged. Lungs otherwise clear. No localized airspace consolidation. No pleural effusions. No pneumothorax. Normal pulmonary vascularity. Degenerative changes involving the thoracic and upper lumbar spine. IMPRESSION: 1. Stable mild changes of chronic bronchitis and/or asthma. No acute cardiopulmonary disease. 2.  Aortic Atherosclerosis (ICD10-170.0) Electronically Signed   By: Evangeline Dakin M.D.   On: 10/07/2016 14:54     EKG: Independently reviewed. Sinus rhythm, QTC 420, LAD, nonspecific T-wave change.    Assessment/Plan Principal Problem:   Cellulitis of right arm Active Problems:   Depression   GERD   Pulmonary embolism (HCC)   DVT, lower extremity ([facility])   Sepsis ([facility]CC)   Chest pain   Cellulitis of right arm and sepsis: There is an area (~10 cm in size) in right upper arm with redness, swelling and warmth, consistent with cellulitis. Pt meets criteria for sepsis with leukocytosis, fever and tachypnea. Lactic acid is normal. Currently hemodynamically stable.  - will place on tele bed for  obs - IV ancef was started in ED (pt is allergic to penicillin in childhood with unknown reaction. Ancef was started in ED, patient seems to tolerate it well, we'll continue). - PRN Zofran for nausea, morphine and Percocet for pain - Blood cultures x 2  - ESR and CRP - will get Procalcitonin and trend lactic acid levels per sepsis protocol. - IVF: 2.5 L of NS bolus in ED, followed by 75 cc/h  Depression: Stable, no suicidal  or homicidal ideations. -Continue home medications: Lexapro  GERD: -Protonix -Pepcid   Hx of DVT and PE: -continue Xarelto  Chest pain: very typical chest pain in the setting of chills and shaking. Lasted only for 3-5 minutes. Currently no chest pain or shortness of breath. -Troponin 3 -prn NTG    DVT ppx: on Xarelto Code Status: Full code Family Communication: None at bed side.    Disposition Plan:  Anticipate discharge back to previous home environment Consults called:  none Admission status: Obs / tele    Date of Service 10/07/2016    Ivor Costa [facility] Pager [phone]  If 7PM-7AM, please contact night-coverage [email or web address] Password [facility] 10/07/2016, 8:41 PM

## 2016-10-07 NOTE — ED Provider Notes (Signed)
[facility] DEPT Provider Note   CSN: [id number] Arrival date & time: 10/07/16  1324     History   Chief Complaint Chief Complaint  Patient presents with  . Shortness of Breath  . Chest Pain    HPI Diane Barker is a 66 y.o. female who presents after having an episode of shaking that lasted around 1 hour today.  During this episode she reports that she remained conscious, felt slightly short of breath, anxious, and felt like someone and punched her in the chest.  She reports that this went away after about one hour without any interventions. She says that today she has been having cold flashes, saying that it is almost 80 in her apartment however she still feels cold and is under multiple gets. She reports that she has been otherwise well. She has multiple sores to her bilateral arms and legs, she says she has been told she has poor blood flow in her legs that causes changes in skin color and wounds to be present on her legs.   She reports that she has multiple wounds that  she scratches.  She is a one on her right upper arm that she says she placed a Band-Aid on as she could not stop scratching at, when she took the bandage off on Saturday says that it pulled her skin. She reports that she has a large area of redness on her arm and that initially it felt like there was a "knot" under it. She reports that it as felt warm to the touch but is currently not painful.  Of note she had a lymph node dissection on the right side for breast cancer.  HPI  Past Medical History:  Diagnosis Date  . Arthritis   . Breast cancer ([place]) 2000   Rt lumpectomy, Chemo/XRT/rt axillary node   . Depression   . Diverticulosis   . GERD (gastroesophageal reflux disease)   . Hx of adenomatous colonic polyps   . Low back pain   . Obesity     Patient Active Problem List   Diagnosis Date Noted  . Dyspnea on exertion 05/24/2015  . DVT, lower extremity ([place]) 05/09/2014  . Right calf pain 06/21/2013  .  Pulmonary embolism ([place]) 06/21/2013  . Dysphagia 06/27/2011  . Cough 06/27/2011  . CHEST PAIN 01/18/2009  . RHINITIS 04/13/2007  . Obstructive chronic bronchitis with acute bronchitis ([place]) 04/13/2007  . GERD 04/13/2007  . DEPRESSION 01/21/2007  . OSTEOARTHRITIS 01/21/2007  . LOW BACK PAIN 01/21/2007  . BREAST CANCER, HX OF 01/21/2007    Past Surgical History:  Procedure Laterality Date  . BREAST LUMPECTOMY     for CA txed w/ chemo and radiation, right  . TUBAL LIGATION    . UMBILICAL HERNIA REPAIR  1983    OB History    No data available       Home Medications    Prior to Admission medications   Medication Sig Start Date End Date Taking? Authorizing Provider  calcium carbonate (OS-CAL - DOSED IN MG OF ELEMENTAL CALCIUM) 1250 (500 Ca) MG tablet Take 600 mg by mouth.    [provider]  Cholecalciferol (VITAMIN D) 2000 UNITS CAPS Take 1 capsule by mouth daily.    [provider]  dexlansoprazole (DEXILANT) 60 MG capsule Take 60 mg by mouth daily.    [provider]  escitalopram (LEXAPRO) 10 MG tablet Take 10 mg by mouth.    [provider]  ranitidine (ZANTAC) 150 MG  tablet Take 150 mg by mouth at bedtime.    [provider]  triamcinolone ointment (KENALOG) 0.1 % Apply small amt to reddened areas of lower legs 1-2 times daily UTD ( do not put on opened sores) 04/10/15   [provider]  UNABLE TO FIND Apply topically. TRIAMCINOLONE ACETONIDE (TRIAMCINOLONE 0.1%-SILVER SULFADIAZINE 1% 3:1 11/07/15   [provider]  XARELTO 20 MG TABS tablet Take 1 tablet by mouth daily. 04/10/14   [provider]    Family History Family History  Problem Relation Age of Onset  . Asthma Mother   . COPD Mother   . Heart attack Mother   . Diabetes Mother   . Lung cancer Father   . Asthma Daughter   . Kidney failure Brother   . Diabetes Brother        x 2  . Heart attack Brother   . Breast cancer Maternal Aunt   .  Colon cancer Paternal Aunt   . Diabetes Daughter        x 2  . Hypertension Brother     Social History Social History  Substance Use Topics  . Smoking status: Never Smoker  . Smokeless tobacco: Never Used  . Alcohol use No     Allergies   Erythromycin; Morphine; Penicillins; and Relafen [nabumetone]   Review of Systems Review of Systems  Constitutional: Positive for chills, diaphoresis and fever.       Shaking  HENT: Negative for ear pain and sore throat.   Eyes: Negative for pain and visual disturbance.  Respiratory: Negative for cough, chest tightness and shortness of breath.   Cardiovascular: Positive for chest pain (5 minutes during shaking episode). Negative for palpitations.  Gastrointestinal: Negative for abdominal pain, nausea and vomiting.  Genitourinary: Negative for dysuria and hematuria.  Musculoskeletal: Negative for arthralgias, back pain, myalgias and neck pain.  Skin: Positive for wound. Negative for color change and rash.       Red area on right upper arm.   Neurological: Negative for seizures, syncope and light-headedness.  All other systems reviewed and are negative.    Physical Exam Updated Vital Signs BP 123/73   Pulse 80   Temp 99.5 F (37.5 C) (Oral)   Resp 20   SpO2 96%   Physical Exam  Constitutional: She appears well-developed and well-nourished. No distress.  HENT:  Head: Normocephalic and atraumatic.  Eyes: Conjunctivae are normal.  Neck: Neck supple. No JVD present.  Cardiovascular: Normal rate, regular rhythm, normal heart sounds and intact distal pulses.  Exam reveals no friction rub.   No murmur heard. Pulmonary/Chest: Effort normal and breath sounds normal. No stridor. No respiratory distress. She exhibits no tenderness.  Abdominal: Soft. Bowel sounds are normal. She exhibits no distension. There is no tenderness. There is no guarding.  Musculoskeletal: She exhibits no edema.  Neurological: She is alert.  Skin: Skin is warm  and dry.  Red area on right upper arm with central scab that is notably warm. It is mildly indurated, no fluctuance. Area is marked.   Skin to bilateral lower legs has discoloration, and multiple small approximately 1 cm shallow open wounds where patient report she scratches.  There does not appear to be secondary infection to any of the wounds other than the one previously described on her right upper arm.  Psychiatric: She has a normal mood and affect.  Nursing note and vitals reviewed.    ED Treatments / Results  Labs (all labs ordered are  listed, but only abnormal results are displayed) Labs Reviewed  COMPREHENSIVE METABOLIC PANEL - Abnormal; Notable for the following:       Result Value   Glucose, Bld 105 (*)    Creatinine, Ser 1.04 (*)    Calcium 8.8 (*)    ALT 12 (*)    GFR calc non Af Amer 54 (*)    All other components within normal limits  CBC WITH DIFFERENTIAL/PLATELET - Abnormal; Notable for the following:    WBC 12.3 (*)    Neutro Abs 10.0 (*)    All other components within normal limits  PROTIME-INR - Abnormal; Notable for the following:    Prothrombin Time 15.9 (*)    All other components within normal limits  CULTURE, BLOOD (ROUTINE X 2)  CULTURE, BLOOD (ROUTINE X 2)  URINALYSIS, ROUTINE W REFLEX MICROSCOPIC  I-STAT CG4 LACTIC ACID, ED  I-STAT TROPONIN, ED    EKG  EKG Interpretation  Date/Time:  Tuesday October 07 2016 13:37:53 EDT Ventricular Rate:  92 PR Interval:  168 QRS Duration: 94 QT Interval:  340 QTC Calculation: 420 R Axis:   -39 Text Interpretation:  Normal sinus rhythm Left axis deviation Incomplete right bundle branch block T wave abnormality, consider lateral ischemia Abnormal ECG No significant change since last tracing Confirmed by Orlie Dakin [phone]) on 10/07/2016 6:23:15 PM       Radiology Dg Chest 2 View  Result Date: 10/07/2016 CLINICAL DATA:  Three-day history of mid chest pain radiating into the left upper extremity associated  with shortness of breath and chills. EXAM: CHEST  2 VIEW COMPARISON:  07/11/2016, 04/07/2016 and earlier. FINDINGS: Cardiac silhouette normal in size, unchanged. Thoracic aorta tortuous and atherosclerotic, unchanged. Hilar and mediastinal contours otherwise unremarkable. Mildly prominent bronchovascular markings diffusely and mild central peribronchial thickening, unchanged. Lungs otherwise clear. No localized airspace consolidation. No pleural effusions. No pneumothorax. Normal pulmonary vascularity. Degenerative changes involving the thoracic and upper lumbar spine. IMPRESSION: 1. Stable mild changes of chronic bronchitis and/or asthma. No acute cardiopulmonary disease. 2.  Aortic Atherosclerosis (ICD10-170.0) Electronically Signed   By: Evangeline Dakin M.D.   On: 10/07/2016 14:54    Procedures Procedures (including critical care time)  Medications Ordered in ED Medications  acetaminophen (TYLENOL) 325 MG tablet (not administered)  ceFAZolin (ANCEF) IVPB 1 g/50 mL premix (not administered)  acetaminophen (TYLENOL) tablet 650 mg (650 mg Oral Given 10/07/16 1355)     Initial Impression / Assessment and Plan / ED Course  I have reviewed the triage vital signs and the nursing notes.  Pertinent labs & imaging results that were available during my care of the patient were reviewed by me and considered in my medical decision making (see chart for details).  Clinical Course as of Oct 07 2001  Tue Oct 07, 2016  1912 Spoke with hospitalist who will come see patient.   [EH]    Clinical Course User Index [EH] Lorin Glass, PA-C    Meade Maw presents today for evaluation of chills, intermittent chest pain and shaking episode. Her shaking episode appears consistent with rigors that appear to be secondary to cellulitis of her right upper extremity. Area of redness and erythema was marked.  Chest x-ray was obtained without acute abnormality.  Troponins and EKG were obtained and both  reviewed, both without cause for her symptoms today.  Labs are obtained and shows a leukocytosis with a neutrophil count of 10.  Based on symptoms and history of lymph node dissection on affected  arm I feel that oral antibiotics would not be sufficient for this patient and she requires admission for IV antibiotics.  Hospitalist was consultative agreed to admit the patient.  The patient was discussed with and seen by Dr. Cathleen Fears.  The patient appears reasonably stabilized for admission considering the current resources, flow, and capabilities available in the ED at this time, and I doubt any other [facility] requiring further screening and/or treatment in the ED prior to admission.   Final Clinical Impressions(s) / ED Diagnoses   Final diagnoses:  Cellulitis of right upper extremity    New Prescriptions New Prescriptions   No medications on file     Ollen Gross 10/09/16 4320    Orlie Dakin, MD 10/10/16 1105

## 2016-10-08 DIAGNOSIS — R079 Chest pain, unspecified: Secondary | ICD-10-CM | POA: Diagnosis not present

## 2016-10-08 DIAGNOSIS — I824Z1 Acute embolism and thrombosis of unspecified deep veins of right distal lower extremity: Secondary | ICD-10-CM

## 2016-10-08 DIAGNOSIS — A419 Sepsis, unspecified organism: Secondary | ICD-10-CM | POA: Diagnosis not present

## 2016-10-08 DIAGNOSIS — L03113 Cellulitis of right upper limb: Secondary | ICD-10-CM

## 2016-10-08 DIAGNOSIS — I2699 Other pulmonary embolism without acute cor pulmonale: Secondary | ICD-10-CM | POA: Diagnosis not present

## 2016-10-08 LAB — BASIC METABOLIC PANEL
ANION GAP: 6 (ref 5–15)
BUN: 11 mg/dL (ref 6–20)
CHLORIDE: 109 mmol/L (ref 101–111)
CO2: 24 mmol/L (ref 22–32)
Calcium: 7.8 mg/dL — ABNORMAL LOW (ref 8.9–10.3)
Creatinine, Ser: 1.02 mg/dL — ABNORMAL HIGH (ref 0.44–1.00)
GFR calc Af Amer: >60 mL/min (ref >60)
GFR calc non Af Amer: 56 mL/min — ABNORMAL LOW (ref >60)
GLUCOSE: 100 mg/dL — AB (ref 65–99)
POTASSIUM: 3.5 mmol/L (ref 3.5–5.1)
SODIUM: 139 mmol/L (ref 135–145)

## 2016-10-08 LAB — CBC
HEMATOCRIT: 33.4 % — AB (ref 36.0–46.0)
HEMOGLOBIN: 11.2 g/dL — AB (ref 12.0–15.0)
MCH: 32.7 pg (ref 26.0–34.0)
MCHC: 33.5 g/dL (ref 30.0–36.0)
MCV: 97.4 fL (ref 78.0–100.0)
Platelets: 173 10*3/uL (ref 150–400)
RBC: 3.43 MIL/uL — ABNORMAL LOW (ref 3.87–5.11)
RDW: 12.8 % (ref 11.5–15.5)
WBC: 10.6 10*3/uL — AB (ref 4.0–10.5)

## 2016-10-08 LAB — TROPONIN I

## 2016-10-08 LAB — HIV ANTIBODY (ROUTINE TESTING W REFLEX): HIV SCREEN 4TH GENERATION: NONREACTIVE

## 2016-10-08 NOTE — Progress Notes (Addendum)
PROGRESS NOTE  Diane Barker  ZMO:[id number] DOB: Jun 05, 1950 DOA: 10/07/2016 PCP: Shon Baton, MD  Brief Narrative:   The patient is a 66 year old female with history of breast cancer status post lumpectomy, radiation, and chemotherapy, DVT and PE on Xarelto, depression, GERD, chronic back pain who presented with right upper arm redness, swelling, fevers and chills. She states she had fevers and chills for 3 days prior to admission and then developed shakes so bad that she could barely call her daughter on the phone to let her know that she was ill. In the emergency department, she was found to have mild leukocytosis, fever of 100.9 Fahrenheit, tachycardia. Chest x-ray showed bronchitis without infiltrate. She was found to have a right upper extremity cellulitis.  She was started on broad-spectrum antibiotics.  Assessment & Plan:   Principal Problem:   Cellulitis of right arm Active Problems:   Depression   GERD   Pulmonary embolism (HCC)   DVT, lower extremity (HCC)   Sepsis (HCC)   Chest pain  Cellulitis of right arm and sepsis:   Pt meets criteria for sepsis with leukocytosis, fever and tachypnea. Lactic acid is normal. Currently hemodynamically stable.  Still having intermittent fevers.  Leukocytosis and tachypnea resolved.  Sepsis symptoms seem out of proportion to degree of cellulitis on exam increasing suspicion for toxin release or bacteremia.  She still has some lymph nodes on the right axilla. -  D/c telemetry -  Continue IV ancef  - Blood cultures x 2 pending (less than 24 hours) -  HIV negative -  May be able to discharge home tomorrow morning on keflex if blood cultures remain negative  Depression: Stable, no suicidal or homicidal ideations. -Continue home medications: Lexapro  GERD: stable -Protonix -Pepcid   Hx of DVT and PE:  Stable -continue Xarelto  Chest pain: very typical chest pain in the setting of chills and shaking. Lasted only for 3-5 minutes.  Currently no chest pain or shortness of breath. -Troponins negative -prn NTG  DVT prophylaxis:  xarelto Code Status:  full Family Communication:  Patient alone Disposition Plan:  Likely home tomorrow if cultures remain negative.     Consultants:   none  Procedures:  none  Antimicrobials:  Anti-infectives    Start     Dose/Rate Route Frequency Ordered Stop   10/08/16 0500  ceFAZolin (ANCEF) IVPB 1 g/50 mL premix     1 g 100 mL/hr over 30 Minutes Intravenous Every 8 hours 10/07/16 1955     10/07/16 1830  ceFAZolin (ANCEF) IVPB 1 g/50 mL premix     1 g 100 mL/hr over 30 Minutes Intravenous  Once 10/07/16 1824 10/07/16 1946       Subjective: Still having intermittent fevers and chills.  Arm never hurt and redness appears to be improving.    Objective: Vitals:   10/07/16 2015 10/07/16 2037 10/08/16 0630 10/08/16 1500  BP:  (!) 152/69 (!) 112/58 (!) 122/57  Pulse:  74 65 75  Resp:   16 16  Temp: 100 F (37.8 C) 100.2 F (37.9 C) 98.1 F (36.7 C) 99.4 F (37.4 C)  TempSrc: Oral Oral Oral Oral  SpO2:  100% 98% 100%  Weight:  115.7 kg (255 lb)    Height:  5\' 6"  (1.676 m)      Intake/Output Summary (Last 24 hours) at 10/08/16 1658 Last data filed at 10/08/16 1300  Gross per 24 hour  Intake  480 ml  Output                0 ml  Net              480 ml   Filed Weights   10/07/16 2037  Weight: 115.7 kg (255 lb)    Examination:  General exam:  Adult female.  No acute distress.  HEENT:  NCAT, MMM Respiratory system: Clear to auscultation bilaterally Cardiovascular system: Regular rate and rhythm, normal S1/S2. No murmurs, rubs, gallops or clicks.  Warm extremities Gastrointestinal system: Normal active bowel sounds, soft, nondistended, nontender. MSK:  Normal tone and bulk, no lower extremity edema Neuro:  Grossly intact Derm:  RUE lateral shoulder/upper arm has receeding erythema within the previously marked lines.      Data Reviewed: I have  personally reviewed following labs and imaging studies  CBC:  Recent Labs Lab 10/07/16 1418 10/08/16 0742  WBC 12.3* 10.6*  NEUTROABS 10.0*  --   HGB 13.4 11.2*  HCT 39.6 33.4*  MCV 95.9 97.4  PLT 218 233   Basic Metabolic Panel:  Recent Labs Lab 10/07/16 1418 10/08/16 0742  NA 136 139  K 3.9 3.5  CL 105 109  CO2 24 24  GLUCOSE 105* 100*  BUN 13 11  CREATININE 1.04* 1.02*  CALCIUM 8.8* 7.8*   GFR: Estimated Creatinine Clearance: 69.2 mL/min (A) (by C-G formula based on SCr of 1.02 mg/dL (H)). Liver Function Tests:  Recent Labs Lab 10/07/16 1418  AST 16  ALT 12*  ALKPHOS 67  BILITOT 1.1  PROT 7.0  ALBUMIN 3.5   No results for input(s): LIPASE, AMYLASE in the last 168 hours. No results for input(s): AMMONIA in the last 168 hours. Coagulation Profile:  Recent Labs Lab 10/07/16 1418  INR 1.26   Cardiac Enzymes:  Recent Labs Lab 10/07/16 2046 10/08/16 0316 10/08/16 0742  TROPONINI <0.03 <0.03 <0.03   BNP (last 3 results) No results for input(s): PROBNP in the last 8760 hours. HbA1C: No results for input(s): HGBA1C in the last 72 hours. CBG: No results for input(s): GLUCAP in the last 168 hours. Lipid Profile: No results for input(s): CHOL, HDL, LDLCALC, TRIG, CHOLHDL, LDLDIRECT in the last 72 hours. Thyroid Function Tests: No results for input(s): TSH, T4TOTAL, FREET4, T3FREE, THYROIDAB in the last 72 hours. Anemia Panel: No results for input(s): VITAMINB12, FOLATE, FERRITIN, TIBC, IRON, RETICCTPCT in the last 72 hours. Urine analysis:    Component Value Date/Time   COLORURINE YELLOW 10/07/2016 [place] 10/07/2016 1437   LABSPEC 1.025 10/07/2016 1437   PHURINE 6.0 10/07/2016 1437   GLUCOSEU NEGATIVE 10/07/2016 1437   HGBUR NEGATIVE 10/07/2016 1437   BILIRUBINUR NEGATIVE 10/07/2016 1437   KETONESUR NEGATIVE 10/07/2016 1437   PROTEINUR NEGATIVE 10/07/2016 1437   UROBILINOGEN 1.0 11/13/2013 0304   NITRITE NEGATIVE  10/07/2016 1437   LEUKOCYTESUR NEGATIVE 10/07/2016 1437   Sepsis Labs: @LABRCNTIP (procalcitonin:4,lacticidven:4)  ) Recent Results (from the past 240 hour(s))  Culture, blood (Routine x 2)     Status: None (Preliminary result)   Collection Time: 10/07/16  2:26 PM  Result Value Ref Range Status   Specimen Description BLOOD LEFT ANTECUBITAL  Final   Special Requests Blood Culture adequate volume  Final   Culture NO GROWTH < 24 HOURS  Final   Report Status PENDING  Incomplete  Culture, blood (Routine x 2)     Status: None (Preliminary result)   Collection Time: 10/07/16  4:53 PM  Result  Value Ref Range Status   Specimen Description BLOOD LEFT HAND  Final   Special Requests   Final    BOTTLES DRAWN AEROBIC AND ANAEROBIC Blood Culture adequate volume   Culture NO GROWTH < 24 HOURS  Final   Report Status PENDING  Incomplete      Radiology Studies: Dg Chest 2 View  Result Date: 10/07/2016 CLINICAL DATA:  Three-day history of mid chest pain radiating into the left upper extremity associated with shortness of breath and chills. EXAM: CHEST  2 VIEW COMPARISON:  07/11/2016, 04/07/2016 and earlier. FINDINGS: Cardiac silhouette normal in size, unchanged. Thoracic aorta tortuous and atherosclerotic, unchanged. Hilar and mediastinal contours otherwise unremarkable. Mildly prominent bronchovascular markings diffusely and mild central peribronchial thickening, unchanged. Lungs otherwise clear. No localized airspace consolidation. No pleural effusions. No pneumothorax. Normal pulmonary vascularity. Degenerative changes involving the thoracic and upper lumbar spine. IMPRESSION: 1. Stable mild changes of chronic bronchitis and/or asthma. No acute cardiopulmonary disease. 2.  Aortic Atherosclerosis (ICD10-170.0) Electronically Signed   By: Evangeline Dakin M.D.   On: 10/07/2016 14:54     Scheduled Meds: . calcium carbonate  1,250 mg Oral Q breakfast  . cholecalciferol  2,000 Units Oral Daily  .  escitalopram  10 mg Oral Daily  . famotidine  20 mg Oral QHS  . pantoprazole  40 mg Oral Daily  . rivaroxaban  20 mg Oral Q supper  . vitamin B-12  500 mcg Oral Daily   Continuous Infusions: . sodium chloride 75 mL/hr at 10/08/16 0837  .  ceFAZolin (ANCEF) IV Stopped (10/08/16 1316)     LOS: 0 days    Time spent: 30 min    Janece Canterbury, MD [facility] Pager [phone]  If 7PM-7AM, please contact night-coverage [email or web address] Password [facility] 10/08/2016, 4:58 PM

## 2016-10-09 DIAGNOSIS — I2699 Other pulmonary embolism without acute cor pulmonale: Secondary | ICD-10-CM | POA: Diagnosis not present

## 2016-10-09 DIAGNOSIS — L03113 Cellulitis of right upper limb: Secondary | ICD-10-CM | POA: Diagnosis not present

## 2016-10-09 DIAGNOSIS — A419 Sepsis, unspecified organism: Secondary | ICD-10-CM | POA: Diagnosis not present

## 2016-10-09 MED ORDER — CEPHALEXIN 500 MG PO CAPS: 500.0000 mg | ORAL_CAPSULE | Freq: Four times a day (QID) | ORAL | 0 refills | Status: AC

## 2016-10-09 NOTE — Progress Notes (Signed)
Discharge instructions, RX's and follow up appts explained and provided to patient verbalized understanding. Patient left floor via wheelchair accompanied by staff no c/o pain or shortness of breath at d/c.  Tashiya Souders [place], RN  

## 2016-10-09 NOTE — Discharge Summary (Signed)
Physician Discharge Summary  Diane Barker OHY:[id number] DOB: 08/27/1950 DOA: 10/07/2016  PCP: Shon Baton, MD  Admit date: 10/07/2016 Discharge date: 10/09/2016  Admitted From: home  Disposition:  home  Recommendations for Outpatient Follow-up:  1. Follow up with PCP in 1 week as needed 2. Please obtain BMP/CBC in one week 3. Please follow up on the following pending results:  Blood cultures  Home Health:  none  Equipment/Devices:  None  Discharge Condition:  Stable, improved CODE STATUS:  Full code  Diet recommendation:  regular  Brief/Interim Summary:  The patient is a 66 year old female with history of breast cancer status post lumpectomy, radiation, and chemotherapy, DVT and PE on Xarelto, depression, GERD, chronic back pain who presented with right upper arm redness, swelling, fevers and chills. She states she had fevers and chills for 3 days prior to admission and then developed shakes so bad that she could barely call her daughter on the phone to let her know that she was ill. In the emergency department, she was found to have mild leukocytosis, fever of 100.9 Fahrenheit, tachycardia. Chest x-ray showed bronchitis without infiltrate.  Urine culture was no growth.  She was found to have a right upper extremity cellulitis.  She was started on broad-spectrum antibiotics and her fevers and leukocytosis improved.  Her right arm erythema decreased.  Her blood cultures remained no growth after 1.5 days and she was discharged home to continue keflex to complete a 1 week course.    Discharge Diagnoses:  Principal Problem:   Cellulitis of right arm Active Problems:   Depression   GERD   Pulmonary embolism (HCC)   DVT, lower extremity (HCC)   Sepsis (HCC)   Chest pain  Cellulitis of right arm and sepsis:   Pt meets criteria for sepsis with leukocytosis, fever and tachypnea. Lactic acid was normal. Currently hemodynamically stable.  Fevers, leukocytosis and tachypnea resolved.  Sepsis  symptoms seem out of proportion to degree of cellulitis but blood cultures negative -  improved on IV ancef  - Blood cultures x 2 NGTD -  HIV negative - Keflex to complete a 7-day course   Depression:Stable, no suicidal or homicidal ideations. -Continued home medications: Lexapro  GERD: stable -Protonix -Pepcid   Hx of DVT and PE:  Stable -continued Xarelto  Chest pain: very atypical chest pain in the setting of chills and shaking. Lasted only for 3-5 minutes. Currently no chest pain or shortness of breath. -Troponins negative - defer further work up to PCP, but none felt indicated at this time  Discharge Instructions  Discharge Instructions    Call MD for:  difficulty breathing, headache or visual disturbances    Complete by:  As directed    Call MD for:  extreme fatigue    Complete by:  As directed    Call MD for:  hives    Complete by:  As directed    Call MD for:  persistant dizziness or light-headedness    Complete by:  As directed    Call MD for:  persistant nausea and vomiting    Complete by:  As directed    Call MD for:  severe uncontrolled pain    Complete by:  As directed    Call MD for:  temperature >100.4    Complete by:  As directed    Diet general    Complete by:  As directed    Increase activity slowly    Complete by:  As directed  Medication List    TAKE these medications   calcium carbonate 1250 (500 Ca) MG tablet Commonly known as:  OS-CAL - dosed in mg of elemental calcium Take 600 mg by mouth.   cephALEXin 500 MG capsule Commonly known as:  KEFLEX Take 1 capsule (500 mg total) by mouth 4 (four) times daily.   dexlansoprazole 60 MG capsule Commonly known as:  DEXILANT Take 60 mg by mouth daily.   escitalopram 10 MG tablet Commonly known as:  LEXAPRO Take 10 mg by mouth.   ranitidine 150 MG tablet Commonly known as:  ZANTAC Take 150 mg by mouth at bedtime.   vitamin B-12 500 MCG tablet Commonly known as:   CYANOCOBALAMIN Take 500 mcg by mouth daily.   Vitamin D 2000 units Caps Take 4,000 Units by mouth daily.   XARELTO 20 MG Tabs tablet Generic drug:  rivaroxaban Take 1 tablet by mouth daily.       Allergies  Allergen Reactions  . Erythromycin Other (See Comments)    REACTION: stomach cramps  . Morphine Other (See Comments)    REACTION: Hallucinations  . Penicillins Other (See Comments)    REACTION: unkkown, was a child  . Relafen [Nabumetone] Nausea Only    Consultations: none   Procedures/Studies: Dg Chest 2 View  Result Date: 10/07/2016 CLINICAL DATA:  Three-day history of mid chest pain radiating into the left upper extremity associated with shortness of breath and chills. EXAM: CHEST  2 VIEW COMPARISON:  07/11/2016, 04/07/2016 and earlier. FINDINGS: Cardiac silhouette normal in size, unchanged. Thoracic aorta tortuous and atherosclerotic, unchanged. Hilar and mediastinal contours otherwise unremarkable. Mildly prominent bronchovascular markings diffusely and mild central peribronchial thickening, unchanged. Lungs otherwise clear. No localized airspace consolidation. No pleural effusions. No pneumothorax. Normal pulmonary vascularity. Degenerative changes involving the thoracic and upper lumbar spine. IMPRESSION: 1. Stable mild changes of chronic bronchitis and/or asthma. No acute cardiopulmonary disease. 2.  Aortic Atherosclerosis (ICD10-170.0) Electronically Signed   By: Evangeline Dakin M.D.   On: 10/07/2016 14:54    Subjective: Feeling much better.  No rigors, subjective fevers or chills overnight.  Arm looks less red.  Would like to be able to go home today.  Denies chest pains but has been coughing some.    Discharge Exam: Vitals:   10/08/16 2042 10/09/16 0525  BP: (!) 118/54 134/60  Pulse: 73 65  Resp: 16 15  Temp: 98.5 F (36.9 C) 98.4 F (36.9 C)   Vitals:   10/08/16 0630 10/08/16 1500 10/08/16 2042 10/09/16 0525  BP: (!) 112/58 (!) 122/57 (!) 118/54 134/60   Pulse: 65 75 73 65  Resp: 16 16 16 15   Temp: 98.1 F (36.7 C) 99.4 F (37.4 C) 98.5 F (36.9 C) 98.4 F (36.9 C)  TempSrc: Oral Oral Oral Oral  SpO2: 98% 100% 100% 97%  Weight:      Height:        General: Pt is alert, awake, not in acute distress Cardiovascular: RRR, S1/S2 +, no rubs, no gallops Respiratory: CTA bilaterally, no wheezing, no rhonchi Abdominal: Soft, NT, ND, bowel sounds + Extremities: no edema, no cyanosis Derm:  Right lateral shoulder with 2cm area of yellowish red plaque where she previously had a bandage.  Adjacent to a 1cm area of dark red well-demarcated skin.  Surrounding pink injection has receded from the drawn line.      The results of significant diagnostics from this hospitalization (including imaging, microbiology, ancillary and laboratory) are listed below for reference.  Microbiology: Recent Results (from the past 240 hour(s))  Culture, blood (Routine x 2)     Status: None (Preliminary result)   Collection Time: 10/07/16  2:26 PM  Result Value Ref Range Status   Specimen Description BLOOD LEFT ANTECUBITAL  Final   Special Requests Blood Culture adequate volume  Final   Culture NO GROWTH 2 DAYS  Final   Report Status PENDING  Incomplete  Culture, blood (Routine x 2)     Status: None (Preliminary result)   Collection Time: 10/07/16  4:53 PM  Result Value Ref Range Status   Specimen Description BLOOD LEFT HAND  Final   Special Requests   Final    BOTTLES DRAWN AEROBIC AND ANAEROBIC Blood Culture adequate volume   Culture NO GROWTH 2 DAYS  Final   Report Status PENDING  Incomplete     Labs: BNP (last 3 results) No results for input(s): BNP in the last 8760 hours. Basic Metabolic Panel:  Recent Labs Lab 10/07/16 1418 10/08/16 0742  NA 136 139  K 3.9 3.5  CL 105 109  CO2 24 24  GLUCOSE 105* 100*  BUN 13 11  CREATININE 1.04* 1.02*  CALCIUM 8.8* 7.8*   Liver Function Tests:  Recent Labs Lab 10/07/16 1418  AST 16  ALT 12*   ALKPHOS 67  BILITOT 1.1  PROT 7.0  ALBUMIN 3.5   No results for input(s): LIPASE, AMYLASE in the last 168 hours. No results for input(s): AMMONIA in the last 168 hours. CBC:  Recent Labs Lab 10/07/16 1418 10/08/16 0742  WBC 12.3* 10.6*  NEUTROABS 10.0*  --   HGB 13.4 11.2*  HCT 39.6 33.4*  MCV 95.9 97.4  PLT 218 173   Cardiac Enzymes:  Recent Labs Lab 10/07/16 2046 10/08/16 0316 10/08/16 0742  TROPONINI <0.03 <0.03 <0.03   BNP: Invalid input(s): POCBNP CBG: No results for input(s): GLUCAP in the last 168 hours. D-Dimer No results for input(s): DDIMER in the last 72 hours. Hgb A1c No results for input(s): HGBA1C in the last 72 hours. Lipid Profile No results for input(s): CHOL, HDL, LDLCALC, TRIG, CHOLHDL, LDLDIRECT in the last 72 hours. Thyroid function studies No results for input(s): TSH, T4TOTAL, T3FREE, THYROIDAB in the last 72 hours.  Invalid input(s): FREET3 Anemia work up No results for input(s): VITAMINB12, FOLATE, FERRITIN, TIBC, IRON, RETICCTPCT in the last 72 hours. Urinalysis    Component Value Date/Time   COLORURINE YELLOW 10/07/2016 [place] 10/07/2016 1437   LABSPEC 1.025 10/07/2016 1437   PHURINE 6.0 10/07/2016 1437   GLUCOSEU NEGATIVE 10/07/2016 1437   HGBUR NEGATIVE 10/07/2016 1437   BILIRUBINUR NEGATIVE 10/07/2016 1437   KETONESUR NEGATIVE 10/07/2016 1437   PROTEINUR NEGATIVE 10/07/2016 1437   UROBILINOGEN 1.0 11/13/2013 0304   NITRITE NEGATIVE 10/07/2016 1437   LEUKOCYTESUR NEGATIVE 10/07/2016 1437   Sepsis Labs Invalid input(s): PROCALCITONIN,  WBC,  LACTICIDVEN   Time coordinating discharge: Over 30 minutes  SIGNED:   Janece Canterbury, MD  [facility] 10/09/2016, 3:23 PM Pager   If 7PM-7AM, please contact night-coverage [email or web address] Password [email or web address]

## 2016-10-09 NOTE — Care Management Note (Signed)
Case Management Note  Patient Details  Name: Diane Barker MRN: [id number] Date of Birth: [birth year]  Subjective/Objective:   Cellulitis of right arm              Action/Plan: Discharge Planning: NCM spoke to pt and she lives at home alone. She is independent prior to hospital stay. Pt requesting to see Chaplain to discuss HCPOA. Referral sent. No DME need. Can afford medications.   PCP Shon Baton MD   Expected Discharge Date:  10/09/16               Expected Discharge Plan:  Home/Self Care  In-House Referral:  NA  Discharge planning Services  CM Consult  Post Acute Care Choice:  NA Choice offered to:  NA  DME Arranged:  N/A DME Agency:  NA  HH Arranged:  NA HH Agency:  NA  Status of Service:  Completed, signed off  If discussed at [place] of Stay Meetings, dates discussed:    Additional Comments:  Erenest Rasher, RN 10/09/2016, 11:46 AM

## 2016-10-12 LAB — CULTURE, BLOOD (ROUTINE X 2)
Culture: NO GROWTH
Culture: NO GROWTH
SPECIAL REQUESTS: ADEQUATE
Special Requests: ADEQUATE

## 2016-11-07 ENCOUNTER — Other Ambulatory Visit (HOSPITAL_COMMUNITY): Payer: Self-pay | Admitting: *Deleted

## 2016-11-10 ENCOUNTER — Ambulatory Visit (HOSPITAL_COMMUNITY): Payer: Medicare Other

## 2016-11-21 ENCOUNTER — Other Ambulatory Visit (HOSPITAL_COMMUNITY): Payer: Self-pay | Admitting: *Deleted

## 2016-11-24 ENCOUNTER — Ambulatory Visit (HOSPITAL_COMMUNITY)
Admission: RE | Admit: 2016-11-24 | Discharge: 2016-11-24 | Disposition: A | Discharge status: Home / Self Care | Payer: Medicare Other | Source: Ambulatory Visit | Attending: Internal Medicine | Admitting: Internal Medicine

## 2016-11-24 DIAGNOSIS — M810 Age-related osteoporosis without current pathological fracture: Secondary | ICD-10-CM | POA: Diagnosis present

## 2016-11-24 MED ORDER — ZOLEDRONIC ACID 5 MG/100ML IV SOLN
5.0000 mg | Freq: Once | INTRAVENOUS | Status: AC
  Administered 2016-11-24: 5 mg via INTRAVENOUS

## 2016-11-24 MED ORDER — ZOLEDRONIC ACID 5 MG/100ML IV SOLN
INTRAVENOUS | Status: AC
  Administered 2016-11-24: 5 mg via INTRAVENOUS
  Filled 2016-11-24: qty 100

## 2017-05-13 ENCOUNTER — Other Ambulatory Visit: Payer: Self-pay | Admitting: Internal Medicine

## 2017-05-13 DIAGNOSIS — Z139 Encounter for screening, unspecified: Secondary | ICD-10-CM

## 2017-05-20 ENCOUNTER — Telehealth: Payer: Self-pay | Admitting: Internal Medicine

## 2017-05-20 MED ORDER — AZITHROMYCIN 250 MG PO TABS: 250.0000 mg | ORAL_TABLET | Freq: Every day | ORAL | 0 refills | Status: DC

## 2017-05-20 NOTE — Telephone Encounter (Signed)
CY gave me verbal to send in [place] #1 take as directed no refills to [facility]. Pt was made aware that Rx was sent. Nothing more needed at this time.

## 2017-06-01 ENCOUNTER — Ambulatory Visit
Admission: RE | Admit: 2017-06-01 | Discharge: 2017-06-01 | Disposition: A | Discharge status: Home / Self Care | Payer: 59 | Source: Ambulatory Visit | Attending: Internal Medicine | Admitting: Internal Medicine

## 2017-06-01 DIAGNOSIS — Z139 Encounter for screening, unspecified: Secondary | ICD-10-CM

## 2017-06-01 HISTORY — DX: Personal history of antineoplastic chemotherapy: Z92.21

## 2017-06-01 HISTORY — DX: Personal history of irradiation: Z92.3

## 2017-07-29 ENCOUNTER — Encounter (INDEPENDENT_AMBULATORY_CARE_PROVIDER_SITE_OTHER): Payer: Self-pay | Admitting: Physician Assistant

## 2017-07-29 ENCOUNTER — Ambulatory Visit (INDEPENDENT_AMBULATORY_CARE_PROVIDER_SITE_OTHER): Payer: 59

## 2017-07-29 ENCOUNTER — Ambulatory Visit (INDEPENDENT_AMBULATORY_CARE_PROVIDER_SITE_OTHER): Payer: 59 | Admitting: Physician Assistant

## 2017-07-29 DIAGNOSIS — M170 Bilateral primary osteoarthritis of knee: Secondary | ICD-10-CM | POA: Insufficient documentation

## 2017-07-29 NOTE — Progress Notes (Signed)
Office Visit Note   Patient: Diane Barker           Date of Birth: [birth year]           MRN: [id number] Visit Date: 07/29/2017              Requested by: Shon Baton, [address] [place], [place] [address] PCP: Shon Baton, MD   Assessment & Plan: Visit Diagnoses:  1. Primary osteoarthritis of both knees     Plan: Discussed with her conservative treatment which would be steroid injections in her knees she defers.  She states she is afraid of needles.  She is unable take NSAIDs due to the fact she is on Xarelto due to history of PE.  Recommend at this time she does work on Forensic scientist and knee friendly exercises which were discussed with her.  She follow-up with [place] on as-needed basis pain persist or becomes worse.  Questions encouraged and answered.  Follow-Up Instructions: Return if symptoms worsen or fail to improve.   Orders:  Orders Placed This Encounter  Procedures  . XR KNEE 3 VIEW RIGHT  . XR KNEE 3 VIEW LEFT   No orders of the defined types were placed in this encounter.     Procedures: No procedures performed   Clinical Data: No additional findings.   Subjective: Chief Complaint  Patient presents with  . Right Knee - Pain  . Left Knee - Pain    HPI Diane Barker is a 67 year old female comes in today with bilateral knee pain.  She is had no injury to either knee.  States the right knee began bothering her 2 months ago and left knee started bothering her yesterday.  She describes some locking of the right knee at times but no other mechanical symptoms of either knee.  She notes no swelling of either knee.  She has difficulty with steps.  Also getting up from a sitting position is difficult for her.  She states that she ambulates for prolonged period time she has pain in both knees.  She has a history of pulmonary embolism and is on Xarelto unable to take NSAIDs. Review of Systems Please see HPI otherwise negative  Objective: Vital Signs: There were  no vitals taken for this visit.  Physical Exam  Constitutional: She is oriented to person, place, and time. She appears well-developed and well-nourished. No distress.  Pulmonary/Chest: Effort normal.  Neurological: She is alert and oriented to person, place, and time.  Skin: She is not diaphoretic.  Psychiatric: She has a normal mood and affect.    Ortho Exam Bilateral knees good range of motion.  Patellofemoral crepitus noted bilaterally.  Tenderness medial joint line of the left knee and medial and lateral joint line of the right knee.  No instability valgus varus stressing of either knee.  Anterior drawer is negative bilaterally.  No effusion abnormal warmth or erythema of either knee. Specialty Comments:  No specialty comments available.  Imaging: Xr Knee 3 View Left  Result Date: 07/29/2017 Left knee AP lateral and sunrise view: No acute fracture.  No subluxation dislocation.  Tricompartmental arthritis with moderate medial and severe patellofemoral changes.  No bony abnormalities otherwise  Xr Knee 3 View Right  Result Date: 07/29/2017 Right knee 3 views severe patellofemoral arthritis.  Moderate medial compartmental narrowing.  Lateral compartment with some minimal arthritic changes.  No subluxation dislocation.  No acute fractures.    PMFS History: Patient Active Problem List   Diagnosis  Date Noted  . Primary osteoarthritis of both knees 07/29/2017  . Cellulitis of right arm 10/07/2016  . Sepsis ([place]) 10/07/2016  . Chest pain 10/07/2016  . Dyspnea on exertion 05/24/2015  . DVT, lower extremity ([place]) 05/09/2014  . Right calf pain 06/21/2013  . Pulmonary embolism ([place]) 06/21/2013  . Dysphagia 06/27/2011  . Cough 06/27/2011  . CHEST PAIN 01/18/2009  . RHINITIS 04/13/2007  . Obstructive chronic bronchitis with acute bronchitis ([place]) 04/13/2007  . GERD 04/13/2007  . Depression 01/21/2007  . OSTEOARTHRITIS 01/21/2007  . LOW BACK PAIN 01/21/2007  . BREAST CANCER, HX OF  01/21/2007   Past Medical History:  Diagnosis Date  . Arthritis   . Breast cancer ([place]) 2000   Rt lumpectomy, Chemo/XRT/rt axillary node   . Depression   . Diverticulosis   . GERD (gastroesophageal reflux disease)   . Hx of adenomatous colonic polyps   . Low back pain   . Obesity   . Personal history of chemotherapy   . Personal history of radiation therapy     Family History  Problem Relation Age of Onset  . Asthma Mother   . COPD Mother   . Heart attack Mother   . Diabetes Mother   . Lung cancer Father   . Asthma Daughter   . Kidney failure Brother   . Diabetes Brother        x 2  . Heart attack Brother   . Breast cancer Maternal Aunt   . Colon cancer Paternal Aunt   . Diabetes Daughter        x 2  . Hypertension Brother     Past Surgical History:  Procedure Laterality Date  . BREAST BIOPSY    . BREAST LUMPECTOMY Right    for CA txed w/ chemo and radiation, right  . TUBAL LIGATION    . [place]   Social History   Occupational History  . 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 Smoking status: Never Smoker  . Smokeless tobacco: Never Used  Substance and Sexual Activity  . Alcohol use: No    Alcohol/week: 0.0 oz  . Drug use: No  . Sexual activity: Yes

## 2017-10-01 ENCOUNTER — Ambulatory Visit: Payer: 59 | Admitting: Internal Medicine

## 2017-10-02 ENCOUNTER — Encounter: Payer: Self-pay | Admitting: Internal Medicine

## 2017-10-02 ENCOUNTER — Ambulatory Visit (INDEPENDENT_AMBULATORY_CARE_PROVIDER_SITE_OTHER)
Admission: RE | Admit: 2017-10-02 | Discharge: 2017-10-02 | Disposition: A | Discharge status: Home / Self Care | Payer: 59 | Source: Ambulatory Visit | Attending: Internal Medicine | Admitting: Internal Medicine

## 2017-10-02 ENCOUNTER — Ambulatory Visit (INDEPENDENT_AMBULATORY_CARE_PROVIDER_SITE_OTHER): Payer: 59 | Admitting: Internal Medicine

## 2017-10-02 VITALS — BP 122/78 | HR 75 | Ht 65.5 in | Wt 261.4 lb

## 2017-10-02 DIAGNOSIS — J440 Chronic obstructive pulmonary disease with acute lower respiratory infection: Secondary | ICD-10-CM

## 2017-10-02 DIAGNOSIS — J209 Acute bronchitis, unspecified: Secondary | ICD-10-CM | POA: Diagnosis not present

## 2017-10-02 DIAGNOSIS — J42 Unspecified chronic bronchitis: Secondary | ICD-10-CM | POA: Diagnosis not present

## 2017-10-02 DIAGNOSIS — R0789 Other chest pain: Secondary | ICD-10-CM | POA: Diagnosis not present

## 2017-10-02 MED ORDER — METHYLPREDNISOLONE ACETATE 80 MG/ML IJ SUSP
80.0000 mg | Freq: Once | INTRAMUSCULAR | Status: AC
  Administered 2017-10-02: 80 mg via INTRAMUSCULAR

## 2017-10-02 MED ORDER — LEVALBUTEROL HCL 0.63 MG/3ML IN NEBU
0.6300 mg | INHALATION_SOLUTION | RESPIRATORY_TRACT | Status: AC
Start: 2017-10-02 — End: 2017-10-02
  Administered 2017-10-02: 0.63 mg via RESPIRATORY_TRACT

## 2017-10-02 NOTE — Assessment & Plan Note (Signed)
She describes an atypical left upper anterior chest pain as being aggravated by hard coughing and nonexertional.  It is probably musculoskeletal but other etiologies will be considered if it persists or gets worse, as discussed with her.
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
## 2017-10-02 NOTE — Patient Instructions (Addendum)
Order- neb xop 0.63    Dx acute bronchitis             Depo 1  Order- schedule PFT    Dx chronic bronchitis  Order- CXR    Dx  Acute bronchitis

## 2017-10-02 NOTE — Assessment & Plan Note (Signed)
I doubt she has a retained aspirated foreign body but she is irritated her upper airway. We will treat for bronchitis exacerbation with nebulizer treatment and Depo-Medrol, then check CXR.  If she has not improved by the first of next week we will reconsider.  She has noted cough with eating and drinking over the past month and if this persists I told her we would need to consider swallowing evaluation. Plan-CXR, nebulizer treatment Xopenex, Depo-Medrol

## 2017-10-12 ENCOUNTER — Telehealth: Payer: Self-pay

## 2017-10-12 ENCOUNTER — Ambulatory Visit (INDEPENDENT_AMBULATORY_CARE_PROVIDER_SITE_OTHER): Payer: 59 | Admitting: Internal Medicine

## 2017-10-12 ENCOUNTER — Encounter: Payer: Self-pay | Admitting: Internal Medicine

## 2017-10-12 VITALS — BP 128/68 | HR 75 | Ht 65.5 in | Wt 254.0 lb

## 2017-10-12 DIAGNOSIS — R05 Cough: Secondary | ICD-10-CM

## 2017-10-12 DIAGNOSIS — K219 Gastro-esophageal reflux disease without esophagitis: Secondary | ICD-10-CM | POA: Diagnosis not present

## 2017-10-12 DIAGNOSIS — R053 Chronic cough: Secondary | ICD-10-CM

## 2017-10-12 DIAGNOSIS — Z86010 Personal history of colonic polyps: Secondary | ICD-10-CM | POA: Diagnosis not present

## 2017-10-12 NOTE — Progress Notes (Signed)
Patient ID: Diane Barker, female   DOB: Mar 10, 1951, 67 y.o.   MRN: [id number]  HPI: Diane Barker is a 67 year old female with a past medical history of GERD, adenomatous colon polyp, colonic diverticulosis, history of breast cancer status post chemotherapy and radiation, chronic cough who is seen in consult at the request of Dr. Annamaria Boots to evaluate chronic cough.  She is here alone today.  She is known to our group after having previously been cared for by Dr. Olevia Perches prior to her retirement.  In the past she had cough which was very responsive to PPI therapy.  She had an upper endoscopy performed on 09/26/2011 which showed normal esophagus.  Mild gastritis.  GE junction biopsies showed slight inflammation consistent with reflux but no metaplasia, dysplasia or malignancy.  Gastric biopsy showed slight chronic gastritis without H. pylori.  Colonoscopy performed on 03/28/2010 revealed a small tubular adenoma in the cecum and diverticulosis.  She reports that her biggest complaint is coughing which occurs on a daily basis.  This seems to come in spells but last 3 to 4 months when it occurs.  In the past she reports Dexilant was her "miracle pill" but her cough is now returned despite her using Dexilant daily.  She takes 60 mg.  She rarely has any heartburn but can feel indigestion and acid regurgitation if she misses a dose.  She is not using ranitidine at bedtime.  Throat clearing is an issue.  She also can have coughing related to eating and drinking.  Cough seems to be worse with talking a lot.  Can happen at day or night but seems to be less likely at night.  She has had breathing treatments and most recently [employer] but does not feel that [employer] has been as helpful.  She uses Xarelto for her history of DVT.  Past Medical History:  Diagnosis Date  . Arthritis   . Breast cancer ([place]) 2000   Rt lumpectomy, Chemo/XRT/rt axillary node   . Depression   . Diverticulosis   . GERD (gastroesophageal reflux disease)    . Hx of adenomatous colonic polyps   . Low back pain   . Obesity   . Personal history of chemotherapy   . Personal history of radiation therapy     Past Surgical History:  Procedure Laterality Date  . BREAST BIOPSY    . BREAST LUMPECTOMY Right    for CA txed w/ chemo and radiation, right  . TUBAL LIGATION    . Liberal    Outpatient Medications Prior to Visit  Medication Sig Dispense Refill  . calcium carbonate (OS-CAL - DOSED IN MG OF ELEMENTAL CALCIUM) 1250 (500 Ca) MG tablet Take 600 mg by mouth.    . Cholecalciferol (VITAMIN D) 2000 UNITS CAPS Take 4,000 Units by mouth daily.     Marland Kitchen dexlansoprazole (DEXILANT) 60 MG capsule Take 60 mg by mouth daily.    Marland Kitchen escitalopram (LEXAPRO) 10 MG tablet Take 10 mg by mouth.    Marland Kitchen PROAIR HFA 108 (90 Base) MCG/ACT inhaler TAKE 2 PUFFS BY MOUTH EVERY 4 HOURS AS NEEDED  2  . ranitidine (ZANTAC) 150 MG tablet Take 150 mg by mouth at bedtime.    . vitamin B-12 (CYANOCOBALAMIN) 500 MCG tablet Take 500 mcg by mouth daily.    Alveda Reasons 20 MG TABS tablet Take 1 tablet by mouth daily.  5   No facility-administered medications prior to visit.     Allergies  Allergen Reactions  . Erythromycin Other (See Comments)    REACTION: stomach cramps  . Morphine Other (See Comments)    REACTION: Hallucinations  . Penicillins Other (See Comments)    REACTION: unkkown, was a child  . Relafen [Nabumetone] Nausea Only    Family History  Problem Relation Age of Onset  . Asthma Mother   . COPD Mother   . Heart attack Mother   . Diabetes Mother   . Lung cancer Father   . Asthma Daughter   . Kidney failure Brother   . Diabetes Brother        x 2  . Heart attack Brother   . Breast cancer Maternal Aunt   . Colon cancer Paternal Aunt   . Diabetes Daughter        x 2  . Hypertension Brother     Social History   Tobacco Use  . Smoking status: Never Smoker  . Smokeless tobacco: Never Used  Substance Use Topics  . Alcohol use:  No    Alcohol/week: 0.0 oz  . Drug use: No    ROS: As per history of present illness, otherwise negative  BP 128/68   Pulse 75   Ht 5' 5.5" (1.664 m)   Wt 254 lb (115.2 kg)   BMI 41.62 kg/m  Constitutional: Well-developed and well-nourished. No distress. HEENT: Normocephalic and atraumatic. Conjunctivae are normal.  No scleral icterus. Neck: Neck supple. Trachea midline. Cardiovascular: Normal rate, regular rhythm and intact distal pulses. No M/R/G Pulmonary/chest: Effort normal and breath sounds normal. No wheezing, rales or rhonchi. Abdominal: Soft, nontender, nondistended. Bowel sounds active throughout.  Extremities: no clubbing, cyanosis, or edema Neurological: Alert and oriented to person place and time. Skin: Skin is warm and dry.  Psychiatric: Normal mood and affect. Behavior is normal.  RELEVANT LABS AND IMAGING: CBC    Component Value Date/Time   WBC 10.6 (H) 10/08/2016 0742   RBC 3.43 (L) 10/08/2016 0742   HGB 11.2 (L) 10/08/2016 0742   HGB 13.4 06/04/2012 1518   HCT 33.4 (L) 10/08/2016 0742   HCT 38.3 06/04/2012 1518   PLT 173 10/08/2016 0742   PLT 228 06/04/2012 1518   MCV 97.4 10/08/2016 0742   MCV 97.2 06/04/2012 1518   MCH 32.7 10/08/2016 0742   MCHC 33.5 10/08/2016 0742   RDW 12.8 10/08/2016 0742   RDW 13.3 06/04/2012 1518   LYMPHSABS 1.2 10/07/2016 1418   LYMPHSABS 1.6 06/04/2012 1518   MONOABS 1.0 10/07/2016 1418   MONOABS 0.4 06/04/2012 1518   EOSABS 0.0 10/07/2016 1418   EOSABS 0.1 06/04/2012 1518   BASOSABS 0.1 10/07/2016 1418   BASOSABS 0.0 06/04/2012 1518    CMP     Component Value Date/Time   NA 139 10/08/2016 0742   NA 140 06/04/2012 1518   K 3.5 10/08/2016 0742   K 3.7 06/04/2012 1518   CL 109 10/08/2016 0742   CL 106 06/04/2012 1518   CO2 24 10/08/2016 0742   CO2 26 06/04/2012 1518   GLUCOSE 100 (H) 10/08/2016 0742   GLUCOSE 128 (H) 06/04/2012 1518   BUN 11 10/08/2016 0742   BUN 16.8 06/04/2012 1518   CREATININE 1.02 (H)  10/08/2016 0742   CREATININE 1.2 (H) 06/04/2012 1518   CALCIUM 7.8 (L) 10/08/2016 0742   CALCIUM 9.4 06/04/2012 1518   PROT 7.0 10/07/2016 1418   PROT 6.9 06/04/2012 1518   ALBUMIN 3.5 10/07/2016 1418   ALBUMIN 3.5 06/04/2012 1518   AST 16 10/07/2016  1418   AST 17 06/04/2012 1518   ALT 12 (L) 10/07/2016 1418   ALT 16 06/04/2012 1518   ALKPHOS 67 10/07/2016 1418   ALKPHOS 82 06/04/2012 1518   BILITOT 1.1 10/07/2016 1418   BILITOT 0.63 06/04/2012 1518   GFRNONAA 56 (L) 10/08/2016 0742   GFRAA >60 10/08/2016 9563    ASSESSMENT/PLAN: 67 year old female with a past medical history of GERD, adenomatous colon polyp, colonic diverticulosis, history of breast cancer status post chemotherapy and radiation, chronic cough who is seen in consult at the request of Dr. Annamaria Boots to evaluate chronic cough.   1.  GERD/chronic cough --I am not convinced that her cough, throat clearing is related to acid reflux.  She is on Dexilant 60 mg daily which normally is enough acid suppression to control reflux.  I have recommended that we proceed with barium esophagram with tablet to evaluate esophageal motility, rule out Zenker's diverticulum, rule out evidence of aspiration.  If unremarkable, I recommend we proceed with 24-hour pH and impedance testing/manometry to truly evaluate her acid reflux disease and determine if she is fully controlled on PPI.  I would also like to assess her symptom index to see if cough relates to reflux or not.  This objective study should help in treatment decisions rather than simply adding medications at this point.  2.  History of adenoma --small cecal adenoma in 2012.  She is due for surveillance colonoscopy at this time.  We discussed the risk, benefits and alternatives and she is agreeable and wishes to proceed.  We will need to contact Dr. Virgina Jock about holding her Xarelto 2 days before this test.  Will hold Xarelto to days prior to endoscopic procedures - will instruct when and how  to resume after procedure. Benefits and risks of procedure explained including risks of bleeding, perforation, infection, missed lesions, reactions to medications and possible need for hospitalization and surgery for complications. Additional rare but real risk of stroke or other vascular clotting events off Xarelto also explained and need to seek urgent help if any signs of these problems occur. Will communicate by phone or EMR with patient's  prescribing provider to confirm that holding Xarelto is reasonable in this case.      OV:FIEPP, Diane Knudsen, Md [address] [place], [place] [address]

## 2017-10-12 NOTE — Telephone Encounter (Signed)
   Diane Barker 12-Sep-1950 [id number]  Dear Dr. Virgina Jock:  We have scheduled the above named patient for a colonoscopy procedure. Our records show that (s)he is on anticoagulation therapy.  Please advise as to whether the patient may come off their therapy of Xarelto 2 days prior to their procedure which is scheduled for 12-07-17.  Please route your response to 12-07-17 or fax response to [phone].  Sincerely,    [facility]

## 2017-10-12 NOTE — Patient Instructions (Addendum)
If you are age 68 or older, your body mass index should be between 23-30. Your Body mass index is 41.62 kg/m. If this is out of the aforementioned range listed, please consider follow up with your Primary Care Provider.  If you are age 106 or younger, your body mass index should be between 19-25. Your Body mass index is 41.62 kg/m. If this is out of the aformentioned range listed, please consider follow up with your Primary Care Provider.   You have been scheduled for a colonoscopy. Please follow written instructions given to you at your visit today.  Please pick up your prep supplies at the pharmacy within the next 1-3 days. If you use inhalers (even only as needed), please bring them with you on the day of your procedure. Your physician has requested that you go to www.startemmi.com and enter the access code given to you at your visit today. This web site gives a general overview about your procedure. However, you should still follow specific instructions given to you by our office regarding your preparation for the procedure.  You have been scheduled for a Barium Esophogram at [facility] (1st floor of the hospital) on October 23, 2017 at 10:30am. Please arrive 15 minutes prior to your appointment for registration. Make certain not to have anything to eat or drink 3 hours prior to your test. If you need to reschedule for any reason, please contact radiology at [phone] to do so. __________________________________________________________________ A barium swallow is an examination that concentrates on views of the esophagus. This tends to be a double contrast exam (barium and two liquids which, when combined, create a gas to distend the wall of the oesophagus) or single contrast (non-ionic iodine based). The study is usually tailored to your symptoms so a good history is essential. Attention is paid during the study to the form, structure and configuration of the esophagus, looking for  functional disorders (such as aspiration, dysphagia, achalasia, motility and reflux) EXAMINATION You may be asked to change into a gown, depending on the type of swallow being performed. A radiologist and radiographer will perform the procedure. The radiologist will advise you of the type of contrast selected for your procedure and direct you during the exam. You will be asked to stand, sit or lie in several different positions and to hold a small amount of fluid in your mouth before being asked to swallow while the imaging is performed .In some instances you may be asked to swallow barium coated marshmallows to assess the motility of a solid food bolus. The exam can be recorded as a digital or video fluoroscopy procedure. POST PROCEDURE It will take 1-2 days for the barium to pass through your system. To facilitate this, it is important, unless otherwise directed, to increase your fluids for the next 24-48hrs and to resume your normal diet.  This test typically takes about 30 minutes to perform. _____________________________________________________________________________  Diane Barker have been scheduled for a 24 Hour PH Probe test at [facility] on Monday August 19th, 2019 at 12:30pm. Please arrive 30 minutes prior to your procedure for registration. You will need to go to outpatient registration (1st floor of the hospital) first. Make certain to bring your insurance cards as well as a complete list of medications.  Please remember the following:  1) Do not take any muscle relaxants, xanax (alprazolam) or ativan for 1 day prior to your     test as well as the day of the test.  2)  Nothing to eat or drink after 12:00 midnight on the night before your test.  3) Hold all diabetic medications/insulin the morning of the test. You may eat and take             your medications after the test.  4) For 7 days prior to your test do not take: Dexilant, Prevacid, Nexium, Protonix,    Aciphex, Zegerid,  Pantoprazole, Prilosec or omeprazole.  5) For 7 days prior to your test, do not take: Reglan, Tagamet, Zantac, Axid or Pepcid.  6) You MAY use an antacid such as Rolaids or Tums up to 12 hours prior to your test.  It will take at least 2 weeks to receive the results of this test from your physician.  ------------------------------------------  ABOUT ESOPHAGEAL MANOMETRY Esophageal manometry (muh-NOM-uh-tree) is a test that gauges how well your esophagus works. Your esophagus is the long, muscular tube that connects your throat to your stomach. Esophageal manometry measures the rhythmic muscle contractions (peristalsis) that occur in your esophagus when you swallow. Esophageal manometry also measures the coordination and force exerted by the muscles of your esophagus.  During esophageal manometry, a thin, flexible tube (catheter) that contains sensors is passed through your nose, down your esophagus and into your stomach. Esophageal manometry can be helpful in diagnosing some mostly uncommon disorders that affect your esophagus.  Why it's done Esophageal manometry is used to evaluate the movement (motility) of food through the esophagus and into the stomach. The test measures how well the circular bands of muscle (sphincters) at the top and bottom of your esophagus open and close, as well as the pressure, strength and pattern of the wave of esophageal muscle contractions that moves food along.  What you can expect Esophageal manometry is an outpatient procedure done without sedation. Most people tolerate it well. You may be asked to change into a hospital gown before the test starts.  During esophageal manometry  While you are sitting up, a member of your health care team sprays your throat with a numbing medication or puts numbing gel in your nose or both.  A catheter is guided through your nose into your esophagus. The catheter may be sheathed in a water-filled sleeve. It doesn't interfere with  your breathing. However, your eyes may water, and you may gag. You may have a slight nosebleed from irritation.  After the catheter is in place, you may be asked to lie on your back on an exam table, or you may be asked to remain seated.  You then swallow small sips of water. As you do, a computer connected to the catheter records the pressure, strength and pattern of your esophageal muscle contractions.  During the test, you'll be asked to breathe slowly and smoothly, remain as still as possible, and swallow only when you're asked to do so.  A member of your health care team may move the catheter down into your stomach while the catheter continues its measurements.  The catheter then is slowly withdrawn. The test usually lasts 20 to 30 minutes.  After esophageal manometry  When your esophageal manometry is complete, you may return to your normal activities  This test typically takes 30-45 minutes to complete. ___________________________________________________________________________ An esophageal pH test measures and records the pH in your esophagus to determine if you have gastroesophageal reflux disease (GERD). The test can also be done to determine the effectiveness of medications or surgical treatment for GERD. What is esophageal reflux? Esophageal reflux is a condition in  which stomach acid refluxes or moves back into the esophagus (the "food pipe" leading from the mouth to the stomach). How does the esophageal pH test work? A thin, small tube with an acid sensing device on the tip is gently passed through your nose, down the esophagus ("food tube"), and positioned about 2 inches above the lower esophageal sphincter. The tube is secured to the side of your face with clear tape. The end of the tube exiting from your nose is attached to a portable recorder that is worn on your belt or over your shoulder. The recorder has several buttons on it that you will press to mark certain events. A nurse  will review the monitoring instructions with you. Once the test has begun, what do I need to know and do? Activity: Follow your usual daily routine. Do not reduce or change your activities during the monitoring period. Doing so can make the monitoring results less useful.  Note: do not take a tub bath or shower; the equipment can't get wet.  Eating: Eat your regular meals at the usual times. If you do not eat during the monitoring period, your stomach will not produce acid as usual, and the test results will not be accurate. Eat at least 2 meals a day. Eat foods that tend to increase your symptoms (without making yourself miserable). Avoid snacking. Do not suck on hard candy or lozenges and do not chew gum during the monitoring period.  Lying down: Remain upright throughout the day. Do not lie down until you go to bed (unless napping or lying down during the day is part of your daily routine).  Medications: Continue to follow your doctor's advice regarding medications to avoid during the monitoring period.  Recording symptoms: Press the appropriate button on your recorder when symptoms occur (as discussed with the nurse).  Recording events: Record the time you start and stop eating and drinking (anything other than plain water). Record the time you lie down (even if just resting) and when you get back up. The nurse will explain this.  Unusual symptoms or side effects. If you think you may be experiencing any unusual symptoms or side effects, call your doctor.  You will return the next day to have the tube removed. The information on the recorder will be downloaded to a computer and the results will be analyzed.  After completion of the study Resume your normal diet and medications. Lozenges or hard candy may help ease any sore throat caused by the tube.    You will be contacted by our office prior to your procedure for directions on holding your Xarelto.  If you do not hear from our office 1 week  prior to your scheduled procedure, please call [phone] to discuss.    Thank you,  Dr. Hilarie Fredrickson

## 2017-10-13 MED ORDER — SUPREP BOWEL PREP KIT 17.5-3.13-1.6 GM/177ML PO SOLN: 1.0000 | ORAL | 0 refills | Status: DC

## 2017-10-13 NOTE — Addendum Note (Signed)
Addended by: Larina Bras on: 10/13/2017 10:07 AM   Modules accepted: Orders

## 2017-10-15 ENCOUNTER — Telehealth: Payer: Self-pay | Admitting: Internal Medicine

## 2017-10-15 NOTE — Telephone Encounter (Signed)
Patient states she does not want to do appt at [facility] on 8.19.19 with Dr.Nandigam and would like to cancel it. Patient states she will still do barium swallow. Dr.Pyrtle pt.

## 2017-10-15 NOTE — Telephone Encounter (Signed)
EM cancelled per pt request.

## 2017-10-15 NOTE — Telephone Encounter (Signed)
Faxed copy of anti coag letter to Dr. Keane Police office on 10-14-17

## 2017-10-16 NOTE — Telephone Encounter (Signed)
We have received faxed response from Dr Virgina Jock indicating, "ok to hold xarelto 2 days prior. Been 4 years since DVT."   I have contacted patient to advise that per Dr Virgina Jock, she may hold Xarelto 2 days prior to colonoscopy procedure. She verbalizes understanding of this.  See original faxed response under "media" tab in EPIC.

## 2017-10-23 ENCOUNTER — Ambulatory Visit (HOSPITAL_COMMUNITY): Payer: 59

## 2017-10-30 ENCOUNTER — Ambulatory Visit (HOSPITAL_COMMUNITY)
Admission: RE | Admit: 2017-10-30 | Discharge: 2017-10-30 | Disposition: A | Discharge status: Home / Self Care | Payer: 59 | Source: Ambulatory Visit | Attending: Internal Medicine | Admitting: Internal Medicine

## 2017-10-30 DIAGNOSIS — R05 Cough: Secondary | ICD-10-CM | POA: Insufficient documentation

## 2017-10-30 DIAGNOSIS — K219 Gastro-esophageal reflux disease without esophagitis: Secondary | ICD-10-CM | POA: Diagnosis not present

## 2017-10-30 DIAGNOSIS — R053 Chronic cough: Secondary | ICD-10-CM

## 2017-11-02 ENCOUNTER — Ambulatory Visit (HOSPITAL_COMMUNITY): Admit: 2017-11-02 | Payer: 59 | Admitting: Gastroenterology

## 2017-11-02 ENCOUNTER — Encounter (HOSPITAL_COMMUNITY): Payer: Self-pay

## 2017-11-02 SURGERY — MONITORING, ESOPHAGEAL PH, 24 HOUR

## 2017-11-12 ENCOUNTER — Encounter (HOSPITAL_COMMUNITY): Payer: 59

## 2017-12-03 ENCOUNTER — Other Ambulatory Visit (HOSPITAL_COMMUNITY): Payer: Self-pay

## 2017-12-04 ENCOUNTER — Ambulatory Visit (HOSPITAL_COMMUNITY)
Admission: RE | Admit: 2017-12-04 | Discharge: 2017-12-04 | Disposition: A | Discharge status: Home / Self Care | Payer: 59 | Source: Ambulatory Visit | Attending: Internal Medicine | Admitting: Internal Medicine

## 2017-12-04 DIAGNOSIS — M810 Age-related osteoporosis without current pathological fracture: Secondary | ICD-10-CM | POA: Diagnosis present

## 2017-12-04 MED ORDER — ZOLEDRONIC ACID 5 MG/100ML IV SOLN
INTRAVENOUS | Status: AC
  Filled 2017-12-04: qty 100

## 2017-12-04 MED ORDER — ZOLEDRONIC ACID 5 MG/100ML IV SOLN
5.0000 mg | Freq: Once | INTRAVENOUS | Status: AC
  Administered 2017-12-04: 5 mg via INTRAVENOUS

## 2017-12-07 ENCOUNTER — Encounter: Payer: 59 | Admitting: Internal Medicine

## 2017-12-28 ENCOUNTER — Telehealth: Payer: Self-pay | Admitting: Internal Medicine

## 2017-12-28 NOTE — Telephone Encounter (Signed)
Pt would like to speak with you regarding a test that she cancelled.

## 2017-12-28 NOTE — Telephone Encounter (Signed)
Pt called to reschedule colon. Previsit scheduled 01/15/18@2pm , and colon scheduled 02/01/18@3pm  in the [facility]. Pt aware of appt. States she coughed a lot yesterday and vomited some. Reports she coughed so much there was a little blood present. Discussed with her that if she does this again she would need to go to the ER to be evaluated, she verbalized understanding.

## 2018-01-08 ENCOUNTER — Ambulatory Visit (INDEPENDENT_AMBULATORY_CARE_PROVIDER_SITE_OTHER): Payer: 59 | Admitting: Internal Medicine

## 2018-01-08 ENCOUNTER — Encounter: Payer: Self-pay | Admitting: Internal Medicine

## 2018-01-08 DIAGNOSIS — K219 Gastro-esophageal reflux disease without esophagitis: Secondary | ICD-10-CM

## 2018-01-08 DIAGNOSIS — J42 Unspecified chronic bronchitis: Secondary | ICD-10-CM | POA: Diagnosis not present

## 2018-01-08 DIAGNOSIS — J440 Chronic obstructive pulmonary disease with acute lower respiratory infection: Secondary | ICD-10-CM

## 2018-01-08 DIAGNOSIS — J209 Acute bronchitis, unspecified: Secondary | ICD-10-CM | POA: Diagnosis not present

## 2018-01-08 LAB — PULMONARY FUNCTION TEST
DL/VA % PRED: 86 %
DL/VA: 4.3 ml/min/mmHg/L
DLCO unc % pred: 63 %
DLCO unc: 16.37 ml/min/mmHg
FEF 25-75 POST: 3.3 L/s
FEF 25-75 PRE: 2.7 L/s
FEF2575-%Change-Post: 21 %
FEF2575-%PRED-PRE: 131 %
FEF2575-%Pred-Post: 159 %
FEV1-%Change-Post: 5 %
FEV1-%PRED-PRE: 86 %
FEV1-%Pred-Post: 90 %
FEV1-PRE: 2.11 L
FEV1-Post: 2.21 L
FEV1FVC-%CHANGE-POST: 2 %
FEV1FVC-%Pred-Pre: 111 %
FEV6-%CHANGE-POST: 2 %
FEV6-%PRED-POST: 81 %
FEV6-%PRED-PRE: 80 %
FEV6-POST: 2.52 L
FEV6-PRE: 2.47 L
FEV6FVC-%PRED-POST: 104 %
FEV6FVC-%PRED-PRE: 104 %
FVC-%Change-Post: 2 %
FVC-%PRED-POST: 78 %
FVC-%Pred-Pre: 76 %
FVC-POST: 2.52 L
FVC-Pre: 2.47 L
POST FEV6/FVC RATIO: 100 %
PRE FEV1/FVC RATIO: 85 %
Post FEV1/FVC ratio: 88 %
Pre FEV6/FVC Ratio: 100 %
RV % pred: 65 %
RV: 1.45 L
TLC % pred: 75 %
TLC: 3.97 L

## 2018-01-08 MED ORDER — FLUTICASONE-UMECLIDIN-VILANT 100-62.5-25 MCG/INH IN AEPB: 1.0000 | INHALATION_SPRAY | Freq: Every day | RESPIRATORY_TRACT | 0 refills | Status: DC

## 2018-01-08 NOTE — Progress Notes (Signed)
Patient ID: Diane Barker, female    DOB: 11-25-1950, 67 y.o.   MRN: [id number]  HPI F never smoker followed for bronchitis with hx rhinitis, GERD, hx breast cancer, Hx DVT/ PE/ Xarelto. Office Spirometry 04/30/2015- WNL- FVC 2.75/87%, FEV1 2.29/93%, FEV1/FVC 0.83, FEF 25-75 percent 2.83/126%. PFT 01/08/2018- insignificant response to dilator, mild restriction, diffusion mildly reduced.  FVC 2.52/78%, FEV1 2.21/90%, ratio 1.88, FEF 25-75% 3.30/259%, TLC 75%, DLCO 63% ------------------------------------------------------------------------------  10/02/2017- 67 year old female never smoker followed for bronchitis, rhinitis, complicated by GERD, history breast cancer/XRT, history DVT/PE/Xarelto -----On 7/17 she had a coughing spell and at one point felt like what she was coughing up went down the wrong way and coughed for 2 hours after that. When she eats or drinks she coughs right after. Increased cough and SOB over the past month, especially with eating and drinking but does not recognize any penetration into the larynx.  2 days ago she got strangled on crumb,  then felt as if it went down the wrong way.  Cough very hard for 2 hours, producing some clear mucus.  Otherwise cough has been dry.  Throat is sore.  Some benefit from pro-air.  Some wheeze.  Nonexertional soreness left upper anterior chest wall aggravated by cough,  has seemed to radiate into left upper arm once or twice but is not exertional.  01/08/2018- 67 year old female never smoker followed for bronchitis, rhinitis, complicated by GERD, history breast cancer/XRT, history DVT/PE/Xarelto PFT today Obstructive Chronic Bronchitis: Pt had PFT today; Pt  notes she felt better with Symbicort inhaler and aerochamber.  ProAir HFA, Recent hard cough triggered tussive soreness left upper anterior chest wall and scapula.  Cough is now better.  Pain nonexertional. Feels that Dexilant is not controlling her reflux.  She is scheduled for pH probe but  says she cannot tolerate the idea of having something put down her nose. Main complaint is dyspnea on exertion, gradual and attributed to weight gain.  Gets no regular exercise.  Little wheeze. Barium swallow 10/30/2017-  Negative esophagram. CXR 10/02/2017- IMPRESSION: 1. Right upper lobe atelectatic changes and/or scarring again noted. 2.  Right-sided aortic arch again noted. PFT 01/08/2018- insignificant response to dilator, mild restriction, diffusion mildly reduced.  FVC 2.52/78%, FEV1 2.21/90%, ratio 1.88, FEF 25-75% 3.30/259%, TLC 75%, DLCO 63%  Review of Systems-See HPI   + = positive Constitutional:   No-   weight loss, night sweats, fevers, chills, fatigue, lassitude. HEENT:   +  headaches, No-difficulty swallowing, tooth/dental problems, +sore throat,        sneezing, itching, ear ache, nasal congestion, +post nasal drip,  CV:  +atypical chest pain, orthopnea, PND, swelling in lower extremities, anasarca,  dizziness, palpitations Resp: +shortness of breath with exertion or at rest.              + productive cough,  + non-productive cough,  No- coughing up of blood.           change in color of mucus.  No- wheezing.   Skin: No-   rash or lesions. GI:  +  heartburn, indigestion, abdominal pain, +nausea, vomiting,  GU: MS:  No-   joint pain or swelling.   Neuro-     nothing unusual Psych:  No- change in mood or affect. No depression or anxiety.  No memory loss.  Objective:  OBJ- Physical Exam General- Alert, Oriented, Affect-appropriate/ calm, Distress- none acute, + overweight Skin- rash-none, lesions- none, excoriation+  Lymphadenopathy- none Head- atraumatic  Eyes- Gross vision intact, PERRLA, conjunctivae and secretions clear            Ears- Hearing, canals-normal            Nose- + sniffing, no-Septal dev, mucus, polyps, erosion, perforation             Throat- Mallampati IV , mucosa clear , drainage- none, tonsils- atrophic, + throat clearing Neck- flexible  , trachea midline, no stridor , thyroid nl, carotid no bruit Chest - symmetrical excursion , unlabored           Heart/CV- RRR , no murmur , no gallop  , no rub, nl s1 s2                           - JVD- none , edema +1, stasis changes- none, varices- none           Lung- + coarse breath sounds, unlabored, wheeze- none, cough -none,                                           dullness-none, rub- none           Chest wall-  Abd-  Br/ Gen/ Rectal- Not done, not indicated Extrem- cyanosis- none, clubbing, none, atrophy- none, strength- nl, + excoriated stasis dermatitis Neuro- grossly intact to observation

## 2018-01-08 NOTE — Progress Notes (Signed)
PFT completed today. 01/08/18

## 2018-01-08 NOTE — Patient Instructions (Signed)
To lose weight, you need to eat less and move more. Try avoiding "white" foods- like  Bread, pasta, chips, potatoes.  Sample Trelegy inhaler     Inhale 1 puff, then rinse mouth, once daily. See if shortness of breath, cough and stamina are any better.   Please call as needed

## 2018-01-15 ENCOUNTER — Other Ambulatory Visit: Payer: Self-pay

## 2018-01-15 ENCOUNTER — Observation Stay (HOSPITAL_COMMUNITY): Payer: 59

## 2018-01-15 ENCOUNTER — Inpatient Hospital Stay (HOSPITAL_COMMUNITY)
Admission: EM | Admit: 2018-01-15 | Discharge: 2018-01-19 | DRG: 287 | Disposition: A | Discharge status: Home / Self Care | Payer: 59 | Attending: Family Medicine | Admitting: Family Medicine

## 2018-01-15 ENCOUNTER — Emergency Department (HOSPITAL_COMMUNITY): Payer: 59

## 2018-01-15 ENCOUNTER — Encounter (HOSPITAL_COMMUNITY): Payer: Self-pay | Admitting: General Practice

## 2018-01-15 DIAGNOSIS — Z79899 Other long term (current) drug therapy: Secondary | ICD-10-CM

## 2018-01-15 DIAGNOSIS — I25110 Atherosclerotic heart disease of native coronary artery with unstable angina pectoris: Principal | ICD-10-CM | POA: Diagnosis present

## 2018-01-15 DIAGNOSIS — Z66 Do not resuscitate: Secondary | ICD-10-CM | POA: Diagnosis present

## 2018-01-15 DIAGNOSIS — Z6841 Body Mass Index (BMI) 40.0 and over, adult: Secondary | ICD-10-CM

## 2018-01-15 DIAGNOSIS — R0609 Other forms of dyspnea: Secondary | ICD-10-CM | POA: Diagnosis not present

## 2018-01-15 DIAGNOSIS — I2699 Other pulmonary embolism without acute cor pulmonale: Secondary | ICD-10-CM

## 2018-01-15 DIAGNOSIS — Z880 Allergy status to penicillin: Secondary | ICD-10-CM

## 2018-01-15 DIAGNOSIS — Z853 Personal history of malignant neoplasm of breast: Secondary | ICD-10-CM | POA: Diagnosis not present

## 2018-01-15 DIAGNOSIS — Z86718 Personal history of other venous thrombosis and embolism: Secondary | ICD-10-CM

## 2018-01-15 DIAGNOSIS — I5032 Chronic diastolic (congestive) heart failure: Secondary | ICD-10-CM | POA: Diagnosis present

## 2018-01-15 DIAGNOSIS — I5030 Unspecified diastolic (congestive) heart failure: Secondary | ICD-10-CM | POA: Diagnosis present

## 2018-01-15 DIAGNOSIS — R079 Chest pain, unspecified: Secondary | ICD-10-CM | POA: Diagnosis not present

## 2018-01-15 DIAGNOSIS — Z7901 Long term (current) use of anticoagulants: Secondary | ICD-10-CM

## 2018-01-15 DIAGNOSIS — J440 Chronic obstructive pulmonary disease with acute lower respiratory infection: Secondary | ICD-10-CM | POA: Diagnosis present

## 2018-01-15 DIAGNOSIS — R0789 Other chest pain: Secondary | ICD-10-CM | POA: Diagnosis not present

## 2018-01-15 DIAGNOSIS — I110 Hypertensive heart disease with heart failure: Secondary | ICD-10-CM | POA: Diagnosis present

## 2018-01-15 DIAGNOSIS — K219 Gastro-esophageal reflux disease without esophagitis: Secondary | ICD-10-CM | POA: Diagnosis present

## 2018-01-15 DIAGNOSIS — Z9221 Personal history of antineoplastic chemotherapy: Secondary | ICD-10-CM

## 2018-01-15 DIAGNOSIS — R531 Weakness: Secondary | ICD-10-CM | POA: Diagnosis present

## 2018-01-15 DIAGNOSIS — J209 Acute bronchitis, unspecified: Secondary | ICD-10-CM | POA: Diagnosis present

## 2018-01-15 DIAGNOSIS — E785 Hyperlipidemia, unspecified: Secondary | ICD-10-CM | POA: Diagnosis present

## 2018-01-15 DIAGNOSIS — Z885 Allergy status to narcotic agent status: Secondary | ICD-10-CM

## 2018-01-15 DIAGNOSIS — Z888 Allergy status to other drugs, medicaments and biological substances status: Secondary | ICD-10-CM

## 2018-01-15 DIAGNOSIS — I200 Unstable angina: Secondary | ICD-10-CM

## 2018-01-15 DIAGNOSIS — Z923 Personal history of irradiation: Secondary | ICD-10-CM

## 2018-01-15 DIAGNOSIS — F329 Major depressive disorder, single episode, unspecified: Secondary | ICD-10-CM | POA: Diagnosis present

## 2018-01-15 DIAGNOSIS — Z7951 Long term (current) use of inhaled steroids: Secondary | ICD-10-CM

## 2018-01-15 DIAGNOSIS — Z86711 Personal history of pulmonary embolism: Secondary | ICD-10-CM

## 2018-01-15 HISTORY — DX: Other pulmonary embolism without acute cor pulmonale: I26.99

## 2018-01-15 HISTORY — DX: Unspecified osteoarthritis, unspecified site: M19.90

## 2018-01-15 HISTORY — DX: Malignant neoplasm of unspecified site of right female breast: C50.911

## 2018-01-15 HISTORY — DX: Acute embolism and thrombosis of unspecified deep veins of unspecified lower extremity: I82.409

## 2018-01-15 HISTORY — DX: Anxiety disorder, unspecified: F41.9

## 2018-01-15 HISTORY — DX: Low back pain, unspecified: M54.50

## 2018-01-15 HISTORY — DX: Essential (primary) hypertension: I10

## 2018-01-15 HISTORY — DX: Low back pain: M54.5

## 2018-01-15 HISTORY — DX: Unspecified asthma, uncomplicated: J45.909

## 2018-01-15 HISTORY — DX: Spondylolisthesis, site unspecified: M43.10

## 2018-01-15 HISTORY — DX: Pure hypercholesterolemia, unspecified: E78.00

## 2018-01-15 HISTORY — DX: Other chronic pain: G89.29

## 2018-01-15 LAB — TROPONIN I
Troponin I: <0.03 ng/mL (ref <0.03)
Troponin I: <0.03 ng/mL (ref <0.03)

## 2018-01-15 LAB — BASIC METABOLIC PANEL
Anion gap: 2 — ABNORMAL LOW (ref 5–15)
BUN: 14 mg/dL (ref 8–23)
CO2: 24 mmol/L (ref 22–32)
CREATININE: 0.95 mg/dL (ref 0.44–1.00)
Calcium: 8.3 mg/dL — ABNORMAL LOW (ref 8.9–10.3)
Chloride: 113 mmol/L — ABNORMAL HIGH (ref 98–111)
GFR calc Af Amer: >60 mL/min (ref >60)
Glucose, Bld: 98 mg/dL (ref 70–99)
POTASSIUM: 3.6 mmol/L (ref 3.5–5.1)
Sodium: 139 mmol/L (ref 135–145)

## 2018-01-15 LAB — I-STAT TROPONIN, ED: TROPONIN I, POC: 0.01 ng/mL (ref 0.00–0.08)

## 2018-01-15 LAB — CBC
HCT: 39.8 % (ref 36.0–46.0)
Hemoglobin: 12.9 g/dL (ref 12.0–15.0)
MCH: 32.6 pg (ref 26.0–34.0)
MCHC: 32.4 g/dL (ref 30.0–36.0)
MCV: 100.5 fL — ABNORMAL HIGH (ref 80.0–100.0)
PLATELETS: 206 10*3/uL (ref 150–400)
RBC: 3.96 MIL/uL (ref 3.87–5.11)
RDW: 12.4 % (ref 11.5–15.5)
WBC: 4.6 10*3/uL (ref 4.0–10.5)
nRBC: 0 % (ref 0.0–0.2)

## 2018-01-15 LAB — D-DIMER, QUANTITATIVE: D-Dimer, Quant: <0.27 ug/mL-FEU (ref 0.00–0.50)

## 2018-01-15 LAB — TSH: TSH: 2.974 u[IU]/mL (ref 0.350–4.500)

## 2018-01-15 MED ORDER — ATORVASTATIN CALCIUM 40 MG PO TABS
40.0000 mg | ORAL_TABLET | Freq: Every day | ORAL | Status: DC
  Administered 2018-01-15 – 2018-01-17 (×3): 40 mg via ORAL
  Filled 2018-01-15 (×4): qty 1

## 2018-01-15 MED ORDER — HYDROCODONE-ACETAMINOPHEN 5-325 MG PO TABS: 1.0000 | ORAL_TABLET | ORAL | Status: DC | PRN

## 2018-01-15 MED ORDER — ESCITALOPRAM OXALATE 10 MG PO TABS
10.0000 mg | ORAL_TABLET | Freq: Every day | ORAL | Status: DC
  Administered 2018-01-16 – 2018-01-19 (×5): 10 mg via ORAL
  Filled 2018-01-15 (×4): qty 1

## 2018-01-15 MED ORDER — GUAIFENESIN ER 600 MG PO TB12
600.0000 mg | ORAL_TABLET | Freq: Two times a day (BID) | ORAL | Status: DC
  Administered 2018-01-15 – 2018-01-19 (×9): 600 mg via ORAL
  Filled 2018-01-15 (×8): qty 1

## 2018-01-15 MED ORDER — SODIUM CHLORIDE 0.9 % IV SOLN
INTRAVENOUS | Status: AC
  Administered 2018-01-15 (×2): via INTRAVENOUS

## 2018-01-15 MED ORDER — ASPIRIN 81 MG PO CHEW
324.0000 mg | CHEWABLE_TABLET | Freq: Once | ORAL | Status: AC
  Administered 2018-01-15: 324 mg via ORAL
  Filled 2018-01-15: qty 4

## 2018-01-15 MED ORDER — AMLODIPINE BESYLATE 5 MG PO TABS
5.0000 mg | ORAL_TABLET | Freq: Every day | ORAL | Status: DC
  Administered 2018-01-16 – 2018-01-18 (×3): 5 mg via ORAL
  Filled 2018-01-15 (×3): qty 1

## 2018-01-15 MED ORDER — ASPIRIN EC 81 MG PO TBEC
81.0000 mg | DELAYED_RELEASE_TABLET | Freq: Every day | ORAL | Status: DC
  Administered 2018-01-16 – 2018-01-19 (×5): 81 mg via ORAL
  Filled 2018-01-15 (×4): qty 1

## 2018-01-15 MED ORDER — PANTOPRAZOLE SODIUM 40 MG PO TBEC
40.0000 mg | DELAYED_RELEASE_TABLET | Freq: Every day | ORAL | Status: DC
  Administered 2018-01-16 – 2018-01-19 (×5): 40 mg via ORAL
  Filled 2018-01-15 (×4): qty 1

## 2018-01-15 MED ORDER — ONDANSETRON HCL 4 MG/2ML IJ SOLN: 4.0000 mg | Freq: Four times a day (QID) | INTRAMUSCULAR | Status: DC | PRN

## 2018-01-15 MED ORDER — LEVALBUTEROL HCL 0.63 MG/3ML IN NEBU: 0.6300 mg | INHALATION_SOLUTION | Freq: Four times a day (QID) | RESPIRATORY_TRACT | Status: DC | PRN

## 2018-01-15 MED ORDER — RIVAROXABAN 20 MG PO TABS
20.0000 mg | ORAL_TABLET | Freq: Every day | ORAL | Status: DC
  Administered 2018-01-15 – 2018-01-16 (×2): 20 mg via ORAL
  Filled 2018-01-15 (×2): qty 1

## 2018-01-15 MED ORDER — POLYETHYLENE GLYCOL 3350 17 G PO PACK: 17.0000 g | PACK | Freq: Every day | ORAL | Status: DC | PRN

## 2018-01-15 MED ORDER — ONDANSETRON HCL 4 MG PO TABS: 4.0000 mg | ORAL_TABLET | Freq: Four times a day (QID) | ORAL | Status: DC | PRN

## 2018-01-15 MED ORDER — BISACODYL 10 MG RE SUPP: 10.0000 mg | Freq: Every day | RECTAL | Status: DC | PRN

## 2018-01-15 MED ORDER — ACETAMINOPHEN 325 MG PO TABS
650.0000 mg | ORAL_TABLET | Freq: Four times a day (QID) | ORAL | Status: DC | PRN
  Filled 2018-01-15: qty 2

## 2018-01-15 MED ORDER — ACETAMINOPHEN 650 MG RE SUPP: 650.0000 mg | Freq: Four times a day (QID) | RECTAL | Status: DC | PRN

## 2018-01-15 NOTE — ED Notes (Signed)
Attempted report x1, RN not available

## 2018-01-15 NOTE — Progress Notes (Signed)
Multiple scabs noted 4 spots left arm, 3 spots R arm,,scab on right and left leg. Some areas are reddened and itchy per pt.

## 2018-01-15 NOTE — ED Notes (Signed)
Pt ambulated to bathroom 

## 2018-01-15 NOTE — ED Notes (Signed)
ED TO INPATIENT HANDOFF REPORT  Name/Age/Gender Diane Barker 67 y.o. female  Code Status Code Status History    Date Active Date Inactive Code Status Order ID Comments User Context   10/07/2016 1958 10/09/2016 1824 Full Code [id number]  Diane Costa, MD ED   06/21/2013 2010 06/22/2013 1744 Full Code [id number]  Diane Hatchet, MD Inpatient      Home/SNF/Other Home  Chief Complaint cp  Level of Care/Admitting Diagnosis ED Disposition    ED Disposition Condition [place]: [place] [100100]  Level of Care: Stepdown [14]  I expect the patient will be discharged within 24 hours: Yes  LOW acuity---Tx typically complete <24 hrs---ACUTE conditions typically can be evaluated <24 hours---LABS likely to return to acceptable levels <24 hours---IS near functional baseline---EXPECTED to return to current living arrangement---NOT newly hypoxic: Does not meet criteria for 5C-Observation unit  Diagnosis: Chest pain [829937]  Admitting Physician: Diane Barker [[id number]]  Attending Physician: Diane Barker [[id number]]  PT Class (Do Not Modify): Observation [104]  PT Acc Code (Do Not Modify): Observation [10022]       Medical History Past Medical History:  Diagnosis Date  . Arthritis   . Breast cancer ([place]) 2000   Rt lumpectomy, Chemo/XRT/rt axillary node   . Depression   . Diverticulosis   . GERD (gastroesophageal reflux disease)   . Hx of adenomatous colonic polyps   . Low back pain   . Obesity   . Personal history of chemotherapy   . Personal history of radiation therapy     Allergies Allergies  Allergen Reactions  . Erythromycin Other (See Comments)    REACTION: stomach cramps  . Morphine Other (See Comments)    REACTION: Hallucinations  . Penicillins Other (See Comments)    REACTION: unkkown, was a child  . Relafen [Nabumetone] Nausea Only    IV Location/Drains/Wounds Patient Lines/Drains/Airways Status   Active  Line/Drains/Airways    None          Labs/Imaging Results for orders placed or performed during the hospital encounter of 01/15/18 (from the past 48 hour(s))  I-stat troponin, ED     Status: None   Collection Time: 01/15/18 12:01 PM  Result Value Ref Range   Troponin i, poc 0.01 0.00 - 0.08 ng/mL   Comment 3            Comment: Due to the release kinetics of cTnI, a negative result within the first hours of the onset of symptoms does not rule out myocardial infarction with certainty. If myocardial infarction is still suspected, repeat the test at appropriate intervals.   Basic metabolic panel     Status: Abnormal   Collection Time: 01/15/18 12:11 PM  Result Value Ref Range   Sodium 139 135 - 145 mmol/L   Potassium 3.6 3.5 - 5.1 mmol/L   Chloride 113 (H) 98 - 111 mmol/L   CO2 24 22 - 32 mmol/L   Glucose, Bld 98 70 - 99 mg/dL   BUN 14 8 - 23 mg/dL   Creatinine, Ser 0.95 0.44 - 1.00 mg/dL   Calcium 8.3 (L) 8.9 - 10.3 mg/dL   GFR calc non Af Amer >60 >60 mL/min   GFR calc Af Amer >60 >60 mL/min    Comment: (NOTE) The eGFR has been calculated using the CKD EPI equation. This calculation has not been validated in all clinical situations. eGFR's persistently <60 mL/min signify possible Chronic Kidney Disease.    Anion gap  2 (L) 5 - 15    Comment: REPEATED TO VERIFY Performed at [facility], [place] [address] [place], [place] [address]   CBC     Status: Abnormal   Collection Time: 01/15/18 12:11 PM  Result Value Ref Range   WBC 4.6 4.0 - 10.5 K/uL   RBC 3.96 3.87 - 5.11 MIL/uL   Hemoglobin 12.9 12.0 - 15.0 g/dL   HCT 39.8 36.0 - 46.0 %   MCV 100.5 (H) 80.0 - 100.0 fL   MCH 32.6 26.0 - 34.0 pg   MCHC 32.4 30.0 - 36.0 g/dL   RDW 12.4 11.5 - 15.5 %   Platelets 206 150 - 400 K/uL   nRBC 0.0 0.0 - 0.2 %    Comment: Performed at [facility], [place] [address], [place], [place] [address]  D-dimer, quantitative     Status: None   Collection Time: 01/15/18 12:37 PM   Result Value Ref Range   D-Dimer, Quant <0.27 0.00 - 0.50 ug/mL-FEU    Comment: (NOTE) At the manufacturer cut-off of 0.50 ug/mL FEU, this assay has been documented to exclude PE with a sensitivity and negative predictive value of 97 to 99%.  At this time, this assay has not been approved by the FDA to exclude DVT/VTE. Results should be correlated with clinical presentation. Performed at [facility], [place] [address], [place], [place] [address]    Dg Chest Port 1 View  Result Date: 01/15/2018 CLINICAL DATA:  Chest pain.  Cough. EXAM: PORTABLE CHEST 1 VIEW COMPARISON:  Radiographs of October 02, 2017. FINDINGS: Stable cardiomegaly. No pneumothorax or pleural effusion is noted. Both lungs are clear. The visualized skeletal structures are unremarkable. IMPRESSION: No acute cardiopulmonary abnormality seen. Electronically Signed   By: Marijo Conception, M.D.   On: 01/15/2018 13:39    Pending Labs Unresulted Labs (From admission, onward)    Start     Ordered   01/15/18 1458  Troponin I (q 6hr x 3)  Now then every 6 hours,   R     01/15/18 1457          Vitals/Pain Today's Vitals   01/15/18 1135 01/15/18 1135 01/15/18 1238 01/15/18 1430  BP: 137/73   (!) 149/82  Pulse: 62   (!) 48  Resp: 16   (!) 26  Temp: 98 F (36.7 C)     TempSrc: Oral     SpO2: 98%   96%  Weight:      Height:      PainSc:  0-No pain 0-No pain     Isolation Precautions No active isolations  Medications Medications  aspirin chewable tablet 324 mg (324 mg Oral Given 01/15/18 1410)    Mobility walks

## 2018-01-15 NOTE — ED Triage Notes (Signed)
Pt brought in by [facility] from home. Pt complains of left sided chest pain that radiates to the left arm. Pt has a hx of asthma and DVTs. BP 137/82, HR 62, RR 14, 98% RA

## 2018-01-15 NOTE — ED Provider Notes (Addendum)
[facility] EMERGENCY DEPARTMENT Provider Note   CSN: [id number] Arrival date & time: 01/15/18  1127     History   Chief Complaint Chief Complaint  Patient presents with  . Chest Pain    HPI Diane Barker is a 67 y.o. female.  67 year old female with prior medical history as detailed below presents for evaluation of reported chest pain.  Patient reports that she had an episode of left-sided chest discomfort yesterday afternoon.  This pain was persistent over the course of the afternoon with slight improvement.  She was able to sleep overnight without difficulty.  She woke this morning had recurrent left-sided chest discomfort and decided to come to the ED for evaluation.  She denies prior history of chest discomfort.  She denies prior history of CAD.  She does report a prior cardiac catheterization that was performed approximately 20 years ago (and was normal per report).  She denies current chest pain.  She denies current shortness of breath.  She does have a prior history of PE.  Since her symptoms today are not consistent with her prior symptoms of PE.  She has been compliant with her Xarelto.  She denies recent missed doses of Xarelto at home.  The history is provided by the patient and medical records.  Chest Pain   This is a new problem. The current episode started yesterday. The problem occurs rarely. The problem has been gradually improving. The pain is associated with rest. Pain location: left anterior chest  The pain is moderate. The quality of the pain is described as heavy and pressure-like. The pain radiates to the left shoulder. Pertinent negatives include no diaphoresis and no shortness of breath. She has tried nothing for the symptoms.    Past Medical History:  Diagnosis Date  . Arthritis   . Breast cancer ([place]) 2000   Rt lumpectomy, Chemo/XRT/rt axillary node   . Depression   . Diverticulosis   . GERD (gastroesophageal reflux disease)   . Hx of  adenomatous colonic polyps   . Low back pain   . Obesity   . Personal history of chemotherapy   . Personal history of radiation therapy     Patient Active Problem List   Diagnosis Date Noted  . Primary osteoarthritis of both knees 07/29/2017  . Cellulitis of right arm 10/07/2016  . Sepsis ([place]) 10/07/2016  . Chest pain 10/07/2016  . Dyspnea on exertion 05/24/2015  . DVT, lower extremity ([place]) 05/09/2014  . Right calf pain 06/21/2013  . Pulmonary embolism ([place]) 06/21/2013  . Dysphagia 06/27/2011  . Cough 06/27/2011  . CHEST PAIN 01/18/2009  . RHINITIS 04/13/2007  . Obstructive chronic bronchitis with acute bronchitis ([place]) 04/13/2007  . GERD 04/13/2007  . Depression 01/21/2007  . OSTEOARTHRITIS 01/21/2007  . LOW BACK PAIN 01/21/2007  . BREAST CANCER, HX OF 01/21/2007    Past Surgical History:  Procedure Laterality Date  . BREAST BIOPSY    . BREAST LUMPECTOMY Right    for CA txed w/ chemo and radiation, right  . TUBAL LIGATION    . UMBILICAL HERNIA REPAIR  1983     OB History   None      Home Medications    Prior to Admission medications   Medication Sig Start Date End Date Taking? Authorizing Provider  calcium carbonate (OS-CAL - DOSED IN MG OF ELEMENTAL CALCIUM) 1250 (500 Ca) MG tablet Take 600 mg by mouth.    [provider]  Cholecalciferol (VITAMIN D) 2000  UNITS CAPS Take 4,000 Units by mouth daily.     [provider]  clotrimazole (LOTRIMIN) 1 % cream APPLY THIN COAT EXTERNALLY TO SKIN TWICE A DAY X 2 WKS, MAY REPEAT X 2 WKS 12/14/17   [provider]  dexlansoprazole (DEXILANT) 60 MG capsule Take 60 mg by mouth daily.    [provider]  escitalopram (LEXAPRO) 10 MG tablet Take 10 mg by mouth.    [provider]  Fluticasone-Umeclidin-Vilant (TRELEGY ELLIPTA) 100-62.5-25 MCG/INH AEPB Inhale 1 puff into the lungs daily. 01/08/18   Deneise Lever, MD  PROAIR HFA 108 [phone] Base) MCG/ACT inhaler TAKE 2 PUFFS BY MOUTH  EVERY 4 HOURS AS NEEDED 09/06/17   [provider]  vitamin B-12 (CYANOCOBALAMIN) 500 MCG tablet Take 500 mcg by mouth daily.    [provider]  XARELTO 20 MG TABS tablet Take 1 tablet by mouth daily. 04/10/14   [provider]    Family History Family History  Problem Relation Age of Onset  . Asthma Mother   . COPD Mother   . Heart attack Mother   . Diabetes Mother   . Lung cancer Father   . Asthma Daughter   . Kidney failure Brother   . Diabetes Brother        x 2  . Heart attack Brother   . Breast cancer Maternal Aunt   . Colon cancer Paternal Aunt   . Diabetes Daughter        x 2  . Hypertension Brother     Social History Social History   Tobacco Use  . Smoking status: Never Smoker  . Smokeless tobacco: Never Used  Substance Use Topics  . Alcohol use: No    Alcohol/week: 0.0 standard drinks  . Drug use: No     Allergies   Erythromycin; Morphine; Penicillins; and Relafen [nabumetone]   Review of Systems Review of Systems  Constitutional: Negative for diaphoresis.  Respiratory: Negative for shortness of breath.   Cardiovascular: Positive for chest pain.  All other systems reviewed and are negative.    Physical Exam Updated Vital Signs BP 137/73 (BP Location: Left Arm)   Pulse 62   Temp 98 F (36.7 C) (Oral)   Resp 16   Ht 5\' 5"  (1.651 m)   Wt 118.8 kg   SpO2 98%   BMI 43.60 kg/m   Physical Exam  Constitutional: She is oriented to person, place, and time. She appears well-developed and well-nourished. No distress.  HENT:  Head: Normocephalic and atraumatic.  Mouth/Throat: Oropharynx is clear and moist.  Eyes: Pupils are equal, round, and reactive to light. Conjunctivae and EOM are normal.  Neck: Normal range of motion. Neck supple.  Cardiovascular: Normal rate, regular rhythm and normal heart sounds.  Pulmonary/Chest: Effort normal and breath sounds normal. No respiratory distress.  Abdominal: Soft. She exhibits no  distension. There is no tenderness.  Musculoskeletal: Normal range of motion. She exhibits no edema or deformity.  Neurological: She is alert and oriented to person, place, and time.  Skin: Skin is warm and dry.  Psychiatric: She has a normal mood and affect.  Nursing note and vitals reviewed.    ED Treatments / Results  Labs (all labs ordered are listed, but only abnormal results are displayed) Labs Reviewed  BASIC METABOLIC PANEL - Abnormal; Notable for the following components:      Result Value   Chloride 113 (*)    Calcium 8.3 (*)    Anion gap  2 (*)    All other components within normal limits  CBC - Abnormal; Notable for the following components:   MCV 100.5 (*)    All other components within normal limits  D-DIMER, QUANTITATIVE (NOT AT [facility])  I-STAT TROPONIN, ED    EKG EKG Interpretation  Date/Time:  Friday January 15 2018 11:35:14 EDT Ventricular Rate:  60 PR Interval:    QRS Duration: 109 QT Interval:  431 QTC Calculation: 431 R Axis:   -19 Text Interpretation:  Sinus rhythm Borderline left axis deviation Low voltage, precordial leads Abnormal R-wave progression, early transition Borderline T wave abnormalities Confirmed by Dene Gentry [phone]) on 01/15/2018 11:57:23 AM   Radiology Dg Chest Port 1 View  Result Date: 01/15/2018 CLINICAL DATA:  Chest pain.  Cough. EXAM: PORTABLE CHEST 1 VIEW COMPARISON:  Radiographs of October 02, 2017. FINDINGS: Stable cardiomegaly. No pneumothorax or pleural effusion is noted. Both lungs are clear. The visualized skeletal structures are unremarkable. IMPRESSION: No acute cardiopulmonary abnormality seen. Electronically Signed   By: Marijo Conception, M.D.   On: 01/15/2018 13:39    Procedures Procedures (including critical care time)  Medications Ordered in ED Medications - No data to display   Initial Impression / Assessment and Plan / ED Course  I have reviewed the triage vital signs and the nursing notes.  Pertinent labs  & imaging results that were available during my care of the patient were reviewed by me and considered in my medical decision making (see chart for details).     MDM  Screen complete  She is presenting for evaluation of chest pain.  Initial EKG is without evidence of acute ischemia.  Initial troponin is negative.  Patient with a prior history of DVT/PE.  She is compliant with Xarelto.  D-dimer today is negative.  However, given the patient's presentation I feel that it would be prudent to admit the patient for ACS rule out.  Hospitalist service is aware of case and will evaluate for admission.   Final Clinical Impressions(s) / ED Diagnoses   Final diagnoses:  Chest pain, unspecified type    ED Discharge Orders    None       Valarie Merino, MD 01/15/18 1413    Valarie Merino, MD 01/15/18 [phone]

## 2018-01-15 NOTE — Assessment & Plan Note (Signed)
She had a recent stretch of hard coughing associated with tussive chest wall pain pattern affecting left upper anterior chest and scapula, now much improved.  PFT today does not show significant obstructive component or response to bronchodilator.  CXR has not shown significant interstitial disease. Plan-sample Trelegy for symptomatic trial

## 2018-01-15 NOTE — ED Notes (Signed)
Pt states she is currently taking Xarelto

## 2018-01-15 NOTE — H&P (Signed)
Diane Barker EGB:[id number] DOB: 1951-02-10 DOA: 01/15/2018     PCP: Shon Baton, MD   Outpatient Specialists:  Pulmonary   Dr.Young   Patient arrived to ER on 01/15/18 at 1127  Patient coming from: home Lives With family    Chief Complaint:  Chief Complaint  Patient presents with  . Chest Pain    HPI: Diane Barker is a 67 y.o. female with medical history significant of CAD,   chronic bronchitis/COPD, GERD, hx breast cancer, Hx DVT/ PE on Xarelto. CHF with grade 1 diastolic CHF    Presented with   Chest pain left sided started yesterday when she walked from Kitchen to Living room was severe radiated to left arm she was nauseated diaphoretic short of breath. Chest pain lasted only 5 min but heaviness persisted.She went to visit her daughter. She did not want go to the doctor.  Family states she was slurring her speech but she denies it she said she was just crying but she did feel weak unsure if was on one side only.   She gives out of breath a lot with minimal exertion for the past 1 month,    She have had a cough for 3 months  Regarding pertinent Chronic problems: last cardiac cath was in 1994 was WNL   While in ER:  Currently states she is back to her baseline but still has mild heaviness on the left.   The following Work up has been ordered so far:  Orders Placed This Encounter  Procedures  . DG Chest Port 1 View  . Basic metabolic panel  . CBC  . D-dimer, quantitative  . Cardiac monitoring  . Sal[place] Lock IV, Maintain IV access  . Consult to hospitalist  . Pulse oximetry, continuous  . I-stat troponin, ED  . EKG 12-Lead  . ED EKG within 10 minutes      Following Medications were ordered in ER: Medications  aspirin chewable tablet 324 mg (has no administration in time range)    Significant initial  Findings: Abnormal Labs Reviewed  BASIC METABOLIC PANEL - Abnormal; Notable for the following components:      Result Value   Chloride 113 (*)    Calcium 8.3 (*)    Anion gap 2 (*)    All other components within normal limits  CBC - Abnormal; Notable for the following components:   MCV 100.5 (*)    All other components within normal limits     Na 139 K 3.6  Cr  * stable,  Up from baseline see below Lab Results  Component Value Date   CREATININE 0.95 01/15/2018   CREATININE 1.02 (H) 10/08/2016   CREATININE 1.04 (H) 10/07/2016      WBC  4.6  HG/HCT   stable,       Component Value Date/Time   HGB 12.9 01/15/2018 1211   HGB 13.4 06/04/2012 1518   HCT 39.8 01/15/2018 1211   HCT 38.3 06/04/2012 1518       Troponin (Point of Care Test) Recent Labs    01/15/18 1201  TROPIPOC 0.01       BNP (last 3 results) No results for input(s): BNP in the last 8760 hours.  ProBNP (last 3 results) No results for input(s): PROBNP in the last 8760 hours.  Lactic Acid, Venous    Component Value Date/Time   LATICACIDVEN 1.5 10/07/2016 2046      UA  not ordered   CT HEAD ordered  CXR -  NON acute     ECG:  Personally reviewed by me showing: HR : 60  Rhythm:  NSR no evidence of ischemic changes QTC 431       ED Triage Vitals  Enc Vitals Group     BP 01/15/18 1135 137/73     Pulse Rate 01/15/18 1135 62     Resp 01/15/18 1135 16     Temp 01/15/18 1135 98 F (36.7 C)     Temp Source 01/15/18 1135 Oral     SpO2 01/15/18 1135 98 %     Weight 01/15/18 1131 262 lb (118.8 kg)     Height 01/15/18 1131 5\' 5"  (1.651 m)     Head Circumference --      Peak Flow --      Pain Score 01/15/18 1131 0     Pain Loc --      Pain Edu? --      Excl. in [place]? --   TMAX(24)@       Latest  Blood pressure 137/73, pulse 62, temperature 98 F (36.7 C), temperature source Oral, resp. rate 16, height 5\' 5"  (1.651 m), weight 118.8 kg, SpO2 98 %.    Hospitalist was called for admission for Chest pain   Review of Systems:    Pertinent positives include: nausea, vomiting, double slurred speech left arm pain and  weakness shortness of breath at rest dyspnea on exertion,  localizing neurological complaints,  Constitutional:  No weight loss, night sweats, Fevers, chills, fatigue, weight loss  HEENT:  No headaches, Difficulty swallowing,Tooth/dental problems,Sore throat,  No sneezing, itching, ear ache, nasal congestion, post nasal drip,  Cardio-vascular:  No chest pain, Orthopnea, PND, anasarca, dizziness, palpitations.no Bilateral lower extremity swelling  GI:  No heartburn, indigestion, abdominal pain, diarrhea, change in bowel habits, loss of appetite, melena, blood in stool, hematemesis Resp:  no . NoNo excess mucus, no productive cough, No non-productive cough, No coughing up of blood.No change in color of mucus.No wheezing. Skin:  no rash or lesions. No jaundice GU:  no dysuria, change in color of urine, no urgency or frequency. No straining to urinate.  No flank pain.  Musculoskeletal:  No joint pain or no joint swelling. No decreased range of motion. No back pain.  Psych:  No change in mood or affect. No depression or anxiety. No memory loss.  Neuro:   no tingling, no weakness, no double vision, no gait abnormality,  no confusion  All systems reviewed and apart from [place] all are negative  Past Medical History:   Past Medical History:  Diagnosis Date  . Arthritis   . Breast cancer ([place]) 2000   Rt lumpectomy, Chemo/XRT/rt axillary node   . Depression   . Diverticulosis   . GERD (gastroesophageal reflux disease)   . Hx of adenomatous colonic polyps   . Low back pain   . Obesity   . Personal history of chemotherapy   . Personal history of radiation therapy       Past Surgical History:  Procedure Laterality Date  . BREAST BIOPSY    . BREAST LUMPECTOMY Right    for CA txed w/ chemo and radiation, right  . TUBAL LIGATION    . UMBILICAL HERNIA REPAIR  1983    Social History:  Ambulatory   Independently    reports that she has never smoked. She has never used smokeless  tobacco. She reports that she does not drink alcohol or use drugs.     Family History:  Family History  Problem Relation Age of Onset  . Asthma Mother   . COPD Mother   . Heart attack Mother   . Diabetes Mother   . Lung cancer Father   . Asthma Daughter   . Kidney failure Brother   . Diabetes Brother        x 2  . Heart attack Brother   . Breast cancer Maternal Aunt   . Colon cancer Paternal Aunt   . Diabetes Daughter        x 2  . Hypertension Brother     Allergies: Allergies  Allergen Reactions  . Erythromycin Other (See Comments)    REACTION: stomach cramps  . Morphine Other (See Comments)    REACTION: Hallucinations  . Penicillins Other (See Comments)    REACTION: unkkown, was a child  . Relafen [Nabumetone] Nausea Only     Prior to Admission medications   Medication Sig Start Date End Date Taking? Authorizing Provider  calcium carbonate (OS-CAL - DOSED IN MG OF ELEMENTAL CALCIUM) 1250 (500 Ca) MG tablet Take 600 mg by mouth.    [provider]  Cholecalciferol (VITAMIN D) 2000 UNITS CAPS Take 4,000 Units by mouth daily.     [provider]  clotrimazole (LOTRIMIN) 1 % cream APPLY THIN COAT EXTERNALLY TO SKIN TWICE A DAY X 2 WKS, MAY REPEAT X 2 WKS 12/14/17   [provider]  dexlansoprazole (DEXILANT) 60 MG capsule Take 60 mg by mouth daily.    [provider]  escitalopram (LEXAPRO) 10 MG tablet Take 10 mg by mouth.    [provider]  Fluticasone-Umeclidin-Vilant (TRELEGY ELLIPTA) 100-62.5-25 MCG/INH AEPB Inhale 1 puff into the lungs daily. 01/08/18   Deneise Lever, MD  PROAIR HFA 108 [phone] Base) MCG/ACT inhaler TAKE 2 PUFFS BY MOUTH EVERY 4 HOURS AS NEEDED 09/06/17   [provider]  vitamin B-12 (CYANOCOBALAMIN) 500 MCG tablet Take 500 mcg by mouth daily.    [provider]  XARELTO 20 MG TABS tablet Take 1 tablet by mouth daily. 04/10/14   [provider]   Physical Exam: Blood pressure  137/73, pulse 62, temperature 98 F (36.7 C), temperature source Oral, resp. rate 16, height 5\' 5"  (1.651 m), weight 118.8 kg, SpO2 98 %. 1. General:  in No Acute distress  well  -appearing 2. Psychological: Alert and   Oriented 3. Head/ENT:     Dry Mucous Membranes                          Head Non traumatic, neck supple                          Normal   Dentition 4. SKIN:  decreased Skin turgor,  Skin clean Dry and intact no rash 5. Heart: Regular rate and rhythm no  Murmur, no Rub or gallop 6. Lungs: Clear to auscultation bilaterally, no wheezes or crackles   7. Abdomen: Soft,  non-tender, Non distended  obese  bowel sounds present 8. Lower extremities: no clubbing, cyanosis, or  edema 9. Neurologically  reports diminished strength on left leg CN 2-12 intact 10. MSK: Normal range of motion   LABS:     Recent Labs  Lab 01/15/18 1211  WBC 4.6  HGB 12.9  HCT 39.8  MCV 100.5*  PLT 378   Basic Metabolic Panel: Recent Labs  Lab 01/15/18 1211  NA 139  K 3.6  CL 113*  CO2 24  GLUCOSE 98  BUN 14  CREATININE 0.95  CALCIUM 8.3*      No results for input(s): AST, ALT, ALKPHOS, BILITOT, PROT, ALBUMIN in the last 168 hours. No results for input(s): LIPASE, AMYLASE in the last 168 hours. No results for input(s): AMMONIA in the last 168 hours.    HbA1C: No results for input(s): HGBA1C in the last 72 hours. CBG: No results for input(s): GLUCAP in the last 168 hours.    Urine analysis:    Component Value Date/Time   COLORURINE YELLOW 10/07/2016 [place] 10/07/2016 1437   LABSPEC 1.025 10/07/2016 1437   PHURINE 6.0 10/07/2016 1437   GLUCOSEU NEGATIVE 10/07/2016 1437   HGBUR NEGATIVE 10/07/2016 1437   BILIRUBINUR NEGATIVE 10/07/2016 1437   KETONESUR NEGATIVE 10/07/2016 1437   PROTEINUR NEGATIVE 10/07/2016 1437   UROBILINOGEN 1.0 11/13/2013 0304   NITRITE NEGATIVE 10/07/2016 1437   LEUKOCYTESUR NEGATIVE 10/07/2016 1437       Cultures:      Component Value Date/Time   SDES BLOOD LEFT HAND 10/07/2016 1653   SPECREQUEST  10/07/2016 1653    BOTTLES DRAWN AEROBIC AND ANAEROBIC Blood Culture adequate volume   CULT NO GROWTH 5 DAYS 10/07/2016 1653   REPTSTATUS 10/12/2016 FINAL 10/07/2016 1653     Radiological Exams on Admission: Dg Chest Port 1 View  Result Date: 01/15/2018 CLINICAL DATA:  Chest pain.  Cough. EXAM: PORTABLE CHEST 1 VIEW COMPARISON:  Radiographs of October 02, 2017. FINDINGS: Stable cardiomegaly. No pneumothorax or pleural effusion is noted. Both lungs are clear. The visualized skeletal structures are unremarkable. IMPRESSION: No acute cardiopulmonary abnormality seen. Electronically Signed   By: Marijo Conception, M.D.   On: 01/15/2018 13:39    Chart has been reviewed    Assessment/Plan  67 y.o. female with medical history significant of CAD,   chronic bronchitis/COPD, GERD, hx breast cancer, Hx DVT/ PE on Xarelto. CHF with grade 1 diastolic CHF     Admitted for Chest pain / left side possible weakness  Present on Admission: . Chest pain more atypical but worrisome history with history of dyspnea as well cardiology aware so far cardiac enzymes unremarkable obtain echogram defer to cardiology need further work-up . Obstructive chronic bronchitis with acute bronchitis (HCC) currently stable continue home medications he never smoked Questionable left-sided weakness discussed briefly with neurology will order MRI of neck and brain please consult neurology if abnormal imaging suggestive of neurological disorder . Pulmonary embolism ([place]) -on Xarelto continue . Dyspnea on exertion -order echogram cycle cardiac enzymes cardiology aware   Other plan as per orders.  DVT prophylaxis:  SCD     Code Status:   DNR/DNI as per patient   I had personally discussed CODE STATUS with patient    Family Communication:   Family at  Bedside  plan of care was discussed with   Daughter,   Disposition Plan:         To home once  workup is complete and patient is stable                      Consults called: Cardiology consulted  Admission status:   Obs    Level of care     tele  For   24H         Tytus Strahle 01/15/2018, 5:34 PM    [facility]  Pager [phone]   after 2 AM please page  floor coverage PA If 7AM-7PM, please contact the day team taking care of the patient  [email or web address]  Password [email or web address]

## 2018-01-15 NOTE — Consult Note (Addendum)
Cardiology Consultation:   Patient ID: Diane Barker; [id number]; 1950/11/21   Admit date: 01/15/2018 Date of Consult: 01/15/2018  Primary Care Provider: Shon Baton, MD Primary Cardiologist: Remotely Dr. Johnsie Cancel Primary Electrophysiologist:  None   Patient Profile:   Diane Barker is a 67 y.o. female with a PMH of HTN, GERD, PE on xarelto, depression, and breast CA s/p lumpectomy, who is being seen today for the evaluation of chest pain at the request of Dr. Roel Cluck.  History of Present Illness:   Diane Barker was in her usual state of health until yesterday afternoon when Diane Barker developed sudden onset left-sided chest pressure radiating to her left arm while walking between rooms in her house. Diane Barker reported associate nausea with an episode of vomiting, SOB, and weakness. Diane Barker sat down for 5 minutes and symptoms resolved. Diane Barker was able to participate in some halloween festivities that evening without recurrent symptoms. Diane Barker woke up on the morning of 01/15/18 and had some mild left sided chest pressure. Her PCP recommended that Diane Barker present to the ED for further evaluation.   Diane Barker denies prior cardiac history. Her last ischemic evaluation was a NST in 2009 which was negative for ischemia. Echo in 2015 with EF 55-60%, G1DD, no wall motion abnormalities, and no significant valvular abnormalities. Diane Barker has a family history of heart disease in her mother and brother. Diane Barker denies tobacco abuse.   At the time of this evaluation Diane Barker is chest pain free. Diane Barker reports DOE when walking to [employer] or taking out the trash. Diane Barker has chronic LE pain for which Diane Barker has seen a vascular doctor in the past but did not undergo any PVD work-up. Diane Barker reports occasional LE edema but states that it is non-pitting. Diane Barker has had a 22lb weight gain in the past 3 months which Diane Barker attributes to her sedentary lifestyle. Diane Barker denies orthopnea or PND and reports undergoing a sleep study in the past year which was negative for OSA. Diane Barker denies  palpitations, dizziness, syncope, problems with bleeding, or medication non-compliance.   Hospital course: hypertensive to 149/82, otherwise VSS. K 3.6, Cr 0.95, Hgb 12.9, PLT 206, Trop negative, Ddimer negative. CXR without acute findings. EKG with sinus rhythm, rate 60, isolated TWI in lead III, otherwise no STE/D. Patient was given ASA 324 mg in the ED and admitted to medicine. Cardiology asked to evaluate for chest pain.   Past Medical History:  Diagnosis Date  . Arthritis   . Breast cancer ([place]) 2000   Rt lumpectomy, Chemo/XRT/rt axillary node   . Depression   . Diverticulosis   . GERD (gastroesophageal reflux disease)   . Hx of adenomatous colonic polyps   . Low back pain   . Obesity   . Personal history of chemotherapy   . Personal history of radiation therapy     Past Surgical History:  Procedure Laterality Date  . BREAST BIOPSY    . BREAST LUMPECTOMY Right    for CA txed w/ chemo and radiation, right  . TUBAL LIGATION    . UMBILICAL HERNIA REPAIR  1983     Home Medications:  Prior to Admission medications   Medication Sig Start Date End Date Taking? Authorizing Provider  Cholecalciferol (VITAMIN D) 2000 UNITS CAPS Take 4,000 Units by mouth daily.    Yes [provider]  ciclopirox (LOPROX) 0.77 % cream Apply 1 application topically 2 (two) times daily.   Yes [provider]  dexlansoprazole (DEXILANT) 60 MG capsule Take 60 mg by  mouth daily.   Yes [provider]  docusate sodium (COLACE) 100 MG capsule Take 100 mg by mouth 2 (two) times daily.   Yes [provider]  escitalopram (LEXAPRO) 10 MG tablet Take 10 mg by mouth.   Yes [provider]  PROAIR HFA 108 [phone] Base) MCG/ACT inhaler Inhale 2 puffs into the lungs every 4 (four) hours as needed for wheezing or shortness of breath.  09/06/17  Yes [provider]  vitamin B-12 (CYANOCOBALAMIN) 500 MCG tablet Take 500 mcg by mouth daily.   Yes [provider]    XARELTO 20 MG TABS tablet Take 1 tablet by mouth daily. 04/10/14  Yes [provider]  Fluticasone-Umeclidin-Vilant (TRELEGY ELLIPTA) 100-62.5-25 MCG/INH AEPB Inhale 1 puff into the lungs daily. 01/08/18   Deneise Lever, MD    Inpatient Medications: Scheduled Meds:  Continuous Infusions:  PRN Meds:   Allergies:    Allergies  Allergen Reactions  . Erythromycin Other (See Comments)    REACTION: stomach cramps  . Morphine Other (See Comments)    REACTION: Hallucinations  . Penicillins Other (See Comments)    REACTION: unkkown, was a child  . Relafen [Nabumetone] Nausea Only    Social History:   Social History   Socioeconomic History  . Marital status: Single    Spouse name: Not on file  . Number of children: 2  . Years of education: 10  . Highest education level: Not on file  Occupational History  . 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 Financial resource strain: Not on file  . Food insecurity:    Worry: Not on file    Inability: Not on file  . Transportation needs:    Medical: Not on file    Non-medical: Not on file  Tobacco Use  . Smoking status: Never Smoker  . Smokeless tobacco: Never Used  Substance and Sexual Activity  . Alcohol use: No    Alcohol/week: 0.0 standard drinks  . Drug use: No  . Sexual activity: Yes  Lifestyle  . Physical activity:    Days per week: Not on file    Minutes per session: Not on file  . Stress: Not on file  Relationships  . Social connections:    Talks on phone: Not on file    Gets together: Not on file    Attends religious service: Not on file    Active member of club or organization: Not on file    Attends meetings of clubs or organizations: Not on file    Relationship status: Not on file  . Intimate partner violence:    Fear of current or ex partner: Not on file    Emotionally abused: Not on file    Physically abused: Not on file    Forced  sexual activity: Not on file  Other Topics Concern  . Not on file  Social History Narrative   Lives at home alone   Caffeine use- soda 16-24 oz weekly    Family History:    Family History  Problem Relation Age of Onset  . Asthma Mother   . COPD Mother   . Heart attack Mother   . Diabetes Mother   . Lung cancer Father   . Asthma Daughter   . Kidney failure Brother   . Diabetes Brother        x 2  . Heart attack Brother   .  Breast cancer Maternal Aunt   . Colon cancer Paternal Aunt   . Diabetes Daughter        x 2  . Hypertension Brother      ROS:  Please see the history of present illness.   All other ROS reviewed and negative.     Physical Exam/Data:   Vitals:   01/15/18 1131 01/15/18 1135 01/15/18 1430  BP:  137/73 (!) 149/82  Pulse:  62 (!) 48  Resp:  16 (!) 26  Temp:  98 F (36.7 C)   TempSrc:  Oral   SpO2:  98% 96%  Weight: 118.8 kg    Height: 5\' 5"  (1.651 m)     No intake or output data in the 24 hours ending 01/15/18 1616 Filed Weights   01/15/18 1131  Weight: 118.8 kg   Body mass index is 43.6 kg/m.  General:  Well nourished, well developed, obese female laying in bed in no acute distress HEENT: sclera anicteric  Neck: difficult to assess JVD given body habitus Vascular: No carotid bruits; multiple shallow ulcers on bilateral LE Cardiac:  normal S1, S2; RRR; no murmurs, rubs, or gallops Lungs:  clear to auscultation bilaterally, no wheezing, rhonchi or rales  Abd: NABS, soft, nontender, no hepatomegaly Ext: no edema Musculoskeletal:  No deformities, BUE and BLE strength normal and equal Skin: warm and dry  Neuro:  CNs 2-12 intact, no focal abnormalities noted Psych:  Normal affect   EKG:  The EKG was personally reviewed and demonstrates:  sinus rhythm, rate 60, isolated TWI in lead III, otherwise no STE/D.  Telemetry:  Telemetry was personally reviewed and demonstrates:  Sinus rhythm/sinus bradycardia  Relevant CV Studies: Echocardiogram  2015: Study Conclusions  - Left ventricle: The cavity size was normal. Systolic function was normal. The estimated ejection fraction was in the range of 55% to 60%. Wall motion was normal; there were no regional wall motion abnormalities. There was an increased relative contribution of atrial contraction to ventricular filling. Doppler parameters are consistent with abnormal left ventricular relaxation (grade 1 diastolic dysfunction). - Mitral valve: Calcified annulus. - Left atrium: The atrium was mildly dilated.  Laboratory Data:  Chemistry Recent Labs  Lab 01/15/18 1211  NA 139  K 3.6  CL 113*  CO2 24  GLUCOSE 98  BUN 14  CREATININE 0.95  CALCIUM 8.3*  GFRNONAA >60  GFRAA >60  ANIONGAP 2*    No results for input(s): PROT, ALBUMIN, AST, ALT, ALKPHOS, BILITOT in the last 168 hours. Hematology Recent Labs  Lab 01/15/18 1211  WBC 4.6  RBC 3.96  HGB 12.9  HCT 39.8  MCV 100.5*  MCH 32.6  MCHC 32.4  RDW 12.4  PLT 206   Cardiac EnzymesNo results for input(s): TROPONINI in the last 168 hours.  Recent Labs  Lab 01/15/18 1201  TROPIPOC 0.01    BNPNo results for input(s): BNP, PROBNP in the last 168 hours.  DDimer  Recent Labs  Lab 01/15/18 1237  DDIMER <0.27    Radiology/Studies:  Dg Chest Port 1 View  Result Date: 01/15/2018 CLINICAL DATA:  Chest pain.  Cough. EXAM: PORTABLE CHEST 1 VIEW COMPARISON:  Radiographs of October 02, 2017. FINDINGS: Stable cardiomegaly. No pneumothorax or pleural effusion is noted. Both lungs are clear. The visualized skeletal structures are unremarkable. IMPRESSION: No acute cardiopulmonary abnormality seen. Electronically Signed   By: Marijo Conception, M.D.   On: 01/15/2018 13:39    Assessment and Plan:   1. Chest pain: patient  presented with sudden onset chest pressure radiating to her left arm with associated SOB, N/V, and weakness which lasted ~5 minutes on 10/31. Diane Barker had mild chest pressure on 11/1 prompting  presentation to the ED. Trop negative x1. EKG non-ischemic. Last ischemic evaluation was a NST in 2009 which was negative for ischemia. Lipids have been elevated in the past but Diane Barker is not on a statin. Risk factors for ACS include HTN and family history of CAD. Prior CT chest reviewed with Dr. Margaretann Loveless and some evidence of CAD observed.   - Check lipid panel and HgbA1C for risk stratification - Continue to trend trop x3 or to peak - Will plan for a coronary CTA in the morning to further evaluate coronary anatomy. Diane Barker is bradycardic at baseline.  - NPO after MN   2. Elevated BP: Chart reviewed and BP consistently elevated above 130s/80s. Not on medications - Will start amlodipine 5mg  daily  3. HLD: cholesterol has been elevated in the past. Not on statin - F/u lipid panel in AM - Will start atorvastatin 40mg  daily  3. Left sided weakness: reported by patient. MRI head pending.  - Continue management per primary team  4. History of PE: on xarelto - Continue management per primary team.    For questions or updates, please contact [place] Please consult [email or web address] for contact info under Cardiology/STEMI.   Signed, Abigail Butts, PA-C  01/15/2018 4:16 PM [phone] ---------------------------------------------------------------------------------------------   History and all data above reviewed.  Patient examined.  I agree with the findings as above.  ALISEN MARSIGLIA feels well and is chest pain free. Diane Barker had left sided chest discomfort while walking a short distance. Diane Barker has coronary calcium on a previous CT angio for PE.   Constitutional: No acute distress Eyes: pupils equally round and reactive to light, sclera non-icteric, normal conjunctiva and lids ENMT:  moist mucous membranes Cardiovascular: regular rhythm, normal rate, no murmurs. S1 and S2 normal. Radial pulses normal bilaterally. No jugular venous distention.  Respiratory: clear to auscultation bilaterally GI :  normal bowel sounds, soft and nontender. No distention.   MSK: extremities warm, well perfused. No edema.  NEURO: grossly nonfocal exam, moves all extremities. PSYCH: alert and oriented x 3, normal mood and affect.   All available labs, radiology testing, previous records reviewed. Agree with documented assessment and plan of my colleague as stated above with the following additions or changes:  Active Problems:   Obstructive chronic bronchitis with acute bronchitis (HCC)   CHEST PAIN   BREAST CANCER, HX OF   Pulmonary embolism (HCC)   Dyspnea on exertion   Chest pain    Plan: Chest pain - Diane Barker has coronary calcium on a previous CT scan, and likely has CAD. A CTA coronary study will be a good way to assess for obstructive lesions that may be causing her chest pain. We will obtain this tomorrow.   Elevated BP- will start amlodipine as Diane Barker is not on therapy.   HLD and CAD - will start atorvastatin.  L sided weakness - cross sectional imaging of the brain pending.  Hx of PE - continue rivaroxaban.   Elouise Munroe, MD [facility] 7:12 PM  01/15/2018

## 2018-01-15 NOTE — Assessment & Plan Note (Signed)
She recognizes reflux as an active issue and says Dexilant is not controlling.  This will contribute to chest discomfort and cough.  I have asked her to see her PCP for possible referral back to GI.

## 2018-01-16 ENCOUNTER — Observation Stay (HOSPITAL_BASED_OUTPATIENT_CLINIC_OR_DEPARTMENT_OTHER): Payer: 59

## 2018-01-16 ENCOUNTER — Observation Stay (HOSPITAL_COMMUNITY): Payer: 59

## 2018-01-16 DIAGNOSIS — J440 Chronic obstructive pulmonary disease with acute lower respiratory infection: Secondary | ICD-10-CM | POA: Diagnosis present

## 2018-01-16 DIAGNOSIS — Z79899 Other long term (current) drug therapy: Secondary | ICD-10-CM | POA: Diagnosis not present

## 2018-01-16 DIAGNOSIS — Z923 Personal history of irradiation: Secondary | ICD-10-CM | POA: Diagnosis not present

## 2018-01-16 DIAGNOSIS — R079 Chest pain, unspecified: Secondary | ICD-10-CM | POA: Diagnosis not present

## 2018-01-16 DIAGNOSIS — Z888 Allergy status to other drugs, medicaments and biological substances status: Secondary | ICD-10-CM | POA: Diagnosis not present

## 2018-01-16 DIAGNOSIS — Z7951 Long term (current) use of inhaled steroids: Secondary | ICD-10-CM | POA: Diagnosis not present

## 2018-01-16 DIAGNOSIS — R0609 Other forms of dyspnea: Secondary | ICD-10-CM | POA: Diagnosis not present

## 2018-01-16 DIAGNOSIS — I200 Unstable angina: Secondary | ICD-10-CM | POA: Diagnosis not present

## 2018-01-16 DIAGNOSIS — Z7901 Long term (current) use of anticoagulants: Secondary | ICD-10-CM | POA: Diagnosis not present

## 2018-01-16 DIAGNOSIS — E785 Hyperlipidemia, unspecified: Secondary | ICD-10-CM | POA: Diagnosis present

## 2018-01-16 DIAGNOSIS — R531 Weakness: Secondary | ICD-10-CM | POA: Diagnosis present

## 2018-01-16 DIAGNOSIS — Z86718 Personal history of other venous thrombosis and embolism: Secondary | ICD-10-CM | POA: Diagnosis not present

## 2018-01-16 DIAGNOSIS — Z66 Do not resuscitate: Secondary | ICD-10-CM | POA: Diagnosis present

## 2018-01-16 DIAGNOSIS — Z885 Allergy status to narcotic agent status: Secondary | ICD-10-CM | POA: Diagnosis not present

## 2018-01-16 DIAGNOSIS — K219 Gastro-esophageal reflux disease without esophagitis: Secondary | ICD-10-CM | POA: Diagnosis present

## 2018-01-16 DIAGNOSIS — I5030 Unspecified diastolic (congestive) heart failure: Secondary | ICD-10-CM

## 2018-01-16 DIAGNOSIS — I25110 Atherosclerotic heart disease of native coronary artery with unstable angina pectoris: Secondary | ICD-10-CM | POA: Diagnosis present

## 2018-01-16 DIAGNOSIS — I2782 Chronic pulmonary embolism: Secondary | ICD-10-CM | POA: Diagnosis not present

## 2018-01-16 DIAGNOSIS — Z853 Personal history of malignant neoplasm of breast: Secondary | ICD-10-CM | POA: Diagnosis not present

## 2018-01-16 DIAGNOSIS — F329 Major depressive disorder, single episode, unspecified: Secondary | ICD-10-CM | POA: Diagnosis present

## 2018-01-16 DIAGNOSIS — Z6841 Body Mass Index (BMI) 40.0 and over, adult: Secondary | ICD-10-CM | POA: Diagnosis not present

## 2018-01-16 DIAGNOSIS — J209 Acute bronchitis, unspecified: Secondary | ICD-10-CM | POA: Diagnosis present

## 2018-01-16 DIAGNOSIS — I5032 Chronic diastolic (congestive) heart failure: Secondary | ICD-10-CM | POA: Diagnosis present

## 2018-01-16 DIAGNOSIS — Z880 Allergy status to penicillin: Secondary | ICD-10-CM | POA: Diagnosis not present

## 2018-01-16 DIAGNOSIS — I110 Hypertensive heart disease with heart failure: Secondary | ICD-10-CM | POA: Diagnosis present

## 2018-01-16 DIAGNOSIS — Z9221 Personal history of antineoplastic chemotherapy: Secondary | ICD-10-CM | POA: Diagnosis not present

## 2018-01-16 DIAGNOSIS — Z86711 Personal history of pulmonary embolism: Secondary | ICD-10-CM | POA: Diagnosis not present

## 2018-01-16 LAB — CBC
HCT: 36.5 % (ref 36.0–46.0)
Hemoglobin: 11.9 g/dL — ABNORMAL LOW (ref 12.0–15.0)
MCH: 32.2 pg (ref 26.0–34.0)
MCHC: 32.6 g/dL (ref 30.0–36.0)
MCV: 98.9 fL (ref 80.0–100.0)
Platelets: 196 10*3/uL (ref 150–400)
RBC: 3.69 MIL/uL — ABNORMAL LOW (ref 3.87–5.11)
RDW: 12.4 % (ref 11.5–15.5)
WBC: 5 10*3/uL (ref 4.0–10.5)
nRBC: 0 % (ref 0.0–0.2)

## 2018-01-16 LAB — COMPREHENSIVE METABOLIC PANEL
ALBUMIN: 3 g/dL — AB (ref 3.5–5.0)
ALT: 10 U/L (ref 0–44)
AST: 13 U/L — AB (ref 15–41)
Alkaline Phosphatase: 49 U/L (ref 38–126)
Anion gap: 3 — ABNORMAL LOW (ref 5–15)
BILIRUBIN TOTAL: 0.6 mg/dL (ref 0.3–1.2)
BUN: 11 mg/dL (ref 8–23)
CHLORIDE: 115 mmol/L — AB (ref 98–111)
CO2: 22 mmol/L (ref 22–32)
CREATININE: 0.94 mg/dL (ref 0.44–1.00)
Calcium: 8 mg/dL — ABNORMAL LOW (ref 8.9–10.3)
GFR calc Af Amer: >60 mL/min (ref >60)
GFR calc non Af Amer: >60 mL/min (ref >60)
GLUCOSE: 95 mg/dL (ref 70–99)
POTASSIUM: 3.9 mmol/L (ref 3.5–5.1)
Sodium: 140 mmol/L (ref 135–145)
Total Protein: 5.9 g/dL — ABNORMAL LOW (ref 6.5–8.1)

## 2018-01-16 LAB — ECHOCARDIOGRAM COMPLETE
HEIGHTINCHES: 65 in
Weight: 4156.8 oz

## 2018-01-16 LAB — LIPID PANEL
CHOL/HDL RATIO: 4 ratio
Cholesterol: 182 mg/dL (ref 0–200)
HDL: 46 mg/dL (ref >40)
LDL Cholesterol: 124 mg/dL — ABNORMAL HIGH (ref 0–99)
TRIGLYCERIDES: 60 mg/dL (ref <150)
VLDL: 12 mg/dL (ref 0–40)

## 2018-01-16 LAB — HIV ANTIBODY (ROUTINE TESTING W REFLEX): HIV Screen 4th Generation wRfx: NONREACTIVE

## 2018-01-16 LAB — HEMOGLOBIN A1C
Hgb A1c MFr Bld: 5.3 % (ref 4.8–5.6)
Mean Plasma Glucose: 105.41 mg/dL

## 2018-01-16 LAB — TROPONIN I: Troponin I: <0.03 ng/mL (ref <0.03)

## 2018-01-16 LAB — MAGNESIUM: Magnesium: 2 mg/dL (ref 1.7–2.4)

## 2018-01-16 LAB — PHOSPHORUS: Phosphorus: 2.9 mg/dL (ref 2.5–4.6)

## 2018-01-16 MED ORDER — NITROGLYCERIN 0.4 MG SL SUBL
0.4000 mg | SUBLINGUAL_TABLET | Freq: Once | SUBLINGUAL | Status: AC
  Administered 2018-01-16: 0.8 mg via SUBLINGUAL

## 2018-01-16 MED ORDER — PERFLUTREN LIPID MICROSPHERE
1.0000 mL | INTRAVENOUS | Status: AC | PRN
  Administered 2018-01-16: 3 mL via INTRAVENOUS
  Filled 2018-01-16: qty 10

## 2018-01-16 MED ORDER — NITROGLYCERIN 0.4 MG SL SUBL
SUBLINGUAL_TABLET | SUBLINGUAL | Status: AC
  Administered 2018-01-16: 0.4 mg
  Filled 2018-01-16: qty 1

## 2018-01-16 MED ORDER — NITROGLYCERIN 0.4 MG SL SUBL
SUBLINGUAL_TABLET | SUBLINGUAL | Status: AC
  Administered 2018-01-16: 0.8 mg via SUBLINGUAL
  Filled 2018-01-16: qty 2

## 2018-01-16 MED ORDER — IOPAMIDOL (ISOVUE-370) INJECTION 76%
100.0000 mL | Freq: Once | INTRAVENOUS | Status: AC | PRN
  Administered 2018-01-16: 100 mL via INTRAVENOUS

## 2018-01-16 NOTE — Progress Notes (Signed)
Progress Note  Patient Name: Diane Barker Date of Encounter: 01/16/2018  Primary Cardiologist: No primary care provider on file.  Subjective   Having chest pressure today.  Inpatient Medications    Scheduled Meds: . amLODipine  5 mg Oral Daily  . aspirin EC  81 mg Oral Daily  . atorvastatin  40 mg Oral q1800  . escitalopram  10 mg Oral Daily  . guaiFENesin  600 mg Oral BID  . pantoprazole  40 mg Oral Daily  . rivaroxaban  20 mg Oral q1800   Continuous Infusions:  PRN Meds: acetaminophen **OR** acetaminophen, bisacodyl, HYDROcodone-acetaminophen, levalbuterol, ondansetron **OR** ondansetron (ZOFRAN) IV, polyethylene glycol   Vital Signs    Vitals:   01/15/18 1648 01/16/18 0356 01/16/18 0911 01/16/18 1326  BP:  132/82 130/81 108/85  Pulse: 62 64  64  Resp:  20  19  Temp: 97.8 F (36.6 C) (!) 97.4 F (36.3 C)  98.1 F (36.7 C)  TempSrc: Oral Oral  Oral  SpO2: 100% 98%  98%  Weight:  117.8 kg    Height:        Intake/Output Summary (Last 24 hours) at 01/16/2018 1819 Last data filed at 01/16/2018 1809 Gross per 24 hour  Intake 480 ml  Output -  Net 480 ml   Filed Weights   01/15/18 1131 01/16/18 0356  Weight: 118.8 kg 117.8 kg    Telemetry    Sinus rhythm - Personally Reviewed  ECG    No new- Personally Reviewed  Physical Exam   GEN: No acute distress.   Neck: No JVD Cardiac: RRR, no murmurs, rubs, or gallops.  Respiratory: Clear to auscultation bilaterally. GI: Soft, nontender, non-distended  MS: No edema; No deformity. Neuro:  Nonfocal  Psych: Normal affect   Labs    Chemistry Recent Labs  Lab 01/15/18 1211 01/16/18 0238  NA 139 140  K 3.6 3.9  CL 113* 115*  CO2 24 22  GLUCOSE 98 95  BUN 14 11  CREATININE 0.95 0.94  CALCIUM 8.3* 8.0*  PROT  --  5.9*  ALBUMIN  --  3.0*  AST  --  13*  ALT  --  10  ALKPHOS  --  49  BILITOT  --  0.6  GFRNONAA >60 >60  GFRAA >60 >60  ANIONGAP 2* 3*     Hematology Recent Labs  Lab  01/15/18 1211 01/16/18 0238  WBC 4.6 5.0  RBC 3.96 3.69*  HGB 12.9 11.9*  HCT 39.8 36.5  MCV 100.5* 98.9  MCH 32.6 32.2  MCHC 32.4 32.6  RDW 12.4 12.4  PLT 206 196    Cardiac Enzymes Recent Labs  Lab 01/15/18 1521 01/15/18 2049 01/16/18 0238  TROPONINI <0.03 <0.03 <0.03    Recent Labs  Lab 01/15/18 1201  TROPIPOC 0.01     BNPNo results for input(s): BNP, PROBNP in the last 168 hours.   DDimer  Recent Labs  Lab 01/15/18 1237  DDIMER <0.27     Radiology    Mr Brain Wo Contrast  Result Date: 01/15/2018 CLINICAL DATA:  Left arm weakness EXAM: MRI HEAD WITHOUT CONTRAST MRI CERVICAL SPINE WITHOUT CONTRAST TECHNIQUE: Multiplanar, multiecho pulse sequences of the brain and surrounding structures, and cervical spine, to include the craniocervical junction and cervicothoracic junction, were obtained without intravenous contrast. COMPARISON:  Brain MRI 04/04/2015 FINDINGS: MRI HEAD FINDINGS BRAIN: There is no acute infarct, acute hemorrhage, hydrocephalus or extra-axial collection. The midline structures are normal. No midline shift or other mass  effect. There are no old infarcts. The white matter signal is normal for the patient's age. The cerebral and cerebellar volume are age-appropriate. Susceptibility-sensitive sequences show no chronic microhemorrhage or superficial siderosis. VASCULAR: Major intracranial arterial and venous sinus flow voids are normal. SKULL AND UPPER CERVICAL SPINE: Calvarial bone marrow signal is normal. There is no skull base mass. Visualized upper cervical spine and soft tissues are normal. SINUSES/ORBITS: No fluid levels or advanced mucosal thickening. No mastoid or middle ear effusion. The orbits are normal. MRI CERVICAL SPINE FINDINGS Alignment: Normal. Vertebrae: C7 hemangioma. No compression fracture or discitis-osteomyelitis. Cord: Normal caliber and signal. Posterior Fossa, vertebral arteries, paraspinal tissues: Visualized posterior fossa is normal.  Vertebral artery flow voids are preserved. No prevertebral effusion. Disc levels: Sagittal imaging includes the atlantoaxial joint to the level of the T1-2 disc space, with axial imaging of the disc spaces from C2-3 to T1-T2. C2-3: Mild left facet hypertrophy. No disc bulge. No spinal canal stenosis. Mildly narrowed left neural foramen. C3-4: No spinal canal or neural foraminal stenosis. C4-5: Normal disc.  No stenosis. C5-6: Small disc bulge. Normal facets. No spinal canal or neural foraminal stenosis. C6-7: No disc herniation or stenosis. C7-T1: Normal. T1-2: Normal. Ax GRE images of the cervical spine were not obtained, which mildly degrades assessment of the neural foramina. However, T2-weighted imaging typically overestimates foraminal stenosis. So, given the lack of stenosis on the provided images, I do not think returning for GRE images would be particularly helpful. IMPRESSION: 1. Normal MRI of the brain. 2. Mild cervical degenerative disc disease with mild narrowing of the left C2-3 foramen. Small disc bulge at C5-6 without stenosis. Electronically Signed   By: Ulyses Jarred M.D.   On: 01/15/2018 20:58   Mr Cervical Spine Wo Contrast  Result Date: 01/15/2018 CLINICAL DATA:  Left arm weakness EXAM: MRI HEAD WITHOUT CONTRAST MRI CERVICAL SPINE WITHOUT CONTRAST TECHNIQUE: Multiplanar, multiecho pulse sequences of the brain and surrounding structures, and cervical spine, to include the craniocervical junction and cervicothoracic junction, were obtained without intravenous contrast. COMPARISON:  Brain MRI 04/04/2015 FINDINGS: MRI HEAD FINDINGS BRAIN: There is no acute infarct, acute hemorrhage, hydrocephalus or extra-axial collection. The midline structures are normal. No midline shift or other mass effect. There are no old infarcts. The white matter signal is normal for the patient's age. The cerebral and cerebellar volume are age-appropriate. Susceptibility-sensitive sequences show no chronic  microhemorrhage or superficial siderosis. VASCULAR: Major intracranial arterial and venous sinus flow voids are normal. SKULL AND UPPER CERVICAL SPINE: Calvarial bone marrow signal is normal. There is no skull base mass. Visualized upper cervical spine and soft tissues are normal. SINUSES/ORBITS: No fluid levels or advanced mucosal thickening. No mastoid or middle ear effusion. The orbits are normal. MRI CERVICAL SPINE FINDINGS Alignment: Normal. Vertebrae: C7 hemangioma. No compression fracture or discitis-osteomyelitis. Cord: Normal caliber and signal. Posterior Fossa, vertebral arteries, paraspinal tissues: Visualized posterior fossa is normal. Vertebral artery flow voids are preserved. No prevertebral effusion. Disc levels: Sagittal imaging includes the atlantoaxial joint to the level of the T1-2 disc space, with axial imaging of the disc spaces from C2-3 to T1-T2. C2-3: Mild left facet hypertrophy. No disc bulge. No spinal canal stenosis. Mildly narrowed left neural foramen. C3-4: No spinal canal or neural foraminal stenosis. C4-5: Normal disc.  No stenosis. C5-6: Small disc bulge. Normal facets. No spinal canal or neural foraminal stenosis. C6-7: No disc herniation or stenosis. C7-T1: Normal. T1-2: Normal. Ax GRE images of the cervical spine were not  obtained, which mildly degrades assessment of the neural foramina. However, T2-weighted imaging typically overestimates foraminal stenosis. So, given the lack of stenosis on the provided images, I do not think returning for GRE images would be particularly helpful. IMPRESSION: 1. Normal MRI of the brain. 2. Mild cervical degenerative disc disease with mild narrowing of the left C2-3 foramen. Small disc bulge at C5-6 without stenosis. Electronically Signed   By: Ulyses Jarred M.D.   On: 01/15/2018 20:58   Ct Coronary Morph W/cta Cor W/score W/ca W/cm &/or Wo/cm  Addendum Date: 01/16/2018   ADDENDUM REPORT: 01/16/2018 13:44 HISTORY: Chest pain, coronary calcium  EXAM: Cardiac/Coronary  CT TECHNIQUE: The patient was scanned on a [employer]. PROTOCOL: A 120 kV prospective scan was triggered in the descending thoracic aorta at 111 HU's. Axial non-contrast 3 mm slices were carried out through the heart. The data set was analyzed on a dedicated work station and scored using the [place]. Gantry rotation speed was 250 msecs and collimation was .6 mm. No beta blockade and 0.8 mg of sl NTG was given. The 3D data set was reconstructed in 5% intervals of the 67-82 % of the R-R cycle. Diastolic phases were analyzed on a dedicated work station using MPR, MIP and VRT modes. The patient received 80 cc of contrast. FINDINGS: Coronary calcium score: The patient's coronary artery calcium score is 455, which places the patient in the 93 percentile. This is higher than expected for age and gender matched peers. Coronary arteries: Normal coronary origins.  Right dominance. Right Coronary Artery: Minimal mixed atherosclerotic plaque in the proximal, mid and distal RCA (<25% stenosis). Patent medium caliber PDA and patent small caliber PL branch. Left Main Coronary Artery: Minimal irregular plaque (<25% stenosis) Left Anterior Descending Coronary Artery: There is a moderate heavily calcified plaque from proximal to mid LAD with 50-69% stenosis. There is a severe mixed atherosclerotic plaque in the mid LAD, 70-99% stenosis. There is a moderate calcified lesion immediately distal, and the distal LAD diffusely disease with mild atherosclerotic plaque (25-49% stenosis). Patent small caliber diagonal branches. Left Circumflex Artery: Minimal scattered plaque (<25% stenosis) in the circumflex artery. There is a mild mixed atherosclerotic plaque at the ostium of OM1 (25-49% stenosis). Patent distal vessel. Aorta: Normal size. No calcifications. No dissection. Likely mildly tortuous aorta, arch not imaged for sidedness. Aortic Valve: No calcifications. Other findings: Normal pulmonary  vein drainage into the left atrium. Normal let atrial appendage without a thrombus. Normal size of the pulmonary artery. Mild mitral annular calcification. IMPRESSION: 1. The patient's coronary artery calcium score is 455, which places the patient in the 93 percentile. This is higher than expected for age and gender matched peers. 2. Normal coronary origin with right dominance. 3.  Severe CAD of the mid LAD, CADRADS = 4. Electronically Signed   By: Cherlynn Kaiser   On: 01/16/2018 13:44   Result Date: 01/16/2018 EXAM: OVER-READ INTERPRETATION  CT CHEST The following report is an over-read performed by radiologist Dr. Suzy Bouchard of [facility], [place] on 01/16/2018. This over-read does not include interpretation of cardiac or coronary anatomy or pathology. The coronary CTA interpretation by the cardiologist is attached. COMPARISON:  None. FINDINGS: Limited view of the lung parenchyma demonstrates no suspicious nodularity. Airways are normal. Limited view of the mediastinum demonstrates no adenopathy. Esophagus normal. Limited view of the upper abdomen unremarkable. Limited view of the skeleton and chest wall is unremarkable. Degenerative osteophytosis of the spine. IMPRESSION: No significant extracardiac findings.  Electronically Signed: By: Suzy Bouchard M.D. On: 01/16/2018 12:57   Dg Chest Port 1 View  Result Date: 01/15/2018 CLINICAL DATA:  Chest pain.  Cough. EXAM: PORTABLE CHEST 1 VIEW COMPARISON:  Radiographs of October 02, 2017. FINDINGS: Stable cardiomegaly. No pneumothorax or pleural effusion is noted. Both lungs are clear. The visualized skeletal structures are unremarkable. IMPRESSION: No acute cardiopulmonary abnormality seen. Electronically Signed   By: Marijo Conception, M.D.   On: 01/15/2018 13:39    Cardiac Studies  Coronary CTA 01/16/18  IMPRESSION: 1. The patient's coronary artery calcium score is 455, which places the patient in the 93 percentile. This is higher than expected  for age and gender matched peers.  2. Normal coronary origin with right dominance.  3.  Severe CAD of the mid LAD, CADRADS = 4.  Echo 01/16/18 Study Conclusions  - Procedure narrative: Transthoracic echocardiography. Image   quality was poor. The study was technically difficult, as a   result of body habitus. Intravenous contrast (Definity) was   administered. - Left ventricle: The cavity size was normal. Wall thickness was   increased in a pattern of moderate LVH. Systolic function was   normal. The estimated ejection fraction was in the range of 55%   to 60%. Wall motion was normal; there were no regional wall   motion abnormalities. Doppler parameters are consistent with   abnormal left ventricular relaxation (grade 1 diastolic   dysfunction). The E/e&' ratio is >15, suggesting elevated LV   filling pressure. - Mitral valve: Calcified annulus. Mildly thickened leaflets .   There was trivial regurgitation. - Left atrium: The atrium was normal in size. - Inferior vena cava: The vessel was normal in size. The   respirophasic diameter changes were in the normal range (>= 50%),   consistent with normal central venous pressure.  Impressions:  - Technically difficult study. Definity contrast given LVEF 55-60%,   moderate LVH, no definite regional wall motion abnormalities,   grade 1 DD, elevated LV filling pressure, trivial MR, normal LA   size, normal IVC.   Patient Profile  Diane Barker is a 67 y.o. female with a PMH of HTN, GERD, PE on xarelto, depression, and breast CA s/p lumpectomy, who is being seen today for the evaluation of chest pain at the request of Dr. Roel Cluck.  Assessment & Plan    Principal Problem:   Unstable angina (HCC) Active Problems:   Obstructive chronic bronchitis with acute bronchitis (HCC)   CHEST PAIN   BREAST CANCER, HX OF   Pulmonary embolism (HCC)   Dyspnea on exertion   Chest pain  UA/DOE - today she is having unstable angina  responsive to nitroglycerin. She has limiting shortness of breath at home over the last 3 months, progressive over 6 months. We will plan for coronary angiography Monday. -CT coronary angiogram shows a flow limiting lesions in the mid-LAD, confirmed by CT FFR (images reviewed, report pending). -Hold xarelto until cath, start again afterward. -start nitro gtt for continued CP.  INFORMED CONSENT: I have reviewed the risks, indications, and alternatives to cardiac catheterization, possible angioplasty, and stenting with the patient. Risks include but are not limited to bleeding, infection, vascular injury, stroke, myocardial infection, arrhythmia, kidney injury, radiation-related injury in the case of prolonged fluoroscopy use, emergency cardiac surgery, and death. The patient understands the risks of serious complication is 1-2 in 5093 with diagnostic cardiac cath and 1-2% or less with angioplasty/stenting.   Hx of PE -  Holding  xarelto until cath is complete, please restart afterward when safe after cath.     For questions or updates, please contact [place] Please consult [email or web address] for contact info under      Signed, Elouise Munroe, MD  01/16/2018, 6:19 PM

## 2018-01-16 NOTE — Progress Notes (Signed)
Pt c/o chest pain. Pt observe placing her hand mid chest area. Nitroglycerine SL given. Initial chest pain at 6/10. After 1 ntg tablet chest pain at 5/10. Now 1/10 chest pain post 2nd dose of ntg SL. EKG done. Placed pt on 2 L O2 via nasal cannula . Will update Dr. Margaretann Loveless. BP 108/66. HR = 63 .

## 2018-01-16 NOTE — Evaluation (Signed)
Physical Therapy Evaluation Patient Details Name: Diane Barker MRN: [id number] DOB: 06-02-1950 Today's Date: 01/16/2018   History of Present Illness  Diane Barker is a 67 y.o. female with medical history significant of CAD,   chronic bronchitis/COPD, GERD, hx breast cancer, Hx DVT/ PE on Xarelto. CHF with grade 1 diastolic CHF presenting with CP     Clinical Impression  Patient evaluated by Physical Therapy with no further acute PT needs identified. All education has been completed and the patient has no further questions. PTA,pt living alone independent and will have her daughter staying with her for next several weeks. Today patient ambulating hallway without assistance or UE support, no overt LOB however with limited activity tolerance. Becomes DOE 3/4 by 100' of walking. Discussed daily aerobic exercise program, pt states she wishes to become healthier and knows shes lived a sedentary lifestyle. Feel OP PT can assist, as well as potential referrals to nutritionist.  See below for any follow-up Physical Therapy or equipment needs. PT is signing off. Thank you for this referral.  Vitals: HR 75-85 this visit SpO2 WNL on RA.      Follow Up Recommendations Outpatient PT    Equipment Recommendations  None recommended by PT    Recommendations for Other Services       Precautions / Restrictions Precautions Precautions: None Restrictions Weight Bearing Restrictions: No      Mobility  Bed Mobility Overal bed mobility: Independent                Transfers Overall transfer level: Independent                  Ambulation/Gait Ambulation/Gait assistance: Min guard;Supervision Gait Distance (Feet): 100 Feet Assistive device: None Gait Pattern/deviations: WFL(Within Functional Limits) Gait velocity: decreased   General Gait Details: DOE 3/4 quickly, HR 80, SpO2 WNL on RA  checked after 100'   Stairs            Wheelchair Mobility    Modified Rankin (Stroke  Patients Only)       Balance Overall balance assessment: Mild deficits observed, not formally tested                                           Pertinent Vitals/Pain Pain Assessment: No/denies pain    Home Living Family/patient expects to be discharged to:: Private residence Living Arrangements: Alone Available Help at Discharge: Family Type of Home: House Home Access: Stairs to enter   Technical brewer of Steps: 1 Home Layout: One level Home Equipment: None      Prior Function Level of Independence: Independent               Hand Dominance        Extremity/Trunk Assessment   Upper Extremity Assessment Upper Extremity Assessment: Overall WFL for tasks assessed    Lower Extremity Assessment Lower Extremity Assessment: Overall WFL for tasks assessed    Cervical / Trunk Assessment Cervical / Trunk Assessment: Normal  Communication   Communication: No difficulties  Cognition Arousal/Alertness: Awake/alert                                            General Comments      Exercises     Assessment/Plan  PT Assessment Patent does not need any further PT services  PT Problem List Decreased strength;Decreased range of motion;Decreased activity tolerance;Decreased balance;Decreased mobility;Decreased coordination       PT Treatment Interventions DME instruction;Gait training;Stair training;Functional mobility training;Therapeutic activities;Therapeutic exercise    PT Goals (Current goals can be found in the Care Plan section)  Acute Rehab PT Goals Patient Stated Goal: get healthier  PT Goal Formulation: With patient Time For Goal Achievement: 01/30/18 Potential to Achieve Goals: Good    Frequency Min 3X/week   Barriers to discharge        Co-evaluation               AM-PAC PT "6 Clicks" Daily Activity  Outcome Measure Difficulty turning over in bed (including adjusting bedclothes, sheets and  blankets)?: None Difficulty moving from lying on back to sitting on the side of the bed? : None Difficulty sitting down on and standing up from a chair with arms (e.g., wheelchair, bedside commode, etc,.)?: None Help needed moving to and from a bed to chair (including a wheelchair)?: A Little Help needed walking in hospital room?: A Little Help needed climbing 3-5 steps with a railing? : A Little 6 Click Score: 21    End of Session Equipment Utilized During Treatment: Gait belt Activity Tolerance: Patient limited by fatigue Patient left: in bed;with call bell/phone within reach;with family/visitor present Nurse Communication: Mobility status PT Visit Diagnosis: Unsteadiness on feet (R26.81);Other abnormalities of gait and mobility (R26.89);Repeated falls (R29.6);Muscle weakness (generalized) (M62.81)    Time: 6203-5597 PT Time Calculation (min) (ACUTE ONLY): 15 min   Charges:   PT Evaluation $PT Eval Low Complexity: 1 Low          Reinaldo Berber, PT, DPT Acute Rehabilitation Services Pager: [phone] Office: [phone]    Reinaldo Berber 01/16/2018, 2:14 PM

## 2018-01-16 NOTE — Progress Notes (Signed)
  Echocardiogram 2D Echocardiogram with definity has been performed.  Diane Barker M 01/16/2018, 10:47 AM

## 2018-01-16 NOTE — Progress Notes (Signed)
PROGRESS NOTE    ZOEE HEENEY  CBU:[id number] DOB: 12-30-1950 DOA: 01/15/2018 PCP: Shon Baton, MD  Outpatient Specialists:    Brief Narrative:  TERRIONA HORLACHER a 67 y.o.femalewith a PMH of HTN, GERD, PE on xarelto, depression, and breast CA s/p lumpectomy,who is admitted for chest pain,  Associated with dyspnea on exertion and relieved by nitroglycerin   Assessment & Plan:   Active Problems:   Obstructive chronic bronchitis with acute bronchitis (HCC)   CHEST PAIN   BREAST CANCER, HX OF   Pulmonary embolism (HCC)   Dyspnea on exertion   Chest pain  Unstable angina  Presents with progressive dyspnea on exertion chest pain relieved by nitroglycerin  H/o Severe CAD of the mid LAD, CADRADS = 4 CT coronary angiogram shows a flow limiting lesions in the mid-LAD, confirmed by CT FFR  NTG gtt for Chest pain Hold xarelto until cath, start again afterward. Cardiology consulting   pulmonary embolism Hold xarelto until cath, start again afterward   DVT prophylaxis: SCD's Code Status: DNR/DNI Family Communication:  Disposition Plan:Home   Consultants:   cardiology   Procedures:   cardiac catheterization 01/17/2018 Antimicrobials:    Subjective:  episodes of chest pain relieved by nitroglycerin  Objective: Vitals:   01/15/18 1430 01/15/18 1648 01/16/18 0356 01/16/18 0911  BP: (!) 149/82 (!) 135/93 132/82 130/81  Pulse: (!) 48 62 64   Resp: (!) 26  20   Temp:  97.8 F (36.6 C) (!) 97.4 F (36.3 C)   TempSrc:  Oral Oral   SpO2: 96% 100% 98%   Weight:   117.8 kg   Height:       No intake or output data in the 24 hours ending 01/16/18 1051 Filed Weights   01/15/18 1131 01/16/18 0356  Weight: 118.8 kg 117.8 kg    Examination:  General exam: Appears calm and comfortable  Respiratory system: Clear to auscultation. Respiratory effort normal. Cardiovascular system: S1 & S2 heard, RRR. No JVD, murmurs, rubs, gallops or clicks. No pedal edema. Gastrointestinal  system: Abdomen is nondistended, soft and nontender. No organomegaly or masses felt. Normal bowel sounds heard. Central nervous system: Alert and oriented. No focal neurological deficits. Extremities: Symmetric 5 x 5 power. Skin: No rashes, lesions or ulcers Psychiatry: Judgement and insight appear normal. Mood & affect appropriate.     Data Reviewed: I have personally reviewed following labs and imaging studies  CBC: Recent Labs  Lab 01/15/18 1211 01/16/18 0238  WBC 4.6 5.0  HGB 12.9 11.9*  HCT 39.8 36.5  MCV 100.5* 98.9  PLT 206 032   Basic Metabolic Panel: Recent Labs  Lab 01/15/18 1211 01/16/18 0238  NA 139 140  K 3.6 3.9  CL 113* 115*  CO2 24 22  GLUCOSE 98 95  BUN 14 11  CREATININE 0.95 0.94  CALCIUM 8.3* 8.0*  MG  --  2.0  PHOS  --  2.9   GFR: Estimated Creatinine Clearance: 73.5 mL/min (by C-G formula based on SCr of 0.94 mg/dL). Liver Function Tests: Recent Labs  Lab 01/16/18 0238  AST 13*  ALT 10  ALKPHOS 49  BILITOT 0.6  PROT 5.9*  ALBUMIN 3.0*   No results for input(s): LIPASE, AMYLASE in the last 168 hours. No results for input(s): AMMONIA in the last 168 hours. Coagulation Profile: No results for input(s): INR, PROTIME in the last 168 hours. Cardiac Enzymes: Recent Labs  Lab 01/15/18 1521 01/15/18 2049 01/16/18 0238  TROPONINI <0.03 <0.03 <0.03  BNP (last 3 results) No results for input(s): PROBNP in the last 8760 hours. HbA1C: Recent Labs    01/16/18 0238  HGBA1C 5.3   CBG: No results for input(s): GLUCAP in the last 168 hours. Lipid Profile: Recent Labs    01/16/18 0238  CHOL 182  HDL 46  LDLCALC 124*  TRIG 60  CHOLHDL 4.0   Thyroid Function Tests: Recent Labs    01/15/18 1621  TSH 2.974   Anemia Panel: No results for input(s): VITAMINB12, FOLATE, FERRITIN, TIBC, IRON, RETICCTPCT in the last 72 hours. Urine analysis:    Component Value Date/Time   COLORURINE YELLOW 10/07/2016 [place]  10/07/2016 1437   LABSPEC 1.025 10/07/2016 1437   PHURINE 6.0 10/07/2016 1437   GLUCOSEU NEGATIVE 10/07/2016 1437   HGBUR NEGATIVE 10/07/2016 1437   BILIRUBINUR NEGATIVE 10/07/2016 1437   KETONESUR NEGATIVE 10/07/2016 1437   PROTEINUR NEGATIVE 10/07/2016 1437   UROBILINOGEN 1.0 11/13/2013 0304   NITRITE NEGATIVE 10/07/2016 1437   LEUKOCYTESUR NEGATIVE 10/07/2016 1437   Sepsis Labs: @LABRCNTIP (procalcitonin:4,lacticidven:4)  )No results found for this or any previous visit (from the past 240 hour(s)).       Radiology Studies: Mr Brain Wo Contrast  Result Date: 01/15/2018 CLINICAL DATA:  Left arm weakness EXAM: MRI HEAD WITHOUT CONTRAST MRI CERVICAL SPINE WITHOUT CONTRAST TECHNIQUE: Multiplanar, multiecho pulse sequences of the brain and surrounding structures, and cervical spine, to include the craniocervical junction and cervicothoracic junction, were obtained without intravenous contrast. COMPARISON:  Brain MRI 04/04/2015 FINDINGS: MRI HEAD FINDINGS BRAIN: There is no acute infarct, acute hemorrhage, hydrocephalus or extra-axial collection. The midline structures are normal. No midline shift or other mass effect. There are no old infarcts. The white matter signal is normal for the patient's age. The cerebral and cerebellar volume are age-appropriate. Susceptibility-sensitive sequences show no chronic microhemorrhage or superficial siderosis. VASCULAR: Major intracranial arterial and venous sinus flow voids are normal. SKULL AND UPPER CERVICAL SPINE: Calvarial bone marrow signal is normal. There is no skull base mass. Visualized upper cervical spine and soft tissues are normal. SINUSES/ORBITS: No fluid levels or advanced mucosal thickening. No mastoid or middle ear effusion. The orbits are normal. MRI CERVICAL SPINE FINDINGS Alignment: Normal. Vertebrae: C7 hemangioma. No compression fracture or discitis-osteomyelitis. Cord: Normal caliber and signal. Posterior Fossa, vertebral arteries,  paraspinal tissues: Visualized posterior fossa is normal. Vertebral artery flow voids are preserved. No prevertebral effusion. Disc levels: Sagittal imaging includes the atlantoaxial joint to the level of the T1-2 disc space, with axial imaging of the disc spaces from C2-3 to T1-T2. C2-3: Mild left facet hypertrophy. No disc bulge. No spinal canal stenosis. Mildly narrowed left neural foramen. C3-4: No spinal canal or neural foraminal stenosis. C4-5: Normal disc.  No stenosis. C5-6: Small disc bulge. Normal facets. No spinal canal or neural foraminal stenosis. C6-7: No disc herniation or stenosis. C7-T1: Normal. T1-2: Normal. Ax GRE images of the cervical spine were not obtained, which mildly degrades assessment of the neural foramina. However, T2-weighted imaging typically overestimates foraminal stenosis. So, given the lack of stenosis on the provided images, I do not think returning for GRE images would be particularly helpful. IMPRESSION: 1. Normal MRI of the brain. 2. Mild cervical degenerative disc disease with mild narrowing of the left C2-3 foramen. Small disc bulge at C5-6 without stenosis. Electronically Signed   By: Ulyses Jarred M.D.   On: 01/15/2018 20:58   Mr Cervical Spine Wo Contrast  Result Date: 01/15/2018 CLINICAL DATA:  Left  arm weakness EXAM: MRI HEAD WITHOUT CONTRAST MRI CERVICAL SPINE WITHOUT CONTRAST TECHNIQUE: Multiplanar, multiecho pulse sequences of the brain and surrounding structures, and cervical spine, to include the craniocervical junction and cervicothoracic junction, were obtained without intravenous contrast. COMPARISON:  Brain MRI 04/04/2015 FINDINGS: MRI HEAD FINDINGS BRAIN: There is no acute infarct, acute hemorrhage, hydrocephalus or extra-axial collection. The midline structures are normal. No midline shift or other mass effect. There are no old infarcts. The white matter signal is normal for the patient's age. The cerebral and cerebellar volume are age-appropriate.  Susceptibility-sensitive sequences show no chronic microhemorrhage or superficial siderosis. VASCULAR: Major intracranial arterial and venous sinus flow voids are normal. SKULL AND UPPER CERVICAL SPINE: Calvarial bone marrow signal is normal. There is no skull base mass. Visualized upper cervical spine and soft tissues are normal. SINUSES/ORBITS: No fluid levels or advanced mucosal thickening. No mastoid or middle ear effusion. The orbits are normal. MRI CERVICAL SPINE FINDINGS Alignment: Normal. Vertebrae: C7 hemangioma. No compression fracture or discitis-osteomyelitis. Cord: Normal caliber and signal. Posterior Fossa, vertebral arteries, paraspinal tissues: Visualized posterior fossa is normal. Vertebral artery flow voids are preserved. No prevertebral effusion. Disc levels: Sagittal imaging includes the atlantoaxial joint to the level of the T1-2 disc space, with axial imaging of the disc spaces from C2-3 to T1-T2. C2-3: Mild left facet hypertrophy. No disc bulge. No spinal canal stenosis. Mildly narrowed left neural foramen. C3-4: No spinal canal or neural foraminal stenosis. C4-5: Normal disc.  No stenosis. C5-6: Small disc bulge. Normal facets. No spinal canal or neural foraminal stenosis. C6-7: No disc herniation or stenosis. C7-T1: Normal. T1-2: Normal. Ax GRE images of the cervical spine were not obtained, which mildly degrades assessment of the neural foramina. However, T2-weighted imaging typically overestimates foraminal stenosis. So, given the lack of stenosis on the provided images, I do not think returning for GRE images would be particularly helpful. IMPRESSION: 1. Normal MRI of the brain. 2. Mild cervical degenerative disc disease with mild narrowing of the left C2-3 foramen. Small disc bulge at C5-6 without stenosis. Electronically Signed   By: Ulyses Jarred M.D.   On: 01/15/2018 20:58   Dg Chest Port 1 View  Result Date: 01/15/2018 CLINICAL DATA:  Chest pain.  Cough. EXAM: PORTABLE CHEST 1  VIEW COMPARISON:  Radiographs of October 02, 2017. FINDINGS: Stable cardiomegaly. No pneumothorax or pleural effusion is noted. Both lungs are clear. The visualized skeletal structures are unremarkable. IMPRESSION: No acute cardiopulmonary abnormality seen. Electronically Signed   By: Marijo Conception, M.D.   On: 01/15/2018 13:39        Scheduled Meds: . amLODipine  5 mg Oral Daily  . aspirin EC  81 mg Oral Daily  . atorvastatin  40 mg Oral q1800  . escitalopram  10 mg Oral Daily  . guaiFENesin  600 mg Oral BID  . nitroGLYCERIN      . pantoprazole  40 mg Oral Daily  . rivaroxaban  20 mg Oral q1800   Continuous Infusions:   LOS: 0 days    Time spent:35 min    Benito Mccreedy, MD [facility] Pager [phone] a  If 7PM-7AM, please contact night-coverage [email or web address] Password [facility] 01/16/2018, 10:51 AM

## 2018-01-16 NOTE — Discharge Instructions (Addendum)

## 2018-01-17 DIAGNOSIS — I200 Unstable angina: Secondary | ICD-10-CM

## 2018-01-17 MED ORDER — SODIUM CHLORIDE 0.9 % WEIGHT BASED INFUSION
1.0000 mL/kg/h | INTRAVENOUS | Status: DC
  Administered 2018-01-18 (×2): 1 mL/kg/h via INTRAVENOUS

## 2018-01-17 MED ORDER — SODIUM CHLORIDE 0.9% FLUSH: 3.0000 mL | INTRAVENOUS | Status: DC | PRN

## 2018-01-17 MED ORDER — ASPIRIN 81 MG PO CHEW
81.0000 mg | CHEWABLE_TABLET | ORAL | Status: AC
  Administered 2018-01-18: 81 mg via ORAL
  Filled 2018-01-17: qty 1

## 2018-01-17 MED ORDER — SODIUM CHLORIDE 0.9 % IV SOLN: 250.0000 mL | INTRAVENOUS | Status: DC | PRN

## 2018-01-17 MED ORDER — SODIUM CHLORIDE 0.9 % WEIGHT BASED INFUSION
3.0000 mL/kg/h | INTRAVENOUS | Status: DC
  Administered 2018-01-18: 3 mL/kg/h via INTRAVENOUS

## 2018-01-17 MED ORDER — SODIUM CHLORIDE 0.9% FLUSH
3.0000 mL | Freq: Two times a day (BID) | INTRAVENOUS | Status: DC
  Administered 2018-01-17: 3 mL via INTRAVENOUS

## 2018-01-17 NOTE — Evaluation (Signed)
Occupational Therapy Evaluation Patient Details Name: Diane Barker MRN: [id number] DOB: 1950-11-03 Today's Date: 01/17/2018    History of Present Illness Diane Barker is a 67 y.o. female with medical history significant of CAD,   chronic bronchitis/COPD, GERD, hx breast cancer, Hx DVT/ PE on Xarelto. CHF with grade 1 diastolic CHF presenting with chest pain.   Clinical Impression   PTA, pt was living with alone and was independent; daughter planning to stay at dc. Currently, pt performing ADLs and functional mobility at Mod I level with increased time as needed. Pt presenting with decreased activity toelrance as seen by fatigue and SOB with activity; HR 80s and Spo2 98-97% on RA. Pt reports she has planned surgery tomorrow (RN reports cath scheduled for tomorrow). Pt would benefit from further acute OT to provide education on energy conservation during ADLs; will follow post-cath. Recommend dc home once medically stable per physician.     Follow Up Recommendations  No OT follow up;Supervision/Assistance - 24 hour(Pending surgery)    Equipment Recommendations  None recommended by OT    Recommendations for Other Services       Precautions / Restrictions Precautions Precautions: None Restrictions Weight Bearing Restrictions: No      Mobility Bed Mobility Overal bed mobility: Independent                Transfers Overall transfer level: Independent                    Balance Overall balance assessment: Mild deficits observed, not formally tested                                         ADL either performed or assessed with clinical judgement   ADL Overall ADL's : Modified independent                                       General ADL Comments: Pt performing ADLs and functional mobiltiy at Mod I level with increased time as needed. Pt reporting SOB and fatigue with functional mobiltiy in hallway. Pt would benefit from [facility] eduaction  and handout. Noting lateral lean to right during funcitonal mobility; pt reports she does this at home     Vision Baseline Vision/History: Wears glasses Wears Glasses: At all times Patient Visual Report: No change from baseline       Perception     Praxis      Pertinent Vitals/Pain Pain Assessment: No/denies pain     Hand Dominance     Extremity/Trunk Assessment Upper Extremity Assessment Upper Extremity Assessment: Overall WFL for tasks assessed   Lower Extremity Assessment Lower Extremity Assessment: Overall WFL for tasks assessed   Cervical / Trunk Assessment Cervical / Trunk Assessment: Normal   Communication Communication Communication: No difficulties   Cognition Arousal/Alertness: Awake/alert Behavior During Therapy: WFL for tasks assessed/performed Overall Cognitive Status: Within Functional Limits for tasks assessed                                     General Comments  SpO2 98-97% on RA. HR 80s    Exercises     Shoulder Instructions      Home Living Family/patient expects to be discharged  to:: Private residence Living Arrangements: Alone Available Help at Discharge: Family Type of Home: House Home Access: Stairs to enter Technical brewer of Steps: 1   [place]: One level     Bathroom Shower/Tub: Teacher, early years/pre: Handicapped height     Home Equipment: Building services engineer Comments: Daughter plans on staying at [employer]      Prior Functioning/Environment Level of Independence: Independent                 OT Problem List: Decreased activity tolerance;Impaired balance (sitting and/or standing);Decreased knowledge of use of DME or AE;Cardiopulmonary status limiting activity      OT Treatment/Interventions: Self-care/ADL training;Therapeutic exercise;Energy conservation;DME and/or AE instruction;Therapeutic activities;Patient/family education    OT Goals(Current goals can be found in the care  plan section) Acute Rehab OT Goals Patient Stated Goal: "Have a successful surgery" OT Goal Formulation: With patient Time For Goal Achievement: 01/31/18 Potential to Achieve Goals: Good  OT Frequency: Min 2X/week   Barriers to D/C:            Co-evaluation              AM-PAC PT "6 Clicks" Daily Activity     Outcome Measure Help from another Barker eating meals?: None Help from another Barker taking care of personal grooming?: None Help from another Barker toileting, which includes using toliet, bedpan, or urinal?: None Help from another Barker bathing (including washing, rinsing, drying)?: None Help from another Barker to put on and taking off regular upper body clothing?: None Help from another Barker to put on and taking off regular lower body clothing?: None 6 Click Score: 24   End of Session Nurse Communication: Mobility status  Activity Tolerance: Patient tolerated treatment well Patient left: in chair;with call bell/phone within reach  OT Visit Diagnosis: Unsteadiness on feet (R26.81)                Time: 3419-6222 OT Time Calculation (min): 13 min Charges:  OT General Charges $OT Visit: 1 Visit OT Evaluation $OT Eval Low Complexity: [place], OTR/L Acute Rehab Pager: [phone] Office: [place] 01/17/2018, 2:59 PM

## 2018-01-17 NOTE — Progress Notes (Signed)
Progress Note  Patient Name: Diane Barker Date of Encounter: 01/17/2018  Primary Cardiologist: No primary care provider on file.  Subjective   Currently no chest pain.  Inpatient Medications    Scheduled Meds: . amLODipine  5 mg Oral Daily  . aspirin EC  81 mg Oral Daily  . atorvastatin  40 mg Oral q1800  . escitalopram  10 mg Oral Daily  . guaiFENesin  600 mg Oral BID  . pantoprazole  40 mg Oral Daily   Continuous Infusions:  PRN Meds: acetaminophen **OR** acetaminophen, bisacodyl, HYDROcodone-acetaminophen, levalbuterol, ondansetron **OR** ondansetron (ZOFRAN) IV, polyethylene glycol   Vital Signs    Vitals:   01/16/18 1947 01/16/18 2036 01/17/18 0601 01/17/18 0805  BP: (!) 103/56  135/72 135/75  Pulse: 71  64   Resp: 18  16   Temp: 98.6 F (37 C)  98 F (36.7 C)   TempSrc: Oral  Oral   SpO2: 100% 100% 97%   Weight:   117.5 kg   Height:        Intake/Output Summary (Last 24 hours) at 01/17/2018 1129 Last data filed at 01/16/2018 2124 Gross per 24 hour  Intake 600 ml  Output -  Net 600 ml   Filed Weights   01/15/18 1131 01/16/18 0356 01/17/18 0601  Weight: 118.8 kg 117.8 kg 117.5 kg    Telemetry    Sinus rhythm - Personally Reviewed  ECG    No new- Personally Reviewed  Physical Exam   GEN: No acute distress.   Neck: No JVD Cardiac: RRR, no murmurs, rubs, or gallops.  Respiratory: Clear to auscultation bilaterally. GI: Soft, nontender, non-distended  MS: No edema; No deformity. Neuro:  Nonfocal  Psych: Normal affect   Labs    Chemistry Recent Labs  Lab 01/15/18 1211 01/16/18 0238  NA 139 140  K 3.6 3.9  CL 113* 115*  CO2 24 22  GLUCOSE 98 95  BUN 14 11  CREATININE 0.95 0.94  CALCIUM 8.3* 8.0*  PROT  --  5.9*  ALBUMIN  --  3.0*  AST  --  13*  ALT  --  10  ALKPHOS  --  49  BILITOT  --  0.6  GFRNONAA >60 >60  GFRAA >60 >60  ANIONGAP 2* 3*     Hematology Recent Labs  Lab 01/15/18 1211 01/16/18 0238  WBC 4.6 5.0  RBC  3.96 3.69*  HGB 12.9 11.9*  HCT 39.8 36.5  MCV 100.5* 98.9  MCH 32.6 32.2  MCHC 32.4 32.6  RDW 12.4 12.4  PLT 206 196    Cardiac Enzymes Recent Labs  Lab 01/15/18 1521 01/15/18 2049 01/16/18 0238  TROPONINI <0.03 <0.03 <0.03    Recent Labs  Lab 01/15/18 1201  TROPIPOC 0.01     BNPNo results for input(s): BNP, PROBNP in the last 168 hours.   DDimer  Recent Labs  Lab 01/15/18 1237  DDIMER <0.27     Radiology    Mr Brain Wo Contrast  Result Date: 01/15/2018 CLINICAL DATA:  Left arm weakness EXAM: MRI HEAD WITHOUT CONTRAST MRI CERVICAL SPINE WITHOUT CONTRAST TECHNIQUE: Multiplanar, multiecho pulse sequences of the brain and surrounding structures, and cervical spine, to include the craniocervical junction and cervicothoracic junction, were obtained without intravenous contrast. COMPARISON:  Brain MRI 04/04/2015 FINDINGS: MRI HEAD FINDINGS BRAIN: There is no acute infarct, acute hemorrhage, hydrocephalus or extra-axial collection. The midline structures are normal. No midline shift or other mass effect. There are no old infarcts. The  white matter signal is normal for the patient's age. The cerebral and cerebellar volume are age-appropriate. Susceptibility-sensitive sequences show no chronic microhemorrhage or superficial siderosis. VASCULAR: Major intracranial arterial and venous sinus flow voids are normal. SKULL AND UPPER CERVICAL SPINE: Calvarial bone marrow signal is normal. There is no skull base mass. Visualized upper cervical spine and soft tissues are normal. SINUSES/ORBITS: No fluid levels or advanced mucosal thickening. No mastoid or middle ear effusion. The orbits are normal. MRI CERVICAL SPINE FINDINGS Alignment: Normal. Vertebrae: C7 hemangioma. No compression fracture or discitis-osteomyelitis. Cord: Normal caliber and signal. Posterior Fossa, vertebral arteries, paraspinal tissues: Visualized posterior fossa is normal. Vertebral artery flow voids are preserved. No  prevertebral effusion. Disc levels: Sagittal imaging includes the atlantoaxial joint to the level of the T1-2 disc space, with axial imaging of the disc spaces from C2-3 to T1-T2. C2-3: Mild left facet hypertrophy. No disc bulge. No spinal canal stenosis. Mildly narrowed left neural foramen. C3-4: No spinal canal or neural foraminal stenosis. C4-5: Normal disc.  No stenosis. C5-6: Small disc bulge. Normal facets. No spinal canal or neural foraminal stenosis. C6-7: No disc herniation or stenosis. C7-T1: Normal. T1-2: Normal. Ax GRE images of the cervical spine were not obtained, which mildly degrades assessment of the neural foramina. However, T2-weighted imaging typically overestimates foraminal stenosis. So, given the lack of stenosis on the provided images, I do not think returning for GRE images would be particularly helpful. IMPRESSION: 1. Normal MRI of the brain. 2. Mild cervical degenerative disc disease with mild narrowing of the left C2-3 foramen. Small disc bulge at C5-6 without stenosis. Electronically Signed   By: Ulyses Jarred M.D.   On: 01/15/2018 20:58   Mr Cervical Spine Wo Contrast  Result Date: 01/15/2018 CLINICAL DATA:  Left arm weakness EXAM: MRI HEAD WITHOUT CONTRAST MRI CERVICAL SPINE WITHOUT CONTRAST TECHNIQUE: Multiplanar, multiecho pulse sequences of the brain and surrounding structures, and cervical spine, to include the craniocervical junction and cervicothoracic junction, were obtained without intravenous contrast. COMPARISON:  Brain MRI 04/04/2015 FINDINGS: MRI HEAD FINDINGS BRAIN: There is no acute infarct, acute hemorrhage, hydrocephalus or extra-axial collection. The midline structures are normal. No midline shift or other mass effect. There are no old infarcts. The white matter signal is normal for the patient's age. The cerebral and cerebellar volume are age-appropriate. Susceptibility-sensitive sequences show no chronic microhemorrhage or superficial siderosis. VASCULAR: Major  intracranial arterial and venous sinus flow voids are normal. SKULL AND UPPER CERVICAL SPINE: Calvarial bone marrow signal is normal. There is no skull base mass. Visualized upper cervical spine and soft tissues are normal. SINUSES/ORBITS: No fluid levels or advanced mucosal thickening. No mastoid or middle ear effusion. The orbits are normal. MRI CERVICAL SPINE FINDINGS Alignment: Normal. Vertebrae: C7 hemangioma. No compression fracture or discitis-osteomyelitis. Cord: Normal caliber and signal. Posterior Fossa, vertebral arteries, paraspinal tissues: Visualized posterior fossa is normal. Vertebral artery flow voids are preserved. No prevertebral effusion. Disc levels: Sagittal imaging includes the atlantoaxial joint to the level of the T1-2 disc space, with axial imaging of the disc spaces from C2-3 to T1-T2. C2-3: Mild left facet hypertrophy. No disc bulge. No spinal canal stenosis. Mildly narrowed left neural foramen. C3-4: No spinal canal or neural foraminal stenosis. C4-5: Normal disc.  No stenosis. C5-6: Small disc bulge. Normal facets. No spinal canal or neural foraminal stenosis. C6-7: No disc herniation or stenosis. C7-T1: Normal. T1-2: Normal. Ax GRE images of the cervical spine were not obtained, which mildly degrades assessment of the  neural foramina. However, T2-weighted imaging typically overestimates foraminal stenosis. So, given the lack of stenosis on the provided images, I do not think returning for GRE images would be particularly helpful. IMPRESSION: 1. Normal MRI of the brain. 2. Mild cervical degenerative disc disease with mild narrowing of the left C2-3 foramen. Small disc bulge at C5-6 without stenosis. Electronically Signed   By: Ulyses Jarred M.D.   On: 01/15/2018 20:58   Ct Coronary Morph W/cta Cor W/score W/ca W/cm &/or Wo/cm  Addendum Date: 01/16/2018   ADDENDUM REPORT: 01/16/2018 13:44 HISTORY: Chest pain, coronary calcium EXAM: Cardiac/Coronary  CT TECHNIQUE: The patient was  scanned on a [employer]. PROTOCOL: A 120 kV prospective scan was triggered in the descending thoracic aorta at 111 HU's. Axial non-contrast 3 mm slices were carried out through the heart. The data set was analyzed on a dedicated work station and scored using the [employer]. Gantry rotation speed was 250 msecs and collimation was .6 mm. No beta blockade and 0.8 mg of sl NTG was given. The 3D data set was reconstructed in 5% intervals of the 67-82 % of the R-R cycle. Diastolic phases were analyzed on a dedicated work station using MPR, MIP and VRT modes. The patient received 80 cc of contrast. FINDINGS: Coronary calcium score: The patient's coronary artery calcium score is 455, which places the patient in the 93 percentile. This is higher than expected for age and gender matched peers. Coronary arteries: Normal coronary origins.  Right dominance. Right Coronary Artery: Minimal mixed atherosclerotic plaque in the proximal, mid and distal RCA (<25% stenosis). Patent medium caliber PDA and patent small caliber PL branch. Left Main Coronary Artery: Minimal irregular plaque (<25% stenosis) Left Anterior Descending Coronary Artery: There is a moderate heavily calcified plaque from proximal to mid LAD with 50-69% stenosis. There is a severe mixed atherosclerotic plaque in the mid LAD, 70-99% stenosis. There is a moderate calcified lesion immediately distal, and the distal LAD diffusely disease with mild atherosclerotic plaque (25-49% stenosis). Patent small caliber diagonal branches. Left Circumflex Artery: Minimal scattered plaque (<25% stenosis) in the circumflex artery. There is a mild mixed atherosclerotic plaque at the ostium of OM1 (25-49% stenosis). Patent distal vessel. Aorta: Normal size. No calcifications. No dissection. Likely mildly tortuous aorta, arch not imaged for sidedness. Aortic Valve: No calcifications. Other findings: Normal pulmonary vein drainage into the left atrium. Normal let atrial  appendage without a thrombus. Normal size of the pulmonary artery. Mild mitral annular calcification. IMPRESSION: 1. The patient's coronary artery calcium score is 455, which places the patient in the 93 percentile. This is higher than expected for age and gender matched peers. 2. Normal coronary origin with right dominance. 3.  Severe CAD of the mid LAD, CADRADS = 4. Electronically Signed   By: Cherlynn Kaiser   On: 01/16/2018 13:44   Result Date: 01/16/2018 EXAM: OVER-READ INTERPRETATION  CT CHEST The following report is an over-read performed by radiologist Dr. Suzy Bouchard of [facility], [place] on 01/16/2018. This over-read does not include interpretation of cardiac or coronary anatomy or pathology. The coronary CTA interpretation by the cardiologist is attached. COMPARISON:  None. FINDINGS: Limited view of the lung parenchyma demonstrates no suspicious nodularity. Airways are normal. Limited view of the mediastinum demonstrates no adenopathy. Esophagus normal. Limited view of the upper abdomen unremarkable. Limited view of the skeleton and chest wall is unremarkable. Degenerative osteophytosis of the spine. IMPRESSION: No significant extracardiac findings. Electronically Signed: By: Helane Gunther.D.  On: 01/16/2018 12:57   Dg Chest Port 1 View  Result Date: 01/15/2018 CLINICAL DATA:  Chest pain.  Cough. EXAM: PORTABLE CHEST 1 VIEW COMPARISON:  Radiographs of October 02, 2017. FINDINGS: Stable cardiomegaly. No pneumothorax or pleural effusion is noted. Both lungs are clear. The visualized skeletal structures are unremarkable. IMPRESSION: No acute cardiopulmonary abnormality seen. Electronically Signed   By: Marijo Conception, M.D.   On: 01/15/2018 13:39    Cardiac Studies  Coronary CTA 01/16/18  IMPRESSION: 1. The patient's coronary artery calcium score is 455, which places the patient in the 93 percentile. This is higher than expected for age and gender matched peers.  2. Normal  coronary origin with right dominance.  3.  Severe CAD of the mid LAD, CADRADS = 4.  Echo 01/16/18 Study Conclusions  - Procedure narrative: Transthoracic echocardiography. Image   quality was poor. The study was technically difficult, as a   result of body habitus. Intravenous contrast (Definity) was   administered. - Left ventricle: The cavity size was normal. Wall thickness was   increased in a pattern of moderate LVH. Systolic function was   normal. The estimated ejection fraction was in the range of 55%   to 60%. Wall motion was normal; there were no regional wall   motion abnormalities. Doppler parameters are consistent with   abnormal left ventricular relaxation (grade 1 diastolic   dysfunction). The E/e&' ratio is >15, suggesting elevated LV   filling pressure. - Mitral valve: Calcified annulus. Mildly thickened leaflets .   There was trivial regurgitation. - Left atrium: The atrium was normal in size. - Inferior vena cava: The vessel was normal in size. The   respirophasic diameter changes were in the normal range (>= 50%),   consistent with normal central venous pressure.  Impressions:  - Technically difficult study. Definity contrast given LVEF 55-60%,   moderate LVH, no definite regional wall motion abnormalities,   grade 1 DD, elevated LV filling pressure, trivial MR, normal LA   size, normal IVC.   Patient Profile  Diane Barker is a 67 y.o. female with a PMH of HTN, GERD, PE on xarelto, depression, and breast CA s/p lumpectomy, who is being seen today for the evaluation of chest pain at the request of Dr. Roel Cluck.  Assessment & Plan    Principal Problem:   Unstable angina (HCC) Active Problems:   Obstructive chronic bronchitis with acute bronchitis (HCC)   CHEST PAIN   BREAST CANCER, HX OF   Pulmonary embolism (HCC)   Dyspnea on exertion   Chest pain  UA/DOE - she is having unstable angina responsive to nitroglycerin. She has limiting shortness of  breath at home over the last 3 months, progressive over 6 months. We will plan for coronary angiography Monday. -CT coronary angiogram shows a flow limiting lesions in the mid-LAD, confirmed by CT FFR (images reviewed, report pending). -Hold xarelto until cath, start again afterward. -start nitro gtt for continued CP.  INFORMED CONSENT: I have reviewed the risks, indications, and alternatives to cardiac catheterization, possible angioplasty, and stenting with the patient. Risks include but are not limited to bleeding, infection, vascular injury, stroke, myocardial infection, arrhythmia, kidney injury, radiation-related injury in the case of prolonged fluoroscopy use, emergency cardiac surgery, and death. The patient understands the risks of serious complication is 1-2 in 0254 with diagnostic cardiac cath and 1-2% or less with angioplasty/stenting.   Hx of PE -  Holding xarelto until cath is complete, please restart afterward  when safe after cath.     For questions or updates, please contact [facility] Please consult [email or web address] for contact info under      Signed, Elouise Munroe, MD  01/17/2018, 11:29 AM

## 2018-01-17 NOTE — Progress Notes (Signed)
PROGRESS NOTE    Diane Barker  YSA:[id number] DOB: Oct 04, 1950 DOA: 01/15/2018 PCP: Shon Baton, MD  Outpatient Specialists:    Brief Narrative:  Diane Barker a 67 y.o.femalewith a PMH of HTN, GERD, PE on xarelto, depression, and breast CA s/p lumpectomy,who is admitted for chest pain,  Associated with dyspnea on exertion and relieved by nitroglycerin   Assessment & Plan:   Principal Problem:   Unstable angina ([place]) Active Problems:   Obstructive chronic bronchitis with acute bronchitis (HCC)   CHEST PAIN   BREAST CANCER, HX OF   Pulmonary embolism (HCC)   Dyspnea on exertion   Chest pain  Unstable angina  Presents with progressive dyspnea on exertion chest pain relieved by nitroglycerin Cho- EF 55-60%, Grade 1 DDysfxn  H/o Severe CAD of the mid LAD, CADRADS = 4 CT coronary angiogram shows a flow limiting lesions in the mid-LAD, confirmed by CT FFR  NTG gtt for Chest pain Hold xarelto until cath tomorrow, start again afterward. Cardiology consulting   pulmonary embolism Hold xarelto until cath, start again afterward   DVT prophylaxis: SCD's Code Status: DNR/DNI Family Communication:  Disposition Plan:Home   Consultants:   cardiology   Procedures:   cardiac catheterization 01/17/2018 Antimicrobials:    Subjective: No chest pains, no f/c  Objective: Vitals:   01/16/18 1947 01/16/18 2036 01/17/18 0601 01/17/18 0805  BP: (!) 103/56  135/72 135/75  Pulse: 71  64   Resp: 18  16   Temp: 98.6 F (37 C)  98 F (36.7 C)   TempSrc: Oral  Oral   SpO2: 100% 100% 97%   Weight:   117.5 kg   Height:        Intake/Output Summary (Last 24 hours) at 01/17/2018 1002 Last data filed at 01/16/2018 2124 Gross per 24 hour  Intake 600 ml  Output -  Net 600 ml   Filed Weights   01/15/18 1131 01/16/18 0356 01/17/18 0601  Weight: 118.8 kg 117.8 kg 117.5 kg    Examination:  General exam: Appears calm and comfortable  Respiratory system: Clear to auscultation.  Respiratory effort normal. Cardiovascular system: S1 & S2 heard, RRR. No JVD, murmurs, rubs, gallops or clicks. No pedal edema. Gastrointestinal system: Abdomen is nondistended, soft and nontender. No organomegaly or masses felt. Normal bowel sounds heard. Central nervous system: Alert and oriented. No focal neurological deficits. Extremities: Symmetric 5 x 5 power. Skin: No rashes, lesions or ulcers Psychiatry: Judgement and insight appear normal. Mood & affect appropriate.     Data Reviewed: I have personally reviewed following labs and imaging studies  CBC: Recent Labs  Lab 01/15/18 1211 01/16/18 0238  WBC 4.6 5.0  HGB 12.9 11.9*  HCT 39.8 36.5  MCV 100.5* 98.9  PLT 206 323   Basic Metabolic Panel: Recent Labs  Lab 01/15/18 1211 01/16/18 0238  NA 139 140  K 3.6 3.9  CL 113* 115*  CO2 24 22  GLUCOSE 98 95  BUN 14 11  CREATININE 0.95 0.94  CALCIUM 8.3* 8.0*  MG  --  2.0  PHOS  --  2.9   GFR: Estimated Creatinine Clearance: 73.4 mL/min (by C-G formula based on SCr of 0.94 mg/dL). Liver Function Tests: Recent Labs  Lab 01/16/18 0238  AST 13*  ALT 10  ALKPHOS 49  BILITOT 0.6  PROT 5.9*  ALBUMIN 3.0*   No results for input(s): LIPASE, AMYLASE in the last 168 hours. No results for input(s): AMMONIA in the last 168 hours. Coagulation  Profile: No results for input(s): INR, PROTIME in the last 168 hours. Cardiac Enzymes: Recent Labs  Lab 01/15/18 1521 01/15/18 2049 01/16/18 0238  TROPONINI <0.03 <0.03 <0.03   BNP (last 3 results) No results for input(s): PROBNP in the last 8760 hours. HbA1C: Recent Labs    01/16/18 0238  HGBA1C 5.3   CBG: No results for input(s): GLUCAP in the last 168 hours. Lipid Profile: Recent Labs    01/16/18 0238  CHOL 182  HDL 46  LDLCALC 124*  TRIG 60  CHOLHDL 4.0   Thyroid Function Tests: Recent Labs    01/15/18 1621  TSH 2.974   Anemia Panel: No results for input(s): VITAMINB12, FOLATE, FERRITIN, TIBC,  IRON, RETICCTPCT in the last 72 hours. Urine analysis:    Component Value Date/Time   COLORURINE YELLOW 10/07/2016 [place] 10/07/2016 1437   LABSPEC 1.025 10/07/2016 1437   PHURINE 6.0 10/07/2016 1437   GLUCOSEU NEGATIVE 10/07/2016 1437   HGBUR NEGATIVE 10/07/2016 1437   BILIRUBINUR NEGATIVE 10/07/2016 1437   KETONESUR NEGATIVE 10/07/2016 1437   PROTEINUR NEGATIVE 10/07/2016 1437   UROBILINOGEN 1.0 11/13/2013 0304   NITRITE NEGATIVE 10/07/2016 1437   LEUKOCYTESUR NEGATIVE 10/07/2016 1437   Sepsis Labs: @LABRCNTIP (procalcitonin:4,lacticidven:4)  )No results found for this or any previous visit (from the past 240 hour(s)).       Radiology Studies: Mr Brain Wo Contrast  Result Date: 01/15/2018 CLINICAL DATA:  Left arm weakness EXAM: MRI HEAD WITHOUT CONTRAST MRI CERVICAL SPINE WITHOUT CONTRAST TECHNIQUE: Multiplanar, multiecho pulse sequences of the brain and surrounding structures, and cervical spine, to include the craniocervical junction and cervicothoracic junction, were obtained without intravenous contrast. COMPARISON:  Brain MRI 04/04/2015 FINDINGS: MRI HEAD FINDINGS BRAIN: There is no acute infarct, acute hemorrhage, hydrocephalus or extra-axial collection. The midline structures are normal. No midline shift or other mass effect. There are no old infarcts. The white matter signal is normal for the patient's age. The cerebral and cerebellar volume are age-appropriate. Susceptibility-sensitive sequences show no chronic microhemorrhage or superficial siderosis. VASCULAR: Major intracranial arterial and venous sinus flow voids are normal. SKULL AND UPPER CERVICAL SPINE: Calvarial bone marrow signal is normal. There is no skull base mass. Visualized upper cervical spine and soft tissues are normal. SINUSES/ORBITS: No fluid levels or advanced mucosal thickening. No mastoid or middle ear effusion. The orbits are normal. MRI CERVICAL SPINE FINDINGS Alignment: Normal.  Vertebrae: C7 hemangioma. No compression fracture or discitis-osteomyelitis. Cord: Normal caliber and signal. Posterior Fossa, vertebral arteries, paraspinal tissues: Visualized posterior fossa is normal. Vertebral artery flow voids are preserved. No prevertebral effusion. Disc levels: Sagittal imaging includes the atlantoaxial joint to the level of the T1-2 disc space, with axial imaging of the disc spaces from C2-3 to T1-T2. C2-3: Mild left facet hypertrophy. No disc bulge. No spinal canal stenosis. Mildly narrowed left neural foramen. C3-4: No spinal canal or neural foraminal stenosis. C4-5: Normal disc.  No stenosis. C5-6: Small disc bulge. Normal facets. No spinal canal or neural foraminal stenosis. C6-7: No disc herniation or stenosis. C7-T1: Normal. T1-2: Normal. Ax GRE images of the cervical spine were not obtained, which mildly degrades assessment of the neural foramina. However, T2-weighted imaging typically overestimates foraminal stenosis. So, given the lack of stenosis on the provided images, I do not think returning for GRE images would be particularly helpful. IMPRESSION: 1. Normal MRI of the brain. 2. Mild cervical degenerative disc disease with mild narrowing of the left C2-3 foramen. Small disc bulge at C5-6  without stenosis. Electronically Signed   By: Ulyses Jarred M.D.   On: 01/15/2018 20:58   Mr Cervical Spine Wo Contrast  Result Date: 01/15/2018 CLINICAL DATA:  Left arm weakness EXAM: MRI HEAD WITHOUT CONTRAST MRI CERVICAL SPINE WITHOUT CONTRAST TECHNIQUE: Multiplanar, multiecho pulse sequences of the brain and surrounding structures, and cervical spine, to include the craniocervical junction and cervicothoracic junction, were obtained without intravenous contrast. COMPARISON:  Brain MRI 04/04/2015 FINDINGS: MRI HEAD FINDINGS BRAIN: There is no acute infarct, acute hemorrhage, hydrocephalus or extra-axial collection. The midline structures are normal. No midline shift or other mass effect.  There are no old infarcts. The white matter signal is normal for the patient's age. The cerebral and cerebellar volume are age-appropriate. Susceptibility-sensitive sequences show no chronic microhemorrhage or superficial siderosis. VASCULAR: Major intracranial arterial and venous sinus flow voids are normal. SKULL AND UPPER CERVICAL SPINE: Calvarial bone marrow signal is normal. There is no skull base mass. Visualized upper cervical spine and soft tissues are normal. SINUSES/ORBITS: No fluid levels or advanced mucosal thickening. No mastoid or middle ear effusion. The orbits are normal. MRI CERVICAL SPINE FINDINGS Alignment: Normal. Vertebrae: C7 hemangioma. No compression fracture or discitis-osteomyelitis. Cord: Normal caliber and signal. Posterior Fossa, vertebral arteries, paraspinal tissues: Visualized posterior fossa is normal. Vertebral artery flow voids are preserved. No prevertebral effusion. Disc levels: Sagittal imaging includes the atlantoaxial joint to the level of the T1-2 disc space, with axial imaging of the disc spaces from C2-3 to T1-T2. C2-3: Mild left facet hypertrophy. No disc bulge. No spinal canal stenosis. Mildly narrowed left neural foramen. C3-4: No spinal canal or neural foraminal stenosis. C4-5: Normal disc.  No stenosis. C5-6: Small disc bulge. Normal facets. No spinal canal or neural foraminal stenosis. C6-7: No disc herniation or stenosis. C7-T1: Normal. T1-2: Normal. Ax GRE images of the cervical spine were not obtained, which mildly degrades assessment of the neural foramina. However, T2-weighted imaging typically overestimates foraminal stenosis. So, given the lack of stenosis on the provided images, I do not think returning for GRE images would be particularly helpful. IMPRESSION: 1. Normal MRI of the brain. 2. Mild cervical degenerative disc disease with mild narrowing of the left C2-3 foramen. Small disc bulge at C5-6 without stenosis. Electronically Signed   By: Ulyses Jarred  M.D.   On: 01/15/2018 20:58   Ct Coronary Morph W/cta Cor W/score W/ca W/cm &/or Wo/cm  Addendum Date: 01/16/2018   ADDENDUM REPORT: 01/16/2018 13:44 HISTORY: Chest pain, coronary calcium EXAM: Cardiac/Coronary  CT TECHNIQUE: The patient was scanned on a [employer]. PROTOCOL: A 120 kV prospective scan was triggered in the descending thoracic aorta at 111 HU's. Axial non-contrast 3 mm slices were carried out through the heart. The data set was analyzed on a dedicated work station and scored using the Leupp. Gantry rotation speed was 250 msecs and collimation was .6 mm. No beta blockade and 0.8 mg of sl NTG was given. The 3D data set was reconstructed in 5% intervals of the 67-82 % of the R-R cycle. Diastolic phases were analyzed on a dedicated work station using MPR, MIP and VRT modes. The patient received 80 cc of contrast. FINDINGS: Coronary calcium score: The patient's coronary artery calcium score is 455, which places the patient in the 93 percentile. This is higher than expected for age and gender matched peers. Coronary arteries: Normal coronary origins.  Right dominance. Right Coronary Artery: Minimal mixed atherosclerotic plaque in the proximal, mid and distal RCA (<25% stenosis).  Patent medium caliber PDA and patent small caliber PL branch. Left Main Coronary Artery: Minimal irregular plaque (<25% stenosis) Left Anterior Descending Coronary Artery: There is a moderate heavily calcified plaque from proximal to mid LAD with 50-69% stenosis. There is a severe mixed atherosclerotic plaque in the mid LAD, 70-99% stenosis. There is a moderate calcified lesion immediately distal, and the distal LAD diffusely disease with mild atherosclerotic plaque (25-49% stenosis). Patent small caliber diagonal branches. Left Circumflex Artery: Minimal scattered plaque (<25% stenosis) in the circumflex artery. There is a mild mixed atherosclerotic plaque at the ostium of OM1 (25-49% stenosis). Patent  distal vessel. Aorta: Normal size. No calcifications. No dissection. Likely mildly tortuous aorta, arch not imaged for sidedness. Aortic Valve: No calcifications. Other findings: Normal pulmonary vein drainage into the left atrium. Normal let atrial appendage without a thrombus. Normal size of the pulmonary artery. Mild mitral annular calcification. IMPRESSION: 1. The patient's coronary artery calcium score is 455, which places the patient in the 93 percentile. This is higher than expected for age and gender matched peers. 2. Normal coronary origin with right dominance. 3.  Severe CAD of the mid LAD, CADRADS = 4. Electronically Signed   By: Cherlynn Kaiser   On: 01/16/2018 13:44   Result Date: 01/16/2018 EXAM: OVER-READ INTERPRETATION  CT CHEST The following report is an over-read performed by radiologist Dr. Suzy Bouchard of [facility], [place] on 01/16/2018. This over-read does not include interpretation of cardiac or coronary anatomy or pathology. The coronary CTA interpretation by the cardiologist is attached. COMPARISON:  None. FINDINGS: Limited view of the lung parenchyma demonstrates no suspicious nodularity. Airways are normal. Limited view of the mediastinum demonstrates no adenopathy. Esophagus normal. Limited view of the upper abdomen unremarkable. Limited view of the skeleton and chest wall is unremarkable. Degenerative osteophytosis of the spine. IMPRESSION: No significant extracardiac findings. Electronically Signed: By: Suzy Bouchard M.D. On: 01/16/2018 12:57   Dg Chest Port 1 View  Result Date: 01/15/2018 CLINICAL DATA:  Chest pain.  Cough. EXAM: PORTABLE CHEST 1 VIEW COMPARISON:  Radiographs of October 02, 2017. FINDINGS: Stable cardiomegaly. No pneumothorax or pleural effusion is noted. Both lungs are clear. The visualized skeletal structures are unremarkable. IMPRESSION: No acute cardiopulmonary abnormality seen. Electronically Signed   By: Marijo Conception, M.D.   On: 01/15/2018 13:39         Scheduled Meds: . amLODipine  5 mg Oral Daily  . aspirin EC  81 mg Oral Daily  . atorvastatin  40 mg Oral q1800  . escitalopram  10 mg Oral Daily  . guaiFENesin  600 mg Oral BID  . pantoprazole  40 mg Oral Daily   Continuous Infusions:   LOS: 1 day    Time spent:20 min    Benito Mccreedy, MD [facility] Pager [phone] a  If 7PM-7AM, please contact night-coverage [email or web address] Password [email or web address] 01/17/2018, 10:02 AM

## 2018-01-18 ENCOUNTER — Encounter (HOSPITAL_COMMUNITY)
Admission: EM | Disposition: A | Discharge status: Home / Self Care | Payer: Self-pay | Source: Home / Self Care | Attending: Internal Medicine

## 2018-01-18 DIAGNOSIS — Z5181 Encounter for therapeutic drug level monitoring: Secondary | ICD-10-CM

## 2018-01-18 DIAGNOSIS — I2609 Other pulmonary embolism with acute cor pulmonale: Secondary | ICD-10-CM

## 2018-01-18 DIAGNOSIS — I2782 Chronic pulmonary embolism: Secondary | ICD-10-CM

## 2018-01-18 DIAGNOSIS — I25110 Atherosclerotic heart disease of native coronary artery with unstable angina pectoris: Principal | ICD-10-CM

## 2018-01-18 DIAGNOSIS — Z853 Personal history of malignant neoplasm of breast: Secondary | ICD-10-CM

## 2018-01-18 DIAGNOSIS — Z7901 Long term (current) use of anticoagulants: Secondary | ICD-10-CM

## 2018-01-18 DIAGNOSIS — I10 Essential (primary) hypertension: Secondary | ICD-10-CM

## 2018-01-18 HISTORY — PX: LEFT HEART CATH AND CORONARY ANGIOGRAPHY: CATH118249

## 2018-01-18 HISTORY — PX: CORONARY PRESSURE/FFR STUDY: CATH118243

## 2018-01-18 LAB — POCT ACTIVATED CLOTTING TIME: ACTIVATED CLOTTING TIME: 268 s

## 2018-01-18 SURGERY — LEFT HEART CATH AND CORONARY ANGIOGRAPHY
Anesthesia: LOCAL

## 2018-01-18 MED ORDER — ADENOSINE (DIAGNOSTIC) 140MCG/KG/MIN
INTRAVENOUS | Status: DC | PRN
  Administered 2018-01-18: 140 ug/kg/min via INTRAVENOUS

## 2018-01-18 MED ORDER — IOHEXOL 350 MG/ML SOLN
INTRAVENOUS | Status: DC | PRN
  Administered 2018-01-18: 75 mL via INTRA_ARTERIAL

## 2018-01-18 MED ORDER — LIDOCAINE HCL (PF) 1 % IJ SOLN
INTRAMUSCULAR | Status: AC
  Filled 2018-01-18: qty 30

## 2018-01-18 MED ORDER — VERAPAMIL HCL 2.5 MG/ML IV SOLN
INTRAVENOUS | Status: DC | PRN
  Administered 2018-01-18: 10 mL via INTRA_ARTERIAL

## 2018-01-18 MED ORDER — SODIUM CHLORIDE 0.9 % IV SOLN: INTRAVENOUS | Status: AC

## 2018-01-18 MED ORDER — SODIUM CHLORIDE 0.9% FLUSH
3.0000 mL | Freq: Two times a day (BID) | INTRAVENOUS | Status: DC
  Administered 2018-01-19: 3 mL via INTRAVENOUS

## 2018-01-18 MED ORDER — HEPARIN SODIUM (PORCINE) 1000 UNIT/ML IJ SOLN
INTRAMUSCULAR | Status: DC | PRN
  Administered 2018-01-18: 6000 [IU] via INTRAVENOUS
  Administered 2018-01-18: 5000 [IU] via INTRAVENOUS
  Administered 2018-01-18: 2000 [IU] via INTRAVENOUS

## 2018-01-18 MED ORDER — RIVAROXABAN 20 MG PO TABS: 20.0000 mg | ORAL_TABLET | Freq: Every day | ORAL | Status: DC

## 2018-01-18 MED ORDER — HEPARIN (PORCINE) IN NACL 1000-0.9 UT/500ML-% IV SOLN
INTRAVENOUS | Status: DC | PRN
  Administered 2018-01-18 (×2): 500 mL

## 2018-01-18 MED ORDER — FENTANYL CITRATE (PF) 100 MCG/2ML IJ SOLN
INTRAMUSCULAR | Status: AC
  Filled 2018-01-18: qty 2

## 2018-01-18 MED ORDER — HEPARIN (PORCINE) IN NACL 1000-0.9 UT/500ML-% IV SOLN
INTRAVENOUS | Status: AC
  Filled 2018-01-18: qty 1000

## 2018-01-18 MED ORDER — MIDAZOLAM HCL 2 MG/2ML IJ SOLN
INTRAMUSCULAR | Status: DC | PRN
  Administered 2018-01-18 (×2): 1 mg via INTRAVENOUS

## 2018-01-18 MED ORDER — LIDOCAINE HCL (PF) 1 % IJ SOLN
INTRAMUSCULAR | Status: DC | PRN
  Administered 2018-01-18: 2 mL

## 2018-01-18 MED ORDER — LABETALOL HCL 5 MG/ML IV SOLN: 10.0000 mg | INTRAVENOUS | Status: AC | PRN

## 2018-01-18 MED ORDER — METOPROLOL SUCCINATE ER 25 MG PO TB24
25.0000 mg | ORAL_TABLET | Freq: Every day | ORAL | Status: DC
  Administered 2018-01-19: 25 mg via ORAL
  Filled 2018-01-18: qty 1

## 2018-01-18 MED ORDER — FENTANYL CITRATE (PF) 100 MCG/2ML IJ SOLN
INTRAMUSCULAR | Status: DC | PRN
  Administered 2018-01-18 (×2): 25 ug via INTRAVENOUS

## 2018-01-18 MED ORDER — SODIUM CHLORIDE 0.9% FLUSH: 3.0000 mL | INTRAVENOUS | Status: DC | PRN

## 2018-01-18 MED ORDER — MIDAZOLAM HCL 2 MG/2ML IJ SOLN
INTRAMUSCULAR | Status: AC
  Filled 2018-01-18: qty 2

## 2018-01-18 MED ORDER — ADENOSINE 12 MG/4ML IV SOLN
INTRAVENOUS | Status: AC
  Filled 2018-01-18: qty 16

## 2018-01-18 MED ORDER — ISOSORBIDE MONONITRATE ER 30 MG PO TB24
30.0000 mg | ORAL_TABLET | Freq: Every day | ORAL | Status: DC
  Administered 2018-01-19: 30 mg via ORAL
  Filled 2018-01-18: qty 1

## 2018-01-18 MED ORDER — SODIUM CHLORIDE 0.9 % IV SOLN: 250.0000 mL | INTRAVENOUS | Status: DC | PRN

## 2018-01-18 MED ORDER — HYDRALAZINE HCL 20 MG/ML IJ SOLN: 5.0000 mg | INTRAMUSCULAR | Status: AC | PRN

## 2018-01-18 MED ORDER — VERAPAMIL HCL 2.5 MG/ML IV SOLN
INTRAVENOUS | Status: AC
  Filled 2018-01-18: qty 2

## 2018-01-18 SURGICAL SUPPLY — 12 items
CATH 5FR JL3.5 JR4 ANG PIG MP (CATHETERS) ×2 IMPLANT
CATH VISTA GUIDE 6FR XBLAD3.5 (CATHETERS) ×2 IMPLANT
DEVICE RAD COMP TR BAND LRG (VASCULAR PRODUCTS) ×2 IMPLANT
GLIDESHEATH SLEND SS 6F .021 (SHEATH) ×2 IMPLANT
GUIDEWIRE INQWIRE 1.5J.035X260 (WIRE) ×1 IMPLANT
GUIDEWIRE PRESSURE COMET II (WIRE) ×2 IMPLANT
INQWIRE 1.5J .035X260CM (WIRE) ×2
KIT ESSENTIALS PG (KITS) ×2 IMPLANT
KIT HEART LEFT (KITS) ×2 IMPLANT
PACK CARDIAC CATHETERIZATION (CUSTOM PROCEDURE TRAY) ×2 IMPLANT
TRANSDUCER W/STOPCOCK (MISCELLANEOUS) ×2 IMPLANT
TUBING CIL FLEX 10 FLL-RA (TUBING) ×2 IMPLANT

## 2018-01-18 NOTE — Progress Notes (Signed)
Progress Note  Patient Name: Diane Barker Date of Encounter: 01/18/2018  Primary Cardiologist: No primary care provider on file.  Subjective   No angina this am   Inpatient Medications    Scheduled Meds: . amLODipine  5 mg Oral Daily  . aspirin EC  81 mg Oral Daily  . atorvastatin  40 mg Oral q1800  . escitalopram  10 mg Oral Daily  . guaiFENesin  600 mg Oral BID  . pantoprazole  40 mg Oral Daily  . sodium chloride flush  3 mL Intravenous Q12H   Continuous Infusions: . sodium chloride    . sodium chloride 1 mL/kg/hr (01/18/18 0541)   PRN Meds: sodium chloride, acetaminophen **OR** acetaminophen, bisacodyl, HYDROcodone-acetaminophen, levalbuterol, ondansetron **OR** ondansetron (ZOFRAN) IV, polyethylene glycol, sodium chloride flush   Vital Signs    Vitals:   01/17/18 1457 01/17/18 1953 01/18/18 0547 01/18/18 0848  BP: 130/67 126/63 120/87 132/62  Pulse: 66 75 68   Resp:  17 19   Temp: 98.2 F (36.8 C) 98.2 F (36.8 C) 97.9 F (36.6 C)   TempSrc: Oral Oral Oral   SpO2: 98% 97% 99%   Weight:   117.3 kg   Height:       No intake or output data in the 24 hours ending 01/18/18 0851 Filed Weights   01/16/18 0356 01/17/18 0601 01/18/18 0547  Weight: 117.8 kg 117.5 kg 117.3 kg    Telemetry    Sinus rhythm - Personally Reviewed  ECG    No new- Personally Reviewed  Physical Exam   GEN: No acute distress.   Neck: No JVD Cardiac: RRR, no murmurs, rubs, or gallops.  Respiratory: Clear to auscultation bilaterally. GI: Soft, nontender, non-distended  MS: No edema; No deformity. Neuro:  Nonfocal  Psych: Normal affect   Labs    Chemistry Recent Labs  Lab 01/15/18 1211 01/16/18 0238  NA 139 140  K 3.6 3.9  CL 113* 115*  CO2 24 22  GLUCOSE 98 95  BUN 14 11  CREATININE 0.95 0.94  CALCIUM 8.3* 8.0*  PROT  --  5.9*  ALBUMIN  --  3.0*  AST  --  13*  ALT  --  10  ALKPHOS  --  49  BILITOT  --  0.6  GFRNONAA >60 >60  GFRAA >60 >60  ANIONGAP 2* 3*       Hematology Recent Labs  Lab 01/15/18 1211 01/16/18 0238  WBC 4.6 5.0  RBC 3.96 3.69*  HGB 12.9 11.9*  HCT 39.8 36.5  MCV 100.5* 98.9  MCH 32.6 32.2  MCHC 32.4 32.6  RDW 12.4 12.4  PLT 206 196    Cardiac Enzymes Recent Labs  Lab 01/15/18 1521 01/15/18 2049 01/16/18 0238  TROPONINI <0.03 <0.03 <0.03    Recent Labs  Lab 01/15/18 1201  TROPIPOC 0.01     BNPNo results for input(s): BNP, PROBNP in the last 168 hours.   DDimer  Recent Labs  Lab 01/15/18 1237  DDIMER <0.27     Radiology    Ct Coronary Morph W/cta Cor W/score W/ca W/cm &/or Wo/cm  Addendum Date: 01/16/2018   ADDENDUM REPORT: 01/16/2018 13:44 HISTORY: Chest pain, coronary calcium EXAM: Cardiac/Coronary  CT TECHNIQUE: The patient was scanned on a [employer]. PROTOCOL: A 120 kV prospective scan was triggered in the descending thoracic aorta at 111 HU's. Axial non-contrast 3 mm slices were carried out through the heart. The data set was analyzed on a dedicated work station and  scored using the Agatson method. Gantry rotation speed was 250 msecs and collimation was .6 mm. No beta blockade and 0.8 mg of sl NTG was given. The 3D data set was reconstructed in 5% intervals of the 67-82 % of the R-R cycle. Diastolic phases were analyzed on a dedicated work station using MPR, MIP and VRT modes. The patient received 80 cc of contrast. FINDINGS: Coronary calcium score: The patient's coronary artery calcium score is 455, which places the patient in the 93 percentile. This is higher than expected for age and gender matched peers. Coronary arteries: Normal coronary origins.  Right dominance. Right Coronary Artery: Minimal mixed atherosclerotic plaque in the proximal, mid and distal RCA (<25% stenosis). Patent medium caliber PDA and patent small caliber PL branch. Left Main Coronary Artery: Minimal irregular plaque (<25% stenosis) Left Anterior Descending Coronary Artery: There is a moderate heavily calcified  plaque from proximal to mid LAD with 50-69% stenosis. There is a severe mixed atherosclerotic plaque in the mid LAD, 70-99% stenosis. There is a moderate calcified lesion immediately distal, and the distal LAD diffusely disease with mild atherosclerotic plaque (25-49% stenosis). Patent small caliber diagonal branches. Left Circumflex Artery: Minimal scattered plaque (<25% stenosis) in the circumflex artery. There is a mild mixed atherosclerotic plaque at the ostium of OM1 (25-49% stenosis). Patent distal vessel. Aorta: Normal size. No calcifications. No dissection. Likely mildly tortuous aorta, arch not imaged for sidedness. Aortic Valve: No calcifications. Other findings: Normal pulmonary vein drainage into the left atrium. Normal let atrial appendage without a thrombus. Normal size of the pulmonary artery. Mild mitral annular calcification. IMPRESSION: 1. The patient's coronary artery calcium score is 455, which places the patient in the 93 percentile. This is higher than expected for age and gender matched peers. 2. Normal coronary origin with right dominance. 3.  Severe CAD of the mid LAD, CADRADS = 4. Electronically Signed   By: Cherlynn Kaiser   On: 01/16/2018 13:44   Result Date: 01/16/2018 EXAM: OVER-READ INTERPRETATION  CT CHEST The following report is an over-read performed by radiologist Dr. Suzy Bouchard of [facility], [place] on 01/16/2018. This over-read does not include interpretation of cardiac or coronary anatomy or pathology. The coronary CTA interpretation by the cardiologist is attached. COMPARISON:  None. FINDINGS: Limited view of the lung parenchyma demonstrates no suspicious nodularity. Airways are normal. Limited view of the mediastinum demonstrates no adenopathy. Esophagus normal. Limited view of the upper abdomen unremarkable. Limited view of the skeleton and chest wall is unremarkable. Degenerative osteophytosis of the spine. IMPRESSION: No significant extracardiac findings.  Electronically Signed: By: Suzy Bouchard M.D. On: 01/16/2018 12:57   Ct Coronary Fractional Flow Reserve Fluid Analysis  Result Date: 01/17/2018 EXAM: CT FFR ANALYSIS CLINICAL DATA:  Unstable angina FINDINGS: FFRct analysis was performed on the original cardiac CT angiogram dataset. Diagrammatic representation of the FFRct analysis is provided in a separate PDF document in PACS. This dictation was created using the PDF document and an interactive 3D model of the results. 3D model is not available in the EMR/PACS. Normal FFR range is >0.80. 1. Left Main:  No significant stenosis. FFR = 0.98 2. LAD: No significant stenosis. Proximal FFR = 0.96, Mid FFR = 0.77, Distal FFR = 0.68 3. LCX: No significant stenosis. Proximal FFR = 0.95, Distal FFR = 0.84, OM1 FFR = 0.84 4. RCA: No significant stenosis. Proximal FFR = 0.97, Mid FFR = 0.94, Distal FFR = 0.90 IMPRESSION: 1. CT FFR analysis showed flow limiting stenosis in  the mid and distal LAD, FFR = 0.77 after mid LAD lesion. Electronically Signed   By: Cherlynn Kaiser   On: 01/17/2018 13:08    Cardiac Studies  Coronary CTA 01/16/18  IMPRESSION: 1. The patient's coronary artery calcium score is 455, which places the patient in the 93 percentile. This is higher than expected for age and gender matched peers.  2. Normal coronary origin with right dominance.  3.  Severe CAD of the mid LAD, CADRADS = 4.  Echo 01/16/18 Study Conclusions  - Procedure narrative: Transthoracic echocardiography. Image   quality was poor. The study was technically difficult, as a   result of body habitus. Intravenous contrast (Definity) was   administered. - Left ventricle: The cavity size was normal. Wall thickness was   increased in a pattern of moderate LVH. Systolic function was   normal. The estimated ejection fraction was in the range of 55%   to 60%. Wall motion was normal; there were no regional wall   motion abnormalities. Doppler parameters are consistent  with   abnormal left ventricular relaxation (grade 1 diastolic   dysfunction). The E/e&' ratio is >15, suggesting elevated LV   filling pressure. - Mitral valve: Calcified annulus. Mildly thickened leaflets .   There was trivial regurgitation. - Left atrium: The atrium was normal in size. - Inferior vena cava: The vessel was normal in size. The   respirophasic diameter changes were in the normal range (>= 50%),   consistent with normal central venous pressure.  Impressions:  - Technically difficult study. Definity contrast given LVEF 55-60%,   moderate LVH, no definite regional wall motion abnormalities,   grade 1 DD, elevated LV filling pressure, trivial MR, normal LA   size, normal IVC.   Patient Profile  Diane Barker is a 67 y.o. female with a PMH of HTN, GERD, PE on xarelto, depression, and breast CA s/p lumpectomy, who is being seen for the evaluation of chest pain at the request of Dr. Roel Cluck.  Assessment & Plan    Principal Problem:   Unstable angina (HCC) Active Problems:   Obstructive chronic bronchitis with acute bronchitis (HCC)   CHEST PAIN   BREAST CANCER, HX OF   Pulmonary embolism (HCC)   Dyspnea on exertion   Chest pain  UA/DOE -  Cardiac CT with mid LAD stenosis and FFR CT .77 for cath today  Normally on xarelto for history of PE Will need DAT ? 3 months then plavix   HTN:  D/c amlodipine start low dose beta blocker depending on cath results    Hx of PE -  Holding xarelto until cath is complete, please restart afterward when safe after cath.     For questions or updates, please contact [place] Please consult [email or web address] for contact info under      Signed, Jenkins Rouge, MD  01/18/2018, 8:51 AM

## 2018-01-18 NOTE — Interval H&P Note (Signed)
History and Physical Interval Note:  01/18/2018 1:00 PM  Diane Barker  has presented today for cardiac cath with the diagnosis of CAD with unstable angina  The various methods of treatment have been discussed with the patient and family. After consideration of risks, benefits and other options for treatment, the patient has consented to  Procedure(s): LEFT HEART CATH AND CORONARY ANGIOGRAPHY (N/A) as a surgical intervention .  The patient's history has been reviewed, patient examined, no change in status, stable for surgery.  I have reviewed the patient's chart and labs.  Questions were answered to the patient's satisfaction.    Cath Lab Visit (complete for each Cath Lab visit)  Clinical Evaluation Leading to the Procedure:   ACS: No.  Non-ACS:    Anginal Classification: CCS III  Anti-ischemic medical therapy: No Therapy  Non-Invasive Test Results: No non-invasive testing performed  Prior CABG: No previous CABG        Lauree Chandler

## 2018-01-18 NOTE — Progress Notes (Signed)
No longer needs second site at this time already started

## 2018-01-18 NOTE — Progress Notes (Signed)
PROGRESS NOTE  Diane Barker  DXA:[id number] DOB: 05/21/1950 DOA: 01/15/2018 PCP: Shon Baton, MD   Brief Narrative: Diane Barker is a 67 y.o. female with a history of PE on xarelto, HTN, GERD, depression, and breast CA s/p lumpectomy who presented with chest pain and dyspnea on exertion relieved by nitroglycerin. Trop negative, Ddimer negative. CXR without acute findings. EKG with sinus rhythm, rate 60, isolated TWI in lead III, otherwise no STE/D. Cardiology was consulted for unstable angina, CT coronary angiogram demonstrated a flow limiting lesion in mid-LAD confirmed by FFR. Xarelto was held, nitroglycerin gtt was started and plan was made for catheterization. Cardiac catheterization 11/4 confirmed a proximal LAD stenosis of ~60% which seemed not to be flow limiting, so no intervention was performed.   Assessment & Plan: Principal Problem:   Unstable angina (HCC) Active Problems:   Obstructive chronic bronchitis with acute bronchitis (HCC)   CHEST PAIN   BREAST CANCER, HX OF   Pulmonary embolism (HCC)   Dyspnea on exertion   Chest pain  Unstable angina:  - ASA, beta blocker, imdur, statin - Per cardiology  History of PE:  - Ok to restart xarelto 11/5 per cardiology  HTN:  - Will stop norvasc since starting beta blocker and imdur.  Morbid obesity: BMI 43. Noted - Lifestyle modifications advised.   Depression:  - Continue SSRI  GERD:  - Continue PPI  DVT prophylaxis: SCDs Code Status: DNR Family Communication: None at bedside Disposition Plan: [place] home if stable 11/5.  Consultants:   Cardiology  Procedures:   LEFT HEART CATHETERIZATION 01/18/2018:  Ost RCA to Prox RCA lesion is 20% stenosed.  Ost RPDA to RPDA lesion is 20% stenosed.  Mid RCA lesion is 20% stenosed.  Prox Cx to Mid Cx lesion is 30% stenosed.  Prox LAD lesion is 60% stenosed.  Ost LAD to Prox LAD lesion is 10% stenosed.  Ost 1st Diag lesion is 30% stenosed.  Mid LAD lesion is  50% stenosed.  Ost 2nd Diag lesion is 50% stenosed.   1. Moderate, eccentric, calcified stenosis in the mid LAD at the takeoff of the first diagonal branch and moderate eccentric stenosis in the mid to distal LAD at the takeoff of the second diagonal branch. DFR and FFR analysis of the mid and distal LAD suggests the lesions are not flow limiting. In multiple angiographic views, the lesions appear to be moderate only.  2. Mild non-obstructive disease in the RCA and Circumflex  Recommendation: I would recommend a trial of medical therapy at this time given the moderate disease in her LAD. I will add a beta blocker and Imdur. If she continues to have angina despite optimal medical therapy, consideration could be given to PCI of the mid LAD. Resume DOAC tomorrow.   Antimicrobials:  None   Subjective: No angina this morning.  Objective: Vitals:   01/18/18 1354 01/18/18 1359 01/18/18 1404 01/18/18 1413  BP: 137/75 138/72 139/72 132/77  Pulse: 66 61 63 67  Resp: (!) 21 14 15 13   Temp:      TempSrc:      SpO2: 100% 100% 100% 100%  Weight:      Height:       No intake or output data in the 24 hours ending 01/18/18 1703 Filed Weights   01/16/18 0356 01/17/18 0601 01/18/18 0547  Weight: 117.8 kg 117.5 kg 117.3 kg    Gen: 67 y.o. female in no distress  Pulm: Non-labored breathing. Clear to auscultation bilaterally.  CV: Regular rate and rhythm. No murmur, rub, or gallop. No JVD, no pedal edema. GI: Abdomen soft, non-tender, non-distended, with normoactive bowel sounds. No organomegaly or masses felt. Ext: Warm, no deformities Skin: No rashes, lesions no ulcers Neuro: Alert and oriented. No focal neurological deficits. Psych: Judgement and insight appear normal. Mood & affect appropriate.   Data Reviewed: I have personally reviewed following labs and imaging studies  CBC: Recent Labs  Lab 01/15/18 1211 01/16/18 0238  WBC 4.6 5.0  HGB 12.9 11.9*  HCT 39.8 36.5  MCV 100.5*  98.9  PLT 206 297   Basic Metabolic Panel: Recent Labs  Lab 01/15/18 1211 01/16/18 0238  NA 139 140  K 3.6 3.9  CL 113* 115*  CO2 24 22  GLUCOSE 98 95  BUN 14 11  CREATININE 0.95 0.94  CALCIUM 8.3* 8.0*  MG  --  2.0  PHOS  --  2.9   GFR: Estimated Creatinine Clearance: 73.3 mL/min (by C-G formula based on SCr of 0.94 mg/dL). Liver Function Tests: Recent Labs  Lab 01/16/18 0238  AST 13*  ALT 10  ALKPHOS 49  BILITOT 0.6  PROT 5.9*  ALBUMIN 3.0*   No results for input(s): LIPASE, AMYLASE in the last 168 hours. No results for input(s): AMMONIA in the last 168 hours. Coagulation Profile: No results for input(s): INR, PROTIME in the last 168 hours. Cardiac Enzymes: Recent Labs  Lab 01/15/18 1521 01/15/18 2049 01/16/18 0238  TROPONINI <0.03 <0.03 <0.03   BNP (last 3 results) No results for input(s): PROBNP in the last 8760 hours. HbA1C: Recent Labs    01/16/18 0238  HGBA1C 5.3   CBG: No results for input(s): GLUCAP in the last 168 hours. Lipid Profile: Recent Labs    01/16/18 0238  CHOL 182  HDL 46  LDLCALC 124*  TRIG 60  CHOLHDL 4.0   Thyroid Function Tests: No results for input(s): TSH, T4TOTAL, FREET4, T3FREE, THYROIDAB in the last 72 hours. Anemia Panel: No results for input(s): VITAMINB12, FOLATE, FERRITIN, TIBC, IRON, RETICCTPCT in the last 72 hours. Urine analysis:    Component Value Date/Time   COLORURINE YELLOW 10/07/2016 [employer] 10/07/2016 1437   LABSPEC 1.025 10/07/2016 1437   PHURINE 6.0 10/07/2016 1437   GLUCOSEU NEGATIVE 10/07/2016 1437   HGBUR NEGATIVE 10/07/2016 1437   BILIRUBINUR NEGATIVE 10/07/2016 1437   KETONESUR NEGATIVE 10/07/2016 1437   PROTEINUR NEGATIVE 10/07/2016 1437   UROBILINOGEN 1.0 11/13/2013 0304   NITRITE NEGATIVE 10/07/2016 1437   LEUKOCYTESUR NEGATIVE 10/07/2016 1437   No results found for this or any previous visit (from the past 240 hour(s)).    Radiology Studies: No results  found.  Scheduled Meds: . amLODipine  5 mg Oral Daily  . aspirin EC  81 mg Oral Daily  . atorvastatin  40 mg Oral q1800  . escitalopram  10 mg Oral Daily  . guaiFENesin  600 mg Oral BID  . isosorbide mononitrate  30 mg Oral Daily  . metoprolol succinate  25 mg Oral Daily  . pantoprazole  40 mg Oral Daily  . sodium chloride flush  3 mL Intravenous Q12H   Continuous Infusions: . sodium chloride    . sodium chloride       LOS: 2 days   Time spent: 25 minutes.  Patrecia Pour, MD [facility] [email or web address] Password [email or web address] 01/18/2018, 5:03 PM

## 2018-01-18 NOTE — H&P (View-Only) (Signed)
Progress Note  Patient Name: Diane Barker Date of Encounter: 01/18/2018  Primary Cardiologist: No primary care provider on file.  Subjective   No angina this am   Inpatient Medications    Scheduled Meds: . amLODipine  5 mg Oral Daily  . aspirin EC  81 mg Oral Daily  . atorvastatin  40 mg Oral q1800  . escitalopram  10 mg Oral Daily  . guaiFENesin  600 mg Oral BID  . pantoprazole  40 mg Oral Daily  . sodium chloride flush  3 mL Intravenous Q12H   Continuous Infusions: . sodium chloride    . sodium chloride 1 mL/kg/hr (01/18/18 0541)   PRN Meds: sodium chloride, acetaminophen **OR** acetaminophen, bisacodyl, HYDROcodone-acetaminophen, levalbuterol, ondansetron **OR** ondansetron (ZOFRAN) IV, polyethylene glycol, sodium chloride flush   Vital Signs    Vitals:   01/17/18 1457 01/17/18 1953 01/18/18 0547 01/18/18 0848  BP: 130/67 126/63 120/87 132/62  Pulse: 66 75 68   Resp:  17 19   Temp: 98.2 F (36.8 C) 98.2 F (36.8 C) 97.9 F (36.6 C)   TempSrc: Oral Oral Oral   SpO2: 98% 97% 99%   Weight:   117.3 kg   Height:       No intake or output data in the 24 hours ending 01/18/18 0851 Filed Weights   01/16/18 0356 01/17/18 0601 01/18/18 0547  Weight: 117.8 kg 117.5 kg 117.3 kg    Telemetry    Sinus rhythm - Personally Reviewed  ECG    No new- Personally Reviewed  Physical Exam   GEN: No acute distress.   Neck: No JVD Cardiac: RRR, no murmurs, rubs, or gallops.  Respiratory: Clear to auscultation bilaterally. GI: Soft, nontender, non-distended  MS: No edema; No deformity. Neuro:  Nonfocal  Psych: Normal affect   Labs    Chemistry Recent Labs  Lab 01/15/18 1211 01/16/18 0238  NA 139 140  K 3.6 3.9  CL 113* 115*  CO2 24 22  GLUCOSE 98 95  BUN 14 11  CREATININE 0.95 0.94  CALCIUM 8.3* 8.0*  PROT  --  5.9*  ALBUMIN  --  3.0*  AST  --  13*  ALT  --  10  ALKPHOS  --  49  BILITOT  --  0.6  GFRNONAA >60 >60  GFRAA >60 >60  ANIONGAP 2* 3*       Hematology Recent Labs  Lab 01/15/18 1211 01/16/18 0238  WBC 4.6 5.0  RBC 3.96 3.69*  HGB 12.9 11.9*  HCT 39.8 36.5  MCV 100.5* 98.9  MCH 32.6 32.2  MCHC 32.4 32.6  RDW 12.4 12.4  PLT 206 196    Cardiac Enzymes Recent Labs  Lab 01/15/18 1521 01/15/18 2049 01/16/18 0238  TROPONINI <0.03 <0.03 <0.03    Recent Labs  Lab 01/15/18 1201  TROPIPOC 0.01     BNPNo results for input(s): BNP, PROBNP in the last 168 hours.   DDimer  Recent Labs  Lab 01/15/18 1237  DDIMER <0.27     Radiology    Ct Coronary Morph W/cta Cor W/score W/ca W/cm &/or Wo/cm  Addendum Date: 01/16/2018   ADDENDUM REPORT: 01/16/2018 13:44 HISTORY: Chest pain, coronary calcium EXAM: Cardiac/Coronary  CT TECHNIQUE: The patient was scanned on a [employer]. PROTOCOL: A 120 kV prospective scan was triggered in the descending thoracic aorta at 111 HU's. Axial non-contrast 3 mm slices were carried out through the heart. The data set was analyzed on a dedicated work station and  scored using the Agatson method. Gantry rotation speed was 250 msecs and collimation was .6 mm. No beta blockade and 0.8 mg of sl NTG was given. The 3D data set was reconstructed in 5% intervals of the 67-82 % of the R-R cycle. Diastolic phases were analyzed on a dedicated work station using MPR, MIP and VRT modes. The patient received 80 cc of contrast. FINDINGS: Coronary calcium score: The patient's coronary artery calcium score is 455, which places the patient in the 93 percentile. This is higher than expected for age and gender matched peers. Coronary arteries: Normal coronary origins.  Right dominance. Right Coronary Artery: Minimal mixed atherosclerotic plaque in the proximal, mid and distal RCA (<25% stenosis). Patent medium caliber PDA and patent small caliber PL branch. Left Main Coronary Artery: Minimal irregular plaque (<25% stenosis) Left Anterior Descending Coronary Artery: There is a moderate heavily calcified  plaque from proximal to mid LAD with 50-69% stenosis. There is a severe mixed atherosclerotic plaque in the mid LAD, 70-99% stenosis. There is a moderate calcified lesion immediately distal, and the distal LAD diffusely disease with mild atherosclerotic plaque (25-49% stenosis). Patent small caliber diagonal branches. Left Circumflex Artery: Minimal scattered plaque (<25% stenosis) in the circumflex artery. There is a mild mixed atherosclerotic plaque at the ostium of OM1 (25-49% stenosis). Patent distal vessel. Aorta: Normal size. No calcifications. No dissection. Likely mildly tortuous aorta, arch not imaged for sidedness. Aortic Valve: No calcifications. Other findings: Normal pulmonary vein drainage into the left atrium. Normal let atrial appendage without a thrombus. Normal size of the pulmonary artery. Mild mitral annular calcification. IMPRESSION: 1. The patient's coronary artery calcium score is 455, which places the patient in the 93 percentile. This is higher than expected for age and gender matched peers. 2. Normal coronary origin with right dominance. 3.  Severe CAD of the mid LAD, CADRADS = 4. Electronically Signed   By: Cherlynn Kaiser   On: 01/16/2018 13:44   Result Date: 01/16/2018 EXAM: OVER-READ INTERPRETATION  CT CHEST The following report is an over-read performed by radiologist Dr. Suzy Bouchard of [facility], [place] on 01/16/2018. This over-read does not include interpretation of cardiac or coronary anatomy or pathology. The coronary CTA interpretation by the cardiologist is attached. COMPARISON:  None. FINDINGS: Limited view of the lung parenchyma demonstrates no suspicious nodularity. Airways are normal. Limited view of the mediastinum demonstrates no adenopathy. Esophagus normal. Limited view of the upper abdomen unremarkable. Limited view of the skeleton and chest wall is unremarkable. Degenerative osteophytosis of the spine. IMPRESSION: No significant extracardiac findings.  Electronically Signed: By: Suzy Bouchard M.D. On: 01/16/2018 12:57   Ct Coronary Fractional Flow Reserve Fluid Analysis  Result Date: 01/17/2018 EXAM: CT FFR ANALYSIS CLINICAL DATA:  Unstable angina FINDINGS: FFRct analysis was performed on the original cardiac CT angiogram dataset. Diagrammatic representation of the FFRct analysis is provided in a separate PDF document in PACS. This dictation was created using the PDF document and an interactive 3D model of the results. 3D model is not available in the EMR/PACS. Normal FFR range is >0.80. 1. Left Main:  No significant stenosis. FFR = 0.98 2. LAD: No significant stenosis. Proximal FFR = 0.96, Mid FFR = 0.77, Distal FFR = 0.68 3. LCX: No significant stenosis. Proximal FFR = 0.95, Distal FFR = 0.84, OM1 FFR = 0.84 4. RCA: No significant stenosis. Proximal FFR = 0.97, Mid FFR = 0.94, Distal FFR = 0.90 IMPRESSION: 1. CT FFR analysis showed flow limiting stenosis in  the mid and distal LAD, FFR = 0.77 after mid LAD lesion. Electronically Signed   By: Cherlynn Kaiser   On: 01/17/2018 13:08    Cardiac Studies  Coronary CTA 01/16/18  IMPRESSION: 1. The patient's coronary artery calcium score is 455, which places the patient in the 93 percentile. This is higher than expected for age and gender matched peers.  2. Normal coronary origin with right dominance.  3.  Severe CAD of the mid LAD, CADRADS = 4.  Echo 01/16/18 Study Conclusions  - Procedure narrative: Transthoracic echocardiography. Image   quality was poor. The study was technically difficult, as a   result of body habitus. Intravenous contrast (Definity) was   administered. - Left ventricle: The cavity size was normal. Wall thickness was   increased in a pattern of moderate LVH. Systolic function was   normal. The estimated ejection fraction was in the range of 55%   to 60%. Wall motion was normal; there were no regional wall   motion abnormalities. Doppler parameters are consistent  with   abnormal left ventricular relaxation (grade 1 diastolic   dysfunction). The E/e&' ratio is >15, suggesting elevated LV   filling pressure. - Mitral valve: Calcified annulus. Mildly thickened leaflets .   There was trivial regurgitation. - Left atrium: The atrium was normal in size. - Inferior vena cava: The vessel was normal in size. The   respirophasic diameter changes were in the normal range (>= 50%),   consistent with normal central venous pressure.  Impressions:  - Technically difficult study. Definity contrast given LVEF 55-60%,   moderate LVH, no definite regional wall motion abnormalities,   grade 1 DD, elevated LV filling pressure, trivial MR, normal LA   size, normal IVC.   Patient Profile  Diane Barker is a 67 y.o. female with a PMH of HTN, GERD, PE on xarelto, depression, and breast CA s/p lumpectomy, who is being seen for the evaluation of chest pain at the request of Dr. Roel Cluck.  Assessment & Plan    Principal Problem:   Unstable angina (HCC) Active Problems:   Obstructive chronic bronchitis with acute bronchitis (HCC)   CHEST PAIN   BREAST CANCER, HX OF   Pulmonary embolism (HCC)   Dyspnea on exertion   Chest pain  UA/DOE -  Cardiac CT with mid LAD stenosis and FFR CT .77 for cath today  Normally on xarelto for history of PE Will need DAT ? 3 months then plavix   HTN:  D/c amlodipine start low dose beta blocker depending on cath results    Hx of PE -  Holding xarelto until cath is complete, please restart afterward when safe after cath.     For questions or updates, please contact [place] Please consult [email or web address] for contact info under      Signed, Jenkins Rouge, MD  01/18/2018, 8:51 AM

## 2018-01-19 ENCOUNTER — Encounter (HOSPITAL_COMMUNITY): Payer: Self-pay | Admitting: Cardiovascular Disease

## 2018-01-19 DIAGNOSIS — J440 Chronic obstructive pulmonary disease with acute lower respiratory infection: Secondary | ICD-10-CM

## 2018-01-19 LAB — CBC
HCT: 38.7 % (ref 36.0–46.0)
HEMOGLOBIN: 12.7 g/dL (ref 12.0–15.0)
MCH: 32.6 pg (ref 26.0–34.0)
MCHC: 32.8 g/dL (ref 30.0–36.0)
MCV: 99.2 fL (ref 80.0–100.0)
Platelets: 210 10*3/uL (ref 150–400)
RBC: 3.9 MIL/uL (ref 3.87–5.11)
RDW: 12.2 % (ref 11.5–15.5)
WBC: 7.2 10*3/uL (ref 4.0–10.5)
nRBC: 0 % (ref 0.0–0.2)

## 2018-01-19 LAB — BASIC METABOLIC PANEL
Anion gap: 5 (ref 5–15)
BUN: 13 mg/dL (ref 8–23)
CALCIUM: 8.3 mg/dL — AB (ref 8.9–10.3)
CO2: 19 mmol/L — ABNORMAL LOW (ref 22–32)
CREATININE: 0.97 mg/dL (ref 0.44–1.00)
Chloride: 113 mmol/L — ABNORMAL HIGH (ref 98–111)
GFR calc Af Amer: >60 mL/min (ref >60)
GFR calc non Af Amer: 59 mL/min — ABNORMAL LOW (ref >60)
GLUCOSE: 94 mg/dL (ref 70–99)
Potassium: 4.2 mmol/L (ref 3.5–5.1)
Sodium: 137 mmol/L (ref 135–145)

## 2018-01-19 MED ORDER — ASPIRIN 81 MG PO TBEC: 81.0000 mg | DELAYED_RELEASE_TABLET | Freq: Every day | ORAL | 0 refills | Status: DC

## 2018-01-19 MED ORDER — ISOSORBIDE MONONITRATE ER 30 MG PO TB24: 30.0000 mg | ORAL_TABLET | Freq: Every day | ORAL | 0 refills | Status: DC

## 2018-01-19 MED ORDER — ATORVASTATIN CALCIUM 40 MG PO TABS: 40.0000 mg | ORAL_TABLET | Freq: Every day | ORAL | 0 refills | Status: DC

## 2018-01-19 MED ORDER — METOPROLOL SUCCINATE ER 25 MG PO TB24: 25.0000 mg | ORAL_TABLET | Freq: Every day | ORAL | 0 refills | Status: DC

## 2018-01-19 NOTE — Discharge Summary (Signed)
Physician Discharge Summary  Diane Barker:[id number] DOB: March 28, 1950 DOA: 01/15/2018  PCP: Shon Baton, MD  Admit date: 01/15/2018 Discharge date: 01/19/2018  Admitted From: Home Disposition: Home   Recommendations for Outpatient Follow-up:  1. Follow up with PCP in 1-2 weeks 2. Continue to optimize medical management of CAD. If fails treatment, would need PCI.  Home Health: None Equipment/Devices: None Discharge Condition: Stable CODE STATUS: Full Diet recommendation: Heart healthy  Brief/Interim Summary: Diane Barker is a 67 y.o. female with a history of PE on xarelto, HTN, GERD, depression, and breast CA s/p lumpectomy who presented with chest pain and dyspnea on exertion relieved by nitroglycerin. Trop negative, Ddimer negative. CXR without acute findings. EKG with sinus rhythm, rate 60, isolated TWI in lead III, otherwise no STE/D. Cardiology was consulted for unstable angina, CT coronary angiogram demonstrated a flow limiting lesion in mid-LAD confirmed by FFR. Xarelto was held, nitroglycerin gtt was started and plan was made for catheterization. Cardiac catheterization 11/4 confirmed a proximal LAD stenosis of ~60% which seemed not to be flow limiting, so no intervention was performed. Aspirin and statin were started as well as metoprolol and imdur, and the patient was discharged home the next day in good condition.  Discharge Diagnoses:  Principal Problem:   Unstable angina (HCC) Active Problems:   Obstructive chronic bronchitis with acute bronchitis (HCC)   CHEST PAIN   BREAST CANCER, HX OF   Pulmonary embolism (HCC)   Dyspnea on exertion   Chest pain  Unstable angina and CAD:  - ASA, beta blocker, imdur, statin - Will need to follow up with cardiology  History of PE:  - Ok to restart xarelto 11/5 per cardiology  COPD: Continue home medications. No flare.   HTN:  - Started metoprolol and imdur.  Morbid obesity: BMI 43. Noted - Lifestyle modifications  advised.   Depression:  - Continue SSRI  GERD:  - Continue PPI  Discharge Instructions Discharge Instructions    Diet - low sodium heart healthy   Complete by:  As directed    Discharge instructions   Complete by:  As directed    You were evaluated and found to have coronary artery disease (CAD) which is going to be managed with medications for now. You will need to start the medications which were sent to your pharmacy below including aspirin, atorvastatin, metoprolol, and imdur. You will need to follow up with cardiology and your primary doctor or seek medical attention sooner if your symptoms return.   Increase activity slowly   Complete by:  As directed      Allergies as of 01/19/2018      Reactions   Erythromycin Other (See Comments)   REACTION: stomach cramps   Morphine Other (See Comments)   REACTION: Hallucinations   Penicillins Other (See Comments)   REACTION: unkkown, was a child   Relafen [nabumetone] Nausea Only      Medication List    TAKE these medications   aspirin 81 MG EC tablet Take 1 tablet (81 mg total) by mouth daily. Start taking on:  01/20/2018   atorvastatin 40 MG tablet Commonly known as:  LIPITOR Take 1 tablet (40 mg total) by mouth daily at 6 PM.   ciclopirox 0.77 % cream Commonly known as:  LOPROX Apply 1 application topically 2 (two) times daily.   dexlansoprazole 60 MG capsule Commonly known as:  DEXILANT Take 60 mg by mouth daily.   docusate sodium 100 MG capsule Commonly known as:  COLACE Take 100 mg by mouth 2 (two) times daily.   escitalopram 10 MG tablet Commonly known as:  LEXAPRO Take 10 mg by mouth.   Fluticasone-Umeclidin-Vilant 100-62.5-25 MCG/INH Aepb Inhale 1 puff into the lungs daily.   isosorbide mononitrate 30 MG 24 hr tablet Commonly known as:  IMDUR Take 1 tablet (30 mg total) by mouth daily. Start taking on:  01/20/2018   metoprolol succinate 25 MG 24 hr tablet Commonly known as:  TOPROL-XL Take 1 tablet  (25 mg total) by mouth daily. Start taking on:  01/20/2018   PROAIR HFA 108 (90 Base) MCG/ACT inhaler Generic drug:  albuterol Inhale 2 puffs into the lungs every 4 (four) hours as needed for wheezing or shortness of breath.   vitamin B-12 500 MCG tablet Commonly known as:  CYANOCOBALAMIN Take 500 mcg by mouth daily.   Vitamin D 2000 units Caps Take 4,000 Units by mouth daily.   XARELTO 20 MG Tabs tablet Generic drug:  rivaroxaban Take 1 tablet by mouth daily.      Follow-up Information    Shon Baton, MD Follow up.   Specialty:  Internal Medicine Contact information: [place] [address] [phone]        Elouise Munroe, MD. Schedule an appointment as soon as possible for a visit in 1 week(s).   Specialty:  Cardiology Contact information: [address] [place] [address] [phone]          Allergies  Allergen Reactions  . Erythromycin Other (See Comments)    REACTION: stomach cramps  . Morphine Other (See Comments)    REACTION: Hallucinations  . Penicillins Other (See Comments)    REACTION: unkkown, was a child  . Relafen [Nabumetone] Nausea Only    Consultations:  Cardiology  Procedures/Studies: Mr Brain Wo Contrast  Result Date: 01/15/2018 CLINICAL DATA:  Left arm weakness EXAM: MRI HEAD WITHOUT CONTRAST MRI CERVICAL SPINE WITHOUT CONTRAST TECHNIQUE: Multiplanar, multiecho pulse sequences of the brain and surrounding structures, and cervical spine, to include the craniocervical junction and cervicothoracic junction, were obtained without intravenous contrast. COMPARISON:  Brain MRI 04/04/2015 FINDINGS: MRI HEAD FINDINGS BRAIN: There is no acute infarct, acute hemorrhage, hydrocephalus or extra-axial collection. The midline structures are normal. No midline shift or other mass effect. There are no old infarcts. The white matter signal is normal for the patient's age. The cerebral and cerebellar volume are  age-appropriate. Susceptibility-sensitive sequences show no chronic microhemorrhage or superficial siderosis. VASCULAR: Major intracranial arterial and venous sinus flow voids are normal. SKULL AND UPPER CERVICAL SPINE: Calvarial bone marrow signal is normal. There is no skull base mass. Visualized upper cervical spine and soft tissues are normal. SINUSES/ORBITS: No fluid levels or advanced mucosal thickening. No mastoid or middle ear effusion. The orbits are normal. MRI CERVICAL SPINE FINDINGS Alignment: Normal. Vertebrae: C7 hemangioma. No compression fracture or discitis-osteomyelitis. Cord: Normal caliber and signal. Posterior Fossa, vertebral arteries, paraspinal tissues: Visualized posterior fossa is normal. Vertebral artery flow voids are preserved. No prevertebral effusion. Disc levels: Sagittal imaging includes the atlantoaxial joint to the level of the T1-2 disc space, with axial imaging of the disc spaces from C2-3 to T1-T2. C2-3: Mild left facet hypertrophy. No disc bulge. No spinal canal stenosis. Mildly narrowed left neural foramen. C3-4: No spinal canal or neural foraminal stenosis. C4-5: Normal disc.  No stenosis. C5-6: Small disc bulge. Normal facets. No spinal canal or neural foraminal stenosis. C6-7: No disc herniation or stenosis. C7-T1: Normal. T1-2: Normal.  Ax GRE images of the cervical spine were not obtained, which mildly degrades assessment of the neural foramina. However, T2-weighted imaging typically overestimates foraminal stenosis. So, given the lack of stenosis on the provided images, I do not think returning for GRE images would be particularly helpful. IMPRESSION: 1. Normal MRI of the brain. 2. Mild cervical degenerative disc disease with mild narrowing of the left C2-3 foramen. Small disc bulge at C5-6 without stenosis. Electronically Signed   By: Ulyses Jarred M.D.   On: 01/15/2018 20:58   Mr Cervical Spine Wo Contrast  Result Date: 01/15/2018 CLINICAL DATA:  Left arm weakness  EXAM: MRI HEAD WITHOUT CONTRAST MRI CERVICAL SPINE WITHOUT CONTRAST TECHNIQUE: Multiplanar, multiecho pulse sequences of the brain and surrounding structures, and cervical spine, to include the craniocervical junction and cervicothoracic junction, were obtained without intravenous contrast. COMPARISON:  Brain MRI 04/04/2015 FINDINGS: MRI HEAD FINDINGS BRAIN: There is no acute infarct, acute hemorrhage, hydrocephalus or extra-axial collection. The midline structures are normal. No midline shift or other mass effect. There are no old infarcts. The white matter signal is normal for the patient's age. The cerebral and cerebellar volume are age-appropriate. Susceptibility-sensitive sequences show no chronic microhemorrhage or superficial siderosis. VASCULAR: Major intracranial arterial and venous sinus flow voids are normal. SKULL AND UPPER CERVICAL SPINE: Calvarial bone marrow signal is normal. There is no skull base mass. Visualized upper cervical spine and soft tissues are normal. SINUSES/ORBITS: No fluid levels or advanced mucosal thickening. No mastoid or middle ear effusion. The orbits are normal. MRI CERVICAL SPINE FINDINGS Alignment: Normal. Vertebrae: C7 hemangioma. No compression fracture or discitis-osteomyelitis. Cord: Normal caliber and signal. Posterior Fossa, vertebral arteries, paraspinal tissues: Visualized posterior fossa is normal. Vertebral artery flow voids are preserved. No prevertebral effusion. Disc levels: Sagittal imaging includes the atlantoaxial joint to the level of the T1-2 disc space, with axial imaging of the disc spaces from C2-3 to T1-T2. C2-3: Mild left facet hypertrophy. No disc bulge. No spinal canal stenosis. Mildly narrowed left neural foramen. C3-4: No spinal canal or neural foraminal stenosis. C4-5: Normal disc.  No stenosis. C5-6: Small disc bulge. Normal facets. No spinal canal or neural foraminal stenosis. C6-7: No disc herniation or stenosis. C7-T1: Normal. T1-2: Normal. Ax  GRE images of the cervical spine were not obtained, which mildly degrades assessment of the neural foramina. However, T2-weighted imaging typically overestimates foraminal stenosis. So, given the lack of stenosis on the provided images, I do not think returning for GRE images would be particularly helpful. IMPRESSION: 1. Normal MRI of the brain. 2. Mild cervical degenerative disc disease with mild narrowing of the left C2-3 foramen. Small disc bulge at C5-6 without stenosis. Electronically Signed   By: Ulyses Jarred M.D.   On: 01/15/2018 20:58   Ct Coronary Morph W/cta Cor W/score W/ca W/cm &/or Wo/cm  Addendum Date: 01/16/2018   ADDENDUM REPORT: 01/16/2018 13:44 HISTORY: Chest pain, coronary calcium EXAM: Cardiac/Coronary  CT TECHNIQUE: The patient was scanned on a [employer]. PROTOCOL: A 120 kV prospective scan was triggered in the descending thoracic aorta at 111 HU's. Axial non-contrast 3 mm slices were carried out through the heart. The data set was analyzed on a dedicated work station and scored using the [employer]. Gantry rotation speed was 250 msecs and collimation was .6 mm. No beta blockade and 0.8 mg of sl NTG was given. The 3D data set was reconstructed in 5% intervals of the 67-82 % of the R-R cycle. Diastolic phases were analyzed  on a dedicated work station using MPR, MIP and VRT modes. The patient received 80 cc of contrast. FINDINGS: Coronary calcium score: The patient's coronary artery calcium score is 455, which places the patient in the 93 percentile. This is higher than expected for age and gender matched peers. Coronary arteries: Normal coronary origins.  Right dominance. Right Coronary Artery: Minimal mixed atherosclerotic plaque in the proximal, mid and distal RCA (<25% stenosis). Patent medium caliber PDA and patent small caliber PL branch. Left Main Coronary Artery: Minimal irregular plaque (<25% stenosis) Left Anterior Descending Coronary Artery: There is a moderate  heavily calcified plaque from proximal to mid LAD with 50-69% stenosis. There is a severe mixed atherosclerotic plaque in the mid LAD, 70-99% stenosis. There is a moderate calcified lesion immediately distal, and the distal LAD diffusely disease with mild atherosclerotic plaque (25-49% stenosis). Patent small caliber diagonal branches. Left Circumflex Artery: Minimal scattered plaque (<25% stenosis) in the circumflex artery. There is a mild mixed atherosclerotic plaque at the ostium of OM1 (25-49% stenosis). Patent distal vessel. Aorta: Normal size. No calcifications. No dissection. Likely mildly tortuous aorta, arch not imaged for sidedness. Aortic Valve: No calcifications. Other findings: Normal pulmonary vein drainage into the left atrium. Normal let atrial appendage without a thrombus. Normal size of the pulmonary artery. Mild mitral annular calcification. IMPRESSION: 1. The patient's coronary artery calcium score is 455, which places the patient in the 93 percentile. This is higher than expected for age and gender matched peers. 2. Normal coronary origin with right dominance. 3.  Severe CAD of the mid LAD, CADRADS = 4. Electronically Signed   By: Cherlynn Kaiser   On: 01/16/2018 13:44   Result Date: 01/16/2018 EXAM: OVER-READ INTERPRETATION  CT CHEST The following report is an over-read performed by radiologist Dr. Suzy Bouchard of [facility], [place] on 01/16/2018. This over-read does not include interpretation of cardiac or coronary anatomy or pathology. The coronary CTA interpretation by the cardiologist is attached. COMPARISON:  None. FINDINGS: Limited view of the lung parenchyma demonstrates no suspicious nodularity. Airways are normal. Limited view of the mediastinum demonstrates no adenopathy. Esophagus normal. Limited view of the upper abdomen unremarkable. Limited view of the skeleton and chest wall is unremarkable. Degenerative osteophytosis of the spine. IMPRESSION: No significant  extracardiac findings. Electronically Signed: By: Suzy Bouchard M.D. On: 01/16/2018 12:57   Dg Chest Port 1 View  Result Date: 01/15/2018 CLINICAL DATA:  Chest pain.  Cough. EXAM: PORTABLE CHEST 1 VIEW COMPARISON:  Radiographs of October 02, 2017. FINDINGS: Stable cardiomegaly. No pneumothorax or pleural effusion is noted. Both lungs are clear. The visualized skeletal structures are unremarkable. IMPRESSION: No acute cardiopulmonary abnormality seen. Electronically Signed   By: Marijo Conception, M.D.   On: 01/15/2018 13:39   Ct Coronary Fractional Flow Reserve Fluid Analysis  Result Date: 01/17/2018 EXAM: CT FFR ANALYSIS CLINICAL DATA:  Unstable angina FINDINGS: FFRct analysis was performed on the original cardiac CT angiogram dataset. Diagrammatic representation of the FFRct analysis is provided in a separate PDF document in PACS. This dictation was created using the PDF document and an interactive 3D model of the results. 3D model is not available in the EMR/PACS. Normal FFR range is >0.80. 1. Left Main:  No significant stenosis. FFR = 0.98 2. LAD: No significant stenosis. Proximal FFR = 0.96, Mid FFR = 0.77, Distal FFR = 0.68 3. LCX: No significant stenosis. Proximal FFR = 0.95, Distal FFR = 0.84, OM1 FFR = 0.84 4. RCA: No significant  stenosis. Proximal FFR = 0.97, Mid FFR = 0.94, Distal FFR = 0.90 IMPRESSION: 1. CT FFR analysis showed flow limiting stenosis in the mid and distal LAD, FFR = 0.77 after mid LAD lesion. Electronically Signed   By: Cherlynn Kaiser   On: 01/17/2018 13:08    Coronary CTA 01/16/18  IMPRESSION: 1. The patient's coronary artery calcium score is 455, which places the patient in the 93 percentile. This is higher than expected for age and gender matched peers.  2. Normal coronary origin with right dominance.  3. Severe CAD of the mid LAD, CADRADS = 4.  Echo 01/16/18 Study Conclusions  - Procedure narrative: Transthoracic echocardiography. Image quality was  poor. The study was technically difficult, as a result of body habitus. Intravenous contrast (Definity) was administered. - Left ventricle: The cavity size was normal. Wall thickness was increased in a pattern of moderate LVH. Systolic function was normal. The estimated ejection fraction was in the range of 55% to 60%. Wall motion was normal; there were no regional wall motion abnormalities. Doppler parameters are consistent with abnormal left ventricular relaxation (grade 1 diastolic dysfunction). The E/e&' ratio is >15, suggesting elevated LV filling pressure. - Mitral valve: Calcified annulus. Mildly thickened leaflets . There was trivial regurgitation. - Left atrium: The atrium was normal in size. - Inferior vena cava: The vessel was normal in size. The respirophasic diameter changes were in the normal range (>= 50%), consistent with normal central venous pressure.  Impressions:  - Technically difficult study. Definity contrast given LVEF 55-60%, moderate LVH, no definite regional wall motion abnormalities, grade 1 DD, elevated LV filling pressure, trivial MR, normal LA size, normal IVC.   LEFT HEART CATHETERIZATION 01/18/2018:  Ost RCA to Prox RCA lesion is 20% stenosed.  Ost RPDA to RPDA lesion is 20% stenosed.  Mid RCA lesion is 20% stenosed.  Prox Cx to Mid Cx lesion is 30% stenosed.  Prox LAD lesion is 60% stenosed.  Ost LAD to Prox LAD lesion is 10% stenosed.  Ost 1st Diag lesion is 30% stenosed.  Mid LAD lesion is 50% stenosed.  Ost 2nd Diag lesion is 50% stenosed.  1. Moderate, eccentric, calcified stenosis in the mid LAD at the takeoff of the first diagonal branch and moderate eccentric stenosis in the mid to distal LAD at the takeoff of the second diagonal branch. DFR and FFR analysis of the mid and distal LAD suggests the lesions are not flow limiting. In multiple angiographic views, the lesions appear to be moderate only.   2. Mild non-obstructive disease in the RCA and Circumflex  Recommendation: I would recommend a trial of medical therapy at this time given the moderate disease in her LAD. I will add a beta blocker and Imdur. If she continues to have angina despite optimal medical therapy, consideration could be given to PCI of the mid LAD. Resume DOAC tomorrow.   Subjective: Feels well, no chest pain or dyspnea. Ambulating and eating without problems. No bleeding from cath site.   Discharge Exam: Vitals:   01/19/18 0536 01/19/18 0847  BP: 109/71 140/72  Pulse: 67   Resp:    Temp: 98.4 F (36.9 C)   SpO2: 97%    General: Diane Barker is alert, awake, not in acute distress Cardiovascular: RRR, S1/S2 +, no rubs, no gallops Respiratory: CTA bilaterally, no wheezing, no rhonchi Abdominal: Soft, NT, ND, bowel sounds + Extremities: right radial site c/d/i, no edema, no cyanosis  Labs: BNP (last 3 results) No results for input(s):  BNP in the last 8760 hours. Basic Metabolic Panel: Recent Labs  Lab 01/15/18 1211 01/16/18 0238 01/19/18 0420  NA 139 140 137  K 3.6 3.9 4.2  CL 113* 115* 113*  CO2 24 22 19*  GLUCOSE 98 95 94  BUN 14 11 13   CREATININE 0.95 0.94 0.97  CALCIUM 8.3* 8.0* 8.3*  MG  --  2.0  --   PHOS  --  2.9  --    Liver Function Tests: Recent Labs  Lab 01/16/18 0238  AST 13*  ALT 10  ALKPHOS 49  BILITOT 0.6  PROT 5.9*  ALBUMIN 3.0*   No results for input(s): LIPASE, AMYLASE in the last 168 hours. No results for input(s): AMMONIA in the last 168 hours. CBC: Recent Labs  Lab 01/15/18 1211 01/16/18 0238 01/19/18 0420  WBC 4.6 5.0 7.2  HGB 12.9 11.9* 12.7  HCT 39.8 36.5 38.7  MCV 100.5* 98.9 99.2  PLT 206 196 210   Cardiac Enzymes: Recent Labs  Lab 01/15/18 1521 01/15/18 2049 01/16/18 0238  TROPONINI <0.03 <0.03 <0.03    Time coordinating discharge: Approximately 40 minutes  Patrecia Pour, MD  [facility] 01/19/2018, 11:11 AM Pager [phone]

## 2018-01-19 NOTE — Consult Note (Signed)
            [facility] CM Primary Care Navigator  01/19/2018  Diane Barker [birth year] [id number]   Went to seepatient in the room to identify possible discharge needs [email or web address] staff report.  Per MD note,patient presented with chest pain and dyspnea on exertion relieved with nitroglycerin. Cardiology was consulted for unstable angina, underwent cardiac catheterization.   Patient has discharge instruction to follow-up withprimary care provider and cardiology follow-up in 1 week.  Primary care provider's office is listed as providing transition of care (TOC) follow-up.   For additional questions please contact:  Edwena Felty A. Maciah Feeback, BSN, RN-BC [facility] PRIMARY CARE Navigator Cell: [phone]

## 2018-01-19 NOTE — Progress Notes (Signed)
Progress Note  Patient Name: Diane Barker Date of Encounter: 01/19/2018  Primary Cardiologist: Nadean Corwin  Subjective   Ambulating in halls with no issues   Inpatient Medications    Scheduled Meds: . aspirin EC  81 mg Oral Daily  . atorvastatin  40 mg Oral q1800  . escitalopram  10 mg Oral Daily  . guaiFENesin  600 mg Oral BID  . isosorbide mononitrate  30 mg Oral Daily  . metoprolol succinate  25 mg Oral Daily  . pantoprazole  40 mg Oral Daily  . rivaroxaban  20 mg Oral q1800  . sodium chloride flush  3 mL Intravenous Q12H   Continuous Infusions: . sodium chloride     PRN Meds: sodium chloride, acetaminophen **OR** acetaminophen, bisacodyl, HYDROcodone-acetaminophen, levalbuterol, ondansetron **OR** ondansetron (ZOFRAN) IV, polyethylene glycol, sodium chloride flush   Vital Signs    Vitals:   01/18/18 2114 01/19/18 0536 01/19/18 0543 01/19/18 0847  BP: 114/61 109/71  140/72  Pulse: 76 67    Resp:      Temp: 99 F (37.2 C) 98.4 F (36.9 C)    TempSrc: Oral Oral    SpO2:  97%    Weight:   117.7 kg   Height:        Intake/Output Summary (Last 24 hours) at 01/19/2018 0904 Last data filed at 01/18/2018 2125 Gross per 24 hour  Intake -  Output 600 ml  Net -600 ml   Filed Weights   01/17/18 0601 01/18/18 0547 01/19/18 0543  Weight: 117.5 kg 117.3 kg 117.7 kg    Telemetry    Sinus rhythm - Personally Reviewed  ECG    No new- Personally Reviewed  Physical Exam   GEN: No acute distress.   Neck: No JVD Cardiac: RRR, no murmurs, rubs, or gallops.  Respiratory: Clear to auscultation bilaterally. GI: Soft, nontender, non-distended  MS: No edema; No deformity. Neuro:  Nonfocal  Psych: Normal affect   Labs    Chemistry Recent Labs  Lab 01/15/18 1211 01/16/18 0238 01/19/18 0420  NA 139 140 137  K 3.6 3.9 4.2  CL 113* 115* 113*  CO2 24 22 19*  GLUCOSE 98 95 94  BUN 14 11 13   CREATININE 0.95 0.94 0.97  CALCIUM 8.3* 8.0* 8.3*  PROT  --  5.9*  --    ALBUMIN  --  3.0*  --   AST  --  13*  --   ALT  --  10  --   ALKPHOS  --  49  --   BILITOT  --  0.6  --   GFRNONAA >60 >60 59*  GFRAA >60 >60 >60  ANIONGAP 2* 3* 5     Hematology Recent Labs  Lab 01/15/18 1211 01/16/18 0238 01/19/18 0420  WBC 4.6 5.0 7.2  RBC 3.96 3.69* 3.90  HGB 12.9 11.9* 12.7  HCT 39.8 36.5 38.7  MCV 100.5* 98.9 99.2  MCH 32.6 32.2 32.6  MCHC 32.4 32.6 32.8  RDW 12.4 12.4 12.2  PLT 206 196 210    Cardiac Enzymes Recent Labs  Lab 01/15/18 1521 01/15/18 2049 01/16/18 0238  TROPONINI <0.03 <0.03 <0.03    Recent Labs  Lab 01/15/18 1201  TROPIPOC 0.01     BNPNo results for input(s): BNP, PROBNP in the last 168 hours.   DDimer  Recent Labs  Lab 01/15/18 1237  DDIMER <0.27     Radiology    No results found.  Cardiac Studies  Coronary CTA 01/16/18  IMPRESSION:  1. The patient's coronary artery calcium score is 455, which places the patient in the 93 percentile. This is higher than expected for age and gender matched peers.  2. Normal coronary origin with right dominance.  3.  Severe CAD of the mid LAD, CADRADS = 4.  Echo 01/16/18 Study Conclusions  - Procedure narrative: Transthoracic echocardiography. Image   quality was poor. The study was technically difficult, as a   result of body habitus. Intravenous contrast (Definity) was   administered. - Left ventricle: The cavity size was normal. Wall thickness was   increased in a pattern of moderate LVH. Systolic function was   normal. The estimated ejection fraction was in the range of 55%   to 60%. Wall motion was normal; there were no regional wall   motion abnormalities. Doppler parameters are consistent with   abnormal left ventricular relaxation (grade 1 diastolic   dysfunction). The E/e&' ratio is >15, suggesting elevated LV   filling pressure. - Mitral valve: Calcified annulus. Mildly thickened leaflets .   There was trivial regurgitation. - Left atrium: The  atrium was normal in size. - Inferior vena cava: The vessel was normal in size. The   respirophasic diameter changes were in the normal range (>= 50%),   consistent with normal central venous pressure.  Impressions:  - Technically difficult study. Definity contrast given LVEF 55-60%,   moderate LVH, no definite regional wall motion abnormalities,   grade 1 DD, elevated LV filling pressure, trivial MR, normal LA   size, normal IVC.   Patient Profile  Diane Barker is a 67 y.o. female with a PMH of HTN, GERD, PE on xarelto, depression, and breast CA s/p lumpectomy, who is being seen for the evaluation of chest pain at the request of Dr. Roel Cluck.  Assessment & Plan    Principal Problem:   Unstable angina (HCC) Active Problems:   Obstructive chronic bronchitis with acute bronchitis (HCC)   CHEST PAIN   BREAST CANCER, HX OF   Pulmonary embolism (HCC)   Dyspnea on exertion   Chest pain  UA/DOE -  Post cath 01/18/18 with moderate LAD 60% negative invasive FFR CT medical Rx beta blocker ASA And imdur.    HTN:  D/c amlodipine start low dose beta blocker    Hx of PE -  Resume xarelto right radial artery cath sight ok  Ok for d/c home will arrange outpatient f/u with DR Nadean Corwin      For questions or updates, please contact [place] Please consult [email or web address] for contact info under      Signed, Jenkins Rouge, MD  01/19/2018, 9:04 AM

## 2018-01-19 NOTE — Care Management Note (Signed)
Case Management Note  Patient Details  Name: Diane Barker MRN: [id number] Date of Birth: Mar 09, 1951  Subjective/Objective:  Pt presented for unstable angina. PTA from home with family support. PT recommendations for Outpatient PT- patient wants to do cardiac rehab at this time. Patient wants to see if the MD can write Rx for outpatient PT evaluation and treatment.                 Action/Plan: CM did reach out to MD no Rx will be given at this time for Outpatient PT. MD suggestions for Patient to follow up with Cardiologist and PCP for further treatment if needed. No further needs from CM at this time.    Expected Discharge Date:  01/19/18               Expected Discharge Plan:  Home/Self Care  In-House Referral:  NA  Discharge planning Services  CM Consult  Post Acute Care Choice:  NA Choice offered to:  NA  DME Arranged:  N/A DME Agency:  NA  HH Arranged:  NA HH Agency:  NA  Status of Service:  Completed, signed off  If discussed at [place] of Stay Meetings, dates discussed:    Additional Comments:  Bethena Roys, RN 01/19/2018, 12:03 PM

## 2018-01-19 NOTE — Progress Notes (Signed)
Occupational Therapy Treatment Patient Details Name: Diane Barker MRN: [id number] DOB: [birth year] Today's Date: 01/19/2018    History of present illness Diane Barker is a 67 y.o. female with medical history significant of CAD,   chronic bronchitis/COPD, GERD, hx breast cancer, Hx DVT/ PE on Xarelto. CHF with grade 1 diastolic CHF presenting with CP   OT comments  Pt at adequate level for d/c from OT standpoint and ec education complete at this time. All education is complete and patient indicates understanding.   Follow Up Recommendations  No OT follow up;Supervision/Assistance - 24 hour(Pending surgery)    Equipment Recommendations  None recommended by OT    Recommendations for Other Services      Precautions / Restrictions Precautions Precautions: None Restrictions Weight Bearing Restrictions: No       Mobility Bed Mobility               General bed mobility comments: at sink on arrival  Transfers Overall transfer level: Independent                    Balance Overall balance assessment: Mild deficits observed, not formally tested                                         ADL either performed or assessed with clinical judgement   ADL Overall ADL's : Modified independent                                       General ADL Comments: Pt performing ADLs mod i on arrival. pt provided [facility] handout and reviewed in detail for adls. pt plans to use a shower seat, electric carts at grocery store, pt has hand held shower head and currently uses elevated commode. pt reports "thank you for helping me with this .. this is good"      Vision Baseline Vision/History: Wears glasses Wears Glasses: At all times Patient Visual Report: No change from baseline     Perception     Praxis      Cognition Arousal/Alertness: Awake/alert Behavior During Therapy: WFL for tasks assessed/performed Overall Cognitive Status: Within Functional Limits  for tasks assessed                                          Exercises     Shoulder Instructions       General Comments      Pertinent Vitals/ Pain       Pain Assessment: No/denies pain  Home Living Family/patient expects to be discharged to:: Private residence Living Arrangements: Alone Available Help at Discharge: Family Type of Home: House Home Access: Stairs to enter Technical brewer of Steps: 1   Home Layout: One level     Bathroom Shower/Tub: Teacher, early years/pre: Handicapped height     Home Equipment: Civil engineer, contracting   Additional Comments: Daughter plans on staying at [employer]      Prior Functioning/Environment Level of Independence: Independent            Frequency  Min 2X/week        Progress Toward Goals  OT Goals(current goals can now be found in the care  plan section)     Acute Rehab OT Goals Patient Stated Goal: to go home today OT Goal Formulation: With patient Time For Goal Achievement: 01/31/18 Potential to Achieve Goals: Good  Plan      Co-evaluation                 AM-PAC PT "6 Clicks" Daily Activity     Outcome Measure   Help from another person eating meals?: None Help from another person taking care of personal grooming?: None Help from another person toileting, which includes using toliet, bedpan, or urinal?: None Help from another person bathing (including washing, rinsing, drying)?: None Help from another person to put on and taking off regular upper body clothing?: None Help from another person to put on and taking off regular lower body clothing?: None 6 Click Score: 24    End of Session    OT Visit Diagnosis: Unsteadiness on feet (R26.81)   Activity Tolerance Patient tolerated treatment well   Patient Left with call bell/phone within reach;in bed   Nurse Communication Mobility status        Time: 1130-1145 OT Time Calculation (min): 15 min  Charges: OT General  Charges $OT Visit: 1 Visit OT Treatments $Self Care/Home Management : 8-22 mins   Jeri Modena, OTR/L  Acute Rehabilitation Services Pager: [phone] Office: [phone] .    Parke Poisson B 01/19/2018, 2:51 PM

## 2018-01-19 NOTE — Progress Notes (Signed)
CARDIAC REHAB PHASE I   PRE:  Rate/Rhythm: 78 SR  BP:  Supine:   Sitting: 140/72  Standing:    SaO2: 97%RA  MODE:  Ambulation: 430 ft   POST:  Rate/Rhythm: 93 SR  BP:  Supine:   Sitting: 125/81  Standing:    SaO2: 97%RA 0854-0940 Pt walked 430 ft on RA with steady gait and no CP. Slightly SOB but sats good on RA. Discussed NTG use, risk factors, gave heart healthy diet and ex ed. Pt would like to attend CRP 2. Gave her brochure to discuss with cardiologist as she would need stable angina referral for [place].     Graylon Good, RN BSN  01/19/2018 9:35 AM

## 2018-02-01 ENCOUNTER — Encounter: Payer: 59 | Admitting: Internal Medicine

## 2018-02-05 ENCOUNTER — Encounter: Payer: Self-pay | Admitting: Internal Medicine

## 2018-02-05 ENCOUNTER — Ambulatory Visit (INDEPENDENT_AMBULATORY_CARE_PROVIDER_SITE_OTHER): Payer: 59 | Admitting: Internal Medicine

## 2018-02-05 VITALS — BP 120/64 | HR 58 | Ht 65.0 in | Wt 264.4 lb

## 2018-02-05 DIAGNOSIS — I208 Other forms of angina pectoris: Secondary | ICD-10-CM | POA: Diagnosis not present

## 2018-02-05 DIAGNOSIS — Z86718 Personal history of other venous thrombosis and embolism: Secondary | ICD-10-CM

## 2018-02-05 DIAGNOSIS — K219 Gastro-esophageal reflux disease without esophagitis: Secondary | ICD-10-CM

## 2018-02-05 DIAGNOSIS — Z86711 Personal history of pulmonary embolism: Secondary | ICD-10-CM

## 2018-02-05 MED ORDER — ATORVASTATIN CALCIUM 40 MG PO TABS: 40.0000 mg | ORAL_TABLET | Freq: Every day | ORAL | 3 refills | Status: DC

## 2018-02-05 MED ORDER — NITROGLYCERIN 0.4 MG SL SUBL: 0.4000 mg | SUBLINGUAL_TABLET | SUBLINGUAL | 3 refills | Status: DC | PRN

## 2018-02-05 MED ORDER — METOPROLOL SUCCINATE ER 25 MG PO TB24: 25.0000 mg | ORAL_TABLET | Freq: Every day | ORAL | 3 refills | Status: DC

## 2018-02-05 MED ORDER — ISOSORBIDE MONONITRATE ER 30 MG PO TB24: 30.0000 mg | ORAL_TABLET | Freq: Every day | ORAL | 3 refills | Status: DC

## 2018-02-05 NOTE — Patient Instructions (Addendum)
Medication Instructions:  Your physician has recommended you make the following change in your medication:   An Rx for sublingual Nitro-glycerin use as directed.  Your cardiac medications have been refilled today.  If you need a refill on your cardiac medications before your next appointment, please call your pharmacy.   Lab work: None ordered If you have labs (blood work) drawn today and your tests are completely normal, you will receive your results only by: Marland Kitchen MyChart Message (if you have MyChart) OR . A paper copy in the mail If you have any lab test that is abnormal or we need to change your treatment, we will call you to review the results.  Testing/Procedures: None ordered  Follow-Up: At [facility], you and your health needs are our priority.  As part of our continuing mission to provide you with exceptional heart care, we have created designated Provider Care Teams.  These Care Teams include your primary Cardiologist (physician) and Advanced Practice Providers (APPs -  Physician Assistants and Nurse Practitioners) who all work together to provide you with the care you need, when you need it. Your physician recommends that you schedule a follow-up appointment in: 3 months    Any Other Special Instructions Will Be Listed Below (If Applicable). You have been referred to [facility]. They will contact you directly to initiate.   Nitroglycerin sublingual tablets What is this medicine? NITROGLYCERIN (nye troe GLI ser in) is a type of vasodilator. It relaxes blood vessels, increasing the blood and oxygen supply to your heart. This medicine is used to relieve chest pain caused by angina. It is also used to prevent chest pain before activities like climbing stairs, going outdoors in cold weather, or sexual activity. This medicine may be used for other purposes; ask your health care provider or pharmacist if you have questions. COMMON BRAND NAME(S): Nitroquick,  Nitrostat, Nitrotab What should I tell my health care provider before I take this medicine? They need to know if you have any of these conditions: -anemia -head injury, recent stroke, or bleeding in the brain -liver disease -previous heart attack -an unusual or allergic reaction to nitroglycerin, other medicines, foods, dyes, or preservatives -pregnant or trying to get pregnant -breast-feeding How should I use this medicine? Take this medicine by mouth as needed. At the first sign of an angina attack (chest pain or tightness) place one tablet under your tongue. You can also take this medicine 5 to 10 minutes before an event likely to produce chest pain. Follow the directions on the prescription label. Let the tablet dissolve under the tongue. Do not swallow whole. Replace the dose if you accidentally swallow it. It will help if your mouth is not dry. Saliva around the tablet will help it to dissolve more quickly. Do not eat or drink, smoke or chew tobacco while a tablet is dissolving. If you are not better within 5 minutes after taking ONE dose of nitroglycerin, call 9-1-1 immediately to seek emergency medical care. Do not take more than 3 nitroglycerin tablets over 15 minutes. If you take this medicine often to relieve symptoms of angina, your doctor or health care professional may provide you with different instructions to manage your symptoms. If symptoms do not go away after following these instructions, it is important to call 9-1-1 immediately. Do not take more than 3 nitroglycerin tablets over 15 minutes. Talk to your pediatrician regarding the use of this medicine in children. Special care may be needed. Overdosage: If you  think you have taken too much of this medicine contact a poison control center or emergency room at once. NOTE: This medicine is only for you. Do not share this medicine with others. What if I miss a dose? This does not apply. This medicine is only used as needed. What  may interact with this medicine? Do not take this medicine with any of the following medications: -certain migraine medicines like ergotamine and dihydroergotamine (DHE) -medicines used to treat erectile dysfunction like sildenafil, tadalafil, and vardenafil -riociguat This medicine may also interact with the following medications: -alteplase -aspirin -heparin -medicines for high blood pressure -medicines for mental depression -other medicines used to treat angina -phenothiazines like chlorpromazine, mesoridazine, prochlorperazine, thioridazine This list may not describe all possible interactions. Give your health care provider a list of all the medicines, herbs, non-prescription drugs, or dietary supplements you use. Also tell them if you smoke, drink alcohol, or use illegal drugs. Some items may interact with your medicine. What should I watch for while using this medicine? Tell your doctor or health care professional if you feel your medicine is no longer working. Keep this medicine with you at all times. Sit or lie down when you take your medicine to prevent falling if you feel dizzy or faint after using it. Try to remain calm. This will help you to feel better faster. If you feel dizzy, take several deep breaths and lie down with your feet propped up, or bend forward with your head resting between your knees. You may get drowsy or dizzy. Do not drive, use machinery, or do anything that needs mental alertness until you know how this drug affects you. Do not stand or sit up quickly, especially if you are an older patient. This reduces the risk of dizzy or fainting spells. Alcohol can make you more drowsy and dizzy. Avoid alcoholic drinks. Do not treat yourself for coughs, colds, or pain while you are taking this medicine without asking your doctor or health care professional for advice. Some ingredients may increase your blood pressure. What side effects may I notice from receiving this  medicine? Side effects that you should report to your doctor or health care professional as soon as possible: -blurred vision -dry mouth -skin rash -sweating -the feeling of extreme pressure in the head -unusually weak or tired Side effects that usually do not require medical attention (report to your doctor or health care professional if they continue or are bothersome): -flushing of the face or neck -headache -irregular heartbeat, palpitations -nausea, vomiting This list may not describe all possible side effects. Call your doctor for medical advice about side effects. You may report side effects to FDA at 1-800-FDA-1088. Where should I keep my medicine? Keep out of the reach of children. Store at room temperature between 20 and 25 degrees C (68 and 77 degrees F). Store in Chief of Staff. Protect from light and moisture. Keep tightly closed. Throw away any unused medicine after the expiration date. NOTE: This sheet is a summary. It may not cover all possible information. If you have questions about this medicine, talk to your doctor, pharmacist, or health care provider.  2018 Elsevier/Gold Standard (2012-12-30 17:57:36)    Mediterranean Diet A Mediterranean diet refers to food and lifestyle choices that are based on the traditions of countries located on the [employer]. This way of eating has been shown to help prevent certain conditions and improve outcomes for people who have chronic diseases, like kidney disease and heart disease. What are  tips for following this plan? Lifestyle  Cook and eat meals together with your family, when possible.  Drink enough fluid to keep your urine clear or pale yellow.  Be physically active every day. This includes: ? Aerobic exercise like running or swimming. ? Leisure activities like gardening, walking, or housework.  Get 7-8 hours of sleep each night.  If recommended by your health care provider, drink red wine in moderation.  This means 1 glass a day for nonpregnant women and 2 glasses a day for men. A glass of wine equals 5 oz (150 mL). Reading food labels  Check the serving size of packaged foods. For foods such as rice and pasta, the serving size refers to the amount of cooked product, not dry.  Check the total fat in packaged foods. Avoid foods that have saturated fat or trans fats.  Check the ingredients list for added sugars, such as corn syrup. Shopping  At the grocery store, buy most of your food from the areas near the walls of the store. This includes: ? Fresh fruits and vegetables (produce). ? Grains, beans, nuts, and seeds. Some of these may be available in unpackaged forms or large amounts (in bulk). ? Fresh seafood. ? Poultry and eggs. ? Low-fat dairy products.  Buy whole ingredients instead of prepackaged foods.  Buy fresh fruits and vegetables in-season from local farmers markets.  Buy frozen fruits and vegetables in resealable bags.  If you do not have access to quality fresh seafood, buy precooked frozen shrimp or canned fish, such as tuna, salmon, or sardines.  Buy small amounts of raw or cooked vegetables, salads, or olives from the deli or salad bar at your store.  Stock your pantry so you always have certain foods on hand, such as olive oil, canned tuna, canned tomatoes, rice, pasta, and beans. Cooking  Cook foods with extra-virgin olive oil instead of using butter or other vegetable oils.  Have meat as a side dish, and have vegetables or grains as your main dish. This means having meat in small portions or adding small amounts of meat to foods like pasta or stew.  Use beans or vegetables instead of meat in common dishes like chili or lasagna.  Experiment with different cooking methods. Try roasting or broiling vegetables instead of steaming or sauteing them.  Add frozen vegetables to soups, stews, pasta, or rice.  Add nuts or seeds for added healthy fat at each meal. You can  add these to yogurt, salads, or vegetable dishes.  Marinate fish or vegetables using olive oil, lemon juice, garlic, and fresh herbs. Meal planning  Plan to eat 1 vegetarian meal one day each week. Try to work up to 2 vegetarian meals, if possible.  Eat seafood 2 or more times a week.  Have healthy snacks readily available, such as: ? Vegetable sticks with hummus. ? [place] yogurt. ? Fruit and nut trail mix.  Eat balanced meals throughout the week. This includes: ? Fruit: 2-3 servings a day ? Vegetables: 4-5 servings a day ? Low-fat dairy: 2 servings a day ? Fish, poultry, or lean meat: 1 serving a day ? Beans and legumes: 2 or more servings a week ? Nuts and seeds: 1-2 servings a day ? Whole grains: 6-8 servings a day ? Extra-virgin olive oil: 3-4 servings a day  Limit red meat and sweets to only a few servings a month What are my food choices?  Mediterranean diet ? Recommended ? Grains: Whole-grain pasta. Brown rice. Bulgar wheat.  Polenta. Couscous. Whole-wheat bread. [place]. ? Vegetables: Artichokes. Beets. Broccoli. Cabbage. Carrots. Eggplant. Green beans. Chard. Kale. Spinach. Onions. Leeks. Peas. Squash. Tomatoes. Peppers. Radishes. ? Fruits: Apples. Apricots. Avocado. Berries. Bananas. Cherries. Dates. Figs. Grapes. Lemons. Melon. Oranges. Peaches. Plums. Pomegranate. ? Meats and other protein foods: Beans. Almonds. Sunflower seeds. Pine nuts. Peanuts. [place]. Salmon. Scallops. Shrimp. [place]. Tilapia. Clams. Oysters. Eggs. ? Dairy: Low-fat milk. Cheese. Greek yogurt. ? Beverages: Water. Red wine. Herbal tea. ? Fats and oils: Extra virgin olive oil. Avocado oil. Grape seed oil. ? Sweets and desserts: [place] yogurt with honey. Baked apples. Poached pears. Trail mix. ? Seasoning and other foods: Basil. Cilantro. Coriander. Cumin. Mint. Parsley. Sage. Rosemary. Tarragon. Garlic. Oregano. Thyme. Pepper. Balsalmic vinegar. Tahini. Hummus. Tomato sauce. Olives.  Mushrooms. ? Limit these ? Grains: Prepackaged pasta or rice dishes. Prepackaged cereal with added sugar. ? Vegetables: Deep fried potatoes (french fries). ? Fruits: Fruit canned in syrup. ? Meats and other protein foods: Beef. Pork. Lamb. Poultry with skin. Hot dogs. Berniece Salines. ? Dairy: Ice cream. Sour cream. Whole milk. ? Beverages: Juice. Sugar-sweetened soft drinks. Beer. Liquor and spirits. ? Fats and oils: Butter. Canola oil. Vegetable oil. Beef fat (tallow). Lard. ? Sweets and desserts: Cookies. Cakes. Pies. Candy. ? Seasoning and other foods: Mayonnaise. Premade sauces and marinades. ? The items listed may not be a complete list. Talk with your dietitian about what dietary choices are right for you. Summary  The Mediterranean diet includes both food and lifestyle choices.  Eat a variety of fresh fruits and vegetables, beans, nuts, seeds, and whole grains.  Limit the amount of red meat and sweets that you eat.  Talk with your health care provider about whether it is safe for you to drink red wine in moderation. This means 1 glass a day for nonpregnant women and 2 glasses a day for men. A glass of wine equals 5 oz (150 mL). This information is not intended to replace advice given to you by your health care provider. Make sure you discuss any questions you have with your health care provider. Document Released: 10/25/2015 Document Revised: 11/27/2015 Document Reviewed: 10/25/2015 Elsevier Interactive Patient Education  [employer].

## 2018-02-05 NOTE — Progress Notes (Signed)
nitro

## 2018-02-05 NOTE — Progress Notes (Signed)
Cardiology Office Note:    Date:  02/21/2018   ID:  Diane Barker, DOB February 23, 1951, MRN [id number]  PCP:  Shon Baton, MD  Cardiologist:  No primary care provider on file.  Electrophysiologist:  None   Referring MD: Shon Baton, MD   Hospital follow-up for coronary artery disease and chest pain  History of Present Illness:    Diane Barker is a 67 y.o. female with a hx of PE on xarelto (in 2015, presumed unprovoked), HTN, GERD, depression, and breast CA s/p lumpectomy who was admitted to the hospital on November 1 for chest pain that was left-sided and dyspnea on exertion.  Troponins were negative, however given concerns of unstable angina coronary CTA was performed documenting severe mid LAD disease.  Coronary angiogram was performed documenting moderate to severe mid LAD disease with a lesion that was not hemodynamically significant, therefore medical management was recommended.  Metoprolol succinate and Imdur were added to her medication regimen, and she continues on Xarelto for previous PE.  She feels well and has no acute concerns.  She has had some continued discomfort and dyspnea on exertion, and we discussed exercise and diet recommendations.  She denies palpitations, PND, orthopnea, or significant leg swelling.  Denies dizziness or lightheadedness, denies syncope or presyncope.  Past Medical History:  Diagnosis Date  . Anxiety   . Arthritis    "all over my body" (01/15/2018)  . Breast cancer, right breast ([place]) 2000   Rt lumpectomy, Chemo/XRT/rt axillary node   . Bronchial asthma   . Chronic lower back pain   . Depression   . Diverticulosis   . DVT (deep venous thrombosis) ([place]) 06/2013   RLE  . GERD (gastroesophageal reflux disease)   . High cholesterol   . Hx of adenomatous colonic polyps   . Hypertension   . Obesity   . Osteoarthritis   . Personal history of chemotherapy   . Personal history of radiation therapy   . Pulmonary embolism ([place]) 06/2013   "both lungs"  .  Spondylolisthesis     Past Surgical History:  Procedure Laterality Date  . BREAST BIOPSY Right 2000  . BREAST LUMPECTOMY Right 2000   for CA txed w/ chemo and radiation, right  . CARDIAC CATHETERIZATION    . HERNIA REPAIR    . INTRAVASCULAR PRESSURE WIRE/FFR STUDY N/A 01/18/2018   Procedure: INTRAVASCULAR PRESSURE WIRE/FFR STUDY;  Surgeon: Burnell Blanks, MD;  Location: [facility];  Service: Cardiovascular;  Laterality: N/A;  . LEFT HEART CATH AND CORONARY ANGIOGRAPHY N/A 01/18/2018   Procedure: LEFT HEART CATH AND CORONARY ANGIOGRAPHY;  Surgeon: Burnell Blanks, MD;  Location: [facility];  Service: Cardiovascular;  Laterality: N/A;  . TUBAL LIGATION    . UMBILICAL HERNIA REPAIR  1983    Current Medications: Current Meds  Medication Sig  . acetaminophen (TYLENOL) 500 MG tablet Take 500 mg by mouth every 6 (six) hours as needed for mild pain.  Marland Kitchen aspirin EC 81 MG EC tablet Take 1 tablet (81 mg total) by mouth daily.  Marland Kitchen atorvastatin (LIPITOR) 40 MG tablet Take 1 tablet (40 mg total) by mouth daily at 6 PM.  . Cholecalciferol (VITAMIN D) 2000 UNITS CAPS Take 4,000 Units by mouth daily.   . ciclopirox (LOPROX) 0.77 % cream Apply 1 application topically 2 (two) times daily.  Marland Kitchen dexlansoprazole (DEXILANT) 60 MG capsule Take 60 mg by mouth daily.  Marland Kitchen docusate sodium (COLACE) 100 MG capsule Take 100 mg by  mouth 2 (two) times daily.  Marland Kitchen escitalopram (LEXAPRO) 10 MG tablet Take 10 mg by mouth.  . isosorbide mononitrate (IMDUR) 30 MG 24 hr tablet Take 1 tablet (30 mg total) by mouth daily.  . metoprolol succinate (TOPROL-XL) 25 MG 24 hr tablet Take 1 tablet (25 mg total) by mouth daily.  Marland Kitchen PROAIR HFA 108 (90 Base) MCG/ACT inhaler Inhale 2 puffs into the lungs every 4 (four) hours as needed for wheezing or shortness of breath.   . vitamin B-12 (CYANOCOBALAMIN) 500 MCG tablet Take 500 mcg by mouth daily.  Alveda Reasons 20 MG TABS tablet Take 1 tablet by mouth daily.  .  [DISCONTINUED] atorvastatin (LIPITOR) 40 MG tablet Take 1 tablet (40 mg total) by mouth daily at 6 PM.  . [DISCONTINUED] isosorbide mononitrate (IMDUR) 30 MG 24 hr tablet Take 1 tablet (30 mg total) by mouth daily.  . [DISCONTINUED] metoprolol succinate (TOPROL-XL) 25 MG 24 hr tablet Take 1 tablet (25 mg total) by mouth daily.     Allergies:   Erythromycin; Gabapentin; Morphine; Penicillins; and Relafen [nabumetone]   Social History   Socioeconomic History  . Marital status: Single    Spouse name: Not on file  . Number of children: 2  . Years of education: 10  . Highest education level: Not on file  Occupational History  . 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 Financial resource strain: Not on file  . Food insecurity:    Worry: Not on file    Inability: Not on file  . Transportation needs:    Medical: Not on file    Non-medical: Not on file  Tobacco Use  . Smoking status: Never Smoker  . Smokeless tobacco: Never Used  Substance and Sexual Activity  . Alcohol use: Never    Alcohol/week: 0.0 standard drinks    Frequency: Never  . Drug use: Never  . Sexual activity: Not Currently  Lifestyle  . Physical activity:    Days per week: Not on file    Minutes per session: Not on file  . Stress: Not on file  Relationships  . Social connections:    Talks on phone: Not on file    Gets together: Not on file    Attends religious service: Not on file    Active member of club or organization: Not on file    Attends meetings of clubs or organizations: Not on file    Relationship status: Not on file  Other Topics Concern  . Not on file  Social History Narrative   Lives at home alone   Caffeine use- soda 16-24 oz weekly     Family History: The patient's family history includes Asthma in her daughter and mother; Breast cancer in her maternal aunt; COPD in her mother; Colon cancer in her paternal aunt; Diabetes in her  brother, daughter, and mother; Heart attack in her brother and mother; Hypertension in her brother; Kidney failure in her brother; Lung cancer in her father.  ROS:   Please see the history of present illness.    All other systems reviewed and are negative.  EKGs/Labs/Other Studies Reviewed:    The following studies were reviewed today:  EKG:  EKG is not ordered today.   Recent Labs: 01/15/2018: TSH 2.974 01/16/2018: ALT 10; Magnesium 2.0 01/19/2018: BUN 13; Creatinine, Ser 0.97; Hemoglobin 12.7; Platelets 210; Potassium 4.2; Sodium 137  Recent Lipid Panel    Component  Value Date/Time   CHOL 182 01/16/2018 0238   TRIG 60 01/16/2018 0238   HDL 46 01/16/2018 0238   CHOLHDL 4.0 01/16/2018 0238   VLDL 12 01/16/2018 0238   LDLCALC 124 (H) 01/16/2018 0238    Physical Exam:    VS:  BP 120/64   Pulse (!) 58   Ht 5\' 5"  (1.651 m)   Wt 264 lb 6.4 oz (119.9 kg)   SpO2 97%   BMI 44.00 kg/m     Wt Readings from Last 3 Encounters:  02/05/18 264 lb 6.4 oz (119.9 kg)  01/19/18 259 lb 6.4 oz (117.7 kg)  01/08/18 262 lb (118.8 kg)     Constitutional: No acute distress Eyes: pupils equally round and reactive to light, sclera non-icteric, normal conjunctiva and lids ENMT: normal dentition, moist mucous membranes Cardiovascular: regular rhythm, normal rate, no murmurs. S1 and S2 normal. Radial pulses normal bilaterally. No jugular venous distention.  Respiratory: clear to auscultation bilaterally GI : normal bowel sounds, soft and nontender. No distention.   MSK: extremities warm, well perfused. No edema.  NEURO: grossly nonfocal exam, moves all extremities. PSYCH: alert and oriented x 3, normal mood and affect.   ASSESSMENT:    1. Stable angina ([place])   2. History of pulmonary embolus (PE)   3. History of DVT (deep vein thrombosis)   4. Gastroesophageal reflux disease, esophagitis presence not specified    PLAN:    We discussed diet and lifestyle recommendations.  I have also  instructed her to use sublingual nitroglycerin if she gets discomfort in her chest.  I have emphasized a moderate aerobic exercise regimen.  I have asked her to monitor her symptoms over time given that we could consider PCI of the mid LAD if symptoms continue despite best medical therapy.  I will see her back in 3 months.  TIME SPENT WITH PATIENT: 30 minutes of patient care. More than 50% of that time was spent on direct face to face patient care, including coordination of care and counseling regarding CAD and lifestyle modification.  Cherlynn Kaiser, MD [place]  [facility]    Medication Adjustments/Labs and Tests Ordered: Current medicines are reviewed at length with the patient today.  Concerns regarding medicines are outlined above.  Orders Placed This Encounter  Procedures  . AMB referral to cardiac rehabilitation   Meds ordered this encounter  Medications  . nitroGLYCERIN (NITROSTAT) 0.4 MG SL tablet    Sig: Place 1 tablet (0.4 mg total) under the tongue every 5 (five) minutes as needed.    Dispense:  25 tablet    Refill:  3  . atorvastatin (LIPITOR) 40 MG tablet    Sig: Take 1 tablet (40 mg total) by mouth daily at 6 PM.    Dispense:  90 tablet    Refill:  3  . isosorbide mononitrate (IMDUR) 30 MG 24 hr tablet    Sig: Take 1 tablet (30 mg total) by mouth daily.    Dispense:  90 tablet    Refill:  3  . metoprolol succinate (TOPROL-XL) 25 MG 24 hr tablet    Sig: Take 1 tablet (25 mg total) by mouth daily.    Dispense:  90 tablet    Refill:  3    Patient Instructions  Medication Instructions:  Your physician has recommended you make the following change in your medication:   An Rx for sublingual Nitro-glycerin use as directed.  Your cardiac medications have been refilled today.  If you need  a refill on your cardiac medications before your next appointment, please call your pharmacy.   Lab work: None ordered If you have labs (blood work) drawn today and your  tests are completely normal, you will receive your results only by: Marland Kitchen MyChart Message (if you have MyChart) OR . A paper copy in the mail If you have any lab test that is abnormal or we need to change your treatment, we will call you to review the results.  Testing/Procedures: None ordered  Follow-Up: At [facility], you and your health needs are our priority.  As part of our continuing mission to provide you with exceptional heart care, we have created designated Provider Care Teams.  These Care Teams include your primary Cardiologist (physician) and Advanced Practice Providers (APPs -  Physician Assistants and Nurse Practitioners) who all work together to provide you with the care you need, when you need it. Your physician recommends that you schedule a follow-up appointment in: 3 months    Any Other Special Instructions Will Be Listed Below (If Applicable). You have been referred to [facility]. They will contact you directly to initiate.   Nitroglycerin sublingual tablets What is this medicine? NITROGLYCERIN (nye troe GLI ser in) is a type of vasodilator. It relaxes blood vessels, increasing the blood and oxygen supply to your heart. This medicine is used to relieve chest pain caused by angina. It is also used to prevent chest pain before activities like climbing stairs, going outdoors in cold weather, or sexual activity. This medicine may be used for other purposes; ask your health care provider or pharmacist if you have questions. COMMON BRAND NAME(S): Nitroquick, Nitrostat, Nitrotab What should I tell my health care provider before I take this medicine? They need to know if you have any of these conditions: -anemia -head injury, recent stroke, or bleeding in the brain -liver disease -previous heart attack -an unusual or allergic reaction to nitroglycerin, other medicines, foods, dyes, or preservatives -pregnant or trying to get pregnant -breast-feeding How should  I use this medicine? Take this medicine by mouth as needed. At the first sign of an angina attack (chest pain or tightness) place one tablet under your tongue. You can also take this medicine 5 to 10 minutes before an event likely to produce chest pain. Follow the directions on the prescription label. Let the tablet dissolve under the tongue. Do not swallow whole. Replace the dose if you accidentally swallow it. It will help if your mouth is not dry. Saliva around the tablet will help it to dissolve more quickly. Do not eat or drink, smoke or chew tobacco while a tablet is dissolving. If you are not better within 5 minutes after taking ONE dose of nitroglycerin, call 9-1-1 immediately to seek emergency medical care. Do not take more than 3 nitroglycerin tablets over 15 minutes. If you take this medicine often to relieve symptoms of angina, your doctor or health care professional may provide you with different instructions to manage your symptoms. If symptoms do not go away after following these instructions, it is important to call 9-1-1 immediately. Do not take more than 3 nitroglycerin tablets over 15 minutes. Talk to your pediatrician regarding the use of this medicine in children. Special care may be needed. Overdosage: If you think you have taken too much of this medicine contact a poison control center or emergency room at once. NOTE: This medicine is only for you. Do not share this medicine with others. What if I  miss a dose? This does not apply. This medicine is only used as needed. What may interact with this medicine? Do not take this medicine with any of the following medications: -certain migraine medicines like ergotamine and dihydroergotamine (DHE) -medicines used to treat erectile dysfunction like sildenafil, tadalafil, and vardenafil -riociguat This medicine may also interact with the following medications: -alteplase -aspirin -heparin -medicines for high blood pressure -medicines  for mental depression -other medicines used to treat angina -phenothiazines like chlorpromazine, mesoridazine, prochlorperazine, thioridazine This list may not describe all possible interactions. Give your health care provider a list of all the medicines, herbs, non-prescription drugs, or dietary supplements you use. Also tell them if you smoke, drink alcohol, or use illegal drugs. Some items may interact with your medicine. What should I watch for while using this medicine? Tell your doctor or health care professional if you feel your medicine is no longer working. Keep this medicine with you at all times. Sit or lie down when you take your medicine to prevent falling if you feel dizzy or faint after using it. Try to remain calm. This will help you to feel better faster. If you feel dizzy, take several deep breaths and lie down with your feet propped up, or bend forward with your head resting between your knees. You may get drowsy or dizzy. Do not drive, use machinery, or do anything that needs mental alertness until you know how this drug affects you. Do not stand or sit up quickly, especially if you are an older patient. This reduces the risk of dizzy or fainting spells. Alcohol can make you more drowsy and dizzy. Avoid alcoholic drinks. Do not treat yourself for coughs, colds, or pain while you are taking this medicine without asking your doctor or health care professional for advice. Some ingredients may increase your blood pressure. What side effects may I notice from receiving this medicine? Side effects that you should report to your doctor or health care professional as soon as possible: -blurred vision -dry mouth -skin rash -sweating -the feeling of extreme pressure in the head -unusually weak or tired Side effects that usually do not require medical attention (report to your doctor or health care professional if they continue or are bothersome): -flushing of the face or  neck -headache -irregular heartbeat, palpitations -nausea, vomiting This list may not describe all possible side effects. Call your doctor for medical advice about side effects. You may report side effects to FDA at 1-800-FDA-1088. Where should I keep my medicine? Keep out of the reach of children. Store at room temperature between 20 and 25 degrees C (68 and 77 degrees F). Store in Chief of Staff. Protect from light and moisture. Keep tightly closed. Throw away any unused medicine after the expiration date. NOTE: This sheet is a summary. It may not cover all possible information. If you have questions about this medicine, talk to your doctor, pharmacist, or health care provider.  2018 Elsevier/Gold Standard (2012-12-30 17:57:36)    Mediterranean Diet A Mediterranean diet refers to food and lifestyle choices that are based on the traditions of countries located on the [employer]. This way of eating has been shown to help prevent certain conditions and improve outcomes for people who have chronic diseases, like kidney disease and heart disease. What are tips for following this plan? Lifestyle  Cook and eat meals together with your family, when possible.  Drink enough fluid to keep your urine clear or pale yellow.  Be physically active every day. This  includes: ? Aerobic exercise like running or swimming. ? Leisure activities like gardening, walking, or housework.  Get 7-8 hours of sleep each night.  If recommended by your health care provider, drink red wine in moderation. This means 1 glass a day for nonpregnant women and 2 glasses a day for men. A glass of wine equals 5 oz (150 mL). Reading food labels  Check the serving size of packaged foods. For foods such as rice and pasta, the serving size refers to the amount of cooked product, not dry.  Check the total fat in packaged foods. Avoid foods that have saturated fat or trans fats.  Check the ingredients list for added  sugars, such as corn syrup. Shopping  At the grocery store, buy most of your food from the areas near the walls of the store. This includes: ? Fresh fruits and vegetables (produce). ? Grains, beans, nuts, and seeds. Some of these may be available in unpackaged forms or large amounts (in bulk). ? Fresh seafood. ? Poultry and eggs. ? Low-fat dairy products.  Buy whole ingredients instead of prepackaged foods.  Buy fresh fruits and vegetables in-season from local farmers markets.  Buy frozen fruits and vegetables in resealable bags.  If you do not have access to quality fresh seafood, buy precooked frozen shrimp or canned fish, such as tuna, salmon, or sardines.  Buy small amounts of raw or cooked vegetables, salads, or olives from the deli or salad bar at your store.  Stock your pantry so you always have certain foods on hand, such as olive oil, canned tuna, canned tomatoes, rice, pasta, and beans. Cooking  Cook foods with extra-virgin olive oil instead of using butter or other vegetable oils.  Have meat as a side dish, and have vegetables or grains as your main dish. This means having meat in small portions or adding small amounts of meat to foods like pasta or stew.  Use beans or vegetables instead of meat in common dishes like chili or lasagna.  Experiment with different cooking methods. Try roasting or broiling vegetables instead of steaming or sauteing them.  Add frozen vegetables to soups, stews, pasta, or rice.  Add nuts or seeds for added healthy fat at each meal. You can add these to yogurt, salads, or vegetable dishes.  Marinate fish or vegetables using olive oil, lemon juice, garlic, and fresh herbs. Meal planning  Plan to eat 1 vegetarian meal one day each week. Try to work up to 2 vegetarian meals, if possible.  Eat seafood 2 or more times a week.  Have healthy snacks readily available, such as: ? Vegetable sticks with hummus. ? [place] yogurt. ? Fruit and nut  trail mix.  Eat balanced meals throughout the week. This includes: ? Fruit: 2-3 servings a day ? Vegetables: 4-5 servings a day ? Low-fat dairy: 2 servings a day ? Fish, poultry, or lean meat: 1 serving a day ? Beans and legumes: 2 or more servings a week ? Nuts and seeds: 1-2 servings a day ? Whole grains: 6-8 servings a day ? Extra-virgin olive oil: 3-4 servings a day  Limit red meat and sweets to only a few servings a month What are my food choices?  Mediterranean diet ? Recommended ? Grains: Whole-grain pasta. Brown rice. Bulgar wheat. Polenta. Couscous. Whole-wheat bread. [place]. ? Vegetables: Artichokes. Beets. Broccoli. Cabbage. Carrots. Eggplant. Green beans. Chard. Kale. Spinach. Onions. Leeks. Peas. Squash. Tomatoes. Peppers. Radishes. ? Fruits: Apples. Apricots. Avocado. Berries. Bananas. Cherries. Dates. Figs.  Grapes. Lemons. Melon. Oranges. Peaches. Plums. Pomegranate. ? Meats and other protein foods: Beans. Almonds. Sunflower seeds. Pine nuts. Peanuts. [place]. Salmon. Scallops. Shrimp. [place]. Tilapia. Clams. Oysters. Eggs. ? Dairy: Low-fat milk. Cheese. Greek yogurt. ? Beverages: Water. Red wine. Herbal tea. ? Fats and oils: Extra virgin olive oil. Avocado oil. Grape seed oil. ? Sweets and desserts: [place] yogurt with honey. Baked apples. Poached pears. Trail mix. ? Seasoning and other foods: Basil. Cilantro. Coriander. Cumin. Mint. Parsley. Sage. Rosemary. Tarragon. Garlic. Oregano. Thyme. Pepper. Balsalmic vinegar. Tahini. Hummus. Tomato sauce. Olives. Mushrooms. ? Limit these ? Grains: Prepackaged pasta or rice dishes. Prepackaged cereal with added sugar. ? Vegetables: Deep fried potatoes (french fries). ? Fruits: Fruit canned in syrup. ? Meats and other protein foods: Beef. Pork. Lamb. Poultry with skin. Hot dogs. [place] Salines. ? Dairy: Ice cream. Sour cream. Whole milk. ? Beverages: Juice. Sugar-sweetened soft drinks. Beer. Liquor and spirits. ? Fats and oils: Butter.  [place]ola oil. Vegetable oil. Beef fat (tallow). Lard. ? Sweets and desserts: Cookies. Cakes. Pies. Candy. ? Seasoning and other foods: Mayonnaise. Premade sauces and marinades. ? The items listed may not be a complete list. Talk with your dietitian about what dietary choices are right for you. Summary  The Mediterranean diet includes both food and lifestyle choices.  Eat a variety of fresh fruits and vegetables, beans, nuts, seeds, and whole grains.  Limit the amount of red meat and sweets that you eat.  Talk with your health care provider about whether it is safe for you to drink red wine in moderation. This means 1 glass a day for nonpregnant women and 2 glasses a day for men. A glass of wine equals 5 oz (150 mL). This information is not intended to replace advice given to you by your health care provider. Make sure you discuss any questions you have with your health care provider. Document Released: 10/25/2015 Document Revised: 11/27/2015 Document Reviewed: 10/25/2015 Elsevier Interactive Patient Education  2018 [place], Elouise Munroe, MD  02/21/2018 9:20 AM    [place]

## 2018-02-27 ENCOUNTER — Ambulatory Visit (INDEPENDENT_AMBULATORY_CARE_PROVIDER_SITE_OTHER): Payer: 59

## 2018-02-27 ENCOUNTER — Ambulatory Visit (HOSPITAL_COMMUNITY)
Admission: EM | Admit: 2018-02-27 | Discharge: 2018-02-27 | Disposition: A | Discharge status: Home / Self Care | Payer: 59 | Attending: Internal Medicine | Admitting: Internal Medicine

## 2018-02-27 ENCOUNTER — Other Ambulatory Visit: Payer: Self-pay

## 2018-02-27 ENCOUNTER — Encounter (HOSPITAL_COMMUNITY): Payer: Self-pay | Admitting: Emergency Medicine

## 2018-02-27 DIAGNOSIS — S92511A Displaced fracture of proximal phalanx of right lesser toe(s), initial encounter for closed fracture: Secondary | ICD-10-CM | POA: Diagnosis not present

## 2018-02-27 NOTE — Discharge Instructions (Signed)
Your x-ray did show a fracture of the right pinky toe. We will buddy tape the toes and patient hard soled shoe You need to follow-up with orthopedic for further evaluation and management

## 2018-02-27 NOTE — ED Triage Notes (Signed)
The patient presented to the [facility] with a complaint of pain to the right foot after hitting her foot on a litter box 3 days ago and then again last night. The patient presented with swelling and bruising to the right foot.

## 2018-02-28 NOTE — ED Provider Notes (Signed)
[facility]    CSN: [id number] Arrival date & time: 02/27/18  1651     History   Chief Complaint Chief Complaint  Patient presents with  . Foot Pain    HPI Diane Barker is a 67 y.o. female.   Patient is a 67 year old female who presents with right foot pain, bruising after hitting her foot on a litter box approximate 3 days ago.  Her pain is been constant and worsening.  She does have bruising to the proximal phalanges worse to the fifth toe.  Fifth toe mildly red and swollen.  Limited flexion of the fifth toe. It has been more painful to walk on the foot.  She has been putting ice and resting the foot.  She took Tylenol for pain with minimal relief.  Denies any numbness, tingling, loss of sensation.  ROS per HPI      Past Medical History:  Diagnosis Date  . Anxiety   . Arthritis    "all over my body" (01/15/2018)  . Breast cancer, right breast ([place]) 2000   Rt lumpectomy, Chemo/XRT/rt axillary node   . Bronchial asthma   . Chronic lower back pain   . Depression   . Diverticulosis   . DVT (deep venous thrombosis) ([place]) 06/2013   RLE  . GERD (gastroesophageal reflux disease)   . High cholesterol   . Hx of adenomatous colonic polyps   . Hypertension   . Obesity   . Osteoarthritis   . Personal history of chemotherapy   . Personal history of radiation therapy   . Pulmonary embolism ([place]) 06/2013   "both lungs"  . Spondylolisthesis     Patient Active Problem List   Diagnosis Date Noted  . Unstable angina ([place]) 01/16/2018  . Primary osteoarthritis of both knees 07/29/2017  . Cellulitis of right arm 10/07/2016  . Sepsis ([place]) 10/07/2016  . Chest pain 10/07/2016  . Dyspnea on exertion 05/24/2015  . DVT, lower extremity ([place]) 05/09/2014  . Right calf pain 06/21/2013  . Pulmonary embolism ([place]) 06/21/2013  . Dysphagia 06/27/2011  . Cough 06/27/2011  . CHEST PAIN 01/18/2009  . RHINITIS 04/13/2007  . Obstructive chronic bronchitis with acute  bronchitis ([place]) 04/13/2007  . GERD 04/13/2007  . Depression 01/21/2007  . OSTEOARTHRITIS 01/21/2007  . LOW BACK PAIN 01/21/2007  . BREAST CANCER, HX OF 01/21/2007    Past Surgical History:  Procedure Laterality Date  . BREAST BIOPSY Right 2000  . BREAST LUMPECTOMY Right 2000   for CA txed w/ chemo and radiation, right  . CARDIAC CATHETERIZATION    . HERNIA REPAIR    . INTRAVASCULAR PRESSURE WIRE/FFR STUDY N/A 01/18/2018   Procedure: INTRAVASCULAR PRESSURE WIRE/FFR STUDY;  Surgeon: Burnell Blanks, MD;  Location: [facility];  Service: Cardiovascular;  Laterality: N/A;  . LEFT HEART CATH AND CORONARY ANGIOGRAPHY N/A 01/18/2018   Procedure: LEFT HEART CATH AND CORONARY ANGIOGRAPHY;  Surgeon: Burnell Blanks, MD;  Location: [facility];  Service: Cardiovascular;  Laterality: N/A;  . TUBAL LIGATION    . UMBILICAL HERNIA REPAIR  1983    OB History   No obstetric history on file.      Home Medications    Prior to Admission medications   Medication Sig Start Date End Date Taking? Authorizing Provider  acetaminophen (TYLENOL) 500 MG tablet Take 500 mg by mouth every 6 (six) hours as needed for mild pain.    [provider]  aspirin EC 81 MG EC  tablet Take 1 tablet (81 mg total) by mouth daily. 01/20/18   Patrecia Pour, MD  atorvastatin (LIPITOR) 40 MG tablet Take 1 tablet (40 mg total) by mouth daily at 6 PM. 02/05/18   Elouise Munroe, MD  Cholecalciferol (VITAMIN D) 2000 UNITS CAPS Take 4,000 Units by mouth daily.     [provider]  ciclopirox (LOPROX) 0.77 % cream Apply 1 application topically 2 (two) times daily.    [provider]  dexlansoprazole (DEXILANT) 60 MG capsule Take 60 mg by mouth daily.    [provider]  docusate sodium (COLACE) 100 MG capsule Take 100 mg by mouth 2 (two) times daily.    [provider]  escitalopram (LEXAPRO) 10 MG tablet Take 10 mg by mouth.    [provider]    isosorbide mononitrate (IMDUR) 30 MG 24 hr tablet Take 1 tablet (30 mg total) by mouth daily. 02/05/18   Elouise Munroe, MD  metoprolol succinate (TOPROL-XL) 25 MG 24 hr tablet Take 1 tablet (25 mg total) by mouth daily. 02/05/18   Elouise Munroe, MD  nitroGLYCERIN (NITROSTAT) 0.4 MG SL tablet Place 1 tablet (0.4 mg total) under the tongue every 5 (five) minutes as needed. 02/05/18   Elouise Munroe, MD  PROAIR HFA 108 [phone] Base) MCG/ACT inhaler Inhale 2 puffs into the lungs every 4 (four) hours as needed for wheezing or shortness of breath.  09/06/17   [provider]  vitamin B-12 (CYANOCOBALAMIN) 500 MCG tablet Take 500 mcg by mouth daily.    [provider]  XARELTO 20 MG TABS tablet Take 1 tablet by mouth daily. 04/10/14   [provider]    Family History Family History  Problem Relation Age of Onset  . Asthma Mother   . COPD Mother   . Heart attack Mother   . Diabetes Mother   . Lung cancer Father   . Asthma Daughter   . Kidney failure Brother   . Diabetes Brother        x 2  . Heart attack Brother   . Breast cancer Maternal Aunt   . Colon cancer Paternal Aunt   . Diabetes Daughter        x 2  . Hypertension Brother     Social History Social History   Tobacco Use  . Smoking status: Never Smoker  . Smokeless tobacco: Never Used  Substance Use Topics  . Alcohol use: Never    Alcohol/week: 0.0 standard drinks    Frequency: Never  . Drug use: Never     Allergies   Erythromycin; Gabapentin; Morphine; Penicillins; and Relafen [nabumetone]   Review of Systems Review of Systems   Physical Exam Triage Vital Signs ED Triage Vitals [02/27/18 1746]  Enc Vitals Group     BP 130/89     Pulse Rate 74     Resp 18     Temp 97.9 F (36.6 C)     Temp Source Oral     SpO2 95 %     Weight      Height      Head Circumference      Peak Flow      Pain Score 2     Pain Loc      Pain Edu?      Excl. in [facility]?    No data  found.  Updated Vital Signs BP 130/89 (BP Location: Left Arm)   Pulse 74   Temp 97.9  F (36.6 C) (Oral)   Resp 18   SpO2 95%   Visual Acuity Right Eye Distance:   Left Eye Distance:   Bilateral Distance:    Right Eye Near:   Left Eye Near:    Bilateral Near:     Physical Exam Vitals signs and nursing note reviewed.  Constitutional:      Appearance: Normal appearance.  HENT:     Head: Normocephalic and atraumatic.     Nose: Nose normal.  Neck:     Musculoskeletal: Normal range of motion.  Pulmonary:     Effort: Pulmonary effort is normal.  Musculoskeletal:        General: Swelling and tenderness present.     Comments: Tenderness and bruising  to to the 2nd through 5th proximal phalanges.  5th phalanx. Limited flexion of the. Sensation and pulse intact.   Skin:    General: Skin is warm and dry.  Neurological:     Mental Status: She is alert.  Psychiatric:        Mood and Affect: Mood normal.      UC Treatments / Results  Labs (all labs ordered are listed, but only abnormal results are displayed) Labs Reviewed - No data to display  EKG None  Radiology Dg Foot Complete Right  Result Date: 02/27/2018 CLINICAL DATA:  Struck her RIGHT foot on a litter box 5 days ago injuring second through fifth toes, bruising and pain EXAM: RIGHT FOOT COMPLETE - 3+ VIEW COMPARISON:  None FINDINGS: Osseous demineralization. Joint space narrowing and spur formation at first MTP joint. Joint spaces otherwise preserved. Minimally displaced fracture identified at distal aspect of proximal phalanx RIGHT fifth toe. Observed fracture is comminuted and extends intra-articular at the PIP joint No additional fracture, dislocation, or bone destruction. Moderate-sized plantar calcaneal spur. IMPRESSION: Minimally displaced comminuted fracture at distal aspect of proximal phalanx, extending intra-articular at the PIP joint. Electronically Signed   By: Lavonia Dana M.D.   On: 02/27/2018 17:58     Procedures Procedures (including critical care time)  Medications Ordered in UC Medications - No data to display  Initial Impression / Assessment and Plan / UC Course  I have reviewed the triage vital signs and the nursing notes.  Pertinent labs & imaging results that were available during my care of the patient were reviewed by me and considered in my medical decision making (see chart for details).     X-ray revealed minimally displaced comminuted fracture of distal aspect of proximal phalanx extending into the PIP joint Will buddy tape toes and place in postop shoe Rest, ice, elevate and Tylenol for pain as needed Follow-up with orthopedics within the next week Patient understanding and agreeable to plan. Final Clinical Impressions(s) / UC Diagnoses   Final diagnoses:  Closed displaced fracture of proximal phalanx of lesser toe of right foot, initial encounter     Discharge Instructions     Your x-ray did show a fracture of the right pinky toe. We will buddy tape the toes and patient hard soled shoe You need to follow-up with orthopedic for further evaluation and management    ED Prescriptions    None     Controlled Substance Prescriptions [place] Controlled Substance Registry consulted? Not Applicable   Orvan July, NP 02/28/18 1307

## 2018-03-03 ENCOUNTER — Telehealth (HOSPITAL_COMMUNITY): Payer: Self-pay

## 2018-03-03 NOTE — Telephone Encounter (Signed)
Pt insurance is active and benefits verified through [facility]. Co-pay $0.00, DED $185.00/$185.00 met, out of pocket $0.00/$0.00 met, co-insurance 20%. No pre-authorization required. Passport, 03/03/18 @ 10:48AM, REF# [id number]  Pt's 2ndary insurance is active through [place]. CNG#[id number]  Will fax over [facility] Reimbursement form to Dr. Margaretann Loveless

## 2018-03-08 ENCOUNTER — Ambulatory Visit (INDEPENDENT_AMBULATORY_CARE_PROVIDER_SITE_OTHER): Payer: 59 | Admitting: Physician Assistant

## 2018-03-08 ENCOUNTER — Encounter (INDEPENDENT_AMBULATORY_CARE_PROVIDER_SITE_OTHER): Payer: Self-pay | Admitting: Physician Assistant

## 2018-03-08 DIAGNOSIS — M79671 Pain in right foot: Secondary | ICD-10-CM

## 2018-03-08 NOTE — Progress Notes (Signed)
Office Visit Note   Patient: Diane Barker           Date of Birth: 1950/03/18           MRN: [id number] Visit Date: 03/08/2018              Requested by: Shon Baton, [address] [place], [place] [address] PCP: Shon Baton, MD   Assessment & Plan: Visit Diagnoses:  1. Pain in right foot   2. 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  Continue the postop shoe until 4 weeks status post injury.  Then would slowly progress to a regular shoe taping for the next 2 weeks and then discontinue.  She will return on as-needed basis or if she has any questions or concerns.  She will also return if she has any deformity of the toe.  Follow-Up Instructions: Return if symptoms worsen or fail to improve.   Orders:  No orders of the defined types were placed in this encounter.  No orders of the defined types were placed in this encounter.     Procedures: No procedures performed   Clinical Data: No additional findings.   Subjective: Chief Complaint  Patient presents with  . Right Foot - Pain    HPI Diane Barker is a 67 year old female who approximately a month ago walked into a Box and injured her right fifth toe.  She became concerned due to the pain and burning in the toe but went to the urgent care on 02/27/2018 where radiographs were obtained.  These films are personally reviewed and these show right fifth proximal phalanx fracture.  There is minimal displacement.  The PIP joint is well-maintained.  Fracture does appear to extend up to the intra-articular joint line but the joint line appears to be well maintained. She is placed in a postop shoe and told to buddy tape the fourth and fifth toes.  She denies any other injury. Review of Systems Please see HPI  Objective: Vital Signs: There were no vitals taken for this visit.  Physical Exam General: Well-developed well-nourished female no  acute distress. Ortho Exam Right foot no edema of the fifth toe.  Ecchymosis about the dorsal second third metatarsal heads.  She has tenderness only over the fifth toe.  Remainder the foot is nontender.  No gross deformity of the fifth toe.  Dorsal pedal pulses intact. Specialty Comments:  No specialty comments available.  Imaging: No results found.   PMFS History: Patient Active Problem List   Diagnosis Date Noted  . Unstable angina ([place]) 01/16/2018  . Primary osteoarthritis of both knees 07/29/2017  . Cellulitis of right arm 10/07/2016  . Sepsis ([place]) 10/07/2016  . Chest pain 10/07/2016  . Dyspnea on exertion 05/24/2015  . DVT, lower extremity ([place]) 05/09/2014  . Right calf pain 06/21/2013  . Pulmonary embolism ([place]) 06/21/2013  . Dysphagia 06/27/2011  . Cough 06/27/2011  . CHEST PAIN 01/18/2009  . RHINITIS 04/13/2007  . Obstructive chronic bronchitis with acute bronchitis ([place]) 04/13/2007  . GERD 04/13/2007  . Depression 01/21/2007  . OSTEOARTHRITIS 01/21/2007  . LOW BACK PAIN 01/21/2007  . BREAST CANCER, HX OF 01/21/2007   Past Medical History:  Diagnosis Date  . Anxiety   . Arthritis    "all over my body" (01/15/2018)  . Breast cancer, right breast ([place]) 2000   Rt lumpectomy, Chemo/XRT/rt axillary node   .  Bronchial asthma   . Chronic lower back pain   . Depression   . Diverticulosis   . DVT (deep venous thrombosis) ([place]) 06/2013   RLE  . GERD (gastroesophageal reflux disease)   . High cholesterol   . Hx of adenomatous colonic polyps   . Hypertension   . Obesity   . Osteoarthritis   . Personal history of chemotherapy   . Personal history of radiation therapy   . Pulmonary embolism ([place]) 06/2013   "both lungs"  . Spondylolisthesis     Family History  Problem Relation Age of Onset  . Asthma Mother   . COPD Mother   . Heart attack Mother   . Diabetes Mother   . Lung cancer Father   . Asthma Daughter   . Kidney failure Brother   . Diabetes Brother          x 2  . Heart attack Brother   . Breast cancer Maternal Aunt   . Colon cancer Paternal Aunt   . Diabetes Daughter        x 2  . Hypertension Brother     Past Surgical History:  Procedure Laterality Date  . BREAST BIOPSY Right 2000  . BREAST LUMPECTOMY Right 2000   for CA txed w/ chemo and radiation, right  . CARDIAC CATHETERIZATION    . HERNIA REPAIR    . INTRAVASCULAR PRESSURE WIRE/FFR STUDY N/A 01/18/2018   Procedure: INTRAVASCULAR PRESSURE WIRE/FFR STUDY;  Surgeon: Burnell Blanks, MD;  Location: [facility];  Service: Cardiovascular;  Laterality: N/A;  . LEFT HEART CATH AND CORONARY ANGIOGRAPHY N/A 01/18/2018   Procedure: LEFT HEART CATH AND CORONARY ANGIOGRAPHY;  Surgeon: Burnell Blanks, MD;  Location: [facility];  Service: Cardiovascular;  Laterality: N/A;  . TUBAL LIGATION    . [place]   Social History   Occupational History  . 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 Smoking status: Never Smoker  . Smokeless tobacco: Never Used  Substance and Sexual Activity  . Alcohol use: Never    Alcohol/week: 0.0 standard drinks    Frequency: Never  . Drug use: Never  . Sexual activity: Not Currently

## 2018-03-19 ENCOUNTER — Telehealth (HOSPITAL_COMMUNITY): Payer: Self-pay

## 2018-03-19 NOTE — Telephone Encounter (Signed)
Called patient to see if she was interested in participating in the Cardiac Rehab Program. Patient stated yes. Patient will come in for orientation on 04/27/18 @ 815AM and will attend the 1:15PM exercise class.  Mailed homework package.  went over insurance, patient verbalized understanding.Marland Kitchen

## 2018-04-20 ENCOUNTER — Telehealth (HOSPITAL_COMMUNITY): Payer: Self-pay

## 2018-04-20 NOTE — Telephone Encounter (Signed)
Cardiac Rehab Medication Review by a Pharmacist  Does the patient feel that his/her medications are working for him/her?  yes  Has the patient been experiencing any side effects to the medications prescribed?  Yes - no energy, no appetite  Does the patient measure his/her own blood pressure or blood glucose at home?  Not recently   Does the patient have any problems obtaining medications due to transportation or finances?   no  Understanding of regimen: fair Understanding of indications: fair Potential of compliance: fair    Pharmacist comments: Patient reports that there was confusion about whether or not she should be taking aspirin since getting out of the hospital. She bought some but wasn't sure if it was correct so she has not been taking it. She plans to follow-up with cardiologist about this on 2/10. She also has not been taking vitamin B-12 since she is waiting to hear back from her physician about whether or not she needs to continue taking this.    Jackson Latino, PharmD PGY1 Pharmacy Resident Phone [phone] 04/20/2018     4:50 PM

## 2018-04-23 NOTE — Progress Notes (Signed)
Diane Barker 68 y.o. female DOB 1951-01-06 MRN [id number]       Nutrition Screen Note  No diagnosis found. Past Medical History:  Diagnosis Date  . Anxiety   . Arthritis    "all over my body" (01/15/2018)  . Breast cancer, right breast ([place]) 2000   Rt lumpectomy, Chemo/XRT/rt axillary node   . Bronchial asthma   . Chronic lower back pain   . Depression   . Diverticulosis   . DVT (deep venous thrombosis) ([place]) 06/2013   RLE  . GERD (gastroesophageal reflux disease)   . High cholesterol   . Hx of adenomatous colonic polyps   . Hypertension   . Obesity   . Osteoarthritis   . Personal history of chemotherapy   . Personal history of radiation therapy   . Pulmonary embolism ([place]) 06/2013   "both lungs"  . Spondylolisthesis    Meds reviewed.     Current Outpatient Medications (Cardiovascular):  .  atorvastatin (LIPITOR) 40 MG tablet, Take 1 tablet (40 mg total) by mouth daily at 6 PM. .  isosorbide mononitrate (IMDUR) 30 MG 24 hr tablet, Take 1 tablet (30 mg total) by mouth daily. .  metoprolol succinate (TOPROL-XL) 25 MG 24 hr tablet, Take 1 tablet (25 mg total) by mouth daily. .  nitroGLYCERIN (NITROSTAT) 0.4 MG SL tablet, Place 1 tablet (0.4 mg total) under the tongue every 5 (five) minutes as needed.  Current Outpatient Medications (Respiratory):  .  PROAIR HFA 108 (90 Base) MCG/ACT inhaler, Inhale 2 puffs into the lungs every 4 (four) hours as needed for wheezing or shortness of breath.   Current Outpatient Medications (Analgesics):  .  acetaminophen (TYLENOL) 500 MG tablet, Take 500 mg by mouth every 6 (six) hours as needed for mild pain. Marland Kitchen  aspirin EC 81 MG EC tablet, Take 1 tablet (81 mg total) by mouth daily. (Patient not taking: Reported on 04/20/2018)  Current Outpatient Medications (Hematological):  .  vitamin B-12 (CYANOCOBALAMIN) 500 MCG tablet, Take 500 mcg by mouth daily. Alveda Reasons 20 MG TABS tablet, Take 1 tablet by mouth daily.  Current Outpatient  Medications (Other):  Marland Kitchen  Cholecalciferol (VITAMIN D) 2000 UNITS CAPS, Take 4,000 Units by mouth daily.  .  ciclopirox (LOPROX) 0.77 % cream, Apply 1 application topically 2 (two) times daily. Marland Kitchen  dexlansoprazole (DEXILANT) 60 MG capsule, Take 60 mg by mouth daily. Marland Kitchen  escitalopram (LEXAPRO) 10 MG tablet, Take 10 mg by mouth.   HT: Ht Readings from Last 1 Encounters:  02/05/18 5\' 5"  (1.651 m)    WT: Wt Readings from Last 5 Encounters:  02/05/18 264 lb 6.4 oz (119.9 kg)  01/19/18 259 lb 6.4 oz (117.7 kg)  01/08/18 262 lb (118.8 kg)  12/04/17 250 lb (113.4 kg)  10/12/17 254 lb (115.2 kg)     BMI = 44   02/05/18  Current tobacco use? No       Labs:  Lipid Panel     Component Value Date/Time   CHOL 182 01/16/2018 0238   TRIG 60 01/16/2018 0238   HDL 46 01/16/2018 0238   CHOLHDL 4.0 01/16/2018 0238   VLDL 12 01/16/2018 0238   LDLCALC 124 (H) 01/16/2018 0238    Lab Results  Component Value Date   HGBA1C 5.3 01/16/2018   CBG (last 3)  No results for input(s): GLUCAP in the last 72 hours.  Nutrition Diagnosis ? Food-and nutrition-related knowledge deficit related to lack of exposure to information as related to  diagnosis of: ? CVD  ? Obese  III = 40+ related to excessive energy intake as evidenced by a BMI = 44  02/05/18  Nutrition Goal(s):  ? To be determined  Plan:  Pt to attend nutrition classes ? Nutrition I ? Nutrition II ? Portion Distortion  Will provide client-centered nutrition education as part of interdisciplinary care.   Monitor and evaluate progress toward nutrition goal with team.  Laurina Bustle, MS, RD, LDN 04/23/2018 12:18 PM

## 2018-04-26 ENCOUNTER — Ambulatory Visit (INDEPENDENT_AMBULATORY_CARE_PROVIDER_SITE_OTHER): Payer: 59 | Admitting: Internal Medicine

## 2018-04-26 ENCOUNTER — Encounter: Payer: Self-pay | Admitting: Internal Medicine

## 2018-04-26 VITALS — BP 115/74 | HR 64 | Ht 65.5 in | Wt 264.8 lb

## 2018-04-26 DIAGNOSIS — F329 Major depressive disorder, single episode, unspecified: Secondary | ICD-10-CM

## 2018-04-26 DIAGNOSIS — R4589 Other symptoms and signs involving emotional state: Secondary | ICD-10-CM

## 2018-04-26 DIAGNOSIS — I2510 Atherosclerotic heart disease of native coronary artery without angina pectoris: Secondary | ICD-10-CM | POA: Diagnosis not present

## 2018-04-26 NOTE — Patient Instructions (Signed)
Medication Instructions:  Your physician recommends that you continue on your current medications as directed. Please refer to the Current Medication list given to you today.  HOLD Metoprolol for 2 weeks. Please call the office to give an update in your symptoms.   If you need a refill on your cardiac medications before your next appointment, please call your pharmacy.   Lab work: None ordered  Testing/Procedures: None ordered  Follow-Up: At [facility], you and your health needs are our priority.  As part of our continuing mission to provide you with exceptional heart care, we have created designated Provider Care Teams.  These Care Teams include your primary Cardiologist (physician) and Advanced Practice Providers (APPs -  Physician Assistants and Nurse Practitioners) who all work together to provide you with the care you need, when you need it. You will need a follow up appointment in 6 weeks.

## 2018-04-27 ENCOUNTER — Encounter (HOSPITAL_COMMUNITY)
Admission: RE | Admit: 2018-04-27 | Discharge: 2018-04-27 | Disposition: A | Discharge status: Home / Self Care | Payer: 59 | Source: Ambulatory Visit | Attending: Internal Medicine | Admitting: Internal Medicine

## 2018-04-27 VITALS — BP 125/69 | HR 73 | Ht 65.5 in | Wt 262.1 lb

## 2018-04-27 DIAGNOSIS — I208 Other forms of angina pectoris: Secondary | ICD-10-CM | POA: Insufficient documentation

## 2018-04-27 DIAGNOSIS — I2089 Other forms of angina pectoris: Secondary | ICD-10-CM

## 2018-04-27 NOTE — Progress Notes (Addendum)
Cardiac Individual Treatment Plan  Patient Details  Name: Diane Barker MRN: [id number] Date of Birth: 01/25/1951 Referring Provider:   Flowsheet Row CARDIAC REHAB PHASE II ORIENTATION from 04/27/2018 in [facility]  Referring Provider  Elouise Munroe, MD      Initial Encounter Date:  [place] PHASE II ORIENTATION from 04/27/2018 in [facility]  Date  04/27/18      Visit Diagnosis: Stable angina ([place])  Patient's Home Medications on Admission:  Current Outpatient Medications:  .  acetaminophen (TYLENOL) 500 MG tablet, Take 500 mg by mouth every 6 (six) hours as needed for mild pain., Disp: , Rfl:  .  aspirin EC 81 MG EC tablet, Take 1 tablet (81 mg total) by mouth daily., Disp: 30 tablet, Rfl: 0 .  atorvastatin (LIPITOR) 40 MG tablet, Take 1 tablet (40 mg total) by mouth daily at 6 PM., Disp: 90 tablet, Rfl: 3 .  Cholecalciferol (VITAMIN D) 2000 UNITS CAPS, Take 4,000 Units by mouth daily. , Disp: , Rfl:  .  ciclopirox (LOPROX) 0.77 % cream, Apply 1 application topically 2 (two) times daily., Disp: , Rfl:  .  dexlansoprazole (DEXILANT) 60 MG capsule, Take 60 mg by mouth daily., Disp: , Rfl:  .  escitalopram (LEXAPRO) 10 MG tablet, Take 10 mg by mouth., Disp: , Rfl:  .  isosorbide mononitrate (IMDUR) 30 MG 24 hr tablet, Take 1 tablet (30 mg total) by mouth daily., Disp: 90 tablet, Rfl: 3 .  nitroGLYCERIN (NITROSTAT) 0.4 MG SL tablet, Place 1 tablet (0.4 mg total) under the tongue every 5 (five) minutes as needed., Disp: 25 tablet, Rfl: 3 .  PROAIR HFA 108 (90 Base) MCG/ACT inhaler, Inhale 2 puffs into the lungs every 4 (four) hours as needed for wheezing or shortness of breath. , Disp: , Rfl: 2 .  vitamin B-12 (CYANOCOBALAMIN) 500 MCG tablet, Take 500 mcg by mouth daily., Disp: , Rfl:  .  XARELTO 20 MG TABS tablet, Take 1 tablet by mouth daily., Disp: , Rfl: 5 .  metoprolol succinate (TOPROL-XL) 25 MG 24  hr tablet, Take 1 tablet (25 mg total) by mouth daily. (Patient not taking: Reported on 04/27/2018), Disp: 90 tablet, Rfl: 3  Past Medical History: Past Medical History:  Diagnosis Date  . Anxiety   . Arthritis    "all over my body" (01/15/2018)  . Breast cancer, right breast ([place]) 2000   Rt lumpectomy, Chemo/XRT/rt axillary node   . Bronchial asthma   . Chronic lower back pain   . Depression   . Diverticulosis   . DVT (deep venous thrombosis) ([place]) 06/2013   RLE  . GERD (gastroesophageal reflux disease)   . High cholesterol   . Hx of adenomatous colonic polyps   . Hypertension   . Obesity   . Osteoarthritis   . Personal history of chemotherapy   . Personal history of radiation therapy   . Pulmonary embolism ([place]) 06/2013   "both lungs"  . Spondylolisthesis     Tobacco Use: Social History   Tobacco Use  Smoking Status Never Smoker  Smokeless Tobacco Never Used    Labs: Recent Review Flowsheet Data    Labs for ITP Cardiac and Pulmonary Rehab Latest Ref Rng & Units 12/18/2006 02/03/2008 02/04/2008 03/23/2015 01/16/2018   Cholestrol 0 - 200 mg/dL - - 192 ATP III CLASSIFICATION: <200     mg/dL   Desirable 200-239  mg/dL   Borderline High >=240  mg/dL   High - 182   LDLCALC 0 - 99 mg/dL - - 130 Total Cholesterol/HDL:CHD Risk Coronary Heart Disease Risk Table Men   Women 1/2 Average Risk   3.4   3.3(H) - 124(H)   HDL >40 mg/dL - - 48 - 46   Trlycerides <150 mg/dL - - 70 - 60   Hemoglobin A1c 4.8 - 5.6 % 5.5 5.7 (NOTE)   The ADA recommends the following therapeutic goal for glycemic   control related to Hgb A1C measurement:   Goal of Therapy:   < 7.0% Hgb A1C   Reference: American Diabetes Association: Clinical Practice   Recommendations 2008, Diabetes Care, 2008, 31:(Suppl 1). - 5.8(H) 5.3      Capillary Blood Glucose: No results found for: GLUCAP   Exercise Target Goals: Exercise Program Goal: Individual exercise prescription set using results from initial 6 min  walk test and THRR while considering  patient's activity barriers and safety.   Exercise Prescription Goal: Initial exercise prescription builds to 30-45 minutes a day of aerobic activity, 2-3 days per week.  Home exercise guidelines will be given to patient during program as part of exercise prescription that the participant will acknowledge.  Activity Barriers & Risk Stratification: Activity Barriers & Cardiac Risk Stratification - 04/27/18 0747    Activity Barriers & Cardiac Risk Stratification          Activity Barriers  Arthritis;Other (comment)    Comments  OA- low back pain    Cardiac Risk Stratification  High           6 Minute Walk: 6 Minute Walk    6 Minute Walk    Row Name 04/27/18 0804   Phase  Initial   Distance  1459 feet   Walk Time  6 minutes   # of Rest Breaks  0   MPH  2.76   METS  2.39   RPE  12   Perceived Dyspnea   0   VO2 Peak  8.36   Symptoms  Yes (comment)   Comments  Patient c/o low back pain, "3-4" out of 10 on the pain scale. Pt also states she was dizzy/lightheaded the last couple of laps of the walk test. Resolved with rest.   Resting HR  73 bpm   Resting BP  125/69   Resting Oxygen Saturation   97 %   Exercise Oxygen Saturation  during 6 min walk  97 %   Max Ex. HR  104 bpm   Max Ex. BP  124/84   2 Minute Post BP  130/69          Oxygen Initial Assessment:   Oxygen Re-Evaluation:   Oxygen Discharge (Final Oxygen Re-Evaluation):   Initial Exercise Prescription: Initial Exercise Prescription - 04/27/18 1000    Date of Initial Exercise RX and Referring Provider          Date  04/27/18    Referring Provider  Elouise Munroe, MD    Expected Discharge Date  07/19/18        Recumbant Bike          Level  2    Watts  15    Minutes  10    METs  2.4        NuStep          Level  2    SPM  85    Minutes  10    METs  2.4  Track          Laps  8    Minutes  10    METs  2.39        Prescription Details           Frequency (times per week)  3    Duration  Progress to 30 minutes of continuous aerobic without signs/symptoms of physical distress        Intensity          THRR 40-80% of Max Heartrate  61-122    Ratings of Perceived Exertion  11-13    Perceived Dyspnea  0-4        Progression          Progression  Continue to progress workloads to maintain intensity without signs/symptoms of physical distress.        Resistance Training          Training Prescription  Yes    Weight  3lbs    Reps  10-15           Perform Capillary Blood Glucose checks as needed.  Exercise Prescription Changes:   Exercise Comments:   Exercise Goals and Review:  Exercise Goals    Exercise Goals    Row Name 04/27/18 0747   Increase Physical Activity  Yes   Intervention  Provide advice, education, support and counseling about physical activity/exercise needs.;Develop an individualized exercise prescription for aerobic and resistive training based on initial evaluation findings, risk stratification, comorbidities and participant's personal goals.   Expected Outcomes  Short Term: Attend rehab on a regular basis to increase amount of physical activity.;Long Term: Add in home exercise to make exercise part of routine and to increase amount of physical activity.;Long Term: Exercising regularly at least 3-5 days a week.   Increase Strength and Stamina  Yes   Intervention  Provide advice, education, support and counseling about physical activity/exercise needs.;Develop an individualized exercise prescription for aerobic and resistive training based on initial evaluation findings, risk stratification, comorbidities and participant's personal goals.   Expected Outcomes  Short Term: Increase workloads from initial exercise prescription for resistance, speed, and METs.;Short Term: Perform resistance training exercises routinely during rehab and add in resistance training at home;Long Term: Improve  cardiorespiratory fitness, muscular endurance and strength as measured by increased METs and functional capacity (6MWT)   Able to understand and use rate of perceived exertion (RPE) scale  Yes   Intervention  Provide education and explanation on how to use RPE scale   Expected Outcomes  Short Term: Able to use RPE daily in rehab to express subjective intensity level;Long Term:  Able to use RPE to guide intensity level when exercising independently   Knowledge and understanding of Target Heart Rate Range (THRR)  Yes   Intervention  Provide education and explanation of THRR including how the numbers were predicted and where they are located for reference   Expected Outcomes  Short Term: Able to state/look up THRR;Long Term: Able to use THRR to govern intensity when exercising independently;Short Term: Able to use daily as guideline for intensity in rehab   Able to check pulse independently  Yes   Intervention  Provide education and demonstration on how to check pulse in carotid and radial arteries.;Review the importance of being able to check your own pulse for safety during independent exercise   Expected Outcomes  Short Term: Able to explain why pulse checking is important during independent exercise;Long Term: Able to check pulse independently and accurately  Understanding of Exercise Prescription  Yes   Intervention  Provide education, explanation, and written materials on patient's individual exercise prescription   Expected Outcomes  Short Term: Able to explain program exercise prescription;Long Term: Able to explain home exercise prescription to exercise independently          Exercise Goals Re-Evaluation :   Discharge Exercise Prescription (Final Exercise Prescription Changes):   Nutrition:  Target Goals: Understanding of nutrition guidelines, daily intake of sodium 1500mg , cholesterol 200mg , calories 30% from fat and 7% or less from saturated fats, daily to have 5 or more servings  of fruits and vegetables.  Biometrics: Pre Biometrics - 04/27/18 0740    Pre Biometrics          Height  5' 5.5" (1.664 m)    Weight  118.9 kg    Waist Circumference  46 inches    Hip Circumference  56 inches    Waist to Hip Ratio  0.82 %    BMI (Calculated)  42.94    Triceps Skinfold  55 mm    % Body Fat  54.5 %    Grip Strength  25.5 kg    Flexibility  0 in   leg pain   Single Leg Stand  2.28 seconds            Nutrition Therapy Plan and Nutrition Goals: Nutrition Therapy & Goals - 04/27/18 0844    Nutrition Therapy          Diet  heart healthy        Personal Nutrition Goals          Nutrition Goal  Pt to identify and limit food sources of saturated fat, trans fat, refined carbohydrates and sodium    Personal Goal #2  Pt to identify food quantities necessary to achieve weight loss of 6-24 lb at graduation from cardiac rehab.     Personal Goal #3  Pt to build a healthy plate including vegetables, fruits, whole grains, and low-fat dairy products in a heart healthy meal plan.    Personal Goal #4  Pt to increase and eat variety of non-starchy vegetables.    Additional Goals?  Yes    Personal Goal #5  Pt to go easy on snack chips, crackers, sugar sweetened beverages        Intervention Plan          Intervention  Prescribe, educate and counsel regarding individualized specific dietary modifications aiming towards targeted core components such as weight, hypertension, lipid management, diabetes, heart failure and other comorbidities.    Expected Outcomes  Short Term Goal: Understand basic principles of dietary content, such as calories, fat, sodium, cholesterol and nutrients.;Long Term Goal: Adherence to prescribed nutrition plan.           Nutrition Assessments: Nutrition Assessments - 04/27/18 0847    MEDFICTS Scores          Pre Score  48           Nutrition Goals Re-Evaluation:   Nutrition Goals Re-Evaluation:   Nutrition Goals Discharge (Final  Nutrition Goals Re-Evaluation):   Psychosocial: Target Goals: Acknowledge presence or absence of significant depression and/or stress, maximize coping skills, provide positive support system. Participant is able to verbalize types and ability to use techniques and skills needed for reducing stress and depression.  Initial Review & Psychosocial Screening: Initial Psych Review & Screening - 04/27/18 1122    Initial Review          Current  issues with  None Identified        Family Dynamics          Good Support System?  Yes   my children        Barriers          Psychosocial barriers to participate in program  There are no identifiable barriers or psychosocial needs.        Screening Interventions          Interventions  Encouraged to exercise    Expected Outcomes  Short Term goal: Utilizing psychosocial counselor, staff and physician to assist with identification of specific Stressors or current issues interfering with healing process. Setting desired goal for each stressor or current issue identified.           Quality of Life Scores: Quality of Life - 04/27/18 1030    Quality of Life          Select  Quality of Life        Quality of Life Scores          Health/Function Pre  25.32 %    Socioeconomic Pre  25.67 %    Psych/Spiritual Pre  26.36 %    Family Pre  25.5 %    GLOBAL Pre  25.63 %          Scores of 19 and below usually indicate a poorer quality of life in these areas.  A difference of  2-3 points is a clinically meaningful difference.  A difference of 2-3 points in the total score of the Quality of Life Index has been associated with significant improvement in overall quality of life, self-image, physical symptoms, and general health in studies assessing change in quality of life.  PHQ-9: Recent Review Flowsheet Data    There is no flowsheet data to display.     Interpretation of Total Score  Total Score Depression Severity:  1-4 = Minimal  depression, 5-9 = Mild depression, 10-14 = Moderate depression, 15-19 = Moderately severe depression, 20-27 = Severe depression   Psychosocial Evaluation and Intervention:   Psychosocial Re-Evaluation:   Psychosocial Discharge (Final Psychosocial Re-Evaluation):   Vocational Rehabilitation: Provide vocational rehab assistance to qualifying candidates.   Vocational Rehab Evaluation & Intervention: Vocational Rehab - 04/27/18 1126    Initial Vocational Rehab Evaluation & Intervention          Assessment shows need for Vocational Rehabilitation  No   no plan to return to work          Education: Education Goals: Education classes will be provided on a weekly basis, covering required topics. Participant will state understanding/return demonstration of topics presented.  Learning Barriers/Preferences: Learning Barriers/Preferences - 04/27/18 1034    Learning Barriers/Preferences          Learning Barriers  None    Learning Preferences  Video;Pictoral           Education Topics: Count Your Pulse:  -Group instruction provided by verbal instruction, demonstration, patient participation and written materials to support subject.  Instructors address importance of being able to find your pulse and how to count your pulse when at home without a heart monitor.  Patients get hands on experience counting their pulse with staff help and individually.   Heart Attack, Angina, and Risk Factor Modification:  -Group instruction provided by verbal instruction, video, and written materials to support subject.  Instructors address signs and symptoms of angina and heart attacks.    Also discuss risk  factors for heart disease and how to make changes to improve heart health risk factors.   Functional Fitness:  -Group instruction provided by verbal instruction, demonstration, patient participation, and written materials to support subject.  Instructors address safety measures for doing things  around the house.  Discuss how to get up and down off the floor, how to pick things up properly, how to safely get out of a chair without assistance, and balance training.   Meditation and Mindfulness:  -Group instruction provided by verbal instruction, patient participation, and written materials to support subject.  Instructor addresses importance of mindfulness and meditation practice to help reduce stress and improve awareness.  Instructor also leads participants through a meditation exercise.    Stretching for Flexibility and Mobility:  -Group instruction provided by verbal instruction, patient participation, and written materials to support subject.  Instructors lead participants through series of stretches that are designed to increase flexibility thus improving mobility.  These stretches are additional exercise for major muscle groups that are typically performed during regular warm up and cool down.   Hands Only CPR:  -Group verbal, video, and participation provides a basic overview of AHA guidelines for community CPR. Role-play of emergencies allow participants the opportunity to practice calling for help and chest compression technique with discussion of AED use.   Hypertension: -Group verbal and written instruction that provides a basic overview of hypertension including the most recent diagnostic guidelines, risk factor reduction with self-care instructions and medication management.    Nutrition I class: Heart Healthy Eating:  -Group instruction provided by PowerPoint slides, verbal discussion, and written materials to support subject matter. The instructor gives an explanation and review of the Therapeutic Lifestyle Changes diet recommendations, which includes a discussion on lipid goals, dietary fat, sodium, fiber, plant stanol/sterol esters, sugar, and the components of a well-balanced, healthy diet.   Nutrition II class: Lifestyle Skills:  -Group instruction provided by  PowerPoint slides, verbal discussion, and written materials to support subject matter. The instructor gives an explanation and review of label reading, grocery shopping for heart health, heart healthy recipe modifications, and ways to make healthier choices when eating out.   Diabetes Question & Answer:  -Group instruction provided by PowerPoint slides, verbal discussion, and written materials to support subject matter. The instructor gives an explanation and review of diabetes co-morbidities, pre- and post-prandial blood glucose goals, pre-exercise blood glucose goals, signs, symptoms, and treatment of hypoglycemia and hyperglycemia, and foot care basics.   Diabetes Blitz:  -Group instruction provided by PowerPoint slides, verbal discussion, and written materials to support subject matter. The instructor gives an explanation and review of the physiology behind type 1 and type 2 diabetes, diabetes medications and rational behind using different medications, pre- and post-prandial blood glucose recommendations and Hemoglobin A1c goals, diabetes diet, and exercise including blood glucose guidelines for exercising safely.    Portion Distortion:  -Group instruction provided by PowerPoint slides, verbal discussion, written materials, and food models to support subject matter. The instructor gives an explanation of serving size versus portion size, changes in portions sizes over the last 20 years, and what consists of a serving from each food group.   Stress Management:  -Group instruction provided by verbal instruction, video, and written materials to support subject matter.  Instructors review role of stress in heart disease and how to cope with stress positively.     Exercising on Your Own:  -Group instruction provided by verbal instruction, power point, and written materials to support subject.  Instructors discuss benefits of exercise, components of exercise, frequency and intensity of exercise,  and end points for exercise.  Also discuss use of nitroglycerin and activating EMS.  Review options of places to exercise outside of rehab.  Review guidelines for sex with heart disease.   Cardiac Drugs I:  -Group instruction provided by verbal instruction and written materials to support subject.  Instructor reviews cardiac drug classes: antiplatelets, anticoagulants, beta blockers, and statins.  Instructor discusses reasons, side effects, and lifestyle considerations for each drug class.   Cardiac Drugs II:  -Group instruction provided by verbal instruction and written materials to support subject.  Instructor reviews cardiac drug classes: angiotensin converting enzyme inhibitors (ACE-I), angiotensin II receptor blockers (ARBs), nitrates, and calcium channel blockers.  Instructor discusses reasons, side effects, and lifestyle considerations for each drug class.   Anatomy and Physiology of the Circulatory System:  Group verbal and written instruction and models provide basic cardiac anatomy and physiology, with the coronary electrical and arterial systems. Review of: AMI, Angina, Valve disease, Heart Failure, Peripheral Artery Disease, Cardiac Arrhythmia, Pacemakers, and the ICD.   Other Education:  -Group or individual verbal, written, or video instructions that support the educational goals of the cardiac rehab program.   Holiday Eating Survival Tips:  -Group instruction provided by PowerPoint slides, verbal discussion, and written materials to support subject matter. The instructor gives patients tips, tricks, and techniques to help them not only survive but enjoy the holidays despite the onslaught of food that accompanies the holidays.   Knowledge Questionnaire Score: Knowledge Questionnaire Score - 04/27/18 1029    Knowledge Questionnaire Score          Pre Score  19/24           Core Components/Risk Factors/Patient Goals at Admission: Personal Goals and Risk Factors at  Admission - 04/27/18 0740    Core Components/Risk Factors/Patient Goals on Admission           Weight Management  Obesity;Yes    Intervention  Weight Management: Develop a combined nutrition and exercise program designed to reach desired caloric intake, while maintaining appropriate intake of nutrient and fiber, sodium and fats, and appropriate energy expenditure required for the weight goal.;Weight Management: Provide education and appropriate resources to help participant work on and attain dietary goals.;Weight Management/Obesity: Establish reasonable short term and long term weight goals.;Obesity: Provide education and appropriate resources to help participant work on and attain dietary goals.    Admit Weight  262 lb 2 oz (118.9 kg)    Goal Weight: Short Term  256 lb (116.1 kg)    Goal Weight: Long Term  160 lb (72.6 kg)    Expected Outcomes  Short Term: Continue to assess and modify interventions until short term weight is achieved;Long Term: Adherence to nutrition and physical activity/exercise program aimed toward attainment of established weight goal;Weight Loss: Understanding of general recommendations for a balanced deficit meal plan, which promotes 1-2 lb weight loss per week and includes a negative energy balance of [phone] kcal/d;Understanding recommendations for meals to include 15-35% energy as protein, 25-35% energy from fat, 35-60% energy from carbohydrates, less than 200mg  of dietary cholesterol, 20-35 gm of total fiber daily;Understanding of distribution of calorie intake throughout the day with the consumption of 4-5 meals/snacks    Hypertension  Yes    Intervention  Provide education on lifestyle modifcations including regular physical activity/exercise, weight management, moderate sodium restriction and increased consumption of fresh fruit, vegetables, and low fat dairy, alcohol moderation, and  smoking cessation.;Monitor prescription use compliance.    Expected Outcomes  Short Term:  Continued assessment and intervention until BP is < 140/33mm HG in hypertensive participants. < 130/40mm HG in hypertensive participants with diabetes, heart failure or chronic kidney disease.;Long Term: Maintenance of blood pressure at goal levels.    Lipids  Yes    Intervention  Provide education and support for participant on nutrition & aerobic/resistive exercise along with prescribed medications to achieve LDL 70mg , HDL >40mg .    Expected Outcomes  Short Term: Participant states understanding of desired cholesterol values and is compliant with medications prescribed. Participant is following exercise prescription and nutrition guidelines.;Long Term: Cholesterol controlled with medications as prescribed, with individualized exercise RX and with personalized nutrition plan. Value goals: LDL < 70mg , HDL > 40 mg.           Core Components/Risk Factors/Patient Goals Review:    Core Components/Risk Factors/Patient Goals at Discharge (Final Review):    ITP Comments: ITP Comments    Row Name 04/27/18 0829   ITP Comments  Medical Director- Dr. Fransico Him, MD      Comments: Patient attended orientation from 0740  to [phone]  to review rules and guidelines for program. Completed 6 minute walk test, Intitial ITP, and exercise prescription.  VSS. Telemetry-sinus rhythm. Pt c/o lower back pain with walk test and dizziness with last few laps, symptoms resolved with rest.  Pt given water. Recheck BP: 130/69.   Will continue to monitor the patient throughout  the program.  [name], RN, BSN Cardiac Pulmonary Rehab  04/27/18 12:11 PM

## 2018-04-27 NOTE — Progress Notes (Signed)
Diane Barker 68 y.o. female DOB: 09-24-1950 MRN: [id number]      Nutrition Note  1. Stable angina [facility])    Past Medical History:  Diagnosis Date  . Anxiety   . Arthritis    "all over my body" (01/15/2018)  . Breast cancer, right breast ([place]) 2000   Rt lumpectomy, Chemo/XRT/rt axillary node   . Bronchial asthma   . Chronic lower back pain   . Depression   . Diverticulosis   . DVT (deep venous thrombosis) ([place]) 06/2013   RLE  . GERD (gastroesophageal reflux disease)   . High cholesterol   . Hx of adenomatous colonic polyps   . Hypertension   . Obesity   . Osteoarthritis   . Personal history of chemotherapy   . Personal history of radiation therapy   . Pulmonary embolism ([place]) 06/2013   "both lungs"  . Spondylolisthesis    Meds reviewed.    Current Outpatient Medications (Cardiovascular):  .  atorvastatin (LIPITOR) 40 MG tablet, Take 1 tablet (40 mg total) by mouth daily at 6 PM. .  isosorbide mononitrate (IMDUR) 30 MG 24 hr tablet, Take 1 tablet (30 mg total) by mouth daily. .  nitroGLYCERIN (NITROSTAT) 0.4 MG SL tablet, Place 1 tablet (0.4 mg total) under the tongue every 5 (five) minutes as needed. .  metoprolol succinate (TOPROL-XL) 25 MG 24 hr tablet, Take 1 tablet (25 mg total) by mouth daily. (Patient not taking: Reported on 04/27/2018)  Current Outpatient Medications (Respiratory):  .  PROAIR HFA 108 (90 Base) MCG/ACT inhaler, Inhale 2 puffs into the lungs every 4 (four) hours as needed for wheezing or shortness of breath.   Current Outpatient Medications (Analgesics):  .  acetaminophen (TYLENOL) 500 MG tablet, Take 500 mg by mouth every 6 (six) hours as needed for mild pain. Marland Kitchen  aspirin EC 81 MG EC tablet, Take 1 tablet (81 mg total) by mouth daily.  Current Outpatient Medications (Hematological):  .  vitamin B-12 (CYANOCOBALAMIN) 500 MCG tablet, Take 500 mcg by mouth daily. Alveda Reasons 20 MG TABS tablet, Take 1 tablet by mouth daily.  Current Outpatient Medications  (Other):  Marland Kitchen  Cholecalciferol (VITAMIN D) 2000 UNITS CAPS, Take 4,000 Units by mouth daily.  .  ciclopirox (LOPROX) 0.77 % cream, Apply 1 application topically 2 (two) times daily. Marland Kitchen  dexlansoprazole (DEXILANT) 60 MG capsule, Take 60 mg by mouth daily. Marland Kitchen  escitalopram (LEXAPRO) 10 MG tablet, Take 10 mg by mouth.   HT: Ht Readings from Last 1 Encounters:  04/27/18 5' 5.5" (1.664 m)    WT: Wt Readings from Last 5 Encounters:  04/27/18 262 lb 2 oz (118.9 kg)  04/26/18 264 lb 12.8 oz (120.1 kg)  02/05/18 264 lb 6.4 oz (119.9 kg)  01/19/18 259 lb 6.4 oz (117.7 kg)  01/08/18 262 lb (118.8 kg)     Body mass index is 42.96 kg/m.   Current tobacco use? No  Labs:  Lipid Panel     Component Value Date/Time   CHOL 182 01/16/2018 0238   TRIG 60 01/16/2018 0238   HDL 46 01/16/2018 0238   CHOLHDL 4.0 01/16/2018 0238   VLDL 12 01/16/2018 0238   LDLCALC 124 (H) 01/16/2018 0238    Lab Results  Component Value Date   HGBA1C 5.3 01/16/2018   CBG (last 3)  No results for input(s): GLUCAP in the last 72 hours.  Nutrition Note Spoke with pt. Nutrition plan and goals reviewed with pt. Pt is following Step  1 of the Therapeutic Lifestyle Changes diet. Pt wants to lose wt. Pt has not currently been trying to lose wt. Wt loss tips reviewed (label reading, how to build a healthy plate, portion sizes, eating frequently across the day). Pt insisted she won't cook, doesn't "eat anything green", and "only eats snacks". Pt shared that snacks for her constitute pizza, mac n' cheese, pimento cheese sandwiches, candy, caffeine free pepsi and sleeves of crackers. Set goal with pt to build a healthy plate, set intentional eating schedule, increase non-starchy vegetables, decrease sugar sweetened beverages. Pt highly resistant to dietitian suggestions today, most likely in a contemplation stage. Will continue to use education and motivational interviewing to highlight difference between pt goals and current  behaviors.  Per discussion, pt does use canned/convenience foods often. Pt does not add salt to food. Pt does eat out frequently. Pt expressed understanding of the information reviewed. Pt aware of nutrition education classes offered and would like to attend nutrition classes.  Nutrition Diagnosis ? Food-and nutrition-related knowledge deficit related to lack of exposure to information as related to diagnosis of: ? CVD  ? Obese  III = 40+ related to excessive energy intake as evidenced by a Body mass index is 42.96 kg/m.  Nutrition Intervention ? Pt's individual nutrition plan and goals reviewed with pt. ? Pt given handouts for: ? Nutrition I class ? Nutrition II class   Nutrition Goal(s):  ? Pt to identify and limit food sources of saturated fat, trans fat, refined carbohydrates and sodium ? Pt to identify food quantities necessary to achieve weight loss of 6-24 lb at graduation from cardiac rehab.  ? Pt to build a healthy plate including vegetables, fruits, whole grains, and low-fat dairy products in a heart healthy meal plan. ? Pt to increase and eat variety of non-starchy vegetables. ? Pt to go easy on snack chips, crackers, sugar sweetened beverages  Plan:  ? Pt to attend nutrition classes:  ? Nutrition I ? Nutrition II ? Portion Distortion  ? Will provide client-centered nutrition education as part of interdisciplinary care ? Monitor and evaluate progress toward nutrition goal with team.   Laurina Bustle, MS, RD, LDN 04/28/2018 8:04 AM

## 2018-05-03 ENCOUNTER — Encounter (HOSPITAL_COMMUNITY): Payer: Self-pay

## 2018-05-03 ENCOUNTER — Encounter (HOSPITAL_COMMUNITY)
Admission: RE | Admit: 2018-05-03 | Discharge: 2018-05-03 | Disposition: A | Discharge status: Home / Self Care | Payer: 59 | Source: Ambulatory Visit | Attending: Internal Medicine | Admitting: Internal Medicine

## 2018-05-03 DIAGNOSIS — I208 Other forms of angina pectoris: Secondary | ICD-10-CM

## 2018-05-03 NOTE — Progress Notes (Signed)
Daily Session Note  Patient Details  Name: Diane Barker MRN: [id number] Date of Birth: May 27, 1950 Referring Provider:   Flowsheet Row CARDIAC REHAB PHASE II ORIENTATION from 04/27/2018 in [place]  Referring Provider  Elouise Munroe, MD      Encounter Date: 05/03/2018  Check In: Session Check In - 05/03/18 1410    Check-In          Supervising physician immediately available to respond to emergencies  [facility] immediately available    Physician(s)  Dr Tawanna Solo    Location  [facility]    Staff Present  Barnet Pall, RN, BSN;Joann Rion, RN, BSN;Brittany Vance, BS, ACSM CEP, Exercise Physiologist    Medication changes reported      No    Fall or balance concerns reported     No    Tobacco Cessation  No Change    Warm-up and Cool-down  Performed as group-led instruction    Resistance Training Performed  Yes    VAD Patient?  No    PAD/SET Patient?  No        Pain Assessment          Currently in Pain?  No/denies           Capillary Blood Glucose: No results found for this or any previous visit (from the past 24 hour(s)).    Social History   Tobacco Use  Smoking Status Never Smoker  Smokeless Tobacco Never Used    Goals Met:  Exercise tolerated well  Goals Unmet:  Not Applicable  Comments: Pt started cardiac rehab today.  Pt tolerated light exercise without difficulty. VSS, telemetry-sinus rhythm, asymptomatic.  Medication list reconciled. Pt denies barriers to medicaiton compliance.  PSYCHOSOCIAL ASSESSMENT:  PHQ-0. Pt exhibits positive coping skills, hopeful outlook with supportive family. No psychosocial needs identified at this time, no psychosocial interventions necessary.  Pt oriented to exercise equipment and routine.    Understanding verbalized. [name], RN, BSN Cardiac Pulmonary Rehab    Dr. Fransico Him is Medical Director for Cardiac Rehab at [facility].

## 2018-05-04 ENCOUNTER — Telehealth: Payer: Self-pay

## 2018-05-04 NOTE — Telephone Encounter (Signed)
Error

## 2018-05-05 ENCOUNTER — Encounter (HOSPITAL_COMMUNITY)
Admission: RE | Admit: 2018-05-05 | Discharge: 2018-05-05 | Disposition: A | Discharge status: Home / Self Care | Payer: 59 | Source: Ambulatory Visit | Attending: Internal Medicine | Admitting: Internal Medicine

## 2018-05-05 DIAGNOSIS — I208 Other forms of angina pectoris: Secondary | ICD-10-CM | POA: Diagnosis not present

## 2018-05-05 NOTE — Progress Notes (Signed)
Diane Barker 68 y.o. female Nutrition Note Spoke with pt. Nutrition plan and goals reviewed with pt. Pt is following heart healthy diet. Pt wants to lose weight, but she is not willing to cook and resistant to making changes. Will continue to use motivational interviewing/ education to highlight discrepancy between pts goals and current behaviors. Wt loss tips reviewed (label reading, how to build a healthy plate, portion sizes, eating frequently across the day, weighing and measuring for accuracy, planning ahead). Pt reiterated she won't cook, doesn't "eat anything green", and "only eats snacks". Pt shared that snacks for her constitute pizza, mac n' cheese, pimento cheese sandwiches, candy, caffeine free pepsi and sleeves of crackers. Since orientation pt has stopped consuming sugar sweetened beverages, praised pt for this change and continue to encourage positive behavioral changes. Pt aware of nutrition education classes offered and would like to attend nutrition classes.  Lab Results  Component Value Date   HGBA1C 5.3 01/16/2018    Wt Readings from Last 3 Encounters:  04/27/18 262 lb 2 oz (118.9 kg)  04/26/18 264 lb 12.8 oz (120.1 kg)  02/05/18 264 lb 6.4 oz (119.9 kg)   Nutrition Diagnosis ? Food-and nutrition-related knowledge deficit related to lack of exposure to information as related to diagnosis of: ? CVD  ? Obese  III = 40+ related to excessive energy intake as evidenced by a Body mass index is 42.96 kg/m.  Nutrition Intervention ? Pt's individual nutrition plan and goals reviewed with pt.  Nutrition Goal(s):  ? Pt to identify and limit food sources of saturated fat, trans fat, refined carbohydrates and sodium ? Pt to identify food quantities necessary to achieve weight loss of 6-24 lb at graduation from cardiac rehab.  ? Pt to build a healthy plate including vegetables, fruits, whole grains, and low-fat dairy products in a heart healthy meal plan. ? Pt to increase and eat  variety of non-starchy vegetables. ? Pt to go easy on snack chips, crackers, sugar sweetened beverages  Plan:  ? Pt to attend nutrition classes:  ? Nutrition I ? Nutrition II ? Portion Distortion  ? Will provide client-centered nutrition education as part of interdisciplinary care ? Monitor and evaluate progress toward nutrition goal with team.  Laurina Bustle, MS, RD, LDN 05/05/2018 2:03 PM

## 2018-05-07 ENCOUNTER — Telehealth (HOSPITAL_COMMUNITY): Payer: Self-pay | Admitting: Internal Medicine

## 2018-05-07 ENCOUNTER — Encounter (HOSPITAL_COMMUNITY): Payer: 59

## 2018-05-10 ENCOUNTER — Telehealth: Payer: Self-pay | Admitting: Internal Medicine

## 2018-05-10 ENCOUNTER — Telehealth (HOSPITAL_COMMUNITY): Payer: Self-pay | Admitting: Internal Medicine

## 2018-05-10 ENCOUNTER — Encounter (HOSPITAL_COMMUNITY): Payer: 59

## 2018-05-10 NOTE — Telephone Encounter (Signed)
Noted. Update forwarded to Dr. Margaretann Loveless.

## 2018-05-10 NOTE — Telephone Encounter (Signed)
Patient is calling per instructed to let provider know how she is doing on the metoprolol succinate (TOPROL-XL) 25 MG 24 hr tablet. She states she is doing fine on the medication, she feels she has more energy now, and feels she has better appetite now, and is not as tired, and not sleep all the time like before.

## 2018-05-11 NOTE — Telephone Encounter (Signed)
Called pt to give to let her know that Dr.Acharya has been updated. Per Dr.Acharya she is to discontinue Metoprolol. Lmom. Patient sts her name on the voicemail. Pt med list updated. Pt is to contact the office if any questions.

## 2018-05-11 NOTE — Telephone Encounter (Signed)
Ok to stop metoprolol for symptoms of depression limiting lifestyle.  Beta blocker indicated for medical management of CAD, however patient feels better off of metoprolol. Can consider alternate therapy for med mgmt of CAD.

## 2018-05-12 ENCOUNTER — Telehealth (HOSPITAL_COMMUNITY): Payer: Self-pay | Admitting: Cardiac Rehabilitation

## 2018-05-12 ENCOUNTER — Encounter (HOSPITAL_COMMUNITY): Payer: 59

## 2018-05-12 NOTE — Telephone Encounter (Signed)
Phone call to pt to discuss recent absence from cardiac rehab.  Pt is taking 2 weeks off from rehab.  She twisted her ankle at home leading to knee and back pain. Pt has not been evaluated by MD.  Pt instructed to contact PCP if symptoms unimproved or worsen.  Understanding verbalized.  [name], RN, BSN Cardiac Pulmonary Rehab

## 2018-05-14 ENCOUNTER — Encounter (HOSPITAL_COMMUNITY): Payer: 59

## 2018-05-17 ENCOUNTER — Encounter (HOSPITAL_COMMUNITY): Payer: 59

## 2018-05-19 ENCOUNTER — Encounter (HOSPITAL_COMMUNITY): Payer: 59

## 2018-05-19 NOTE — Progress Notes (Signed)
Cardiac Individual Treatment Plan  Patient Details  Name: Diane Barker MRN: [id number] Date of Birth: 1950-05-15 Referring Provider:   Flowsheet Row CARDIAC REHAB PHASE II ORIENTATION from 04/27/2018 in [facility]  Referring Provider  Elouise Munroe, MD      Initial Encounter Date:  [place] PHASE II ORIENTATION from 04/27/2018 in [facility]  Date  04/27/18      Visit Diagnosis: Stable angina ([place])  Patient's Home Medications on Admission:  Current Outpatient Medications:  .  acetaminophen (TYLENOL) 500 MG tablet, Take 500 mg by mouth every 6 (six) hours as needed for mild pain., Disp: , Rfl:  .  aspirin EC 81 MG EC tablet, Take 1 tablet (81 mg total) by mouth daily., Disp: 30 tablet, Rfl: 0 .  atorvastatin (LIPITOR) 40 MG tablet, Take 1 tablet (40 mg total) by mouth daily at 6 PM., Disp: 90 tablet, Rfl: 3 .  Cholecalciferol (VITAMIN D) 2000 UNITS CAPS, Take 4,000 Units by mouth daily. , Disp: , Rfl:  .  ciclopirox (LOPROX) 0.77 % cream, Apply 1 application topically 2 (two) times daily., Disp: , Rfl:  .  dexlansoprazole (DEXILANT) 60 MG capsule, Take 60 mg by mouth daily., Disp: , Rfl:  .  escitalopram (LEXAPRO) 10 MG tablet, Take 10 mg by mouth., Disp: , Rfl:  .  isosorbide mononitrate (IMDUR) 30 MG 24 hr tablet, Take 1 tablet (30 mg total) by mouth daily., Disp: 90 tablet, Rfl: 3 .  nitroGLYCERIN (NITROSTAT) 0.4 MG SL tablet, Place 1 tablet (0.4 mg total) under the tongue every 5 (five) minutes as needed., Disp: 25 tablet, Rfl: 3 .  PROAIR HFA 108 (90 Base) MCG/ACT inhaler, Inhale 2 puffs into the lungs every 4 (four) hours as needed for wheezing or shortness of breath. , Disp: , Rfl: 2 .  vitamin B-12 (CYANOCOBALAMIN) 500 MCG tablet, Take 500 mcg by mouth daily., Disp: , Rfl:  .  XARELTO 20 MG TABS tablet, Take 1 tablet by mouth daily., Disp: , Rfl: 5  Past Medical History: Past Medical History:   Diagnosis Date  . Anxiety   . Arthritis    "all over my body" (01/15/2018)  . Breast cancer, right breast ([place]) 2000   Rt lumpectomy, Chemo/XRT/rt axillary node   . Bronchial asthma   . Chronic lower back pain   . Depression   . Diverticulosis   . DVT (deep venous thrombosis) ([place]) 06/2013   RLE  . GERD (gastroesophageal reflux disease)   . High cholesterol   . Hx of adenomatous colonic polyps   . Hypertension   . Obesity   . Osteoarthritis   . Personal history of chemotherapy   . Personal history of radiation therapy   . Pulmonary embolism ([place]) 06/2013   "both lungs"  . Spondylolisthesis     Tobacco Use: Social History   Tobacco Use  Smoking Status Never Smoker  Smokeless Tobacco Never Used    Labs: Recent Review Flowsheet Data    Labs for ITP Cardiac and Pulmonary Rehab Latest Ref Rng & Units 12/18/2006 02/03/2008 02/04/2008 03/23/2015 01/16/2018   Cholestrol 0 - 200 mg/dL - - 192 ATP III CLASSIFICATION: <200     mg/dL   Desirable 200-239  mg/dL   Borderline High >=240    mg/dL   High - 182   LDLCALC 0 - 99 mg/dL - - 130 Total Cholesterol/HDL:CHD Risk Coronary Heart Disease Risk Table Men  Women 1/2 Average Risk   3.4   3.3(H) - 124(H)   HDL >40 mg/dL - - 48 - 46   Trlycerides <150 mg/dL - - 70 - 60   Hemoglobin A1c 4.8 - 5.6 % 5.5 5.7 (NOTE)   The ADA recommends the following therapeutic goal for glycemic   control related to Hgb A1C measurement:   Goal of Therapy:   < 7.0% Hgb A1C   Reference: American Diabetes Association: Clinical Practice   Recommendations 2008, Diabetes Care, 2008, 31:(Suppl 1). - 5.8(H) 5.3      Capillary Blood Glucose: No results found for: GLUCAP   Exercise Target Goals: Exercise Program Goal: Individual exercise prescription set using results from initial 6 min walk test and THRR while considering  patient's activity barriers and safety.   Exercise Prescription Goal: Initial exercise prescription builds to 30-45 minutes a day  of aerobic activity, 2-3 days per week.  Home exercise guidelines will be given to patient during program as part of exercise prescription that the participant will acknowledge.  Activity Barriers & Risk Stratification: Activity Barriers & Cardiac Risk Stratification - 04/27/18 0747    Activity Barriers & Cardiac Risk Stratification          Activity Barriers  Arthritis;Other (comment)    Comments  OA- low back pain    Cardiac Risk Stratification  High           6 Minute Walk: 6 Minute Walk    6 Minute Walk    Row Name 04/27/18 0804   Phase  Initial   Distance  1459 feet   Walk Time  6 minutes   # of Rest Breaks  0   MPH  2.76   METS  2.39   RPE  12   Perceived Dyspnea   0   VO2 Peak  8.36   Symptoms  Yes (comment)   Comments  Patient c/o low back pain, "3-4" out of 10 on the pain scale. Pt also states she was dizzy/lightheaded the last couple of laps of the walk test. Resolved with rest.   Resting HR  73 bpm   Resting BP  125/69   Resting Oxygen Saturation   97 %   Exercise Oxygen Saturation  during 6 min walk  97 %   Max Ex. HR  104 bpm   Max Ex. BP  124/84   2 Minute Post BP  130/69          Oxygen Initial Assessment:   Oxygen Re-Evaluation:   Oxygen Discharge (Final Oxygen Re-Evaluation):   Initial Exercise Prescription: Initial Exercise Prescription - 04/27/18 1000    Date of Initial Exercise RX and Referring Provider          Date  04/27/18    Referring Provider  Elouise Munroe, MD    Expected Discharge Date  07/19/18        Recumbant Bike          Level  2    Watts  15    Minutes  10    METs  2.4        NuStep          Level  2    SPM  85    Minutes  10    METs  2.4        Track          Laps  8    Minutes  10    METs  2.39  Prescription Details          Frequency (times per week)  3    Duration  Progress to 30 minutes of continuous aerobic without signs/symptoms of physical distress        Intensity           THRR 40-80% of Max Heartrate  61-122    Ratings of Perceived Exertion  11-13    Perceived Dyspnea  0-4        Progression          Progression  Continue to progress workloads to maintain intensity without signs/symptoms of physical distress.        Resistance Training          Training Prescription  Yes    Weight  3lbs    Reps  10-15           Perform Capillary Blood Glucose checks as needed.  Exercise Prescription Changes: Exercise Prescription Changes    Response to Exercise    Row Name 05/03/18 1500   Blood Pressure (Admit)  140/68   Blood Pressure (Exercise)  134/64   Blood Pressure (Exit)  112/70   Heart Rate (Admit)  84 bpm   Heart Rate (Exercise)  108 bpm   Heart Rate (Exit)  81 bpm   Rating of Perceived Exertion (Exercise)  15   Symptoms  None   Comments  Pt first day of exercise.    Duration  Continue with 30 min of aerobic exercise without signs/symptoms of physical distress.   Intensity  THRR unchanged       Progression    Row Name 05/03/18 1500   Progression  Continue to progress workloads to maintain intensity without signs/symptoms of physical distress.   Average METs  2.1       Resistance Training    Row Name 05/03/18 1500   Training Prescription  Yes   Weight  3lbs   Reps  10-15   Time  10 Minutes       [place] Name 05/03/18 1500   Level  2   Watts  15   Minutes  10   METs  2.4       NuStep    Row Name 05/03/18 1500   Level  2   SPM  85   Minutes  10   METs  1.8       Track    Row Name 05/03/18 1500   Laps  8   Minutes  10   METs  2.39          Exercise Comments: Exercise Comments    Row Name 05/03/18 1505   Exercise Comments  Pt first day of exercise. Pt tolerated exercise well.       Exercise Goals and Review: Exercise Goals    Exercise Goals    Row Name 04/27/18 0747   Increase Physical Activity  Yes   Intervention  Provide advice, education, support and counseling about physical  activity/exercise needs.;Develop an individualized exercise prescription for aerobic and resistive training based on initial evaluation findings, risk stratification, comorbidities and participant's personal goals.   Expected Outcomes  Short Term: Attend rehab on a regular basis to increase amount of physical activity.;Long Term: Add in home exercise to make exercise part of routine and to increase amount of physical activity.;Long Term: Exercising regularly at least 3-5 days a week.   Increase Strength and Stamina  Yes   Intervention  Provide advice,  education, support and counseling about physical activity/exercise needs.;Develop an individualized exercise prescription for aerobic and resistive training based on initial evaluation findings, risk stratification, comorbidities and participant's personal goals.   Expected Outcomes  Short Term: Increase workloads from initial exercise prescription for resistance, speed, and METs.;Short Term: Perform resistance training exercises routinely during rehab and add in resistance training at home;Long Term: Improve cardiorespiratory fitness, muscular endurance and strength as measured by increased METs and functional capacity (6MWT)   Able to understand and use rate of perceived exertion (RPE) scale  Yes   Intervention  Provide education and explanation on how to use RPE scale   Expected Outcomes  Short Term: Able to use RPE daily in rehab to express subjective intensity level;Long Term:  Able to use RPE to guide intensity level when exercising independently   Knowledge and understanding of Target Heart Rate Range (THRR)  Yes   Intervention  Provide education and explanation of THRR including how the numbers were predicted and where they are located for reference   Expected Outcomes  Short Term: Able to state/look up THRR;Long Term: Able to use THRR to govern intensity when exercising independently;Short Term: Able to use daily as guideline for intensity in rehab    Able to check pulse independently  Yes   Intervention  Provide education and demonstration on how to check pulse in carotid and radial arteries.;Review the importance of being able to check your own pulse for safety during independent exercise   Expected Outcomes  Short Term: Able to explain why pulse checking is important during independent exercise;Long Term: Able to check pulse independently and accurately   Understanding of Exercise Prescription  Yes   Intervention  Provide education, explanation, and written materials on patient's individual exercise prescription   Expected Outcomes  Short Term: Able to explain program exercise prescription;Long Term: Able to explain home exercise prescription to exercise independently          Exercise Goals Re-Evaluation : Exercise Goals Re-Evaluation    Exercise Goal Re-Evaluation    Row Name 05/03/18 1503   Exercise Goals Review  Increase Physical Activity;Increase Strength and Stamina;Able to understand and use rate of perceived exertion (RPE) scale;Knowledge and understanding of Target Heart Rate Range (THRR);Understanding of Exercise Prescription   Comments  Pt first day of exercise. Pt tolerated exercise Rx well. Encouraged Pt to walk at home in addition to Cardiac Rehab.    Expected Outcomes  Will continue to monitor and progress Pt as tolerated.           Discharge Exercise Prescription (Final Exercise Prescription Changes): Exercise Prescription Changes - 05/03/18 1500    Response to Exercise          Blood Pressure (Admit)  140/68    Blood Pressure (Exercise)  134/64    Blood Pressure (Exit)  112/70    Heart Rate (Admit)  84 bpm    Heart Rate (Exercise)  108 bpm    Heart Rate (Exit)  81 bpm    Rating of Perceived Exertion (Exercise)  15    Symptoms  None    Comments  Pt first day of exercise.     Duration  Continue with 30 min of aerobic exercise without signs/symptoms of physical distress.    Intensity  THRR unchanged         Progression          Progression  Continue to progress workloads to maintain intensity without signs/symptoms of physical distress.    Average  METs  2.1        Resistance Training          Training Prescription  Yes    Weight  3lbs    Reps  10-15    Time  10 Minutes        Recumbant Bike          Level  2    Watts  15    Minutes  10    METs  2.4        NuStep          Level  2    SPM  85    Minutes  10    METs  1.8        Track          Laps  8    Minutes  10    METs  2.39           Nutrition:  Target Goals: Understanding of nutrition guidelines, daily intake of sodium 1500mg , cholesterol 200mg , calories 30% from fat and 7% or less from saturated fats, daily to have 5 or more servings of fruits and vegetables.  Biometrics: Pre Biometrics - 04/27/18 0740    Pre Biometrics          Height  5' 5.5" (1.664 m)    Weight  118.9 kg    Waist Circumference  46 inches    Hip Circumference  56 inches    Waist to Hip Ratio  0.82 %    BMI (Calculated)  42.94    Triceps Skinfold  55 mm    % Body Fat  54.5 %    Grip Strength  25.5 kg    Flexibility  0 in   leg pain   Single Leg Stand  2.28 seconds            Nutrition Therapy Plan and Nutrition Goals: Nutrition Therapy & Goals - 04/27/18 0844    Nutrition Therapy          Diet  heart healthy        Personal Nutrition Goals          Nutrition Goal  Pt to identify and limit food sources of saturated fat, trans fat, refined carbohydrates and sodium    Personal Goal #2  Pt to identify food quantities necessary to achieve weight loss of 6-24 lb at graduation from cardiac rehab.     Personal Goal #3  Pt to build a healthy plate including vegetables, fruits, whole grains, and low-fat dairy products in a heart healthy meal plan.    Personal Goal #4  Pt to increase and eat variety of non-starchy vegetables.    Additional Goals?  Yes    Personal Goal #5  Pt to go easy on snack chips, crackers, sugar  sweetened beverages        Intervention Plan          Intervention  Prescribe, educate and counsel regarding individualized specific dietary modifications aiming towards targeted core components such as weight, hypertension, lipid management, diabetes, heart failure and other comorbidities.    Expected Outcomes  Short Term Goal: Understand basic principles of dietary content, such as calories, fat, sodium, cholesterol and nutrients.;Long Term Goal: Adherence to prescribed nutrition plan.           Nutrition Assessments: Nutrition Assessments - 04/27/18 0847    MEDFICTS Scores          Pre Score  48  Nutrition Goals Re-Evaluation:   Nutrition Goals Re-Evaluation:   Nutrition Goals Discharge (Final Nutrition Goals Re-Evaluation):   Psychosocial: Target Goals: Acknowledge presence or absence of significant depression and/or stress, maximize coping skills, provide positive support system. Participant is able to verbalize types and ability to use techniques and skills needed for reducing stress and depression.  Initial Review & Psychosocial Screening: Initial Psych Review & Screening - 04/27/18 1122    Initial Review          Current issues with  None Identified        Family Dynamics          Good Support System?  Yes   my children        Barriers          Psychosocial barriers to participate in program  There are no identifiable barriers or psychosocial needs.        Screening Interventions          Interventions  Encouraged to exercise    Expected Outcomes  Short Term goal: Utilizing psychosocial counselor, staff and physician to assist with identification of specific Stressors or current issues interfering with healing process. Setting desired goal for each stressor or current issue identified.           Quality of Life Scores: Quality of Life - 04/27/18 1030    Quality of Life          Select  Quality of Life        Quality of Life Scores           Health/Function Pre  25.32 %    Socioeconomic Pre  25.67 %    Psych/Spiritual Pre  26.36 %    Family Pre  25.5 %    GLOBAL Pre  25.63 %          Scores of 19 and below usually indicate a poorer quality of life in these areas.  A difference of  2-3 points is a clinically meaningful difference.  A difference of 2-3 points in the total score of the Quality of Life Index has been associated with significant improvement in overall quality of life, self-image, physical symptoms, and general health in studies assessing change in quality of life.  PHQ-9: Recent Review Flowsheet Data    Depression screen [facility] 2/9 05/03/2018   Decreased Interest 0   Down, Depressed, Hopeless 0   PHQ - 2 Score 0     Interpretation of Total Score  Total Score Depression Severity:  1-4 = Minimal depression, 5-9 = Mild depression, 10-14 = Moderate depression, 15-19 = Moderately severe depression, 20-27 = Severe depression   Psychosocial Evaluation and Intervention: Psychosocial Evaluation - 05/03/18 1419    Psychosocial Evaluation & Interventions          Interventions  Encouraged to exercise with the program and follow exercise prescription    Comments  no psychosocial needs identified, no interventions necessary. pt enjoys attending live auctions with her daughter.      Expected Outcomes  pt will exhibit positive outlook with good coping skills.     Continue Psychosocial Services   No Follow up required           Psychosocial Re-Evaluation:   Psychosocial Discharge (Final Psychosocial Re-Evaluation):   Vocational Rehabilitation: Provide vocational rehab assistance to qualifying candidates.   Vocational Rehab Evaluation & Intervention: Vocational Rehab - 04/27/18 1126    Initial Vocational Rehab Evaluation & Intervention  Assessment shows need for Vocational Rehabilitation  No   no plan to return to work          Education: Education Goals: Education classes will be  provided on a weekly basis, covering required topics. Participant will state understanding/return demonstration of topics presented.  Learning Barriers/Preferences: Learning Barriers/Preferences - 04/27/18 1034    Learning Barriers/Preferences          Learning Barriers  None    Learning Preferences  Video;Pictoral           Education Topics: Count Your Pulse:  -Group instruction provided by verbal instruction, demonstration, patient participation and written materials to support subject.  Instructors address importance of being able to find your pulse and how to count your pulse when at home without a heart monitor.  Patients get hands on experience counting their pulse with staff help and individually.   Heart Attack, Angina, and Risk Factor Modification:  -Group instruction provided by verbal instruction, video, and written materials to support subject.  Instructors address signs and symptoms of angina and heart attacks.    Also discuss risk factors for heart disease and how to make changes to improve heart health risk factors.   Functional Fitness:  -Group instruction provided by verbal instruction, demonstration, patient participation, and written materials to support subject.  Instructors address safety measures for doing things around the house.  Discuss how to get up and down off the floor, how to pick things up properly, how to safely get out of a chair without assistance, and balance training.   Meditation and Mindfulness:  -Group instruction provided by verbal instruction, patient participation, and written materials to support subject.  Instructor addresses importance of mindfulness and meditation practice to help reduce stress and improve awareness.  Instructor also leads participants through a meditation exercise.    Stretching for Flexibility and Mobility:  -Group instruction provided by verbal instruction, patient participation, and written materials to support subject.   Instructors lead participants through series of stretches that are designed to increase flexibility thus improving mobility.  These stretches are additional exercise for major muscle groups that are typically performed during regular warm up and cool down.   Hands Only CPR:  -Group verbal, video, and participation provides a basic overview of AHA guidelines for community CPR. Role-play of emergencies allow participants the opportunity to practice calling for help and chest compression technique with discussion of AED use.   Hypertension: -Group verbal and written instruction that provides a basic overview of hypertension including the most recent diagnostic guidelines, risk factor reduction with self-care instructions and medication management.    Nutrition I class: Heart Healthy Eating:  -Group instruction provided by PowerPoint slides, verbal discussion, and written materials to support subject matter. The instructor gives an explanation and review of the Therapeutic Lifestyle Changes diet recommendations, which includes a discussion on lipid goals, dietary fat, sodium, fiber, plant stanol/sterol esters, sugar, and the components of a well-balanced, healthy diet.   Nutrition II class: Lifestyle Skills:  -Group instruction provided by PowerPoint slides, verbal discussion, and written materials to support subject matter. The instructor gives an explanation and review of label reading, grocery shopping for heart health, heart healthy recipe modifications, and ways to make healthier choices when eating out.   Diabetes Question & Answer:  -Group instruction provided by PowerPoint slides, verbal discussion, and written materials to support subject matter. The instructor gives an explanation and review of diabetes co-morbidities, pre- and post-prandial blood glucose goals, pre-exercise blood glucose  goals, signs, symptoms, and treatment of hypoglycemia and hyperglycemia, and foot care  basics.   Diabetes Blitz:  -Group instruction provided by PowerPoint slides, verbal discussion, and written materials to support subject matter. The instructor gives an explanation and review of the physiology behind type 1 and type 2 diabetes, diabetes medications and rational behind using different medications, pre- and post-prandial blood glucose recommendations and Hemoglobin A1c goals, diabetes diet, and exercise including blood glucose guidelines for exercising safely.    Portion Distortion:  -Group instruction provided by PowerPoint slides, verbal discussion, written materials, and food models to support subject matter. The instructor gives an explanation of serving size versus portion size, changes in portions sizes over the last 20 years, and what consists of a serving from each food group.   Stress Management:  -Group instruction provided by verbal instruction, video, and written materials to support subject matter.  Instructors review role of stress in heart disease and how to cope with stress positively.     Exercising on Your Own:  -Group instruction provided by verbal instruction, power point, and written materials to support subject.  Instructors discuss benefits of exercise, components of exercise, frequency and intensity of exercise, and end points for exercise.  Also discuss use of nitroglycerin and activating EMS.  Review options of places to exercise outside of rehab.  Review guidelines for sex with heart disease.   Cardiac Drugs I:  -Group instruction provided by verbal instruction and written materials to support subject.  Instructor reviews cardiac drug classes: antiplatelets, anticoagulants, beta blockers, and statins.  Instructor discusses reasons, side effects, and lifestyle considerations for each drug class.   Cardiac Drugs II:  -Group instruction provided by verbal instruction and written materials to support subject.  Instructor reviews cardiac drug classes:  angiotensin converting enzyme inhibitors (ACE-I), angiotensin II receptor blockers (ARBs), nitrates, and calcium channel blockers.  Instructor discusses reasons, side effects, and lifestyle considerations for each drug class.   Anatomy and Physiology of the Circulatory System:  Group verbal and written instruction and models provide basic cardiac anatomy and physiology, with the coronary electrical and arterial systems. Review of: AMI, Angina, Valve disease, Heart Failure, Peripheral Artery Disease, Cardiac Arrhythmia, Pacemakers, and the ICD.   Other Education:  -Group or individual verbal, written, or video instructions that support the educational goals of the cardiac rehab program.   Holiday Eating Survival Tips:  -Group instruction provided by PowerPoint slides, verbal discussion, and written materials to support subject matter. The instructor gives patients tips, tricks, and techniques to help them not only survive but enjoy the holidays despite the onslaught of food that accompanies the holidays.   Knowledge Questionnaire Score: Knowledge Questionnaire Score - 04/27/18 1029    Knowledge Questionnaire Score          Pre Score  19/24           Core Components/Risk Factors/Patient Goals at Admission: Personal Goals and Risk Factors at Admission - 04/27/18 0740    Core Components/Risk Factors/Patient Goals on Admission           Weight Management  Obesity;Yes    Intervention  Weight Management: Develop a combined nutrition and exercise program designed to reach desired caloric intake, while maintaining appropriate intake of nutrient and fiber, sodium and fats, and appropriate energy expenditure required for the weight goal.;Weight Management: Provide education and appropriate resources to help participant work on and attain dietary goals.;Weight Management/Obesity: Establish reasonable short term and long term weight goals.;Obesity: Provide education and appropriate  resources to help  participant work on and attain dietary goals.    Admit Weight  262 lb 2 oz (118.9 kg)    Goal Weight: Short Term  256 lb (116.1 kg)    Goal Weight: Long Term  160 lb (72.6 kg)    Expected Outcomes  Short Term: Continue to assess and modify interventions until short term weight is achieved;Long Term: Adherence to nutrition and physical activity/exercise program aimed toward attainment of established weight goal;Weight Loss: Understanding of general recommendations for a balanced deficit meal plan, which promotes 1-2 lb weight loss per week and includes a negative energy balance of [phone] kcal/d;Understanding recommendations for meals to include 15-35% energy as protein, 25-35% energy from fat, 35-60% energy from carbohydrates, less than 200mg  of dietary cholesterol, 20-35 gm of total fiber daily;Understanding of distribution of calorie intake throughout the day with the consumption of 4-5 meals/snacks    Hypertension  Yes    Intervention  Provide education on lifestyle modifcations including regular physical activity/exercise, weight management, moderate sodium restriction and increased consumption of fresh fruit, vegetables, and low fat dairy, alcohol moderation, and smoking cessation.;Monitor prescription use compliance.    Expected Outcomes  Short Term: Continued assessment and intervention until BP is < 140/54mm HG in hypertensive participants. < 130/6mm HG in hypertensive participants with diabetes, heart failure or chronic kidney disease.;Long Term: Maintenance of blood pressure at goal levels.    Lipids  Yes    Intervention  Provide education and support for participant on nutrition & aerobic/resistive exercise along with prescribed medications to achieve LDL 70mg , HDL >40mg .    Expected Outcomes  Short Term: Participant states understanding of desired cholesterol values and is compliant with medications prescribed. Participant is following exercise prescription and nutrition guidelines.;Long  Term: Cholesterol controlled with medications as prescribed, with individualized exercise RX and with personalized nutrition plan. Value goals: LDL < 70mg , HDL > 40 mg.           Core Components/Risk Factors/Patient Goals Review:  Goals and Risk Factor Review    Core Components/Risk Factors/Patient Goals Review    Row Name 05/03/18 1420 05/19/18 1817   Personal Goals Review  Weight Management/Obesity;Hypertension;Lipids  Weight Management/Obesity;Hypertension;Lipids   Review  pt with CAD RF demonstrates eagernss to participate in CR program. pt personal goals are to lose weight, increase strength/stamina to gain increased energy and feel better.   currently out on medical hold.    Expected Outcomes  pt will particiapte in CR exercise, nutrition and lifestyle modification opportunities.   no documentation          Core Components/Risk Factors/Patient Goals at Discharge (Final Review):  Goals and Risk Factor Review - 05/19/18 1817    Core Components/Risk Factors/Patient Goals Review          Personal Goals Review  Weight Management/Obesity;Hypertension;Lipids    Review  currently out on medical hold.            ITP Comments: ITP Comments    Row Name 04/27/18 0829 05/03/18 1418 05/19/18 1816   ITP Comments  Medical Director- Dr. Fransico Him, MD  pt started group exercise session. pt demonstrates eagerness to participate. pt tolerated light activity without difficulty, however c/o muscle soreness from walk test. pt oriented to exercise equipment and safety routine.   30 day ITP review. pt currently out with back pain from injury at home on her lawn.  pt unsure when she will be able to return to exercise.  Comments:

## 2018-05-21 ENCOUNTER — Encounter (HOSPITAL_COMMUNITY): Payer: 59

## 2018-05-24 ENCOUNTER — Telehealth (HOSPITAL_COMMUNITY): Payer: Self-pay | Admitting: Internal Medicine

## 2018-05-24 ENCOUNTER — Encounter (HOSPITAL_COMMUNITY): Payer: 59

## 2018-05-26 ENCOUNTER — Encounter (HOSPITAL_COMMUNITY): Payer: 59

## 2018-05-28 ENCOUNTER — Encounter (HOSPITAL_COMMUNITY): Payer: 59

## 2018-05-31 ENCOUNTER — Encounter (HOSPITAL_COMMUNITY): Payer: 59

## 2018-06-01 ENCOUNTER — Telehealth: Payer: Self-pay

## 2018-06-01 NOTE — Telephone Encounter (Signed)
Contacted patient to reschedule her appointment if she didn't have in concerning issues that needed to be addressed as soon as possible and if agreed we would be rescheduling for 3 months.  Spoke with patient she said that she agreed to reschedule and if anything came up she would call in to get a sooner appointment.

## 2018-06-02 ENCOUNTER — Encounter (HOSPITAL_COMMUNITY): Payer: 59

## 2018-06-04 ENCOUNTER — Encounter (HOSPITAL_COMMUNITY): Payer: 59

## 2018-06-07 ENCOUNTER — Encounter (HOSPITAL_COMMUNITY): Payer: 59

## 2018-06-07 ENCOUNTER — Ambulatory Visit: Payer: 59 | Admitting: Internal Medicine

## 2018-06-09 ENCOUNTER — Encounter (HOSPITAL_COMMUNITY): Payer: 59

## 2018-06-10 NOTE — Progress Notes (Signed)
Cardiology Office Note:    Date:  04/26/2018   ID:  Diane Barker, DOB 1950/06/28, MRN [id number]  PCP:  Shon Baton, MD  Cardiologist:  No primary care provider on file.  Electrophysiologist:  None   Referring MD: Shon Baton, MD   CAD follow-up  History of Present Illness:    Diane Barker is a 68 y.o. female with a hx of coronary artery disease who presents today for 45-month follow-up of medical management and reevaluation of chest pain.    She tells me that she has had no episodes of chest pain that were concerning to her but is continuing to try to get in shape and has started cardiac rehab for secondary prevention of coronary artery disease and complications related to CAD.  She is tolerating rehab well.  Her primary concern today is a lack of appetite, depressed mood.  She is generally quite jovial, however on today's exam appears tearful.  She shares with me several life stressors including difficult social relations with an acquaintance.  She tells me that the symptoms of feeling fatigued all day, depressed mood, and lack of energy to coincide with the time she began taking medicines for treatment of CAD.  The patient denies chest pain, chest pressure, dyspnea at rest or with exertion, palpitations, PND, orthopnea, or leg swelling. Denies syncope or presyncope. Denies dizziness or lightheadedness.  Past Medical History:  Diagnosis Date  . Anxiety   . Arthritis    "all over my body" (01/15/2018)  . Breast cancer, right breast ([place]) 2000   Rt lumpectomy, Chemo/XRT/rt axillary node   . Bronchial asthma   . Chronic lower back pain   . Depression   . Diverticulosis   . DVT (deep venous thrombosis) ([place]) 06/2013   RLE  . GERD (gastroesophageal reflux disease)   . High cholesterol   . Hx of adenomatous colonic polyps   . Hypertension   . Obesity   . Osteoarthritis   . Personal history of chemotherapy   . Personal history of radiation therapy   . Pulmonary embolism ([place]) 06/2013    "both lungs"  . Spondylolisthesis     Past Surgical History:  Procedure Laterality Date  . BREAST BIOPSY Right 2000  . BREAST LUMPECTOMY Right 2000   for CA txed w/ chemo and radiation, right  . CARDIAC CATHETERIZATION    . HERNIA REPAIR    . INTRAVASCULAR PRESSURE WIRE/FFR STUDY N/A 01/18/2018   Procedure: INTRAVASCULAR PRESSURE WIRE/FFR STUDY;  Surgeon: Burnell Blanks, MD;  Location: [facility];  Service: Cardiovascular;  Laterality: N/A;  . LEFT HEART CATH AND CORONARY ANGIOGRAPHY N/A 01/18/2018   Procedure: LEFT HEART CATH AND CORONARY ANGIOGRAPHY;  Surgeon: Burnell Blanks, MD;  Location: [facility];  Service: Cardiovascular;  Laterality: N/A;  . TUBAL LIGATION    . UMBILICAL HERNIA REPAIR  1983    Current Medications: Current Meds  Medication Sig  . acetaminophen (TYLENOL) 500 MG tablet Take 500 mg by mouth every 6 (six) hours as needed for mild pain.  Marland Kitchen atorvastatin (LIPITOR) 40 MG tablet Take 1 tablet (40 mg total) by mouth daily at 6 PM.  . Cholecalciferol (VITAMIN D) 2000 UNITS CAPS Take 4,000 Units by mouth daily.   . ciclopirox (LOPROX) 0.77 % cream Apply 1 application topically 2 (two) times daily.  Marland Kitchen dexlansoprazole (DEXILANT) 60 MG capsule Take 60 mg by mouth daily.  Marland Kitchen escitalopram (LEXAPRO) 10 MG tablet Take 10 mg by mouth.  Marland Kitchen  isosorbide mononitrate (IMDUR) 30 MG 24 hr tablet Take 1 tablet (30 mg total) by mouth daily.  Marland Kitchen PROAIR HFA 108 (90 Base) MCG/ACT inhaler Inhale 2 puffs into the lungs every 4 (four) hours as needed for wheezing or shortness of breath.   . vitamin B-12 (CYANOCOBALAMIN) 500 MCG tablet Take 500 mcg by mouth daily.  Alveda Reasons 20 MG TABS tablet Take 1 tablet by mouth daily.  . [DISCONTINUED] metoprolol succinate (TOPROL-XL) 25 MG 24 hr tablet Take 1 tablet (25 mg total) by mouth daily. (Patient not taking: Reported on 05/03/2018)     Allergies:   Erythromycin; Gabapentin; Morphine; Penicillins; and Relafen  [nabumetone]   Social History   Socioeconomic History  . Marital status: Single    Spouse name: Not on file  . Number of children: 2  . Years of education: 10  . Highest education level: Not on file  Occupational History  . 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 Financial resource strain: Not on file  . Food insecurity:    Worry: Not on file    Inability: Not on file  . Transportation needs:    Medical: Not on file    Non-medical: Not on file  Tobacco Use  . Smoking status: Never Smoker  . Smokeless tobacco: Never Used  Substance and Sexual Activity  . Alcohol use: Never    Alcohol/week: 0.0 standard drinks    Frequency: Never  . Drug use: Never  . Sexual activity: Not Currently  Lifestyle  . Physical activity:    Days per week: Not on file    Minutes per session: Not on file  . Stress: Not on file  Relationships  . Social connections:    Talks on phone: Not on file    Gets together: Not on file    Attends religious service: Not on file    Active member of club or organization: Not on file    Attends meetings of clubs or organizations: Not on file    Relationship status: Not on file  Other Topics Concern  . Not on file  Social History Narrative   Lives at home alone   Caffeine use- soda 16-24 oz weekly     Family History: The patient's family history includes Asthma in her daughter and mother; Breast cancer in her maternal aunt; COPD in her mother; Colon cancer in her paternal aunt; Diabetes in her brother, daughter, and mother; Heart attack in her brother and mother; Hypertension in her brother; Kidney failure in her brother; Lung cancer in her father.  ROS:   Please see the history of present illness.    All other systems reviewed and are negative.  EKGs/Labs/Other Studies Reviewed:    The following studies were reviewed today:  EKG: Not obtained today  Recent Labs: 01/15/2018: TSH 2.974  01/16/2018: ALT 10; Magnesium 2.0 01/19/2018: BUN 13; Creatinine, Ser 0.97; Hemoglobin 12.7; Platelets 210; Potassium 4.2; Sodium 137  Recent Lipid Panel    Component Value Date/Time   CHOL 182 01/16/2018 0238   TRIG 60 01/16/2018 0238   HDL 46 01/16/2018 0238   CHOLHDL 4.0 01/16/2018 0238   VLDL 12 01/16/2018 0238   LDLCALC 124 (H) 01/16/2018 0238    Physical Exam:    VS:  BP 115/74   Pulse 64   Ht 5' 5.5" (1.664 m)   Wt 264 lb 12.8 oz (120.1 kg)   BMI 43.39 kg/m  Wt Readings from Last 3 Encounters:  04/27/18 262 lb 2 oz (118.9 kg)  04/26/18 264 lb 12.8 oz (120.1 kg)  02/05/18 264 lb 6.4 oz (119.9 kg)    Constitutional: No acute distress Eyes: pupils equally round and reactive to light, sclera non-icteric, normal conjunctiva and lids ENMT: normal dentition, moist mucous membranes Cardiovascular: regular rhythm, normal rate, no murmurs. S1 and S2 normal. Radial pulses normal bilaterally. No jugular venous distention.  Respiratory: clear to auscultation bilaterally GI : normal bowel sounds, soft and nontender. No distention.   MSK: extremities warm, well perfused. No edema.  NEURO: grossly nonfocal exam, moves all extremities. PSYCH: alert and oriented x 3.  Tearful, and depressed mood.  ASSESSMENT:    1. Coronary artery disease involving native coronary artery of native heart without angina pectoris   2. Depressed mood    PLAN:    Diane Barker and I have thoroughly reviewed her medications, and I am suspicious that her metoprolol may be contributing to her new symptoms of fatigue, lack of "get up and go", depressed mood, and tearful emotion.  She has a history of mood disorder and is already on Lexapro.  I believe it would be in the patient's best interest to hold metoprolol for 2 weeks and see if she has any resolution of the symptoms.  If she has resolution of symptoms, we should consider indefinitely holding metoprolol and other beta-blockers that could create similar  symptoms.  Though this would be ideal to continue in the setting of coronary artery disease, she enjoys a happy life and is generally in good spirits, and we have participated in shared decision making regarding goals and quality of life.  We will hold metoprolol and she will report to [place] if she has improvement in symptoms at which point we can decide if we are going to stop this medication.  All other medications for cardiovascular indication should be continued at this time as she is tolerating them well.  TIME SPENT WITH PATIENT: 25 minutes of direct patient care. More than 50% of that time was spent on coordination of care and counseling regarding medical management of CAD, and side effects of beta-blockade.  Cherlynn Kaiser, MD [place]  [facility]    Medication Adjustments/Labs and Tests Ordered: Current medicines are reviewed at length with the patient today.  Concerns regarding medicines are outlined above.  No orders of the defined types were placed in this encounter.  No orders of the defined types were placed in this encounter.   Patient Instructions  Medication Instructions:  Your physician recommends that you continue on your current medications as directed. Please refer to the Current Medication list given to you today.  HOLD Metoprolol for 2 weeks. Please call the office to give an update in your symptoms.   If you need a refill on your cardiac medications before your next appointment, please call your pharmacy.   Lab work: None ordered  Testing/Procedures: None ordered  Follow-Up: At [facility], you and your health needs are our priority.  As part of our continuing mission to provide you with exceptional heart care, we have created designated Provider Care Teams.  These Care Teams include your primary Cardiologist (physician) and Advanced Practice Providers (APPs -  Physician Assistants and Nurse Practitioners) who all work together to provide you with  the care you need, when you need it. You will need a follow up appointment in 6 weeks.

## 2018-06-11 ENCOUNTER — Encounter (HOSPITAL_COMMUNITY): Payer: 59

## 2018-06-14 ENCOUNTER — Encounter (HOSPITAL_COMMUNITY): Payer: 59

## 2018-06-16 ENCOUNTER — Encounter (HOSPITAL_COMMUNITY): Payer: 59

## 2018-06-18 ENCOUNTER — Encounter (HOSPITAL_COMMUNITY): Payer: 59

## 2018-06-21 ENCOUNTER — Encounter (HOSPITAL_COMMUNITY): Payer: 59

## 2018-06-23 ENCOUNTER — Encounter (HOSPITAL_COMMUNITY): Payer: 59

## 2018-06-25 ENCOUNTER — Encounter (HOSPITAL_COMMUNITY): Payer: 59

## 2018-06-28 ENCOUNTER — Encounter (HOSPITAL_COMMUNITY): Payer: 59

## 2018-06-30 ENCOUNTER — Encounter (HOSPITAL_COMMUNITY): Payer: 59

## 2018-07-02 ENCOUNTER — Encounter (HOSPITAL_COMMUNITY): Payer: 59

## 2018-07-05 ENCOUNTER — Encounter (HOSPITAL_COMMUNITY): Payer: 59

## 2018-07-07 ENCOUNTER — Encounter (HOSPITAL_COMMUNITY): Payer: 59

## 2018-07-09 ENCOUNTER — Encounter (HOSPITAL_COMMUNITY): Payer: 59

## 2018-07-12 ENCOUNTER — Encounter (HOSPITAL_COMMUNITY): Payer: 59

## 2018-07-14 ENCOUNTER — Encounter (HOSPITAL_COMMUNITY): Payer: 59

## 2018-07-16 ENCOUNTER — Encounter (HOSPITAL_COMMUNITY): Payer: 59

## 2018-07-19 ENCOUNTER — Encounter (HOSPITAL_COMMUNITY): Payer: 59

## 2018-07-21 ENCOUNTER — Encounter (HOSPITAL_COMMUNITY): Payer: 59

## 2018-07-23 ENCOUNTER — Telehealth: Payer: Self-pay | Admitting: Internal Medicine

## 2018-07-23 ENCOUNTER — Encounter (HOSPITAL_COMMUNITY): Payer: 59

## 2018-07-23 DIAGNOSIS — J209 Acute bronchitis, unspecified: Secondary | ICD-10-CM

## 2018-07-23 DIAGNOSIS — J440 Chronic obstructive pulmonary disease with acute lower respiratory infection: Secondary | ICD-10-CM

## 2018-07-23 MED ORDER — IPRATROPIUM-ALBUTEROL 0.5-2.5 (3) MG/3ML IN SOLN: 3.0000 mL | Freq: Four times a day (QID) | RESPIRATORY_TRACT | 12 refills | Status: DC | PRN

## 2018-07-23 NOTE — Telephone Encounter (Signed)
Order has been placed for DME so pt can receive new nebulizer and also duoneb solution. Rx has been signed by [facility] and was given to [facility] for her to handle.  Called and spoke with pt stating this info to her and pt verbalized understanding. Nothing further needed.

## 2018-07-23 NOTE — Telephone Encounter (Signed)
Primary Pulmonologist: CY Last office visit and with whom: CY-01/08/18 What do we see them for (pulmonary problems): chronic bronchitis  Last OV assessment/plan:Return in about 1 year (around 01/09/2019).  To lose weight, you need to eat less and move more. Try avoiding "white" foods- like  Bread, pasta, chips, potatoes.  Sample Trelegy inhaler     Inhale 1 puff, then rinse mouth, once daily. See if shortness of breath, cough and stamina are any better.   Please call as needed        Was appointment offered to patient (explain)?  no   Reason for call: pt stated she was mowing grass yesterday and was SOB and really wheezing.  Pt had to use her albuterol inhaler and then use it again before the 4 hours was up.  Pt had a dry cough and denied feeling febrile.  She has not used any OTC meds to help.  Pt was debating going to ED for breathing treatment.  Pt is not wheezing today but was wondering if she could get a nebulizer machine and medicine for it.  Dr. Annamaria Boots please advise.  (examples of things to ask: : When did symptoms start? Fever? Cough? Productive? Color to sputum? More sputum than usual? Wheezing? Have you needed increased oxygen? Are you taking your respiratory medications? What over the counter measures have you tried?)

## 2018-07-23 NOTE — Telephone Encounter (Signed)
Yes- order DME, new compressor neb misc                              Duoneb (ipratropium-albuterol) neb solution, 75 ml, 1 neb every 6 hours if needed, ref x 12

## 2018-07-26 ENCOUNTER — Encounter (HOSPITAL_COMMUNITY): Payer: 59

## 2018-07-26 ENCOUNTER — Telehealth: Payer: Self-pay | Admitting: Internal Medicine

## 2018-07-26 NOTE — Telephone Encounter (Signed)
Spoke with Levada Dy. She stated that the patient will need an OV that stated that the patient was using a neb machine. She reviewed patient's previous office notes and did not see where a neb was mentioned.   Explained to her that the patient had called in sick last week and it was recommended that she have a neb machine. She still stated that the patient need an OV or televisit.   CY, please advise if you are ok with this or if you want [place] to use another DME company.

## 2018-07-26 NOTE — Telephone Encounter (Signed)
Called & spoke w/ pt regarding CY's recommendations. Pt verbalized understanding and agreed to setting up an office visit for Friday 07/30/2018 at 12:00 PM w/ CY. Pt negative for COVID-19 screen.  Appointment has been scheduled. Routing to CY as an [place]. Nothing further needed at this time.

## 2018-07-26 NOTE — Telephone Encounter (Signed)
This is an insurance issue so we need to do it. Please see if we can get her on for a real or tele-visit to qualify her for a nebulizer machine.

## 2018-07-28 ENCOUNTER — Encounter (HOSPITAL_COMMUNITY): Payer: 59

## 2018-07-30 ENCOUNTER — Encounter: Payer: Self-pay | Admitting: Internal Medicine

## 2018-07-30 ENCOUNTER — Other Ambulatory Visit: Payer: Self-pay

## 2018-07-30 ENCOUNTER — Ambulatory Visit (INDEPENDENT_AMBULATORY_CARE_PROVIDER_SITE_OTHER): Payer: 59 | Admitting: Internal Medicine

## 2018-07-30 ENCOUNTER — Encounter (HOSPITAL_COMMUNITY): Payer: 59

## 2018-07-30 ENCOUNTER — Other Ambulatory Visit: Payer: Self-pay | Admitting: Internal Medicine

## 2018-07-30 DIAGNOSIS — Z1231 Encounter for screening mammogram for malignant neoplasm of breast: Secondary | ICD-10-CM

## 2018-07-30 DIAGNOSIS — K219 Gastro-esophageal reflux disease without esophagitis: Secondary | ICD-10-CM | POA: Diagnosis not present

## 2018-07-30 DIAGNOSIS — J4531 Mild persistent asthma with (acute) exacerbation: Secondary | ICD-10-CM | POA: Diagnosis not present

## 2018-07-30 MED ORDER — AEROCHAMBER MV MISC: 0 refills | Status: AC

## 2018-07-30 MED ORDER — BUDESONIDE-FORMOTEROL FUMARATE 80-4.5 MCG/ACT IN AERO: INHALATION_SPRAY | RESPIRATORY_TRACT | 12 refills | Status: DC

## 2018-07-30 MED ORDER — BUDESONIDE-FORMOTEROL FUMARATE 80-4.5 MCG/ACT IN AERO: 2.0000 | INHALATION_SPRAY | Freq: Two times a day (BID) | RESPIRATORY_TRACT | 0 refills | Status: DC

## 2018-07-30 NOTE — Patient Instructions (Addendum)
Script printed to order aerochamber spacer for use with inhalers  Sample and script sent Symbicort 80 or 160    Inhale 2 puff, then rinse mouth twice daily  You need to have a nebulizer machine- we will help. Use it up to every 6 hours if needed for increased wheeze and chest tightness.  Please call as needed

## 2018-07-30 NOTE — Progress Notes (Signed)
Patient ID: Diane Barker, female    DOB: [birth year], 68 y.o.   MRN: [id number]  HPI F never smoker followed for bronchitis with hx rhinitis, GERD, hx breast cancer, Hx DVT/ PE/ Xarelto. Office Spirometry 04/30/2015- WNL- FVC 2.75/87%, FEV1 2.29/93%, FEV1/FVC 0.83, FEF 25-75 percent 2.83/126%. PFT 01/08/2018- insignificant response to dilator, mild restriction, diffusion mildly reduced.  FVC 2.52/78%, FEV1 2.21/90%, ratio 1.88, FEF 25-75% 3.30/259%, TLC 75%, DLCO 63% ------------------------------------------------------------------------------  01/08/2018- 68 year old female never smoker followed for bronchitis, rhinitis, complicated by GERD, history breast cancer/XRT, history DVT/PE/Xarelto PFT today Obstructive Chronic Bronchitis: Pt had PFT today; Pt  notes she felt better with Symbicort inhaler and aerochamber.  ProAir HFA, Recent hard cough triggered tussive soreness left upper anterior chest wall and scapula.  Cough is now better.  Pain nonexertional. Feels that Dexilant is not controlling her reflux.  She is scheduled for pH probe but says she cannot tolerate the idea of having something put down her nose. Main complaint is dyspnea on exertion, gradual and attributed to weight gain.  Gets no regular exercise.  Little wheeze. Barium swallow 10/30/2017-  Negative esophagram. CXR 10/02/2017- IMPRESSION: 1. Right upper lobe atelectatic changes and/or scarring again noted. 2.  Right-sided aortic arch again noted. PFT 01/08/2018- insignificant response to dilator, mild restriction, diffusion mildly reduced.  FVC 2.52/78%, FEV1 2.21/90%, ratio 1.88, FEF 25-75% 3.30/259%, TLC 75%, DLCO 63%  07/30/2018- 68 year old female never smoker followed for bronchitis, rhinitis, complicated by GERD, history breast cancer/XRT, history DVT/PE/Xarelto PFT today -----usual DOE, cough, & wheeze; here because DME: [employer] requires OV for nebulizer compressor ProAir hfa, Neb Duoneb,  Notes increased cough,  usually dry, more frequent usee of rescue inhaler, reflux with occ food hang-up. She declined EGD- didn't want to be sedated and have tube down throat.   Review of Systems-See HPI   + = positive Constitutional:   No-   weight loss, night sweats, fevers, chills, fatigue, lassitude. HEENT:   +  headaches, No-difficulty swallowing, tooth/dental problems, +sore throat,        sneezing, itching, ear ache, nasal congestion, +post nasal drip,  CV:  +atypical chest pain, orthopnea, PND, swelling in lower extremities, anasarca,  dizziness, palpitations Resp: +shortness of breath with exertion or at rest.              + productive cough,  + non-productive cough,  No- coughing up of blood.           change in color of mucus.  No- wheezing.   Skin: No-   rash or lesions. GI:  +  heartburn, indigestion, abdominal pain, +nausea, vomiting,  GU: MS:  No-   joint pain or swelling.   Neuro-     nothing unusual Psych:  No- change in mood or affect. No depression or anxiety.  No memory loss.  Objective:  OBJ- Physical Exam General- Alert, Oriented, Affect-appropriate/ calm, Distress- none acute,  + obese Skin- rash-none, lesions- none, excoriation+  Lymphadenopathy- none Head- atraumatic            Eyes- Gross vision intact, PERRLA, conjunctivae and secretions clear            Ears- Hearing, canals-normal            Nose- clear, no-Septal dev, mucus, polyps, erosion, perforation             Throat- Mallampati IV , mucosa clear , drainage- none, tonsils- atrophic, + throat clearing Neck- flexible , trachea midline, no stridor , thyroid  nl, carotid no bruit Chest - symmetrical excursion , unlabored           Heart/CV- RRR , no murmur , no gallop  , no rub, nl s1 s2                           - JVD- none , edema +1, stasis changes- none, varices- none           Lung- + clear, unlabored, wheeze- none, cough -none,                                           dullness-none, rub- none           Chest wall-  Abd-   Br/ Gen/ Rectal- Not done, not indicated Extrem- cyanosis- none, clubbing, none, atrophy- none, strength- nl, + excoriated stasis dermatitis Neuro- grossly intact to observation

## 2018-08-02 ENCOUNTER — Encounter (HOSPITAL_COMMUNITY): Payer: 59

## 2018-08-04 ENCOUNTER — Encounter (HOSPITAL_COMMUNITY): Payer: 59

## 2018-08-21 ENCOUNTER — Ambulatory Visit: Payer: 59

## 2018-09-01 ENCOUNTER — Telehealth: Payer: Self-pay | Admitting: *Deleted

## 2018-09-01 NOTE — Telephone Encounter (Signed)
   Primary Cardiologist: Dr Margaretann Loveless   Pt contacted.  History and symptoms reviewed.  Pt will f/u with [facility] provider as scheduled.  Pt. advised that we are restricting visitors at this time and request that only patients present for check-in prior to their appointment.  All other visitors should remain in their car.  If necessary, only one visitor may come with the patient, into the building. For everyone's safety, all patients and visitor entering our practice area should expect to be screened again prior to entering our waiting area.- FACE COVERING , NO VISITOR  VERBALIZED UNDERSTANDING.   Raiford Simmonds, RN  09/01/2018 4:16 PM        COVID-19 Pre-Screening Questions:  . In the past 7 to 10 days have you had a cough,  shortness of breath, headache, congestion, fever (100 or greater) body- NO .  aches, chills, sore throat, or sudden loss of taste or sense of smell?NO . Have you been around anyone with known Covid 19.-NO . Have you been around anyone who is awaiting Covid 19 test results in the past 7 to 10 days?-NO . Have you been around anyone who has been exposed to Covid 19, or has mentioned symptoms of Covid 19 within the past 7 to 10 days?-NO  If you have any concerns/questions about symptoms patients report during screening (either on the phone or at threshold). Contact the provider seeing the patient or DOD for further guidance.  If neither are available contact a member of the leadership team.

## 2018-09-07 ENCOUNTER — Telehealth: Payer: Self-pay | Admitting: *Deleted

## 2018-09-07 NOTE — Telephone Encounter (Signed)
Spoke with patient @ 2:13 pm appointment confirmation---reminded to wear mask, bring insurance cards, medications and visitors are not allowed unless patient assistance is needed.  Patient voiced her understanding.

## 2018-09-08 ENCOUNTER — Ambulatory Visit: Payer: 59 | Admitting: Internal Medicine

## 2018-09-09 ENCOUNTER — Ambulatory Visit: Payer: 59 | Admitting: Internal Medicine

## 2018-09-17 DIAGNOSIS — J4530 Mild persistent asthma, uncomplicated: Secondary | ICD-10-CM | POA: Insufficient documentation

## 2018-09-17 DIAGNOSIS — J4531 Mild persistent asthma with (acute) exacerbation: Secondary | ICD-10-CM | POA: Insufficient documentation

## 2018-09-17 NOTE — Assessment & Plan Note (Signed)
Symptom overlap with GERD/ reflux Plan- She needs a nebulizer machine for better drug delivery. Also add aerochamber spacer to her rescue inhaler and Symbicort. Med talk.

## 2018-09-17 NOTE — Assessment & Plan Note (Signed)
Symptomatic heart burn with some food hang-up, but she refused EGD. Needs ongoing discussion at least w PCP- may benefit from Carafate. She feels this aggravates her cough.

## 2018-09-23 ENCOUNTER — Telehealth: Payer: Self-pay | Admitting: Internal Medicine

## 2018-09-23 NOTE — Telephone Encounter (Signed)
I called to confirm her appt for 09-24-18 with Dr Margaretann Loveless.        COVID-19 Pre-Screening Questions:   In the past 7 to 10 days have you had a cough,  shortness of breath, headache, congestion, fever (100 or greater) body aches, chills, sore throat, or sudden loss of taste or sense of smell? no  Have you been around anyone with known Covid 19. no  Have you been around anyone who is awaiting Covid 19 test results in the past 7 to 10 days?  Have you been around anyone who has been exposed to Covid 19, or has mentioned symptoms of Covid 19 within the past 7 to 10 days?  no  If you have any concerns/questions about symptoms patients report during screening (either on the phone or at threshold). Contact the provider seeing the patient or DOD for further guidance.  If neither are available contact a member of the leadership team.

## 2018-09-24 ENCOUNTER — Other Ambulatory Visit: Payer: Self-pay

## 2018-09-24 ENCOUNTER — Encounter: Payer: Self-pay | Admitting: Internal Medicine

## 2018-09-24 ENCOUNTER — Ambulatory Visit (INDEPENDENT_AMBULATORY_CARE_PROVIDER_SITE_OTHER): Payer: 59 | Admitting: Internal Medicine

## 2018-09-24 VITALS — BP 118/80 | HR 75 | Temp 97.5°F | Ht 65.0 in | Wt 262.6 lb

## 2018-09-24 DIAGNOSIS — Z86718 Personal history of other venous thrombosis and embolism: Secondary | ICD-10-CM

## 2018-09-24 DIAGNOSIS — K219 Gastro-esophageal reflux disease without esophagitis: Secondary | ICD-10-CM

## 2018-09-24 DIAGNOSIS — R0609 Other forms of dyspnea: Secondary | ICD-10-CM

## 2018-09-24 DIAGNOSIS — I2510 Atherosclerotic heart disease of native coronary artery without angina pectoris: Secondary | ICD-10-CM

## 2018-09-24 DIAGNOSIS — Z86711 Personal history of pulmonary embolism: Secondary | ICD-10-CM

## 2018-09-24 MED ORDER — FUROSEMIDE 20 MG PO TABS: 20.0000 mg | ORAL_TABLET | Freq: Every day | ORAL | 6 refills | Status: DC

## 2018-09-24 MED ORDER — ASPIRIN 81 MG PO TBEC: 81.0000 mg | DELAYED_RELEASE_TABLET | Freq: Every day | ORAL | 1 refills | Status: DC

## 2018-09-24 NOTE — Patient Instructions (Signed)
Medication Instructions:   Start lasix ( furosemide ) 20 mg  Take one tablet daily ( preferably in the morning)   CONTACT  THE OFFICE IN ABOUT 10 DAYS - TO LET DR Margaretann Loveless KNOW HOW  THE MEDICATION IS WORKING.  If you need a refill on your cardiac medications before your next appointment, please call your pharmacy.   Lab work:  Not needed.  Testing/Procedures: Not needed  Follow-Up: At [facility], you and your health needs are our priority.  As part of our continuing mission to provide you with exceptional heart care, we have created designated Provider Care Teams.  These Care Teams include your primary Cardiologist (physician) and Advanced Practice Providers (APPs -  Physician Assistants and Nurse Practitioners) who all work together to provide you with the care you need, when you need it. . You will need a follow up appointment in  July 29 ,2020.  Please call our office 2 months in advance to schedule this appointment.  You may see Elouise Munroe, MD*or one of the following Advanced Practice Providers on your designated Care Team:   . Rosaria Ferries, PA-C . Jory Sims, DNP, ANP  Any Other Special Instructions Will Be Listed Below (If Applicable).

## 2018-09-29 ENCOUNTER — Other Ambulatory Visit: Payer: Self-pay

## 2018-09-29 DIAGNOSIS — R0789 Other chest pain: Secondary | ICD-10-CM

## 2018-09-29 DIAGNOSIS — I2510 Atherosclerotic heart disease of native coronary artery without angina pectoris: Secondary | ICD-10-CM

## 2018-09-29 DIAGNOSIS — I2609 Other pulmonary embolism with acute cor pulmonale: Secondary | ICD-10-CM

## 2018-09-29 DIAGNOSIS — R4589 Other symptoms and signs involving emotional state: Secondary | ICD-10-CM

## 2018-09-29 DIAGNOSIS — I824Z1 Acute embolism and thrombosis of unspecified deep veins of right distal lower extremity: Secondary | ICD-10-CM

## 2018-09-29 DIAGNOSIS — I208 Other forms of angina pectoris: Secondary | ICD-10-CM

## 2018-10-05 ENCOUNTER — Telehealth: Payer: Self-pay | Admitting: Internal Medicine

## 2018-10-05 DIAGNOSIS — Z79899 Other long term (current) drug therapy: Secondary | ICD-10-CM

## 2018-10-05 NOTE — Telephone Encounter (Signed)
New Message    Patient is calling in stating that she was told to call in to discuss the new medicine (fluid pill) and how it is working for her.

## 2018-10-06 ENCOUNTER — Ambulatory Visit: Payer: 59

## 2018-10-06 NOTE — Telephone Encounter (Signed)
Pt calling stating she was told to call back to inform Dr. Margaretann Loveless if the lasix has improved her symptoms. Pt report she feels much better and was able to walk outside to her truck without feeling SOB.   Will route to MD to make aware.

## 2018-10-06 NOTE — Telephone Encounter (Signed)
Pt updated and state she would prefer to keep appointment but will call back if she change her mind. Lab orders placed.

## 2018-10-06 NOTE — Telephone Encounter (Signed)
That is wonderful news. She has an upcoming appt with me, if she is feeling well we can reschedule for a few months from now.   I would like for her to get a BMET in the next week or so to ensure kidney function is stable on lasix.

## 2018-10-06 NOTE — Telephone Encounter (Signed)
Left message to call back  

## 2018-10-12 NOTE — Progress Notes (Deleted)
Cardiology Office Note:    Date:  10/12/2018   ID:  Diane Barker, DOB [birth year], MRN [id number]  PCP:  Shon Baton, MD  Cardiologist:  No primary care provider on file.  Electrophysiologist:  None   Referring MD: Shon Baton, MD   Chief Complaint: ***  History of Present Illness:    Diane Barker is a 68 y.o. female with a hx of ***  The patient denies chest pain, chest pressure, dyspnea at rest or with exertion, palpitations, PND, orthopnea, or leg swelling. Denies syncope or presyncope. Denies dizziness or lightheadedness. Denies snoring and has not been evaluated for sleep apnea.  Past Medical History:  Diagnosis Date  . Anxiety   . Arthritis    "all over my body" (01/15/2018)  . Breast cancer, right breast ([place]) 2000   Rt lumpectomy, Chemo/XRT/rt axillary node   . Bronchial asthma   . Chronic lower back pain   . Depression   . Diverticulosis   . DVT (deep venous thrombosis) ([place]) 06/2013   RLE  . GERD (gastroesophageal reflux disease)   . High cholesterol   . Hx of adenomatous colonic polyps   . Hypertension   . Obesity   . Osteoarthritis   . Personal history of chemotherapy   . Personal history of radiation therapy   . Pulmonary embolism ([place]) 06/2013   "both lungs"  . Spondylolisthesis     Past Surgical History:  Procedure Laterality Date  . BREAST BIOPSY Right 2000  . BREAST LUMPECTOMY Right 2000   for CA txed w/ chemo and radiation, right  . CARDIAC CATHETERIZATION    . HERNIA REPAIR    . INTRAVASCULAR PRESSURE WIRE/FFR STUDY N/A 01/18/2018   Procedure: INTRAVASCULAR PRESSURE WIRE/FFR STUDY;  Surgeon: Burnell Blanks, MD;  Location: [facility];  Service: Cardiovascular;  Laterality: N/A;  . LEFT HEART CATH AND CORONARY ANGIOGRAPHY N/A 01/18/2018   Procedure: LEFT HEART CATH AND CORONARY ANGIOGRAPHY;  Surgeon: Burnell Blanks, MD;  Location: [facility];  Service: Cardiovascular;  Laterality: N/A;  . TUBAL LIGATION    .  UMBILICAL HERNIA REPAIR  1983    Current Medications: No outpatient medications have been marked as taking for the 10/13/18 encounter (Appointment) with Elouise Munroe, MD.     Allergies:   Erythromycin, Gabapentin, Morphine, Penicillins, and Relafen [nabumetone]   Social History   Socioeconomic History  . Marital status: Single    Spouse name: Not on file  . Number of children: 2  . Years of education: 10  . Highest education level: Not on file  Occupational History  . 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 Financial resource strain: Not on file  . Food insecurity    Worry: Not on file    Inability: Not on file  . Transportation needs    Medical: Not on file    Non-medical: Not on file  Tobacco Use  . Smoking status: Never Smoker  . Smokeless tobacco: Never Used  Substance and Sexual Activity  . Alcohol use: Never    Alcohol/week: 0.0 standard drinks    Frequency: Never  . Drug use: Never  . Sexual activity: Not Currently  Lifestyle  . Physical activity    Days per week: Not on file    Minutes per session: Not on file  . Stress: Not on file  Relationships  . Social connections  Talks on phone: Not on file    Gets together: Not on file    Attends religious service: Not on file    Active member of club or organization: Not on file    Attends meetings of clubs or organizations: Not on file    Relationship status: Not on file  Other Topics Concern  . Not on file  Social History Narrative   Lives at home alone   Caffeine use- soda 16-24 oz weekly     Family History: The patient's ***family history includes Asthma in her daughter and mother; Breast cancer in her maternal aunt; COPD in her mother; Colon cancer in her paternal aunt; Diabetes in her brother, daughter, and mother; Heart attack in her brother and mother; Hypertension in her brother; Kidney failure in her brother; Lung cancer in her father.   ROS:   Please see the history of present illness.    All other systems reviewed and are negative.  EKGs/Labs/Other Studies Reviewed:    The following studies were reviewed today:  EKG:  ***  Epworth Sleepiness Scale: ***  Recent Labs: 01/15/2018: TSH 2.974 01/16/2018: ALT 10; Magnesium 2.0 01/19/2018: BUN 13; Creatinine, Ser 0.97; Hemoglobin 12.7; Platelets 210; Potassium 4.2; Sodium 137  Recent Lipid Panel    Component Value Date/Time   CHOL 182 01/16/2018 0238   TRIG 60 01/16/2018 0238   HDL 46 01/16/2018 0238   CHOLHDL 4.0 01/16/2018 0238   VLDL 12 01/16/2018 0238   LDLCALC 124 (H) 01/16/2018 0238    Physical Exam:    VS:  There were no vitals taken for this visit.    Wt Readings from Last 5 Encounters:  09/24/18 262 lb 9.6 oz (119.1 kg)  07/30/18 268 lb (121.6 kg)  04/27/18 262 lb 2 oz (118.9 kg)  04/26/18 264 lb 12.8 oz (120.1 kg)  02/05/18 264 lb 6.4 oz (119.9 kg)     Constitutional: No acute distress Eyes: sclera non-icteric, normal conjunctiva and lids ENMT: normal dentition, moist mucous membranes Cardiovascular: regular rhythm, normal rate, no murmurs. S1 and S2 normal. Radial pulses normal bilaterally. No jugular venous distention.  Respiratory: clear to auscultation bilaterally GI : normal bowel sounds, soft and nontender. No distention.   MSK: extremities warm, well perfused. No edema.  NEURO: grossly nonfocal exam, moves all extremities. PSYCH: alert and oriented x 3, normal mood and affect.      ASSESSMENT:    No diagnosis found. PLAN:     TIME SPENT WITH PATIENT: *** minutes of direct patient care. More than 50% of that time was spent on coordination of care and counseling regarding ***.  Cherlynn Kaiser, MD [address]  [facility]   Medication Adjustments/Labs and Tests Ordered: Current medicines are reviewed at length with the patient today.  Concerns regarding medicines are outlined above.  No orders of the defined types were  placed in this encounter.  No orders of the defined types were placed in this encounter.   There are no Patient Instructions on file for this visit.

## 2018-10-12 NOTE — Progress Notes (Signed)
Cardiology Office Note:    Date:  09/24/2018   ID:  Diane Barker, DOB [birth year], MRN [id number]  PCP:  Shon Baton, MD  Cardiologist:  No primary care provider on file.  Electrophysiologist:  None   Referring MD: Shon Baton, MD   Chief Complaint: f/u chest pain, doe  History of Present Illness:    Diane Barker is a 68 y.o. female with a hx of CAD who presents for follow up.   She is experiencing dyspnea on exertion. She feels she is limited in her ability to be active due to this. We have been medically managing a 60% prox LAD lesions since November 2019. She was unable to take beta blocker due to significant mood symptoms.  She has not required sublingual nitroglycerin for chest pain, but has not tried this for dyspnea on exertion.  The patient denies chest pain, chest pressure, dyspnea at rest, palpitations, PND, orthopnea, or leg swelling. Denies syncope or presyncope. Denies dizziness or lightheadedness. Denies snoring and has not been evaluated for sleep apnea.  Past Medical History:  Diagnosis Date  . Anxiety   . Arthritis    "all over my body" (01/15/2018)  . Breast cancer, right breast ([place]) 2000   Rt lumpectomy, Chemo/XRT/rt axillary node   . Bronchial asthma   . Chronic lower back pain   . Depression   . Diverticulosis   . DVT (deep venous thrombosis) ([place]) 06/2013   RLE  . GERD (gastroesophageal reflux disease)   . High cholesterol   . Hx of adenomatous colonic polyps   . Hypertension   . Obesity   . Osteoarthritis   . Personal history of chemotherapy   . Personal history of radiation therapy   . Pulmonary embolism ([place]) 06/2013   "both lungs"  . Spondylolisthesis     Past Surgical History:  Procedure Laterality Date  . BREAST BIOPSY Right 2000  . BREAST LUMPECTOMY Right 2000   for CA txed w/ chemo and radiation, right  . CARDIAC CATHETERIZATION    . HERNIA REPAIR    . INTRAVASCULAR PRESSURE WIRE/FFR STUDY N/A 01/18/2018   Procedure: INTRAVASCULAR  PRESSURE WIRE/FFR STUDY;  Surgeon: Burnell Blanks, MD;  Location: [facility];  Service: Cardiovascular;  Laterality: N/A;  . LEFT HEART CATH AND CORONARY ANGIOGRAPHY N/A 01/18/2018   Procedure: LEFT HEART CATH AND CORONARY ANGIOGRAPHY;  Surgeon: Burnell Blanks, MD;  Location: [facility];  Service: Cardiovascular;  Laterality: N/A;  . TUBAL LIGATION    . UMBILICAL HERNIA REPAIR  1983    Current Medications: Current Meds  Medication Sig  . acetaminophen (TYLENOL) 500 MG tablet Take 500 mg by mouth every 6 (six) hours as needed for mild pain.  Marland Kitchen atorvastatin (LIPITOR) 40 MG tablet Take 1 tablet (40 mg total) by mouth daily at 6 PM.  . budesonide-formoterol (SYMBICORT) 80-4.5 MCG/ACT inhaler Inhale 2 puffs, then rinse mouth, twice daily maintenance  . Cholecalciferol (VITAMIN D) 2000 UNITS CAPS Take 4,000 Units by mouth daily.   . clotrimazole (CLOTRIMAZOLE ANTI-FUNGAL) 1 % cream Apply 1 application topically 2 (two) times daily.  Marland Kitchen dexlansoprazole (DEXILANT) 60 MG capsule Take 60 mg by mouth daily.  Marland Kitchen escitalopram (LEXAPRO) 10 MG tablet Take 20 mg by mouth.   Marland Kitchen ipratropium-albuterol (DUONEB) 0.5-2.5 (3) MG/3ML SOLN Take 3 mLs by nebulization every 6 (six) hours as needed.  . isosorbide mononitrate (IMDUR) 30 MG 24 hr tablet Take 1 tablet (30 mg total) by mouth daily.  Marland Kitchen  nitroGLYCERIN (NITROSTAT) 0.4 MG SL tablet Place 1 tablet (0.4 mg total) under the tongue every 5 (five) minutes as needed.  Marland Kitchen PROAIR HFA 108 (90 Base) MCG/ACT inhaler Inhale 2 puffs into the lungs every 4 (four) hours as needed for wheezing or shortness of breath.   . Spacer/Aero-Holding Chambers (AEROCHAMBER MV) inhaler Use as instructed  . vitamin B-12 (CYANOCOBALAMIN) 500 MCG tablet Take 500 mcg by mouth daily.  Alveda Reasons 20 MG TABS tablet Take 1 tablet by mouth daily.  . [DISCONTINUED] aspirin EC 81 MG EC tablet Take 1 tablet (81 mg total) by mouth daily.  . [DISCONTINUED]  budesonide-formoterol (SYMBICORT) 80-4.5 MCG/ACT inhaler Inhale 2 puffs into the lungs 2 (two) times a day.  . [DISCONTINUED] ciclopirox (LOPROX) 0.77 % cream Apply 1 application topically 2 (two) times daily.     Allergies:   Erythromycin, Gabapentin, Morphine, Penicillins, and Relafen [nabumetone]   Social History   Socioeconomic History  . Marital status: Single    Spouse name: Not on file  . Number of children: 2  . Years of education: 10  . Highest education level: Not on file  Occupational History  . 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 Financial resource strain: Not on file  . Food insecurity    Worry: Not on file    Inability: Not on file  . Transportation needs    Medical: Not on file    Non-medical: Not on file  Tobacco Use  . Smoking status: Never Smoker  . Smokeless tobacco: Never Used  Substance and Sexual Activity  . Alcohol use: Never    Alcohol/week: 0.0 standard drinks    Frequency: Never  . Drug use: Never  . Sexual activity: Not Currently  Lifestyle  . Physical activity    Days per week: Not on file    Minutes per session: Not on file  . Stress: Not on file  Relationships  . Social Herbalist on phone: Not on file    Gets together: Not on file    Attends religious service: Not on file    Active member of club or organization: Not on file    Attends meetings of clubs or organizations: Not on file    Relationship status: Not on file  Other Topics Concern  . Not on file  Social History Narrative   Lives at home alone   Caffeine use- soda 16-24 oz weekly     Family History: The patient's family history includes Asthma in her daughter and mother; Breast cancer in her maternal aunt; COPD in her mother; Colon cancer in her paternal aunt; Diabetes in her brother, daughter, and mother; Heart attack in her brother and mother; Hypertension in her brother; Kidney failure in her  brother; Lung cancer in her father.  ROS:   Please see the history of present illness.    All other systems reviewed and are negative.  EKGs/Labs/Other Studies Reviewed:    The following studies were reviewed today:  EKG:  NSR, t wave flattening in v4-v6  Recent Labs: 01/15/2018: TSH 2.974 01/16/2018: ALT 10; Magnesium 2.0 01/19/2018: Hemoglobin 12.7; Platelets 210 10/14/2018: BUN 12; Creatinine, Ser 1.00; Potassium 4.2; Sodium 141  Recent Lipid Panel    Component Value Date/Time   CHOL 182 01/16/2018 0238   TRIG 60 01/16/2018 0238   HDL 46 01/16/2018 0238   CHOLHDL 4.0 01/16/2018 0238   VLDL 12  01/16/2018 0238   LDLCALC 124 (H) 01/16/2018 0238    Physical Exam:    VS:  BP 118/80   Pulse 75   Temp (!) 97.5 F (36.4 C)   Ht 5\' 5"  (1.651 m)   Wt 262 lb 9.6 oz (119.1 kg)   SpO2 95%   BMI 43.70 kg/m     Wt Readings from Last 5 Encounters:  09/24/18 262 lb 9.6 oz (119.1 kg)  07/30/18 268 lb (121.6 kg)  04/27/18 262 lb 2 oz (118.9 kg)  04/26/18 264 lb 12.8 oz (120.1 kg)  02/05/18 264 lb 6.4 oz (119.9 kg)     Constitutional: No acute distress Eyes: sclera non-icteric, normal conjunctiva and lids ENMT: normal dentition, moist mucous membranes Cardiovascular: regular rhythm, normal rate, no murmurs. S1 and S2 normal. Radial pulses normal bilaterally. No jugular venous distention.  Respiratory: clear to auscultation bilaterally GI : normal bowel sounds, soft and nontender. No distention.   MSK: extremities warm, well perfused. No edema.  NEURO: grossly nonfocal exam, moves all extremities. PSYCH: alert and oriented x 3, normal mood and affect.  ASSESSMENT:    1. Coronary artery disease involving native coronary artery of native heart without angina pectoris   2. Dyspnea on exertion   3. History of pulmonary embolus (PE)   4. History of DVT (deep vein thrombosis)   5. Gastroesophageal reflux disease, esophagitis presence not specified    PLAN:    DOE - we discussed  that I am concerned about DOE as a manifestation of stable anginal equivalent. She may also however have diffuse small vessel disease and/or contribution to dyspnea from grade 1 diastolic dysfunction with elevated LVEDP. With this information we have participated in shared decision making.  It is reasonable to consider addition of Lasix at a low dose for short period of time to see if she has benefit from modest diuresis in the setting of elevated LVEDP.  If this does not resolve her symptoms, I would like for her to consider coronary angiography with elective stenting.  She is agreeable to this plan, and we will follow-up in approximately 1 month.  CAD- continue aspirin, Imdur, atorvastatin  History of pulmonary embolism- continue Xarelto.  Can consider dropping aspirin if she does not require intervention for LAD disease at our next visit.  TIME SPENT WITH PATIENT: 25 minutes of direct patient care. More than 50% of that time was spent on coordination of care and counseling regarding CAD, and diastolic dysfunction.  Cherlynn Kaiser, MD [place]  [facility]   Medication Adjustments/Labs and Tests Ordered: Current medicines are reviewed at length with the patient today.  Concerns regarding medicines are outlined above.  Orders Placed This Encounter  Procedures  . EKG 12-Lead   Meds ordered this encounter  Medications  . furosemide (LASIX) 20 MG tablet    Sig: Take 1 tablet (20 mg total) by mouth daily.    Dispense:  30 tablet    Refill:  6    Patient Instructions  Medication Instructions:   Start lasix ( furosemide ) 20 mg  Take one tablet daily ( preferably in the morning)   CONTACT  THE OFFICE IN ABOUT 10 DAYS - TO LET DR Margaretann Loveless KNOW HOW  THE MEDICATION IS WORKING.  If you need a refill on your cardiac medications before your next appointment, please call your pharmacy.   Lab work:  Not needed.  Testing/Procedures: Not needed  Follow-Up: At [facility], you  and your health needs  are our priority.  As part of our continuing mission to provide you with exceptional heart care, we have created designated Provider Care Teams.  These Care Teams include your primary Cardiologist (physician) and Advanced Practice Providers (APPs -  Physician Assistants and Nurse Practitioners) who all work together to provide you with the care you need, when you need it. . You will need a follow up appointment in  July 29 ,2020.  Please call our office 2 months in advance to schedule this appointment.  You may see Elouise Munroe, MD*or one of the following Advanced Practice Providers on your designated Care Team:   . Rosaria Ferries, PA-C . Jory Sims, DNP, ANP  Any Other Special Instructions Will Be Listed Below (If Applicable).

## 2018-10-13 ENCOUNTER — Ambulatory Visit: Payer: 59 | Admitting: Internal Medicine

## 2018-10-15 LAB — BASIC METABOLIC PANEL
BUN/Creatinine Ratio: 12 (ref 12–28)
BUN: 12 mg/dL (ref 8–27)
CO2: 24 mmol/L (ref 20–29)
Calcium: 8.8 mg/dL (ref 8.7–10.3)
Chloride: 103 mmol/L (ref 96–106)
Creatinine, Ser: 1 mg/dL (ref 0.57–1.00)
GFR calc Af Amer: 66 mL/min/{1.73_m2} (ref >59)
GFR calc non Af Amer: 58 mL/min/{1.73_m2} — ABNORMAL LOW (ref >59)
Glucose: 114 mg/dL — ABNORMAL HIGH (ref 65–99)
Potassium: 4.2 mmol/L (ref 3.5–5.2)
Sodium: 141 mmol/L (ref 134–144)

## 2018-10-18 ENCOUNTER — Encounter: Payer: Self-pay | Admitting: *Deleted

## 2018-11-15 ENCOUNTER — Ambulatory Visit: Payer: 59

## 2018-12-23 ENCOUNTER — Other Ambulatory Visit: Payer: Self-pay | Admitting: Internal Medicine

## 2018-12-29 ENCOUNTER — Other Ambulatory Visit: Payer: Self-pay

## 2018-12-29 ENCOUNTER — Ambulatory Visit (INDEPENDENT_AMBULATORY_CARE_PROVIDER_SITE_OTHER): Payer: 59 | Admitting: Physician Assistant

## 2018-12-29 ENCOUNTER — Ambulatory Visit: Payer: Self-pay

## 2018-12-29 ENCOUNTER — Encounter: Payer: Self-pay | Admitting: Physician Assistant

## 2018-12-29 DIAGNOSIS — M1711 Unilateral primary osteoarthritis, right knee: Secondary | ICD-10-CM | POA: Diagnosis not present

## 2018-12-29 MED ORDER — LIDOCAINE HCL 1 % IJ SOLN
3.0000 mL | INTRAMUSCULAR | Status: AC | PRN
  Administered 2018-12-29: 16:00:00 3 mL

## 2018-12-29 MED ORDER — METHYLPREDNISOLONE ACETATE 40 MG/ML IJ SUSP
40.0000 mg | INTRAMUSCULAR | Status: AC | PRN
  Administered 2018-12-29: 16:00:00 40 mg via INTRA_ARTICULAR

## 2018-12-29 NOTE — Progress Notes (Signed)
Office Visit Note   Patient: Diane Barker           Date of Birth: [birth year]           MRN: [id number] Visit Date: 12/29/2018              Requested by: Shon Baton, [address],  [place] [address] PCP: Shon Baton, MD   Assessment & Plan: Visit Diagnoses:  1. Primary osteoarthritis of right knee     Plan:  We will see her back in a month and see how she does with the injection.  She does work on Forensic scientist as shown.   Follow-Up Instructions: Return in about 4 weeks (around 01/26/2019).   Orders:  Orders Placed This Encounter  Procedures  . Large Joint Inj: R knee  . XR Knee 1-2 Views Right   No orders of the defined types were placed in this encounter.     Procedures: Large Joint Inj: R knee on 12/29/2018 4:05 PM Indications: pain Details: 22 G 1.5 in needle, anterolateral approach  Arthrogram: No  Medications: 3 mL lidocaine 1 %; 40 mg methylPREDNISolone acetate 40 MG/ML Outcome: tolerated well, no immediate complications Procedure, treatment alternatives, risks and benefits explained, specific risks discussed. Consent was given by the patient. Immediately prior to procedure a time out was called to verify the correct patient, procedure, equipment, support staff and site/side marked as required. Patient was prepped and draped in the usual sterile fashion.       Clinical Data: No additional findings.   Subjective: Chief Complaint  Patient presents with  . Right Knee - Pain    HPI Diane Barker is well-known to Dr. Criss Rosales service comes in today due to last night she was walking along and had pain in her right knee after stepping in a hole.  She is having swelling in the knee pain with ambulating.  Difficulty sleeping.  No other injury. Review of Systems Please see HPI otherwise negative  Objective: Vital Signs: There were no vitals taken for this visit.  Physical Exam General: Well-developed well-nourished female no acute distress.  Ortho Exam Bilateral knees good range of motion.  No abnormal warmth erythema or effusion no instability valgus varus stressing.  Tenderness along medial joint line of the right knee only.  Left knee considerable patellofemoral crepitus. Specialty Comments:  No specialty comments available.  Imaging: Xr Knee 1-2 Views Right  Result Date: 12/29/2018 Right knee 2 views.  No acute fracture.  Tricompartmental changes with moderate medial joint line narrowing mild lateral compartmental narrowing.  Moderate to severe patellofemoral changes.  Wound is well located.  No bony abnormalities otherwise.    PMFS History: Patient Active Problem List   Diagnosis Date Noted  . AB (asthmatic bronchitis), mild persistent, with acute exacerbation   . Unstable angina ([place]) 01/16/2018  . Primary osteoarthritis of both knees 07/29/2017  . Cellulitis of right arm 10/07/2016  . Sepsis ([place]) 10/07/2016  . Chest pain 10/07/2016  . Dyspnea on exertion 05/24/2015  . DVT, lower extremity ([place]) 05/09/2014  . Right calf pain 06/21/2013  . Pulmonary embolism ([place]) 06/21/2013  . Dysphagia 06/27/2011  . Cough 06/27/2011  . CHEST PAIN 01/18/2009  . RHINITIS 04/13/2007  . Obstructive chronic bronchitis with acute bronchitis ([place]) 04/13/2007  . GERD 04/13/2007  . Depression 01/21/2007  . OSTEOARTHRITIS 01/21/2007  . LOW BACK PAIN 01/21/2007  . BREAST CANCER, HX OF 01/21/2007   Past Medical History:  Diagnosis Date  .  Anxiety   . Arthritis    "all over my body" (01/15/2018)  . Breast cancer, right breast ([place]) 2000   Rt lumpectomy, Chemo/XRT/rt axillary node   . Bronchial asthma   . Chronic lower back pain   . Depression   . Diverticulosis   . DVT (deep venous thrombosis) ([facility]) 06/2013   RLE  . GERD (gastroesophageal reflux disease)   . High cholesterol   . Hx of adenomatous colonic polyps   . Hypertension   . Obesity   . Osteoarthritis   . Personal history of chemotherapy   . Personal history of  radiation therapy   . Pulmonary embolism ([place]) 06/2013   "both lungs"  . Spondylolisthesis     Family History  Problem Relation Age of Onset  . Asthma Mother   . COPD Mother   . Heart attack Mother   . Diabetes Mother   . Lung cancer Father   . Asthma Daughter   . Kidney failure Brother   . Diabetes Brother        x 2  . Heart attack Brother   . Breast cancer Maternal Aunt   . Colon cancer Paternal Aunt   . Diabetes Daughter        x 2  . Hypertension Brother     Past Surgical History:  Procedure Laterality Date  . BREAST BIOPSY Right 2000  . BREAST LUMPECTOMY Right 2000   for CA txed w/ chemo and radiation, right  . CARDIAC CATHETERIZATION    . HERNIA REPAIR    . INTRAVASCULAR PRESSURE WIRE/FFR STUDY N/A 01/18/2018   Procedure: INTRAVASCULAR PRESSURE WIRE/FFR STUDY;  Surgeon: Burnell Blanks, MD;  Location: [facility];  Service: Cardiovascular;  Laterality: N/A;  . LEFT HEART CATH AND CORONARY ANGIOGRAPHY N/A 01/18/2018   Procedure: LEFT HEART CATH AND CORONARY ANGIOGRAPHY;  Surgeon: Burnell Blanks, MD;  Location: [facility];  Service: Cardiovascular;  Laterality: N/A;  . TUBAL LIGATION    . [place]   Social History   Occupational History  . 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 Smoking status: Never Smoker  . Smokeless tobacco: Never Used  Substance and Sexual Activity  . Alcohol use: Never    Alcohol/week: 0.0 standard drinks    Frequency: Never  . Drug use: Never  . Sexual activity: Not Currently

## 2018-12-31 ENCOUNTER — Other Ambulatory Visit: Payer: Self-pay | Admitting: Internal Medicine

## 2018-12-31 MED ORDER — FUROSEMIDE 20 MG PO TABS: 20.0000 mg | ORAL_TABLET | Freq: Every day | ORAL | 1 refills | Status: DC

## 2018-12-31 NOTE — Telephone Encounter (Signed)
°*  STAT* If patient is at the pharmacy, call can be transferred to refill team.   1. Which medications need to be refilled? (please list name of each medication and dose if known) Furosemide   2. Which pharmacy/location (including street and city if local pharmacy) is [facility]  3. Do they need a 30 day or 90 day supply? Not sure think 90

## 2018-12-31 NOTE — Telephone Encounter (Signed)
LVM letting patient know that I refilled her Lasix.

## 2019-01-10 ENCOUNTER — Ambulatory Visit: Payer: 59 | Admitting: Internal Medicine

## 2019-01-14 ENCOUNTER — Other Ambulatory Visit (HOSPITAL_COMMUNITY): Payer: Self-pay | Admitting: *Deleted

## 2019-01-17 ENCOUNTER — Ambulatory Visit (HOSPITAL_COMMUNITY)
Admission: RE | Admit: 2019-01-17 | Discharge: 2019-01-17 | Disposition: A | Discharge status: Home / Self Care | Payer: 59 | Source: Ambulatory Visit | Attending: Internal Medicine | Admitting: Internal Medicine

## 2019-01-17 ENCOUNTER — Other Ambulatory Visit: Payer: Self-pay

## 2019-01-17 DIAGNOSIS — M810 Age-related osteoporosis without current pathological fracture: Secondary | ICD-10-CM | POA: Insufficient documentation

## 2019-01-17 MED ORDER — ZOLEDRONIC ACID 5 MG/100ML IV SOLN
5.0000 mg | Freq: Once | INTRAVENOUS | Status: AC
  Administered 2019-01-17: 09:00:00 5 mg via INTRAVENOUS

## 2019-01-17 MED ORDER — ZOLEDRONIC ACID 5 MG/100ML IV SOLN
INTRAVENOUS | Status: AC
  Filled 2019-01-17: qty 100

## 2019-01-26 ENCOUNTER — Ambulatory Visit: Payer: 59 | Admitting: Physician Assistant

## 2019-03-02 ENCOUNTER — Other Ambulatory Visit: Payer: Self-pay | Admitting: Internal Medicine

## 2019-04-11 ENCOUNTER — Other Ambulatory Visit: Payer: Self-pay | Admitting: Internal Medicine

## 2019-04-11 DIAGNOSIS — N644 Mastodynia: Secondary | ICD-10-CM

## 2019-05-03 ENCOUNTER — Ambulatory Visit
Admission: RE | Admit: 2019-05-03 | Discharge: 2019-05-03 | Disposition: A | Discharge status: Home / Self Care | Payer: 59 | Source: Ambulatory Visit | Attending: Internal Medicine | Admitting: Internal Medicine

## 2019-05-03 ENCOUNTER — Other Ambulatory Visit: Payer: Self-pay

## 2019-05-03 ENCOUNTER — Ambulatory Visit: Payer: 59

## 2019-05-03 DIAGNOSIS — N644 Mastodynia: Secondary | ICD-10-CM

## 2019-05-04 ENCOUNTER — Other Ambulatory Visit: Payer: Self-pay | Admitting: Internal Medicine

## 2019-05-04 NOTE — Telephone Encounter (Signed)
°*  STAT* If patient is at the pharmacy, call can be transferred to refill team.   1. Which medications need to be refilled? (please list name of each medication and dose if known) isosorbide mononitrate (IMDUR) 30 MG 24 hr tablet  2. Which pharmacy/location (including street and city if local pharmacy) is medication to be sent to? [facility] phone # [phone]  3. Do they need a 30 day or 90 day supply? 90 day  Patient is out of medication

## 2019-05-05 MED ORDER — ISOSORBIDE MONONITRATE ER 30 MG PO TB24: 30.0000 mg | ORAL_TABLET | Freq: Every day | ORAL | 1 refills | Status: DC

## 2019-05-19 ENCOUNTER — Ambulatory Visit: Payer: 59 | Admitting: Physician Assistant

## 2019-05-23 ENCOUNTER — Other Ambulatory Visit: Payer: Self-pay

## 2019-05-23 ENCOUNTER — Encounter: Payer: Self-pay | Admitting: Physician Assistant

## 2019-05-23 ENCOUNTER — Ambulatory Visit: Payer: Self-pay

## 2019-05-23 ENCOUNTER — Ambulatory Visit (INDEPENDENT_AMBULATORY_CARE_PROVIDER_SITE_OTHER): Payer: 59 | Admitting: Physician Assistant

## 2019-05-23 DIAGNOSIS — M79605 Pain in left leg: Secondary | ICD-10-CM

## 2019-05-23 MED ORDER — METHYLPREDNISOLONE 4 MG PO TABS: ORAL_TABLET | ORAL | 0 refills | Status: DC

## 2019-05-23 NOTE — Progress Notes (Signed)
Office Visit Note   Patient: Diane Barker           Date of Birth: November 24, 1950           MRN: [id number] Visit Date: 05/23/2019              Requested by: Shon Baton, [address],  [place] [address] PCP: Shon Baton, MD   Assessment & Plan: Visit Diagnoses:  1. Pain in left leg     Plan: Syndrome physical therapy for back and this will include home exercise program stretching modalities.  Placed her on Medrol Dosepak.  No NSAIDs while on the this which she was unable to take NSAIDs due to being on chronic Xarelto.  She can take Tylenol for pain.  See her back in 4 weeks to see what type of response she has been.  Questions were encouraged and answered  Follow-Up Instructions: Return in about 4 weeks (around 06/20/2019).   Orders:  Orders Placed This Encounter  Procedures  . XR Lumbar Spine 2-3 Views   Meds ordered this encounter  Medications  . methylPREDNISolone (MEDROL) 4 MG tablet    Sig: Take as directed    Dispense:  21 tablet    Refill:  0      Procedures: No procedures performed   Clinical Data: No additional findings.   Subjective: Chief Complaint  Patient presents with  . Left Lower Leg - Pain    HPI Diane Barker comes in today due to left leg pain.  Mostly in the lower leg over the last 3 weeks but sometimes pain in the involves the left thigh.  She is having no back pain.  She describes the pain is to think throbbing pain is worse with sitting for long periods time in car.  She states today she is overall doing well.  She does have some pain about the knee and feels knee can lock up at times.  No known injury.  She does have a tricompartmental changes of the left knee based on prior x-rays.  She is having no bowel bladder dysfunction, saddle anesthesia, waking pain, fevers or chills.  She been taking Tylenol for the pain. Review of Systems See HPI  Objective: Vital Signs: There were no vitals taken for this visit.  Physical Exam General:  Well-developed well-nourished female no acute distress. Psych: Alert and oriented x3 Vascular: Bilateral calves supple nontender dorsal pedal pulses are intact bilaterally. Ortho Exam Lower extremities 5/5 strength throughout.  Negative straight leg raise bilaterally.  Good range of motion bilateral hips bilateral knees without pain.  Nontender to palpation over both knees medial lateral joint lines.  Tenderness over the trochanteric region both hips.  Specialty Comments:  No specialty comments available.  Imaging: XR Lumbar Spine 2-3 Views  Result Date: 05/23/2019 Lumbar spine AP lateral views shows an unchanged grade 1 L5 on S1 anterior spondylolisthesis with pars defect.  The spondylolisthesis at L5-S1 appears unchanged from prior films 2009.  The space narrowing at T12-L1 with anterior endplate osteophytes.  No acute fractures.Marland Kitchen    PMFS History: Patient Active Problem List   Diagnosis Date Noted  . AB (asthmatic bronchitis), mild persistent, with acute exacerbation   . Unstable angina ([place]) 01/16/2018  . Primary osteoarthritis of both knees 07/29/2017  . Cellulitis of right arm 10/07/2016  . Sepsis ([place]) 10/07/2016  . Chest pain 10/07/2016  . Dyspnea on exertion 05/24/2015  . DVT, lower extremity ([place]) 05/09/2014  . Right calf  pain 06/21/2013  . Pulmonary embolism ([place]) 06/21/2013  . Dysphagia 06/27/2011  . Cough 06/27/2011  . CHEST PAIN 01/18/2009  . RHINITIS 04/13/2007  . Obstructive chronic bronchitis with acute bronchitis ([place]) 04/13/2007  . GERD 04/13/2007  . Depression 01/21/2007  . OSTEOARTHRITIS 01/21/2007  . LOW BACK PAIN 01/21/2007  . BREAST CANCER, HX OF 01/21/2007   Past Medical History:  Diagnosis Date  . Anxiety   . Arthritis    "all over my body" (01/15/2018)  . Breast cancer, right breast ([place]) 2000   Rt lumpectomy, Chemo/XRT/rt axillary node   . Bronchial asthma   . Chronic lower back pain   . Depression   . Diverticulosis   . DVT (deep venous  thrombosis) ([place]) 06/2013   RLE  . GERD (gastroesophageal reflux disease)   . High cholesterol   . Hx of adenomatous colonic polyps   . Hypertension   . Obesity   . Osteoarthritis   . Personal history of chemotherapy   . Personal history of radiation therapy   . Pulmonary embolism ([place]) 06/2013   "both lungs"  . Spondylolisthesis     Family History  Problem Relation Age of Onset  . Asthma Mother   . COPD Mother   . Heart attack Mother   . Diabetes Mother   . Lung cancer Father   . Asthma Daughter   . Kidney failure Brother   . Diabetes Brother        x 2  . Heart attack Brother   . Breast cancer Maternal Aunt   . Colon cancer Paternal Aunt   . Diabetes Daughter        x 2  . Hypertension Brother     Past Surgical History:  Procedure Laterality Date  . BREAST BIOPSY Right 2000  . BREAST LUMPECTOMY Right 2000   for CA txed w/ chemo and radiation, right  . CARDIAC CATHETERIZATION    . HERNIA REPAIR    . INTRAVASCULAR PRESSURE WIRE/FFR STUDY N/A 01/18/2018   Procedure: INTRAVASCULAR PRESSURE WIRE/FFR STUDY;  Surgeon: Burnell Blanks, MD;  Location: [facility];  Service: Cardiovascular;  Laterality: N/A;  . LEFT HEART CATH AND CORONARY ANGIOGRAPHY N/A 01/18/2018   Procedure: LEFT HEART CATH AND CORONARY ANGIOGRAPHY;  Surgeon: Burnell Blanks, MD;  Location: [facility];  Service: Cardiovascular;  Laterality: N/A;  . TUBAL LIGATION    . [place]   Social History   Occupational History  . 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 Smoking status: Never Smoker  . Smokeless tobacco: Never Used  Substance and Sexual Activity  . Alcohol use: Never    Alcohol/week: 0.0 standard drinks  . Drug use: Never  . Sexual activity: Not Currently

## 2019-05-23 NOTE — Addendum Note (Signed)
Addended by: Michae Kava B on: 05/23/2019 10:59 AM   Modules accepted: Orders

## 2019-05-30 ENCOUNTER — Telehealth: Payer: Self-pay | Admitting: Physician Assistant

## 2019-05-30 NOTE — Telephone Encounter (Signed)
Patient called advised she went to the ER in [facility] Friday night 05/27/2019. Patient said she can not put any weight down on her left leg. Patient said the left knee is swollen. The number to contact patient is [phone]

## 2019-05-30 NOTE — Telephone Encounter (Signed)
Can we put her on the schedule? If nothing today we can work her in tomorrow morning

## 2019-05-31 ENCOUNTER — Ambulatory Visit (INDEPENDENT_AMBULATORY_CARE_PROVIDER_SITE_OTHER): Payer: 59 | Admitting: Orthopaedic Surgery

## 2019-05-31 ENCOUNTER — Other Ambulatory Visit: Payer: Self-pay

## 2019-05-31 ENCOUNTER — Ambulatory Visit: Payer: Self-pay

## 2019-05-31 ENCOUNTER — Encounter: Payer: Self-pay | Admitting: Orthopaedic Surgery

## 2019-05-31 DIAGNOSIS — M79605 Pain in left leg: Secondary | ICD-10-CM | POA: Diagnosis not present

## 2019-05-31 MED ORDER — METHYLPREDNISOLONE ACETATE 40 MG/ML IJ SUSP
40.0000 mg | INTRAMUSCULAR | Status: AC | PRN
  Administered 2019-05-31: 40 mg via INTRA_ARTICULAR

## 2019-05-31 MED ORDER — LIDOCAINE HCL 1 % IJ SOLN
3.0000 mL | INTRAMUSCULAR | Status: AC | PRN
  Administered 2019-05-31: 3 mL

## 2019-05-31 NOTE — Progress Notes (Signed)
Office Visit Note   Patient: Diane Barker           Date of Birth: [birth year]           MRN: [id number] Visit Date: 05/31/2019              Requested by: Shon Baton, [address],  [place] [address] PCP: Shon Baton, MD   Assessment & Plan: Visit Diagnoses:  1. Pain in left leg     Plan: I felt it was reasonable to try a steroid injection in her left knee today.  She tolerated this well.  She will participate in physical therapy hopefully starting next week.  If things worsen she will let us know.  She will keep her regular follow-up that is already established.  Follow-Up Instructions: Return in about 4 weeks (around 06/28/2019).   Orders:  Orders Placed This Encounter  Procedures  . Large Joint Inj  . XR Knee 1-2 Views Left   No orders of the defined types were placed in this encounter.     Procedures: Large Joint Inj: L knee on 05/31/2019 10:19 AM Indications: diagnostic evaluation and pain Details: 22 G 1.5 in needle, superolateral approach  Arthrogram: No  Medications: 3 mL lidocaine 1 %; 40 mg methylPREDNISolone acetate 40 MG/ML Outcome: tolerated well, no immediate complications Procedure, treatment alternatives, risks and benefits explained, specific risks discussed. Consent was given by the patient. Immediately prior to procedure a time out was called to verify the correct patient, procedure, equipment, support staff and site/side marked as required. Patient was prepped and draped in the usual sterile fashion.       Clinical Data: No additional findings.   Subjective: Chief Complaint  Patient presents with  . Left Knee - Pain, Edema  The patient comes in for continued follow-up dealing with severe left lower extremity pain.  She actually saw Artis Delay last week and he appropriately put her on a 6-day steroid taper and set her up for outpatient physical therapy for her spine.  She then went to the emergency room this past Friday evening with  worsening pain.  They obtained a Doppler ultrasound of her left lower extremity was negative for DVT.  They also obtain plain films of her left knee that showed no acute findings but tricompartment arthritic changes.  She still complains of lower extremity pain from her hip down to her ankle but she states is mainly at her knee.  She states the steroid taper did not help.  She did take 2 hydrocodone this morning which she says that is helped.  HPI  Review of Systems Today she denies any headache, chest pain, shortness of breath, fever, chills, nausea, vomiting  Objective: Vital Signs: There were no vitals taken for this visit.  Physical Exam She is alert and orient x3 and in no acute distress.  She is ambulate with a walker very slowly. Ortho Exam Examination of her left lower extremity shows her calf is soft.  Her left hip and left knee move smoothly and fluidly as is her left ankle.  There is no gross abnormalities or specific areas of pain other than left lower extremity pain.  She feels like is more of her knee today. Specialty Comments:  No specialty comments available.  Imaging: XR Knee 1-2 Views Left  Result Date: 05/31/2019 2 views of the left knee show no acute findings.  There is evidence of tricompartment arthritic changes with severe disease of the patellofemoral joint  and moderate joint space narrowing of the medial compartment.    PMFS History: Patient Active Problem List   Diagnosis Date Noted  . AB (asthmatic bronchitis), mild persistent, with acute exacerbation   . Unstable angina ([place]) 01/16/2018  . Primary osteoarthritis of both knees 07/29/2017  . Cellulitis of right arm 10/07/2016  . Sepsis ([place]) 10/07/2016  . Chest pain 10/07/2016  . Dyspnea on exertion 05/24/2015  . DVT, lower extremity ([place]) 05/09/2014  . Right calf pain 06/21/2013  . Pulmonary embolism ([place]) 06/21/2013  . Dysphagia 06/27/2011  . Cough 06/27/2011  . CHEST PAIN 01/18/2009  . RHINITIS  04/13/2007  . Obstructive chronic bronchitis with acute bronchitis ([place]) 04/13/2007  . GERD 04/13/2007  . Depression 01/21/2007  . OSTEOARTHRITIS 01/21/2007  . LOW BACK PAIN 01/21/2007  . BREAST CANCER, HX OF 01/21/2007   Past Medical History:  Diagnosis Date  . Anxiety   . Arthritis    "all over my body" (01/15/2018)  . Breast cancer, right breast ([place]) 2000   Rt lumpectomy, Chemo/XRT/rt axillary node   . Bronchial asthma   . Chronic lower back pain   . Depression   . Diverticulosis   . DVT (deep venous thrombosis) ([place]) 06/2013   RLE  . GERD (gastroesophageal reflux disease)   . High cholesterol   . Hx of adenomatous colonic polyps   . Hypertension   . Obesity   . Osteoarthritis   . Personal history of chemotherapy   . Personal history of radiation therapy   . Pulmonary embolism ([place]) 06/2013   "both lungs"  . Spondylolisthesis     Family History  Problem Relation Age of Onset  . Asthma Mother   . COPD Mother   . Heart attack Mother   . Diabetes Mother   . Lung cancer Father   . Asthma Daughter   . Kidney failure Brother   . Diabetes Brother        x 2  . Heart attack Brother   . Breast cancer Maternal Aunt   . Colon cancer Paternal Aunt   . Diabetes Daughter        x 2  . Hypertension Brother     Past Surgical History:  Procedure Laterality Date  . BREAST BIOPSY Right 2000  . BREAST LUMPECTOMY Right 2000   for CA txed w/ chemo and radiation, right  . CARDIAC CATHETERIZATION    . HERNIA REPAIR    . INTRAVASCULAR PRESSURE WIRE/FFR STUDY N/A 01/18/2018   Procedure: INTRAVASCULAR PRESSURE WIRE/FFR STUDY;  Surgeon: Burnell Blanks, MD;  Location: [facility];  Service: Cardiovascular;  Laterality: N/A;  . LEFT HEART CATH AND CORONARY ANGIOGRAPHY N/A 01/18/2018   Procedure: LEFT HEART CATH AND CORONARY ANGIOGRAPHY;  Surgeon: Burnell Blanks, MD;  Location: [facility];  Service: Cardiovascular;  Laterality: N/A;  . TUBAL LIGATION     . [place]   Social History   Occupational History  . 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 Smoking status: Never Smoker  . Smokeless tobacco: Never Used  Substance and Sexual Activity  . Alcohol use: Never    Alcohol/week: 0.0 standard drinks  . Drug use: Never  . Sexual activity: Not Currently

## 2019-06-06 ENCOUNTER — Other Ambulatory Visit: Payer: Self-pay | Admitting: Internal Medicine

## 2019-06-06 MED ORDER — ATORVASTATIN CALCIUM 40 MG PO TABS: 40.0000 mg | ORAL_TABLET | Freq: Every day | ORAL | 0 refills | Status: DC

## 2019-06-06 NOTE — Telephone Encounter (Signed)
*  STAT* If patient is at the pharmacy, call can be transferred to refill team.   1. Which medications need to be refilled? (please list name of each medication and dose if known) atorvastatin (LIPITOR) 40 MG tablet  2. Which pharmacy/location (including street and city if local pharmacy) is medication to be sent to? [id number] [place] [place] [facility] [address]  3. Do they need a 30 day or 90 day supply? 90 day supply

## 2019-06-06 NOTE — Telephone Encounter (Signed)
One month supply of Atorvastatin sent in. Patient needs an appointment for refills.

## 2019-06-07 ENCOUNTER — Other Ambulatory Visit: Payer: Self-pay

## 2019-06-07 ENCOUNTER — Encounter: Payer: Self-pay | Admitting: Physical Therapy

## 2019-06-07 ENCOUNTER — Ambulatory Visit: Payer: 59 | Attending: Physician Assistant | Admitting: Physical Therapy

## 2019-06-07 DIAGNOSIS — M6281 Muscle weakness (generalized): Secondary | ICD-10-CM | POA: Diagnosis present

## 2019-06-07 DIAGNOSIS — M79605 Pain in left leg: Secondary | ICD-10-CM | POA: Insufficient documentation

## 2019-06-07 DIAGNOSIS — R2689 Other abnormalities of gait and mobility: Secondary | ICD-10-CM | POA: Diagnosis present

## 2019-06-07 DIAGNOSIS — M5417 Radiculopathy, lumbosacral region: Secondary | ICD-10-CM | POA: Diagnosis present

## 2019-06-07 NOTE — Therapy (Addendum)
[facility] [place], [place], [address] Phone: [phone]   Fax:  [phone]  Physical Therapy Treatment/Discharge  Patient Details  Name: Diane Barker MRN: [id number] Date of Birth: June 15, 1950 Referring Provider (PT): Dr. Jean Rosenthal, Erskine Emery, PA    Encounter Date: 06/07/2019  PT End of Session - 06/07/19 1552    Visit Number  1    Number of Visits  12    Date for PT Re-Evaluation  07/19/19    Authorization Type  [employer] MCR/MCD    PT Start Time  [id number]    PT Stop Time  1530    PT Time Calculation (min)  45 min    Activity Tolerance  Patient tolerated treatment well    Behavior During Therapy  [facility] for tasks assessed/performed       Past Medical History:  Diagnosis Date  . Anxiety   . Arthritis    "all over my body" (01/15/2018)  . Breast cancer, right breast ([place]) 2000   Rt lumpectomy, Chemo/XRT/rt axillary node   . Bronchial asthma   . Chronic lower back pain   . Depression   . Diverticulosis   . DVT (deep venous thrombosis) ([place]) 06/2013   RLE  . GERD (gastroesophageal reflux disease)   . High cholesterol   . Hx of adenomatous colonic polyps   . Hypertension   . Obesity   . Osteoarthritis   . Personal history of chemotherapy   . Personal history of radiation therapy   . Pulmonary embolism ([place]) 06/2013   "both lungs"  . Spondylolisthesis     Past Surgical History:  Procedure Laterality Date  . BREAST BIOPSY Right 2000  . BREAST LUMPECTOMY Right 2000   for CA txed w/ chemo and radiation, right  . CARDIAC CATHETERIZATION    . HERNIA REPAIR    . INTRAVASCULAR PRESSURE WIRE/FFR STUDY N/A 01/18/2018   Procedure: INTRAVASCULAR PRESSURE WIRE/FFR STUDY;  Surgeon: Burnell Blanks, MD;  Location: [facility];  Service: Cardiovascular;  Laterality: N/A;  . LEFT HEART CATH AND CORONARY ANGIOGRAPHY N/A 01/18/2018   Procedure: LEFT HEART CATH AND CORONARY ANGIOGRAPHY;  Surgeon:  Burnell Blanks, MD;  Location: [facility];  Service: Cardiovascular;  Laterality: N/A;  . TUBAL LIGATION    . UMBILICAL HERNIA REPAIR  1983    There were no vitals filed for this visit.  Subjective Assessment - 06/07/19 1457    Subjective  Patient reports increased pain in LLE over the past year.  It has been this bad about 4-5 weeks.  She recently got an injection in her L knee and it did help.  She denies back pain right now but has had intermittent over past years.  Sometimes the pain can radiate to her hip and/or down her shin.  She feels weak in her LLE at times.    Pertinent History  DVT, OA "all over" , depression, cardiac cath    Limitations  Standing;Walking;House hold activities;Lifting    How long can you sit comfortably?  pain severe in leg with driving 45 min    How long can you stand comfortably?  10 min or less    Diagnostic tests  XR L spine  unchanged grade 1 L5 on S1 anterior spondylolisthesis with pars defect.  The spondylolisthesis at L5-S1 appears unchanged from prior films 2009. and L knee tricompartment arthritic changes with severe disease of the patellofemoral joint and moderate joint space narrowing of the  medial compartment.    Patient Stated Goals  Pain relief    Currently in Pain?  Yes    Pain Score  5     Pain Location  Leg    Pain Orientation  Left;Distal;Lateral    Pain Descriptors / Indicators  Burning;Throbbing    Pain Type  Chronic pain    Pain Radiating Towards  hip    Pain Onset  More than a month ago    Pain Frequency  Intermittent    Aggravating Factors   sitting too long, stand too long    Pain Relieving Factors  changing positions, injection    Effect of Pain on Daily Activities  hard to sleep, walk, do housework    Multiple Pain Sites  No         [facility] PT Assessment - 06/07/19 0001      Assessment   Medical Diagnosis  LLE pain     Referring Provider (PT)  Dr. Jean Rosenthal, Erskine Emery, PA     Onset Date/Surgical  Date  --   1 yr    Prior Therapy  Yes long ago for back       Precautions   Precautions  None      Restrictions   Weight Bearing Restrictions  No      Balance Screen   Has the patient fallen in the past 6 months  No   my balance is "OFF"   Has the patient had a decrease in activity level because of a fear of falling?   Yes    Is the patient reluctant to leave their home because of a fear of falling?   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  Auction      Cognition   Overall Cognitive Status  Within Functional Limits for tasks assessed      Observation/Other Assessments   Skin Integrity       Focus on Therapeutic Outcomes (FOTO)   42%      Sensation   Light Touch  Appears Intact      Posture/Postural Control   Posture/Postural Control  Postural limitations    Postural Limitations  Rounded Shoulders;Forward head;Increased thoracic kyphosis;Flexed trunk      AROM   Right Knee Flexion  120    Left Knee Flexion  105    Lumbar Flexion  WFL    Lumbar Extension  50% increased leg pain     Lumbar - Right Side Bend  pain 25%    Lumbar - Left Side Bend  pain, L side, 25%     Lumbar - Right Rotation  50%    Lumbar - Left Rotation  50%      PROM   Overall PROM Comments  hip tight internal rotation bilateral       Strength   Right Hip Flexion  4/5  Left Hip Flexion  4/5    Right Knee Flexion  5/5    Right Knee Extension  5/5    Left Knee Flexion  5/5    Left Knee Extension  5/5    Right Ankle Dorsiflexion  4/5    Left Ankle Dorsiflexion  4/5       Palpation   Palpation comment  non tender in back, hip and lateral L thigh, knee and ant . tib.       Ambulation/Gait   Ambulation Distance (Feet)  150 Feet    Assistive device  None    Gait Pattern  Step-through pattern;Decreased stance time - left;Decreased hip/knee flexion - left;Antalgic    Ambulation Surface  Level;Indoor         PT Education - 06/07/19 1552    Education Details  PT/POC, back pain and referral to LE, spondylolisthesis    Person(s) Educated  Patient    Methods  Explanation;Demonstration;Handout    Comprehension  Verbalized understanding;Returned demonstration          PT Long Term Goals - 06/07/19 1553      PT LONG TERM GOAL #1   Title  Pt will be I with HEP for core, hips and knee.    Time  6    Period  Weeks    Status  New    Target Date  07/19/19      PT LONG TERM GOAL #2   Title  Pt will be able to walk without limp in the clinic with cues due to less pain in LLE    Time  6    Period  Weeks    Status  New    Target Date  07/19/19      PT LONG TERM GOAL #3   Title  Pt will be able to drive to PT with pain in leg no more than 3/10 (45 min)    Time  6    Period  Weeks    Status  New    Target Date  07/19/19      PT LONG TERM GOAL #4   Title  Pt will be able to stand at the sink to wash dishes for 10 min without increased leg pain    Time  6    Period  Weeks    Status  New    Target Date  07/19/19      PT LONG TERM GOAL #5   Title  FOTO will improve to 34% or less impaired    Time  6    Period  Weeks    Status  New    Target Date  07/19/19            Plan - 06/07/19 1558    Clinical Impression Statement  Pt presents for mod complexity eval of leg pain which has been worsening over the past 4-5 weeks.  She has had some improvement with cortisone injection.  Her symptoms were reproduced with standing extension of lumbar spine.  Back pain increased with trunk motions, all positions other than flexion.  She has decreased L knee ROM  which limits her mobility as well. She has good LE strength but weakness in hips, core.  A program to strengthen and increase physcial activity will likely improve her condition a good degree.    Personal Factors and Comorbidities  Comorbidity 1;Comorbidity 3+;Past/Current Experience    Comorbidities  obesity, HTN, OA , asthma, cardiac    Examination-Activity Limitations  Stairs;Squat;Sleep;Sit;Transfers;Stand;Lift;Bed Mobility;Bend;Locomotion Level    Examination-Participation Restrictions  Cleaning;Community Activity;Driving;Interpersonal Relationship;Meal Prep    Stability/Clinical Decision Making  Evolving/Moderate complexity    Clinical Decision Making  Moderate    Rehab Potential  Good    PT Frequency  2x / week    PT Duration  6 weeks    PT Treatment/Interventions  ADLs/Self Care Home Management;Cryotherapy;Ultrasound;Traction;Moist Heat;Therapeutic activities;Patient/family education;Taping;Passive range of motion;Dry needling;Manual techniques;Balance training;Therapeutic exercise;Neuromuscular re-education;Gait training;Functional mobility training    PT Next Visit Plan  check HEP, knee ROM, check hip MMT, add in core , Nustep ?    PT Home Exercise Plan  seated hamstring , piriformis , lower trunk rotation, standing "L" stretch and knee to chest    Consulted and Agree with Plan of Care  Patient       Patient will benefit from skilled therapeutic intervention in order to improve the following deficits and impairments:  Abnormal gait, Postural dysfunction, Pain, Increased fascial restricitons, Decreased mobility, Decreased activity tolerance, Decreased strength, Obesity, Impaired flexibility, Increased edema, Decreased balance, Difficulty walking, Decreased range of motion  Visit Diagnosis: Pain in left leg  Muscle weakness (generalized)  Other abnormalities of gait and mobility  Radiculopathy, lumbosacral region     Problem List Patient Active Problem List   Diagnosis Date  Noted  . AB (asthmatic bronchitis), mild persistent, with acute exacerbation   . Unstable angina ([place]) 01/16/2018  . Primary osteoarthritis of both knees 07/29/2017  . Cellulitis of right arm 10/07/2016  . Sepsis ([place]) 10/07/2016  . Chest pain 10/07/2016  . Dyspnea on exertion 05/24/2015  . DVT, lower extremity ([place]) 05/09/2014  . Right calf pain 06/21/2013  . Pulmonary embolism ([place]) 06/21/2013  . Dysphagia 06/27/2011  . Cough 06/27/2011  . CHEST PAIN 01/18/2009  . RHINITIS 04/13/2007  . Obstructive chronic bronchitis with acute bronchitis ([place]) 04/13/2007  . GERD 04/13/2007  . Depression 01/21/2007  . OSTEOARTHRITIS 01/21/2007  . LOW BACK PAIN 01/21/2007  . BREAST CANCER, HX OF 01/21/2007    Diane Barker 06/07/2019, 4:53 PM  [facility] [address] [place], [place], [address] Phone: [phone]   Fax:  [phone]  Name: Diane Barker MRN: [id number] Date of Birth: 12/10/1950  Raeford Razor, PT 06/07/19 4:54 PM Phone: [phone] Fax: [phone]  Did not return per MD orders due to leg swelling.   Raeford Razor, PT 08/02/19 2:51 PM Phone: [phone] Fax: [phone]

## 2019-06-07 NOTE — Patient Instructions (Signed)
Access Code: [email or web address]: [email or web address]: 03/23/2021Prepared by: Anderson Malta PaaExercises  Seated Hamstring Stretch - 2 x daily - 7 x weekly - 1 sets - 5 reps - 30 hold  Seated Piriformis Stretch with Trunk Bend - 2 x daily - 7 x weekly - 1 sets - 5 reps - 30 hold  Hooklying Single Knee to Chest Stretch - 2 x daily - 7 x weekly - 1 sets - 3 reps - 30 hold  Supine Lower Trunk Rotation - 2 x daily - 7 x weekly - 2 sets - 10 reps - 10 hold  Standing 'L' Stretch at Counter - 2 x daily - 7 x weekly - 1 sets - 10 reps - 15 hold

## 2019-06-14 ENCOUNTER — Ambulatory Visit: Payer: 59

## 2019-06-19 ENCOUNTER — Other Ambulatory Visit: Payer: Self-pay | Admitting: Internal Medicine

## 2019-06-20 ENCOUNTER — Other Ambulatory Visit: Payer: Self-pay

## 2019-06-20 ENCOUNTER — Encounter: Payer: Self-pay | Admitting: Physician Assistant

## 2019-06-20 ENCOUNTER — Ambulatory Visit (HOSPITAL_COMMUNITY)
Admission: RE | Admit: 2019-06-20 | Discharge: 2019-06-20 | Disposition: A | Discharge status: Home / Self Care | Payer: 59 | Source: Ambulatory Visit | Attending: Physician Assistant | Admitting: Physician Assistant

## 2019-06-20 ENCOUNTER — Ambulatory Visit (INDEPENDENT_AMBULATORY_CARE_PROVIDER_SITE_OTHER): Payer: 59 | Admitting: Physician Assistant

## 2019-06-20 DIAGNOSIS — M7989 Other specified soft tissue disorders: Secondary | ICD-10-CM

## 2019-06-20 DIAGNOSIS — M79662 Pain in left lower leg: Secondary | ICD-10-CM

## 2019-06-20 DIAGNOSIS — M79605 Pain in left leg: Secondary | ICD-10-CM | POA: Diagnosis not present

## 2019-06-20 NOTE — Progress Notes (Signed)
Office Visit Note   Patient: Diane Barker           Date of Birth: Jul 23, 1950           MRN: [id number] Visit Date: 06/20/2019              Requested by: Shon Baton, [place],  [place] [address] PCP: Shon Baton, MD   Assessment & Plan: Visit Diagnoses:  1. Pain in left leg   2. Pain and swelling of left lower leg     Plan: We will send her for an ultrasound of the left lower leg to rule out reflux.  Versus DVT is unlikely given the fact she is on Xarelto as the source of her left leg swelling.  Discussed with her that I felt it was more important that we work-up her left leg swelling at this time.  She will follow-up with [place] as scheduled on 06/28/2019 at that time we will discuss her back in detail.  Follow-Up Instructions: Return in about 8 days (around 06/28/2019).   Orders:  Orders Placed This Encounter  Procedures  . VAS [place] LOWER EXTREMITY VENOUS (DVT)   No orders of the defined types were placed in this encounter.     Procedures: No procedures performed   Clinical Data: No additional findings.   Subjective: Chief Complaint  Patient presents with  . Left Leg - Follow-up    HPI Diane Barker comes in today due to left knee pain.  States the injection on 05/31/2019 was helpful.  However since last Thursday she has developed swelling states her legs been shiny.  She does have a history of DVT she is on chronic Xarelto.  DVT was in the right leg.  She also notes that she is had some chest pain last we discussed this with her primary care doctor who she states felt it was reflux.  Since that time she has developed swelling in the left leg only.  No injury to the knee.  Has known tricompartmental arthritis left knee.  Questions if this is a Baker's cyst. She did have an MRI of her lumbar spine since we have last seen her.  States that she cannot do the exercises were back due to the fact that her leg is so uncomfortable on the left.  Also she had some spasm  after doing home exercise program. Review of Systems  Constitutional: Negative for diaphoresis.  Respiratory: Negative for shortness of breath.   Cardiovascular: Positive for chest pain.     Objective: Vital Signs: There were no vitals taken for this visit.  Physical Exam Constitutional:      Appearance: She is not ill-appearing or diaphoretic.  Cardiovascular:     Pulses: Normal pulses.  Pulmonary:     Effort: Pulmonary effort is normal.  Neurological:     Mental Status: She is alert and oriented to person, place, and time.  Psychiatric:        Mood and Affect: Mood normal.     Ortho Exam Left lower leg global swelling.  No ecchymosis erythema.  Tenderness left calf.  Pitting edema over the left tibial spine. Specialty Comments:  No specialty comments available.  Imaging: No results found.   PMFS History: Patient Active Problem List   Diagnosis Date Noted  . AB (asthmatic bronchitis), mild persistent, with acute exacerbation   . Unstable angina ([place]) 01/16/2018  . Primary osteoarthritis of both knees 07/29/2017  . Cellulitis of right arm 10/07/2016  .  Sepsis ([place]) 10/07/2016  . Chest pain 10/07/2016  . Dyspnea on exertion 05/24/2015  . DVT, lower extremity ([place]) 05/09/2014  . Right calf pain 06/21/2013  . Pulmonary embolism ([place]) 06/21/2013  . Dysphagia 06/27/2011  . Cough 06/27/2011  . CHEST PAIN 01/18/2009  . RHINITIS 04/13/2007  . Obstructive chronic bronchitis with acute bronchitis ([place]) 04/13/2007  . GERD 04/13/2007  . Depression 01/21/2007  . OSTEOARTHRITIS 01/21/2007  . LOW BACK PAIN 01/21/2007  . BREAST CANCER, HX OF 01/21/2007   Past Medical History:  Diagnosis Date  . Anxiety   . Arthritis    "all over my body" (01/15/2018)  . Breast cancer, right breast ([place]) 2000   Rt lumpectomy, Chemo/XRT/rt axillary node   . Bronchial asthma   . Chronic lower back pain   . Depression   . Diverticulosis   . DVT (deep venous thrombosis) ([place]) 06/2013    RLE  . GERD (gastroesophageal reflux disease)   . High cholesterol   . Hx of adenomatous colonic polyps   . Hypertension   . Obesity   . Osteoarthritis   . Personal history of chemotherapy   . Personal history of radiation therapy   . Pulmonary embolism ([place]) 06/2013   "both lungs"  . Spondylolisthesis     Family History  Problem Relation Age of Onset  . Asthma Mother   . COPD Mother   . Heart attack Mother   . Diabetes Mother   . Lung cancer Father   . Asthma Daughter   . Kidney failure Brother   . Diabetes Brother        x 2  . Heart attack Brother   . Breast cancer Maternal Aunt   . Colon cancer Paternal Aunt   . Diabetes Daughter        x 2  . Hypertension Brother     Past Surgical History:  Procedure Laterality Date  . BREAST BIOPSY Right 2000  . BREAST LUMPECTOMY Right 2000   for CA txed w/ chemo and radiation, right  . CARDIAC CATHETERIZATION    . HERNIA REPAIR    . INTRAVASCULAR PRESSURE WIRE/FFR STUDY N/A 01/18/2018   Procedure: INTRAVASCULAR PRESSURE WIRE/FFR STUDY;  Surgeon: Burnell Blanks, MD;  Location: [facility];  Service: Cardiovascular;  Laterality: N/A;  . LEFT HEART CATH AND CORONARY ANGIOGRAPHY N/A 01/18/2018   Procedure: LEFT HEART CATH AND CORONARY ANGIOGRAPHY;  Surgeon: Burnell Blanks, MD;  Location: [facility];  Service: Cardiovascular;  Laterality: N/A;  . TUBAL LIGATION    . [place]   Social History   Occupational History  . 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 Smoking status: Never Smoker  . Smokeless tobacco: Never Used  Substance and Sexual Activity  . Alcohol use: Never    Alcohol/week: 0.0 standard drinks  . Drug use: Never  . Sexual activity: Not Currently

## 2019-06-20 NOTE — Progress Notes (Signed)
Left lower extremity venous duplex completed. Refer to "CV Proc" under chart review to view preliminary results.  06/20/2019 2:03 PM Kelby Aline., MHA, RVT, RDCS, RDMS

## 2019-06-28 ENCOUNTER — Encounter: Payer: 59 | Admitting: Physical Therapy

## 2019-06-28 ENCOUNTER — Ambulatory Visit: Payer: 59 | Admitting: Physical Therapy

## 2019-06-29 ENCOUNTER — Other Ambulatory Visit: Payer: Self-pay | Admitting: Internal Medicine

## 2019-06-29 ENCOUNTER — Telehealth: Payer: Self-pay | Admitting: Orthopaedic Surgery

## 2019-06-29 NOTE — Telephone Encounter (Signed)
Supplemental injection

## 2019-06-29 NOTE — Telephone Encounter (Signed)
Please advise 

## 2019-06-29 NOTE — Telephone Encounter (Signed)
Patient called.   She thinks her cyst has come back and would like a call back to discuss her next step.   Call back: [phone]

## 2019-06-30 ENCOUNTER — Encounter: Payer: 59 | Admitting: Physical Therapy

## 2019-06-30 ENCOUNTER — Telehealth: Payer: Self-pay

## 2019-06-30 NOTE — Telephone Encounter (Signed)
Approved for Synvisc One-Left knee Dr. Margarito Liner and [facility] No copay 20% OOP No prior auth required '   New start

## 2019-06-30 NOTE — Telephone Encounter (Signed)
Submitted for VOB for Synvisc One-Left knee °

## 2019-06-30 NOTE — Telephone Encounter (Signed)
Can we order a gel injection for her left knee please

## 2019-07-01 NOTE — Telephone Encounter (Signed)
Pt stated she doesn't know her schedule. She stated she will call back to schedule appointment   Ok to schedule @ next available

## 2019-07-05 ENCOUNTER — Encounter: Payer: 59 | Admitting: Physical Therapy

## 2019-07-05 ENCOUNTER — Ambulatory Visit: Payer: 59 | Admitting: Physical Therapy

## 2019-07-06 ENCOUNTER — Other Ambulatory Visit: Payer: Self-pay

## 2019-07-06 ENCOUNTER — Ambulatory Visit (INDEPENDENT_AMBULATORY_CARE_PROVIDER_SITE_OTHER): Payer: 59 | Admitting: Internal Medicine

## 2019-07-06 VITALS — BP 126/70 | HR 77 | Temp 97.5°F | Ht 65.0 in | Wt 253.0 lb

## 2019-07-06 DIAGNOSIS — I2510 Atherosclerotic heart disease of native coronary artery without angina pectoris: Secondary | ICD-10-CM

## 2019-07-06 DIAGNOSIS — Z86711 Personal history of pulmonary embolism: Secondary | ICD-10-CM | POA: Diagnosis not present

## 2019-07-06 DIAGNOSIS — Z79899 Other long term (current) drug therapy: Secondary | ICD-10-CM

## 2019-07-06 DIAGNOSIS — I208 Other forms of angina pectoris: Secondary | ICD-10-CM

## 2019-07-06 DIAGNOSIS — Z86718 Personal history of other venous thrombosis and embolism: Secondary | ICD-10-CM

## 2019-07-06 MED ORDER — ATORVASTATIN CALCIUM 40 MG PO TABS: 40.0000 mg | ORAL_TABLET | Freq: Every day | ORAL | 0 refills | Status: DC

## 2019-07-06 MED ORDER — ISOSORBIDE MONONITRATE ER 60 MG PO TB24: 60.0000 mg | ORAL_TABLET | Freq: Every day | ORAL | 3 refills | Status: DC

## 2019-07-06 MED ORDER — NITROGLYCERIN 0.4 MG SL SUBL: 0.4000 mg | SUBLINGUAL_TABLET | SUBLINGUAL | 3 refills | Status: DC | PRN

## 2019-07-06 NOTE — Progress Notes (Signed)
Cardiology Office Note:    Date:  07/06/2019   ID:  Diane Barker, DOB 17-Mar-1951, MRN [id number]  PCP:  Shon Baton, MD  Cardiologist:  No primary care provider on file.  Electrophysiologist:  None   Referring MD: Shon Baton, MD   Chief Complaint: Follow-up chest pain, dyspnea on exertion, LAD disease  History of Present Illness:    Diane Barker is a 69 y.o. female with a history of coronary artery disease who presents for follow-up.  She has been having some episodes of chest pain, most recently less than a month ago.  This is occurred twice since her last visit.  She has had to take nitro twice and it helps her.  Of note she is on long-acting nitrate.  She also has continued mild shortness of breath but this improved on Lasix therapy.  She denies significant palpitations, PND, orthopnea, leg swelling.  Denies syncope.  She is continuing to deal with situational stressors regarding family.  Past Medical History:  Diagnosis Date  . Anxiety   . Arthritis    "all over my body" (01/15/2018)  . Breast cancer, right breast ([place]) 2000   Rt lumpectomy, Chemo/XRT/rt axillary node   . Bronchial asthma   . Chronic lower back pain   . Depression   . Diverticulosis   . DVT (deep venous thrombosis) ([place]) 06/2013   RLE  . GERD (gastroesophageal reflux disease)   . High cholesterol   . Hx of adenomatous colonic polyps   . Hypertension   . Obesity   . Osteoarthritis   . Personal history of chemotherapy   . Personal history of radiation therapy   . Pulmonary embolism ([place]) 06/2013   "both lungs"  . Spondylolisthesis     Past Surgical History:  Procedure Laterality Date  . BREAST BIOPSY Right 2000  . BREAST LUMPECTOMY Right 2000   for CA txed w/ chemo and radiation, right  . CARDIAC CATHETERIZATION    . HERNIA REPAIR    . INTRAVASCULAR PRESSURE WIRE/FFR STUDY N/A 01/18/2018   Procedure: INTRAVASCULAR PRESSURE WIRE/FFR STUDY;  Surgeon: Burnell Blanks, MD;  Location: [facility];  Service: Cardiovascular;  Laterality: N/A;  . LEFT HEART CATH AND CORONARY ANGIOGRAPHY N/A 01/18/2018   Procedure: LEFT HEART CATH AND CORONARY ANGIOGRAPHY;  Surgeon: Burnell Blanks, MD;  Location: [facility];  Service: Cardiovascular;  Laterality: N/A;  . TUBAL LIGATION    . UMBILICAL HERNIA REPAIR  1983    Current Medications: Current Meds  Medication Sig  . acetaminophen (TYLENOL) 500 MG tablet Take 500 mg by mouth every 6 (six) hours as needed for mild pain.  Marland Kitchen aspirin 81 MG EC tablet Take 1 tablet (81 mg total) by mouth daily.  Marland Kitchen atorvastatin (LIPITOR) 40 MG tablet Take 1 tablet (40 mg total) by mouth daily at 6 PM.  . budesonide-formoterol (SYMBICORT) 80-4.5 MCG/ACT inhaler Inhale 2 puffs, then rinse mouth, twice daily maintenance  . Cholecalciferol (VITAMIN D) 2000 UNITS CAPS Take 4,000 Units by mouth daily.   . clotrimazole (CLOTRIMAZOLE ANTI-FUNGAL) 1 % cream Apply 1 application topically 2 (two) times daily.  Marland Kitchen dexlansoprazole (DEXILANT) 60 MG capsule Take 60 mg by mouth daily.  Marland Kitchen escitalopram (LEXAPRO) 10 MG tablet Take 20 mg by mouth.   . furosemide (LASIX) 20 MG tablet Take 1 tablet (20 mg total) by mouth daily.  Marland Kitchen ipratropium-albuterol (DUONEB) 0.5-2.5 (3) MG/3ML SOLN Take 3 mLs by nebulization every 6 (six) hours as  needed.  . isosorbide mononitrate (IMDUR) 60 MG 24 hr tablet Take 1 tablet (60 mg total) by mouth daily.  . methylPREDNISolone (MEDROL) 4 MG tablet Take as directed  . nitroGLYCERIN (NITROSTAT) 0.4 MG SL tablet Place 1 tablet (0.4 mg total) under the tongue every 5 (five) minutes as needed.  Marland Kitchen PROAIR HFA 108 (90 Base) MCG/ACT inhaler Inhale 2 puffs into the lungs every 4 (four) hours as needed for wheezing or shortness of breath.   . Spacer/Aero-Holding Chambers (AEROCHAMBER MV) inhaler Use as instructed  . vitamin B-12 (CYANOCOBALAMIN) 500 MCG tablet Take 500 mcg by mouth daily.  Alveda Reasons 20 MG TABS tablet Take 1 tablet by mouth  daily.  . [DISCONTINUED] atorvastatin (LIPITOR) 40 MG tablet Take 1 tablet (40 mg total) by mouth daily at 6 PM.  . [DISCONTINUED] isosorbide mononitrate (IMDUR) 30 MG 24 hr tablet Take 1 tablet (30 mg total) by mouth daily.  . [DISCONTINUED] nitroGLYCERIN (NITROSTAT) 0.4 MG SL tablet Place 1 tablet (0.4 mg total) under the tongue every 5 (five) minutes as needed.     Allergies:   Erythromycin, Gabapentin, Morphine, Penicillins, and Relafen [nabumetone]   Social History   Socioeconomic History  . Marital status: Single    Spouse name: Not on file  . Number of children: 2  . Years of education: 10  . Highest education level: Not on file  Occupational History  . 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 Smoking status: Never Smoker  . Smokeless tobacco: Never Used  Substance and Sexual Activity  . Alcohol use: Never    Alcohol/week: 0.0 standard drinks  . Drug use: Never  . Sexual activity: Not Currently  Other Topics Concern  . Not on file  Social History Narrative   Lives at home alone   Caffeine use- soda 16-24 oz weekly   Social Determinants of Health   Financial Resource Strain:   . Difficulty of Paying Living Expenses:   Food Insecurity:   . Worried About Charity fundraiser in the Last Year:   . Arboriculturist in the Last Year:   Transportation Needs:   . Film/video editor (Medical):   Marland Kitchen Lack of Transportation (Non-Medical):   Physical Activity:   . Days of Exercise per Week:   . Minutes of Exercise per Session:   Stress:   . Feeling of Stress :   Social Connections:   . Frequency of Communication with Friends and Family:   . Frequency of Social Gatherings with Friends and Family:   . Attends Religious Services:   . Active Member of Clubs or Organizations:   . Attends Archivist Meetings:   Marland Kitchen Marital Status:      Family History: The patient's family history includes Asthma in her  daughter and mother; Breast cancer in her maternal aunt; COPD in her mother; Colon cancer in her paternal aunt; Diabetes in her brother, daughter, and mother; Heart attack in her brother and mother; Hypertension in her brother; Kidney failure in her brother; Lung cancer in her father.  ROS:   Please see the history of present illness.    All other systems reviewed and are negative.  EKGs/Labs/Other Studies Reviewed:    The following studies were reviewed today:  EKG: Normal sinus rhythm, LVH   Recent Labs: 10/14/2018: BUN 12; Creatinine, Ser 1.00; Potassium 4.2; Sodium 141  Recent Lipid Panel  Component Value Date/Time   CHOL 182 01/16/2018 0238   TRIG 60 01/16/2018 0238   HDL 46 01/16/2018 0238   CHOLHDL 4.0 01/16/2018 0238   VLDL 12 01/16/2018 0238   LDLCALC 124 (H) 01/16/2018 0238    Physical Exam:    VS:  BP 126/70 (BP Location: Left Arm, Patient Position: Sitting, Cuff Size: Large)   Pulse 77   Temp (!) 97.5 F (36.4 C)   Ht 5\' 5"  (1.651 m)   Wt 253 lb (114.8 kg)   BMI 42.10 kg/m     Wt Readings from Last 5 Encounters:  07/06/19 253 lb (114.8 kg)  01/17/19 259 lb 1.6 oz (117.5 kg)  09/24/18 262 lb 9.6 oz (119.1 kg)  07/30/18 268 lb (121.6 kg)  04/27/18 262 lb 2 oz (118.9 kg)     Constitutional: No acute distress Eyes: sclera non-icteric, normal conjunctiva and lids ENMT: Mask in place Cardiovascular: regular rhythm, normal rate, no murmurs. S1 and S2 normal. Radial pulses normal bilaterally. No jugular venous distention.  Respiratory: clear to auscultation bilaterally GI : normal bowel sounds, soft and nontender. No distention.   MSK: extremities warm, well perfused. No edema.  NEURO: grossly nonfocal exam, moves all extremities. PSYCH: alert and oriented x 3, normal mood and affect.   ASSESSMENT:    1. Medication management   2. Stable angina ([place])   3. Coronary artery disease involving native coronary artery of native heart without angina pectoris     PLAN:    Stable angina ([place]) with known CAD - Plan: EKG 12-Lead, atorvastatin (LIPITOR) 40 MG tablet, nitroGLYCERIN (NITROSTAT) 0.4 MG SL tablet, isosorbide mononitrate (IMDUR) 60 MG 24 hr tablet  She has infrequent but nitro responsive episodes of chest pain with known LAD disease.  We have discussed coronary angiography with potential PCI for continued angina despite medical therapy.  We have participated in shared decision making.  She feels that the episodes are not frequent enough to warrant a stent just quite yet, and there is room to titrate her medical therapy.  We discussed maximizing medical therapy for angina prior to PCI for chest pain.  With this in mind we will increase her Imdur to 60 mg daily given that her chest pain is nitro responsive. -Continue aspirin and statin  Medication management - Plan: EKG 12-Lead, atorvastatin (LIPITOR) 40 MG tablet, nitroGLYCERIN (NITROSTAT) 0.4 MG SL tablet, isosorbide mononitrate (IMDUR) 60 MG 24 hr tablet  History of DVT PE-continue Xarelto  Hyperlipidemia-continue statin therapy.  Total time of encounter: 30 minutes total time of encounter, including 25 minutes spent in face-to-face patient care on the date of this encounter. This time includes coordination of care and counseling regarding above mentioned problem list. Remainder of non-face-to-face time involved reviewing chart documents/testing relevant to the patient encounter and documentation in the medical record. I have independently reviewed documentation from referring provider.   Cherlynn Kaiser, MD [place]  [facility]    Medication Adjustments/Labs and Tests Ordered: Current medicines are reviewed at length with the patient today.  Concerns regarding medicines are outlined above.  Orders Placed This Encounter  Procedures  . EKG 12-Lead   Meds ordered this encounter  Medications  . atorvastatin (LIPITOR) 40 MG tablet    Sig: Take 1 tablet (40 mg total) by mouth daily  at 6 PM.    Dispense:  30 tablet    Refill:  0    Patient needs an appointment for more refills.  . nitroGLYCERIN (NITROSTAT) 0.4 MG  SL tablet    Sig: Place 1 tablet (0.4 mg total) under the tongue every 5 (five) minutes as needed.    Dispense:  25 tablet    Refill:  3  . isosorbide mononitrate (IMDUR) 60 MG 24 hr tablet    Sig: Take 1 tablet (60 mg total) by mouth daily.    Dispense:  90 tablet    Refill:  3    Patient Instructions  Medication Instructions:  INCREASE YOUR IMDUR TO 60MG  DAILY *If you need a refill on your cardiac medications before your next appointment, please call your pharmacy*  Follow-Up: At [facility], you and your health needs are our priority.  As part of our continuing mission to provide you with exceptional heart care, we have created designated Provider Care Teams.  These Care Teams include your primary Cardiologist (physician) and Advanced Practice Providers (APPs -  Physician Assistants and Nurse Practitioners) who all work together to provide you with the care you need, when you need it.  We recommend signing up for the patient portal called "MyChart".  Sign up information is provided on this After Visit Summary.  MyChart is used to connect with patients for Virtual Visits (Telemedicine).  Patients are able to view lab/test results, encounter notes, upcoming appointments, etc.  Non-urgent messages can be sent to your provider as well.   To learn more about what you can do with MyChart, go to [email or web address].    Your next appointment:   3 month(s)  The format for your next appointment:   In Person  Provider:   Cherlynn Kaiser, MD

## 2019-07-06 NOTE — Patient Instructions (Signed)
Medication Instructions:  INCREASE YOUR IMDUR TO 60MG  DAILY *If you need a refill on your cardiac medications before your next appointment, please call your pharmacy*  Follow-Up: At [facility], you and your health needs are our priority.  As part of our continuing mission to provide you with exceptional heart care, we have created designated Provider Care Teams.  These Care Teams include your primary Cardiologist (physician) and Advanced Practice Providers (APPs -  Physician Assistants and Nurse Practitioners) who all work together to provide you with the care you need, when you need it.  We recommend signing up for the patient portal called "MyChart".  Sign up information is provided on this After Visit Summary.  MyChart is used to connect with patients for Virtual Visits (Telemedicine).  Patients are able to view lab/test results, encounter notes, upcoming appointments, etc.  Non-urgent messages can be sent to your provider as well.   To learn more about what you can do with MyChart, go to [email or web address].    Your next appointment:   3 month(s)  The format for your next appointment:   In Person  Provider:   Cherlynn Kaiser, MD

## 2019-07-07 ENCOUNTER — Encounter: Payer: 59 | Admitting: Physical Therapy

## 2019-07-12 ENCOUNTER — Encounter: Payer: 59 | Admitting: Physical Therapy

## 2019-07-12 ENCOUNTER — Ambulatory Visit: Payer: 59 | Admitting: Physical Therapy

## 2019-07-14 ENCOUNTER — Encounter: Payer: 59 | Admitting: Physical Therapy

## 2019-07-19 ENCOUNTER — Ambulatory Visit: Payer: 59 | Admitting: Physical Therapy

## 2019-07-19 ENCOUNTER — Encounter: Payer: 59 | Admitting: Physical Therapy

## 2019-07-21 ENCOUNTER — Encounter: Payer: 59 | Admitting: Physical Therapy

## 2019-07-22 ENCOUNTER — Other Ambulatory Visit: Payer: Self-pay | Admitting: Internal Medicine

## 2019-08-01 ENCOUNTER — Ambulatory Visit: Payer: 59 | Admitting: Internal Medicine

## 2019-08-03 ENCOUNTER — Other Ambulatory Visit: Payer: Self-pay | Admitting: Internal Medicine

## 2019-08-03 DIAGNOSIS — I2510 Atherosclerotic heart disease of native coronary artery without angina pectoris: Secondary | ICD-10-CM

## 2019-08-03 DIAGNOSIS — I208 Other forms of angina pectoris: Secondary | ICD-10-CM

## 2019-08-03 DIAGNOSIS — Z79899 Other long term (current) drug therapy: Secondary | ICD-10-CM

## 2019-08-03 MED ORDER — ATORVASTATIN CALCIUM 40 MG PO TABS: 40.0000 mg | ORAL_TABLET | Freq: Every day | ORAL | 1 refills | Status: DC

## 2019-08-03 NOTE — Telephone Encounter (Signed)
*  STAT* If patient is at the pharmacy, call can be transferred to refill team.   1. Which medications need to be refilled? (please list name of each medication and dose if known) atorvastatin (LIPITOR) 40 MG tablet  2. Which pharmacy/location (including street and city if local pharmacy) is medication to be sent to? [id number] - [place], [place] - [address] AT [place]  3. Do they need a 30 day or 90 day supply? 90   Patient states [facility] several attempts to get this medication refilled.

## 2019-08-03 NOTE — Addendum Note (Signed)
Addended by: Marlane Hatcher on: 08/03/2019 02:29 PM   Modules accepted: Orders

## 2019-08-31 ENCOUNTER — Other Ambulatory Visit: Payer: Self-pay

## 2019-08-31 ENCOUNTER — Emergency Department (HOSPITAL_COMMUNITY)
Admission: EM | Admit: 2019-08-31 | Discharge: 2019-09-01 | Disposition: A | Discharge status: Home / Self Care | Payer: 59 | Attending: Emergency Medicine | Admitting: Emergency Medicine

## 2019-08-31 ENCOUNTER — Encounter (HOSPITAL_COMMUNITY): Payer: Self-pay | Admitting: *Deleted

## 2019-08-31 DIAGNOSIS — R350 Frequency of micturition: Secondary | ICD-10-CM | POA: Diagnosis not present

## 2019-08-31 DIAGNOSIS — Z5321 Procedure and treatment not carried out due to patient leaving prior to being seen by health care provider: Secondary | ICD-10-CM | POA: Diagnosis not present

## 2019-08-31 DIAGNOSIS — R1031 Right lower quadrant pain: Secondary | ICD-10-CM | POA: Insufficient documentation

## 2019-08-31 LAB — URINALYSIS, ROUTINE W REFLEX MICROSCOPIC
Bilirubin Urine: NEGATIVE
Glucose, UA: NEGATIVE mg/dL
Hgb urine dipstick: NEGATIVE
Ketones, ur: NEGATIVE mg/dL
Nitrite: NEGATIVE
Protein, ur: NEGATIVE mg/dL
Specific Gravity, Urine: 1.018 (ref 1.005–1.030)
pH: 5 (ref 5.0–8.0)

## 2019-08-31 LAB — CBC
HCT: 41.6 % (ref 36.0–46.0)
Hemoglobin: 13.4 g/dL (ref 12.0–15.0)
MCH: 32.5 pg (ref 26.0–34.0)
MCHC: 32.2 g/dL (ref 30.0–36.0)
MCV: 101 fL — ABNORMAL HIGH (ref 80.0–100.0)
Platelets: 247 10*3/uL (ref 150–400)
RBC: 4.12 MIL/uL (ref 3.87–5.11)
RDW: 12.7 % (ref 11.5–15.5)
WBC: 7.4 10*3/uL (ref 4.0–10.5)
nRBC: 0 % (ref 0.0–0.2)

## 2019-08-31 LAB — COMPREHENSIVE METABOLIC PANEL
ALT: 18 U/L (ref 0–44)
AST: 19 U/L (ref 15–41)
Albumin: 3.3 g/dL — ABNORMAL LOW (ref 3.5–5.0)
Alkaline Phosphatase: 81 U/L (ref 38–126)
Anion gap: 8 (ref 5–15)
BUN: 15 mg/dL (ref 8–23)
CO2: 26 mmol/L (ref 22–32)
Calcium: 9 mg/dL (ref 8.9–10.3)
Chloride: 107 mmol/L (ref 98–111)
Creatinine, Ser: 1.01 mg/dL — ABNORMAL HIGH (ref 0.44–1.00)
GFR calc Af Amer: >60 mL/min (ref >60)
GFR calc non Af Amer: 56 mL/min — ABNORMAL LOW (ref >60)
Glucose, Bld: 96 mg/dL (ref 70–99)
Potassium: 4.1 mmol/L (ref 3.5–5.1)
Sodium: 141 mmol/L (ref 135–145)
Total Bilirubin: 0.9 mg/dL (ref 0.3–1.2)
Total Protein: 6.8 g/dL (ref 6.5–8.1)

## 2019-08-31 LAB — LIPASE, BLOOD: Lipase: 23 U/L (ref 11–51)

## 2019-08-31 MED ORDER — SODIUM CHLORIDE 0.9% FLUSH: 3.0000 mL | Freq: Once | INTRAVENOUS | Status: DC

## 2019-08-31 NOTE — ED Triage Notes (Signed)
The pt is c/o rt flank pain since last night   No nausea or vomiting  Urinary frequency but no bloody urin

## 2019-09-01 ENCOUNTER — Encounter: Payer: Self-pay | Admitting: Internal Medicine

## 2019-09-01 ENCOUNTER — Ambulatory Visit (INDEPENDENT_AMBULATORY_CARE_PROVIDER_SITE_OTHER): Payer: 59 | Admitting: Internal Medicine

## 2019-09-01 DIAGNOSIS — J440 Chronic obstructive pulmonary disease with acute lower respiratory infection: Secondary | ICD-10-CM | POA: Diagnosis not present

## 2019-09-01 DIAGNOSIS — M545 Low back pain, unspecified: Secondary | ICD-10-CM

## 2019-09-01 MED ORDER — BUDESONIDE-FORMOTEROL FUMARATE 80-4.5 MCG/ACT IN AERO: INHALATION_SPRAY | RESPIRATORY_TRACT | 12 refills | Status: DC

## 2019-09-01 MED ORDER — PROAIR HFA 108 (90 BASE) MCG/ACT IN AERS: 2.0000 | INHALATION_SPRAY | RESPIRATORY_TRACT | 12 refills | Status: DC | PRN

## 2019-09-01 NOTE — Patient Instructions (Signed)
Refill scripts for albuterol rescue inhaler and Symbicort  Please call if we can help  I really do recommend you go ahead and get the Covid vaccine.

## 2019-09-01 NOTE — Progress Notes (Signed)
Patient ID: Diane Barker, female    DOB: Apr 20, 1950, 69 y.o.   MRN: [id number]  HPI F never smoker followed for bronchitis with hx rhinitis, GERD, hx breast cancer, Hx DVT/ PE/ Xarelto. Office Spirometry 04/30/2015- WNL- FVC 2.75/87%, FEV1 2.29/93%, FEV1/FVC 0.83, FEF 25-75 percent 2.83/126%. PFT 01/08/2018- insignificant response to dilator, mild restriction, diffusion mildly reduced.  FVC 2.52/78%, FEV1 2.21/90%, ratio 1.88, FEF 25-75% 3.30/259%, TLC 75%, DLCO 63% ------------------------------------------------------------------------------  07/30/2018- 69 year old female never smoker followed for bronchitis, rhinitis, complicated by GERD, history breast cancer/XRT, history DVT/PE/Xarelto PFT today -----usual DOE, cough, & wheeze; here because DME: [employer] requires OV for nebulizer compressor ProAir hfa, Neb Duoneb,  Notes increased cough, usually dry, more frequent usee of rescue inhaler, reflux with occ food hang-up. She declined EGD- didn't want to be sedated and have tube down throat.   09/01/19- 69 year old female never smoker followed for bronchitis, rhinitis, complicated by GERD, history breast cancer/XRT, history DVT/PE/Xarelto, Covid infection November 2020,  PFT today Proair, [employer], Symbicort 160- not using ED last night for flank pain- suspected kidney stone. Wait was too long and she left. Much better now, and today she thinks it was muscle spasm from sleeping on unfamiliar hard mattress. Had Covid infection in November, not needing hosp. Has put off getting Covax and was strongly encouraged to go ahead. No bleeding on Xarelto  Review of Systems-See HPI   + = positive Constitutional:   No-   weight loss, night sweats, fevers, chills, fatigue, lassitude. HEENT:   +  headaches, No-difficulty swallowing, tooth/dental problems, +sore throat,        sneezing, itching, ear ache, nasal congestion, +post nasal drip,  CV:  +atypical chest pain, orthopnea, PND, swelling in lower  extremities, anasarca,  dizziness, palpitations Resp: +shortness of breath with exertion or at rest.              + productive cough,  + non-productive cough,  No- coughing up of blood.           change in color of mucus.  No- wheezing.   Skin: No-   rash or lesions. GI:  +  heartburn, indigestion, abdominal pain, +nausea, vomiting,  GU: MS:  No-   joint pain or swelling, + R flank pain Neuro-     nothing unusual Psych:  No- change in mood or affect. No depression or anxiety.  No memory loss.  Objective:  OBJ- Physical Exam General- Alert, Oriented, Affect-appropriate/ calm, Distress- none acute,  + obese Skin- rash-none, lesions- none, excoriation+  Lymphadenopathy- none Head- atraumatic            Eyes- Gross vision intact, PERRLA, conjunctivae and secretions clear            Ears- Hearing, canals-normal            Nose- clear, no-Septal dev, mucus, polyps, erosion, perforation             Throat- Mallampati IV , mucosa clear , drainage- none, tonsils- atrophic,  Neck- flexible , trachea midline, no stridor , thyroid nl, carotid no bruit Chest - symmetrical excursion , unlabored           Heart/CV- RRR , no murmur , no gallop  , no rub, nl s1 s2                           - JVD- none , edema +1, stasis changes- none, varices- none  Lung- + clear, unlabored, wheeze- none, cough -none, dullness-none, rub- none           Chest wall-  Abd-  Br/ Gen/ Rectal- Not done, not indicated Extrem- cyanosis- none, clubbing, none, atrophy- none, strength- nl, + excoriated stasis dermatitis Neuro- grossly intact to observation

## 2019-09-03 NOTE — Assessment & Plan Note (Signed)
Moderate persistent uncomplicated. Using rescue inhaler only occasionally. Plan- meds discussed and refilled

## 2019-09-03 NOTE — Assessment & Plan Note (Signed)
She thinks now this may have been muscle spasm from hard mattress.  Plan- heating pad, analgesics, stretching.

## 2019-10-10 ENCOUNTER — Ambulatory Visit: Payer: 59 | Admitting: Internal Medicine

## 2019-10-10 ENCOUNTER — Emergency Department (HOSPITAL_COMMUNITY): Payer: 59

## 2019-10-10 ENCOUNTER — Emergency Department (HOSPITAL_COMMUNITY)
Admission: EM | Admit: 2019-10-10 | Discharge: 2019-10-10 | Disposition: A | Discharge status: Home / Self Care | Payer: 59 | Attending: Emergency Medicine | Admitting: Emergency Medicine

## 2019-10-10 ENCOUNTER — Other Ambulatory Visit: Payer: Self-pay

## 2019-10-10 ENCOUNTER — Encounter (HOSPITAL_COMMUNITY): Payer: Self-pay

## 2019-10-10 DIAGNOSIS — S0181XA Laceration without foreign body of other part of head, initial encounter: Secondary | ICD-10-CM | POA: Diagnosis not present

## 2019-10-10 DIAGNOSIS — Y92531 Health care provider office as the place of occurrence of the external cause: Secondary | ICD-10-CM | POA: Insufficient documentation

## 2019-10-10 DIAGNOSIS — W19XXXA Unspecified fall, initial encounter: Secondary | ICD-10-CM | POA: Insufficient documentation

## 2019-10-10 DIAGNOSIS — Z79899 Other long term (current) drug therapy: Secondary | ICD-10-CM | POA: Diagnosis not present

## 2019-10-10 DIAGNOSIS — Y999 Unspecified external cause status: Secondary | ICD-10-CM | POA: Diagnosis not present

## 2019-10-10 DIAGNOSIS — S0993XA Unspecified injury of face, initial encounter: Secondary | ICD-10-CM | POA: Diagnosis present

## 2019-10-10 DIAGNOSIS — Y9301 Activity, walking, marching and hiking: Secondary | ICD-10-CM | POA: Diagnosis not present

## 2019-10-10 LAB — CBC
HCT: 39.3 % (ref 36.0–46.0)
Hemoglobin: 12.8 g/dL (ref 12.0–15.0)
MCH: 32.4 pg (ref 26.0–34.0)
MCHC: 32.6 g/dL (ref 30.0–36.0)
MCV: 99.5 fL (ref 80.0–100.0)
Platelets: 222 10*3/uL (ref 150–400)
RBC: 3.95 MIL/uL (ref 3.87–5.11)
RDW: 12.8 % (ref 11.5–15.5)
WBC: 4.6 10*3/uL (ref 4.0–10.5)
nRBC: 0 % (ref 0.0–0.2)

## 2019-10-10 LAB — BASIC METABOLIC PANEL
Anion gap: 7 (ref 5–15)
BUN: 11 mg/dL (ref 8–23)
CO2: 24 mmol/L (ref 22–32)
Calcium: 8.9 mg/dL (ref 8.9–10.3)
Chloride: 108 mmol/L (ref 98–111)
Creatinine, Ser: 1.14 mg/dL — ABNORMAL HIGH (ref 0.44–1.00)
GFR calc Af Amer: 56 mL/min — ABNORMAL LOW (ref >60)
GFR calc non Af Amer: 49 mL/min — ABNORMAL LOW (ref >60)
Glucose, Bld: 105 mg/dL — ABNORMAL HIGH (ref 70–99)
Potassium: 4 mmol/L (ref 3.5–5.1)
Sodium: 139 mmol/L (ref 135–145)

## 2019-10-10 MED ORDER — LIDOCAINE-EPINEPHRINE-TETRACAINE (LET) TOPICAL GEL
3.0000 mL | Freq: Once | TOPICAL | Status: AC
  Administered 2019-10-10: 3 mL via TOPICAL
  Filled 2019-10-10: qty 3

## 2019-10-10 MED ORDER — LIDOCAINE-EPINEPHRINE 1 %-1:100000 IJ SOLN
10.0000 mL | Freq: Once | INTRAMUSCULAR | Status: DC
  Filled 2019-10-10: qty 1

## 2019-10-10 MED ORDER — ACETAMINOPHEN 500 MG PO TABS
1000.0000 mg | ORAL_TABLET | Freq: Once | ORAL | Status: AC
  Administered 2019-10-10: 1000 mg via ORAL
  Filled 2019-10-10: qty 2

## 2019-10-10 NOTE — ED Triage Notes (Signed)
PT BIB EMS from dr office due to trip and falling. Pt denies any loc. Pt remembers the event. Pt is on blood thinners. Pt fell face first and hit her head on glass.

## 2019-10-10 NOTE — Discharge Instructions (Addendum)
You received dissolvable stiches to close the laceration to your face. Please follow up with PCP for any signs of infection including fever, redness, pus drainage from wound.

## 2019-10-10 NOTE — Progress Notes (Signed)
Orthopedic Tech Progress Note Patient Details:  Diane Barker 03/02/1951 [id number] Level 2 Trauma. needed Patient ID: Diane Barker, female   DOB: 10-04-1950, 69 y.o.   MRN: [id number]   [name] 10/10/2019, 4:16 PM

## 2019-10-10 NOTE — Progress Notes (Signed)
   10/10/19 1538  Clinical Encounter Type  Visited With Health care provider  Visit Type ED;Trauma  Referral From Nurse  Consult/Referral To Chaplain  Advance Directives (For Healthcare)  Does Patient Have a Medical Advance Directive? No  Mental Health Advance Directives  Does Patient Have a Mental Health Advance Directive? No  Would patient like information on creating a mental health advance directive? No - Patient declined   Chaplain responded to level 2 trauma. Pt alert and communicating. Medical team evaluating. Chaplains remain available for support as needs arise.   Chaplain Resident, Evelene Croon, MDiv [phone] on-call pager

## 2019-10-10 NOTE — ED Provider Notes (Signed)
[facility] Provider Note   CSN: [id number] Arrival date & time: 10/10/19  1535     History Chief Complaint  Patient presents with  . Fall    LEVEL 2    Diane Barker is a 69 y.o. female.  The history is provided by the patient and the EMS personnel.  Trauma Mechanism of injury: fall Injury location: head/neck Injury location detail: head (Pt hit head after falling forward off curb) Incident location: outdoors (Pt was walking into outpatient physician office) Arrived directly from scene: yes   Fall:      Fall occurred: walking      Impact surface: door.      Point of impact: head      Suspicion of alcohol use: no      Suspicion of drug use: no  EMS/PTA data:      Ambulatory at scene: yes      Blood loss: minimal      Responsiveness: alert      Oriented to: person, place, situation and time      Loss of consciousness: no      Amnesic to event: no  Current symptoms:      Associated symptoms:            Reports headache.            Denies abdominal pain, back pain, blindness, chest pain, difficulty breathing, loss of consciousness, nausea, neck pain and vomiting.            Pt endorses chest tenderness following prior fall last week  Relevant PMH:      Pharmacological risk factors:            Anticoagulation therapy.            Xarelto      Tetanus status: unknown      History reviewed. No pertinent past medical history.  There are no problems to display for this patient.   History reviewed. No pertinent surgical history.   OB History   No obstetric history on file.     History reviewed. No pertinent family history.  Social History   Tobacco Use  . Smoking status: Never Smoker  . Smokeless tobacco: Never Used  Substance Use Topics  . Alcohol use: Not on file  . Drug use: Not on file    Home Medications Prior to Admission medications   Medication Sig Start Date End Date Taking? Authorizing Provider    albuterol (VENTOLIN HFA) 108 (90 Base) MCG/ACT inhaler Inhale 1-2 puffs into the lungs every 6 (six) hours as needed for wheezing or shortness of breath.   Yes [provider]  atorvastatin (LIPITOR) 40 MG tablet Take 40 mg by mouth every evening.   Yes [provider]  budesonide-formoterol (SYMBICORT) 80-4.5 MCG/ACT inhaler Inhale 2 puffs into the lungs 2 (two) times daily.   Yes [provider]  dexlansoprazole (DEXILANT) 60 MG capsule Take 60 mg by mouth daily.   Yes [provider]  isosorbide mononitrate (IMDUR) 60 MG 24 hr tablet Take 60 mg by mouth daily.   Yes [provider]  ketorolac (ACULAR) 0.5 % ophthalmic solution Place 1 drop into the left eye 4 (four) times daily.   Yes [provider]  nitroGLYCERIN (NITROSTAT) 0.4 MG SL tablet Place 0.4 mg under the tongue every 5 (five) minutes x 3 doses as needed for chest pain.   Yes [provider]  prednisoLONE acetate (PRED FORTE)  1 % ophthalmic suspension Place 1 drop into the left eye 4 (four) times daily.   Yes [provider]  rivaroxaban (XARELTO) 20 MG TABS tablet Take 20 mg by mouth daily with supper.   Yes [provider]  traMADol (ULTRAM) 50 MG tablet Take 50 mg by mouth 2 (two) times daily as needed for moderate pain or severe pain.   Yes [provider]    Allergies    Erythromycin, Gabapentin, Morphine and related, Penicillins, and Relafen [nabumetone]  Review of Systems   Review of Systems  Constitutional: Negative for activity change.  HENT: Negative for congestion, rhinorrhea and sore throat.   Eyes: Negative for blindness, photophobia and visual disturbance.  Respiratory: Negative for cough and shortness of breath.   Cardiovascular: Negative for chest pain and leg swelling.  Gastrointestinal: Negative for abdominal pain, nausea and vomiting.  Musculoskeletal: Negative for back pain and neck pain.  Skin: Positive for wound.  Negative for rash.  Neurological: Positive for headaches. Negative for dizziness, loss of consciousness and syncope.  Psychiatric/Behavioral: Negative.     Physical Exam Updated Vital Signs BP (!) 160/79   Pulse 76   Temp 98 F (36.7 C) (Oral)   Resp 23   SpO2 94%   Physical Exam Vitals and nursing note reviewed.  Constitutional:      General: She is not in acute distress.    Appearance: Normal appearance. She is obese. She is not ill-appearing, toxic-appearing or diaphoretic.  HENT:     Head: Normocephalic.     Comments: Pt with 1 cm laceration lateral to L eye     Nose: Nose normal.     Mouth/Throat:     Mouth: Mucous membranes are moist.  Eyes:     General:        Right eye: No discharge.        Left eye: No discharge.     Extraocular Movements: Extraocular movements intact.     Pupils: Pupils are equal, round, and reactive to light.  Cardiovascular:     Rate and Rhythm: Normal rate and regular rhythm.     Pulses: Normal pulses.     Heart sounds: Normal heart sounds.  Pulmonary:     Effort: No respiratory distress.     Breath sounds: Normal breath sounds. No wheezing or rhonchi.     Comments: Mild chest tenderness to palpation  Chest:     Chest wall: Tenderness present.  Abdominal:     General: There is no distension.     Palpations: Abdomen is soft.     Tenderness: There is no abdominal tenderness. There is no guarding or rebound.  Musculoskeletal:        General: No tenderness.     Cervical back: Normal range of motion. No tenderness.     Right lower leg: No edema.     Left lower leg: No edema.  Skin:    General: Skin is warm.  Neurological:     General: No focal deficit present.     Mental Status: She is alert and oriented to person, place, and time. Mental status is at baseline.     Cranial Nerves: No cranial nerve deficit.     Sensory: No sensory deficit.     Motor: No weakness.  Psychiatric:        Mood and Affect: Mood normal.        Behavior:  Behavior normal.        Thought Content: Thought content  normal.        Judgment: Judgment normal.     ED Results / Procedures / Treatments   Labs (all labs ordered are listed, but only abnormal results are displayed) Labs Reviewed  BASIC METABOLIC PANEL - Abnormal; Notable for the following components:      Result Value   Glucose, Bld 105 (*)    Creatinine, Ser 1.14 (*)    GFR calc non Af Amer 49 (*)    GFR calc Af Amer 56 (*)    All other components within normal limits  CBC    EKG EKG Interpretation  Date/Time:  Monday October 10 2019 15:48:48 EDT Ventricular Rate:  79 PR Interval:    QRS Duration: 94 QT Interval:  372 QTC Calculation: 427 R Axis:   -7 Text Interpretation: Sinus rhythm Ventricular premature complex Consider left atrial enlargement Low voltage, precordial leads LVH by voltage Anterior Q waves, possibly due to LVH No significant change since last tracing Confirmed by Deno Etienne [phone]) on 10/10/2019 4:21:50 PM   Radiology CT Head Wo Contrast  Result Date: 10/10/2019 CLINICAL DATA:  Fall striking left side of forehead. EXAM: CT HEAD WITHOUT CONTRAST TECHNIQUE: Contiguous axial images were obtained from the base of the skull through the vertex without intravenous contrast. COMPARISON:  Report from brain MRI 01/16/2018. FINDINGS: Brain: No intracranial hemorrhage, mass effect, or midline shift. No hydrocephalus. The basilar cisterns are patent. No evidence of territorial infarct or acute ischemia. No extra-axial or intracranial fluid collection. Vascular: Atherosclerosis of skullbase vasculature without hyperdense vessel or abnormal calcification. Skull: No fracture or focal lesion. Sinuses/Orbits: Left periorbital laceration and hematoma. No evidence of orbital or facial bone fracture. Paranasal sinuses are clear. No mastoid effusion. Bilateral cataract resection. Other: Left periorbital laceration and hematoma. IMPRESSION: Left periorbital laceration and hematoma. No  acute intracranial abnormality. No skull fracture. Electronically Signed   By: Keith Rake M.D.   On: 10/10/2019 17:48   CT Cervical Spine Wo Contrast  Result Date: 10/10/2019 CLINICAL DATA:  Fall striking left side of forehead. EXAM: CT CERVICAL SPINE WITHOUT CONTRAST TECHNIQUE: Multidetector CT imaging of the cervical spine was performed without intravenous contrast. Multiplanar CT image reconstructions were also generated. COMPARISON:  Cervical MRI 01/16/2018. FINDINGS: Alignment: Normal. Skull base and vertebrae: No acute fracture. Vertebral body heights are maintained. The dens and skull base are intact. Hemangioma within C7 vertebral body, incidental. C1-C2 degenerative change with subchondral sclerosis and cystic change. Soft tissues and spinal canal: No prevertebral fluid or swelling. No visible canal hematoma. Disc levels: Endplate spurring at multiple levels. Mild disc space narrowing at C5-C6. Scattered facet hypertrophy. Upper chest: No acute findings. Right-sided aortic arch with aberrant left subclavian artery, previously seen on chest CTA 06/21/2013. Other: None. IMPRESSION: Mild degenerative change in the cervical spine without acute fracture or subluxation. Electronically Signed   By: Keith Rake M.D.   On: 10/10/2019 17:52   DG Chest Portable 1 View  Result Date: 10/10/2019 CLINICAL DATA:  69 year old female with fall. EXAM: PORTABLE CHEST 1 VIEW COMPARISON:  Chest radiograph dated 01/15/2018. FINDINGS: No focal consolidation, pleural effusion, or pneumothorax. Stable cardiac silhouette. No acute osseous pathology. Scoliosis. IMPRESSION: No active cardiopulmonary disease.  No interval change. Electronically Signed   By: Anner Crete M.D.   On: 10/10/2019 16:11    Procedures .Marland KitchenLaceration Repair  Date/Time: 10/10/2019 6:55 PM Performed by: Kennyth Lose, MD Authorized by: Deno Etienne, DO   Consent:    Consent obtained:  Verbal  Consent given by:  Patient Anesthesia  (see MAR for exact dosages):    Anesthesia method:  Topical application and local infiltration   Topical anesthetic:  LET   Local anesthetic:  Lidocaine 1% WITH epi Laceration details:    Location:  Face   Face location:  L eyebrow   Length (cm):  1 Repair type:    Repair type:  Simple Exploration:    Hemostasis achieved with:  LET and epinephrine   Wound exploration: entire depth of wound probed and visualized     Contaminated: no   Treatment:    Area cleansed with:  Saline   Amount of cleaning:  Standard   Irrigation solution:  Sterile saline Skin repair:    Repair method:  Sutures   Suture size:  5-0   Suture material:  Fast-absorbing gut   Suture technique:  Simple interrupted   Number of sutures:  6 Approximation:    Approximation:  Close Post-procedure details:    Dressing:  Antibiotic ointment   Patient tolerance of procedure:  Tolerated well, no immediate complications   (including critical care time)  Medications Ordered in ED Medications  lidocaine-EPINEPHrine (XYLOCAINE W/EPI) 1 %-1:100000 (with pres) injection 10 mL (has no administration in time range)  lidocaine-EPINEPHrine-tetracaine (LET) topical gel (3 mLs Topical Given 10/10/19 1652)  acetaminophen (TYLENOL) tablet 1,000 mg (1,000 mg Oral Given 10/10/19 1700)    ED Course  I have reviewed the triage vital signs and the nursing notes.  Pertinent labs & imaging results that were available during my care of the patient were reviewed by me and considered in my medical decision making (see chart for details).    MDM Rules/Calculators/A&P                          Jolayne Branson is a 69 y.o. female who presents as a level 2 trauma activation after sustaining injuries in a fall earlier this afternoon. History is obtained from EMS as well as the patient. Briefly, the patient was walking into outpatient clinic when she tripped over curb falling forward and hitting head on door. Denies CP, SOB prior to fall. Story  consistent with mechanical fall. No LOC. On initial exam patient was GCS 15 ABC intact, and hemodynamically stable. The patient complained of pain to head, specifically just lateral to L eye in area of laceration. On examination of laceration ~1cm that was oozing. Patient with intact EOM. CN II-XII grossly intact.  Due to fall on [facility], CT head/C-spine were completed that were not significant for intracranial bleeding/acute traumatic injury.  CBC, BMP also completed without significant abnormalities.  Full physical examination was completed per above, no other evidence of traumatic injury present that requires work-up. While in the ED, the patient received 1 g Tylenol for pain.  Patient initially refusing laceration repair with sutures.  Pressure applied to wound to achieve hemostasis.  Let gel was then applied.  Unfortunately wound continued to ooze requiring repair of laceration with sutures.  Laceration was repaired per procedure note above.  Patient tolerated event without acute complication. Based on the above findings, I believe patient is hemodynamically stable for discharge. Patient and family educated about return precautions and suture care. Patient and family educated about follow-up with PCP as needed. Patient and family expressed understanding of return precautions and need for follow-up. Patient discharged.   Final Clinical Impression(s) / ED Diagnoses Final diagnoses:  Fall, initial encounter  Facial laceration, initial encounter  Rx / DC Orders ED Discharge Orders    None       Ryann Leavitt, Ivar Drape, MD 10/10/19 [place], [place], DO 10/11/19 1338

## 2019-10-11 ENCOUNTER — Encounter: Payer: Self-pay | Admitting: Internal Medicine

## 2019-10-11 ENCOUNTER — Telehealth: Payer: Self-pay

## 2019-10-11 NOTE — Telephone Encounter (Signed)
Called pt to check on her due to fall that occurred yesterday while reporting to office for follow up appointment. Pt voiced she doesn't really remember what happened and think she may have black out prior to stepping on curb. Pt voiced she feels fine today just sore and in pain from the fall.  Nurse scheduled a f/u appointment with Dr. Margaretann Loveless on Aug 3. 2021.

## 2019-10-18 ENCOUNTER — Encounter: Payer: Self-pay | Admitting: Internal Medicine

## 2019-10-18 ENCOUNTER — Other Ambulatory Visit: Payer: Self-pay

## 2019-10-18 ENCOUNTER — Ambulatory Visit (INDEPENDENT_AMBULATORY_CARE_PROVIDER_SITE_OTHER): Payer: 59 | Admitting: Internal Medicine

## 2019-10-18 ENCOUNTER — Encounter: Payer: Self-pay | Admitting: *Deleted

## 2019-10-18 VITALS — BP 126/76 | HR 68 | Ht 65.0 in | Wt 255.4 lb

## 2019-10-18 DIAGNOSIS — Z79899 Other long term (current) drug therapy: Secondary | ICD-10-CM | POA: Diagnosis not present

## 2019-10-18 DIAGNOSIS — R4589 Other symptoms and signs involving emotional state: Secondary | ICD-10-CM

## 2019-10-18 DIAGNOSIS — R55 Syncope and collapse: Secondary | ICD-10-CM | POA: Diagnosis not present

## 2019-10-18 DIAGNOSIS — I2510 Atherosclerotic heart disease of native coronary artery without angina pectoris: Secondary | ICD-10-CM | POA: Diagnosis not present

## 2019-10-18 DIAGNOSIS — I208 Other forms of angina pectoris: Secondary | ICD-10-CM | POA: Diagnosis not present

## 2019-10-18 DIAGNOSIS — Z86711 Personal history of pulmonary embolism: Secondary | ICD-10-CM

## 2019-10-18 DIAGNOSIS — Z86718 Personal history of other venous thrombosis and embolism: Secondary | ICD-10-CM

## 2019-10-18 NOTE — Progress Notes (Signed)
Patient ID: Diane Barker, female   DOB: 1950-06-19, 69 y.o.   MRN: [id number] Patient enrolled for [employer] to ship a 30 day cardiac event monitor to [place], [place], [place] [address].

## 2019-10-18 NOTE — Progress Notes (Signed)
Cardiology Office Note:    Date:  10/18/2019   ID:  Diane Barker, DOB 1950/11/26, MRN [id number]  PCP:  Shon Baton, MD  Cardiologist:  No primary care provider on file.  Electrophysiologist:  None   Referring MD: Shon Baton, MD   Chief Complaint: unexplained falls, CAD  History of Present Illness:    Diane Barker is a 69 y.o. female with a history of coronary artery disease who presents for follow-up.  She was originally scheduled for follow-up last week but on her way into the office experienced a fall in the parking garage.  She remembers falling but does not remember hitting the ground.  She sustained an facial laceration.  She takes chronic anticoagulation for PE, CT head and CT C-spine were negative.  She notes 1 prior episode of similar symptoms.  She was carrying something heavy in her yard.  She again says she knows that she was falling but does not remember hitting the ground.  We participated in shared decision making regarding a cardiac etiology for this symptom.  She denies palpitations, dizziness, lightheadedness.  She has not had prior episodes of syncope.  She brought her granddaughter with her today.  She denies chest pain today.  She does on occasion have episodes of chest pain, we have discussed this over time and she defers cardiac catheterization at this time and prefers medical management.   Past Medical History:  Diagnosis Date   Anxiety    Arthritis    "all over my body" (01/15/2018)   Breast cancer, right breast ([place]) 2000   Rt lumpectomy, Chemo/XRT/rt axillary node    Bronchial asthma    Chronic lower back pain    Depression    Diverticulosis    DVT (deep venous thrombosis) ([place]) 06/2013   RLE   GERD (gastroesophageal reflux disease)    High cholesterol    Hx of adenomatous colonic polyps    Hypertension    Obesity    Osteoarthritis    Personal history of chemotherapy    Personal history of radiation therapy    Pulmonary embolism  ([place]) 06/2013   "both lungs"   Spondylolisthesis     Past Surgical History:  Procedure Laterality Date   BREAST BIOPSY Right 2000   BREAST LUMPECTOMY Right 2000   for CA txed w/ chemo and radiation, right   CARDIAC CATHETERIZATION     HERNIA REPAIR     INTRAVASCULAR PRESSURE WIRE/FFR STUDY N/A 01/18/2018   Procedure: INTRAVASCULAR PRESSURE WIRE/FFR STUDY;  Surgeon: Burnell Blanks, MD;  Location: [facility];  Service: Cardiovascular;  Laterality: N/A;   LEFT HEART CATH AND CORONARY ANGIOGRAPHY N/A 01/18/2018   Procedure: LEFT HEART CATH AND CORONARY ANGIOGRAPHY;  Surgeon: Burnell Blanks, MD;  Location: [facility];  Service: Cardiovascular;  Laterality: N/A;   TUBAL LIGATION     UMBILICAL HERNIA REPAIR  1983    Current Medications: Current Meds  Medication Sig   acetaminophen (TYLENOL) 500 MG tablet Take 500 mg by mouth every 6 (six) hours as needed for mild pain.   albuterol (VENTOLIN HFA) 108 (90 Base) MCG/ACT inhaler Inhale 1-2 puffs into the lungs every 6 (six) hours as needed for wheezing or shortness of breath.   aspirin 81 MG EC tablet Take 1 tablet (81 mg total) by mouth daily.   atorvastatin (LIPITOR) 40 MG tablet Take 40 mg by mouth every evening.   budesonide-formoterol (SYMBICORT) 80-4.5 MCG/ACT inhaler Inhale 2 puffs, then rinse  mouth, twice daily maintenance   budesonide-formoterol (SYMBICORT) 80-4.5 MCG/ACT inhaler Inhale 2 puffs into the lungs 2 (two) times daily.   Cholecalciferol (VITAMIN D) 2000 UNITS CAPS Take 4,000 Units by mouth daily.    clotrimazole (CLOTRIMAZOLE ANTI-FUNGAL) 1 % cream Apply 1 application topically 2 (two) times daily.   dexlansoprazole (DEXILANT) 60 MG capsule Take 60 mg by mouth daily.   dexlansoprazole (DEXILANT) 60 MG capsule Take 60 mg by mouth daily.   escitalopram (LEXAPRO) 10 MG tablet Take 20 mg by mouth.    furosemide (LASIX) 20 MG tablet Take 1 tablet (20 mg total) by mouth daily.     isosorbide mononitrate (IMDUR) 60 MG 24 hr tablet Take 1 tablet (60 mg total) by mouth daily.   isosorbide mononitrate (IMDUR) 60 MG 24 hr tablet Take 60 mg by mouth daily.   ketorolac (ACULAR) 0.5 % ophthalmic solution Place 1 drop into the left eye 4 (four) times daily.   nitroGLYCERIN (NITROSTAT) 0.4 MG SL tablet Place 1 tablet (0.4 mg total) under the tongue every 5 (five) minutes as needed.   prednisoLONE acetate (PRED FORTE) 1 % ophthalmic suspension Place 1 drop into the left eye 4 (four) times daily.   PROAIR HFA 108 (90 Base) MCG/ACT inhaler Inhale 2 puffs into the lungs every 4 (four) hours as needed for wheezing or shortness of breath.   rivaroxaban (XARELTO) 20 MG TABS tablet Take 20 mg by mouth daily with supper.   Spacer/Aero-Holding Chambers (AEROCHAMBER MV) inhaler Use as instructed   traMADol (ULTRAM) 50 MG tablet Take 50 mg by mouth 2 (two) times daily as needed for moderate pain or severe pain.   vitamin B-12 (CYANOCOBALAMIN) 500 MCG tablet Take 500 mcg by mouth daily.   [DISCONTINUED] atorvastatin (LIPITOR) 40 MG tablet Take 1 tablet (40 mg total) by mouth daily at 6 PM.   [DISCONTINUED] ipratropium-albuterol (DUONEB) 0.5-2.5 (3) MG/3ML SOLN Take 3 mLs by nebulization every 6 (six) hours as needed.   [DISCONTINUED] methylPREDNISolone (MEDROL) 4 MG tablet Take as directed   [DISCONTINUED] nitroGLYCERIN (NITROSTAT) 0.4 MG SL tablet Place 0.4 mg under the tongue every 5 (five) minutes x 3 doses as needed for chest pain.   [DISCONTINUED] XARELTO 20 MG TABS tablet Take 1 tablet by mouth daily.     Allergies:   Erythromycin, Erythromycin, Gabapentin, Gabapentin, Morphine, Morphine and related, Penicillins, Penicillins, and Relafen [nabumetone]   Social History   Socioeconomic History   Marital status: Single    Spouse name: Not on file   Number of children: 2   Years of education: 10   Highest education level: Not on file  Occupational History    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: [employer]  Tobacco Use   Smoking status: Never Smoker   Smokeless tobacco: Never Used  Scientific laboratory technician Use: Never used  Substance and Sexual Activity   Alcohol use: Never    Alcohol/week: 0.0 standard drinks   Drug use: Never   Sexual activity: Not Currently  Other Topics Concern   Not on file  Social History Narrative   ** Merged History Encounter **       Lives at home alone Caffeine use- soda 16-24 oz weekly   Social Determinants of Health   Financial Resource Strain:    Difficulty of Paying Living Expenses:   Food Insecurity:    Worried About Estate manager/land agent of Food in the Last Year:    Ran  Out of Food in the Last Year:   Transportation Needs:    Lack of Transportation (Medical):    Lack of Transportation (Non-Medical):   Physical Activity:    Days of Exercise per Week:    Minutes of Exercise per Session:   Stress:    Feeling of Stress :   Social Connections:    Frequency of Communication with Friends and Family:    Frequency of Social Gatherings with Friends and Family:    Attends Religious Services:    Active Member of Clubs or Organizations:    Attends Music therapist:    Marital Status:      Family History: The patient's family history includes Asthma in her daughter and mother; Breast cancer in her maternal aunt; COPD in her mother; Colon cancer in her paternal aunt; Diabetes in her brother, daughter, and mother; Heart attack in her brother and mother; Hypertension in her brother; Kidney failure in her brother; Lung cancer in her father.  ROS:   Please see the history of present illness.    All other systems reviewed and are negative.  EKGs/Labs/Other Studies Reviewed:    The following studies were reviewed today:  Recent Labs: 08/31/2019: ALT 18 10/10/2019: BUN 11; Creatinine, Ser 1.14; Hemoglobin 12.8; Platelets 222; Potassium 4.0; Sodium 139    Recent Lipid Panel    Component Value Date/Time   CHOL 182 01/16/2018 0238   TRIG 60 01/16/2018 0238   HDL 46 01/16/2018 0238   CHOLHDL 4.0 01/16/2018 0238   VLDL 12 01/16/2018 0238   LDLCALC 124 (H) 01/16/2018 0238    Physical Exam:    VS:  BP 126/76 (BP Location: Left Arm, Patient Position: Sitting, Cuff Size: Large)    Pulse 68    Ht 5\' 5"  (1.651 m)    Wt 255 lb 6.4 oz (115.8 kg)    BMI 42.50 kg/m     Wt Readings from Last 5 Encounters:  10/18/19 255 lb 6.4 oz (115.8 kg)  09/01/19 254 lb 6.4 oz (115.4 kg)  08/31/19 250 lb (113.4 kg)  07/06/19 253 lb (114.8 kg)  01/17/19 259 lb 1.6 oz (117.5 kg)     Constitutional: No acute distress Eyes: sclera non-icteric, normal conjunctiva and lids ENMT: normal dentition, moist mucous membranes Cardiovascular: regular rhythm, normal rate, no murmurs. S1 and S2 normal. Radial pulses normal bilaterally. No jugular venous distention.  Respiratory: clear to auscultation bilaterally GI : normal bowel sounds, soft and nontender. No distention.   MSK: extremities warm, well perfused. No edema.  NEURO: grossly nonfocal exam, moves all extremities. PSYCH: alert and oriented x 3, normal mood and affect.   ASSESSMENT:    1. Syncope and collapse   2. Stable angina ([place])   3. Coronary artery disease involving native coronary artery of native heart without angina pectoris   4. Medication management   5. History of pulmonary embolus (PE)   6. History of DVT (deep vein thrombosis)   7. Depressed mood    PLAN:    Syncope and collapse - Plan: CARDIAC EVENT MONITOR I am concerned about her episodes of syncope without clear cause.  She denies concomitant palpitations or chest pain, less likely related to hemodynamic cardiovascular issues.  Concern for rhythm related issue.  We will perform a cardiac event monitor.  I will follow up with her after this is completed.  Stable angina (HCC)-continues on aspirin 81 mg daily, atorvastatin 40 mg daily  Imdur 60 mg daily.  I have offered  to titrate her Imdur which she feels is not needed at this time.  She has a prescription for sublingual nitroglycerin.  Medication management  History of pulmonary embolus (PE) History of DVT (deep vein thrombosis) -Continues on rivaroxaban 20 mg daily.  Consider stopping aspirin in the setting of chronic anticoagulation with DOAC.  Depressed mood-did not respond well to beta-blockade.  Total time of encounter: 30 minutes total time of encounter, including 20 minutes spent in face-to-face patient care on the date of this encounter. This time includes coordination of care and counseling regarding above mentioned problem list. Remainder of non-face-to-face time involved reviewing chart documents/testing relevant to the patient encounter and documentation in the medical record. I have independently reviewed documentation from referring provider.   Cherlynn Kaiser, MD [place]   [facility]    Medication Adjustments/Labs and Tests Ordered: Current medicines are reviewed at length with the patient today.  Concerns regarding medicines are outlined above.  Orders Placed This Encounter  Procedures   CARDIAC EVENT MONITOR   No orders of the defined types were placed in this encounter.   Patient Instructions  Medication Instructions:  Continue taking Lasix Everyday 20mg  *If you need a refill on your cardiac medications before your next appointment, please call your pharmacy*   Lab Work: None Ordered If you have labs (blood work) drawn today and your tests are completely normal, you will receive your results only by:  MyChart Message (if you have MyChart) OR  A paper copy in the mail If you have any lab test that is abnormal or we need to change your treatment, we will call you to review the results.   Testing/Procedures:  Your physician has recommended that you wear an 30 day event monitor. Event monitors are medical devices that record the  hearts electrical activity. Doctors most often [place] these monitors to diagnose arrhythmias. Arrhythmias are problems with the speed or rhythm of the heartbeat. The monitor is a small, portable device. You can wear one while you do your normal daily activities. This is usually used to diagnose what is causing palpitations/syncope (passing out).    Follow-Up: At [facility], you and your health needs are our priority.  As part of our continuing mission to provide you with exceptional heart care, we have created designated Provider Care Teams.  These Care Teams include your primary Cardiologist (physician) and Advanced Practice Providers (APPs -  Physician Assistants and Nurse Practitioners) who all work together to provide you with the care you need, when you need it.  We recommend signing up for the patient portal called "MyChart".  Sign up information is provided on this After Visit Summary.  MyChart is used to connect with patients for Virtual Visits (Telemedicine).  Patients are able to view lab/test results, encounter notes, upcoming appointments, etc.  Non-urgent messages can be sent to your provider as well.   To learn more about what you can do with MyChart, go to [email or web address].    Your next appointment:   2 month(s)  The format for your next appointment:   In Person  Provider:   Cherlynn Kaiser, MD   Other Instructions  [employer] Cardiac Event Monitor Instructions Your physician has requested you wear your cardiac event monitor for 30 days, [employer] may call or text to confirm a shipping address. The monitor will be sent to a land address via UPS. [employer] will not ship a monitor to a PO BOX. It typically takes 3-5 days to receive your monitor after  it has been enrolled. [employer] will assist with USPS tracking if your package is delayed. The telephone number for [employer] is [phone]. Once you have received your monitor, please review the enclosed  instructions. Instruction tutorials can also be viewed under help and settings on the enclosed cell phone. Your monitor has already been registered assigning a specific monitor serial # to you.  Applying the monitor Remove cell phone from case and turn it on. The cell phone works as Dealer and needs to be within [employer] of you at all times. The cell phone will need to be charged on a daily basis. We recommend you plug the cell phone into the enclosed charger at your bedside table every night.  Monitor batteries: You will receive two monitor batteries labelled #1 and #2. These are your recorders. Plug battery #2 onto the second connection on the enclosed charger. Keep one battery on the charger at all times. This will keep the monitor battery deactivated. It will also keep it fully charged for when you need to switch your monitor batteries. A small light will be blinking on the battery emblem when it is charging. The light on the battery emblem will remain on when the battery is fully charged.  Open package of a Monitor strip. Insert battery #1 into black hood on strip and gently squeeze monitor battery onto connection as indicated in instruction booklet. Set aside while preparing skin.  Choose location for your strip, vertical or horizontal, as indicated in the instruction booklet. Shave to remove all hair from location. There cannot be any lotions, oils, powders, or colognes on skin where monitor is to be applied. Wipe skin clean with enclosed Saline wipe. Dry skin completely.  Peel paper labeled #1 off the back of the Monitor strip exposing the adhesive. Place the monitor on the chest in the vertical or horizontal position shown in the instruction booklet. One arrow on the monitor strip must be pointing upward. Carefully remove paper labeled #2, attaching remainder of strip to your skin. Try not to create any folds or wrinkles in the strip as you apply it.  Firmly press and  release the circle in the center of the monitor battery. You will hear a small beep. This is turning the monitor battery on. The heart emblem on the monitor battery will light up every 5 seconds if the monitor battery in turned on and connected to the patient securely. Do not push and hold the circle down as this turns the monitor battery off. The cell phone will locate the monitor battery. A screen will appear on the cell phone checking the connection of your monitor strip. This may read poor connection initially but change to good connection within the next minute. Once your monitor accepts the connection you will hear a series of 3 beeps followed by a climbing crescendo of beeps. A screen will appear on the cell phone showing the two monitor strip placement options. Touch the picture that demonstrates where you applied the monitor strip.  Your monitor strip and battery are waterproof. You are able to shower, bathe, or swim with the monitor on. They just ask you do not submerge deeper than 3 feet underwater. We recommend removing the monitor if you are swimming in a lake, river, or ocean.  Your monitor battery will need to be switched to a fully charged monitor battery approximately once a week. The cell phone will alert you of an action which needs to be made.  On the  cell phone, tap for details to reveal connection status, monitor battery status, and cell phone battery status. The green dots indicates your monitor is in good status. A red dot indicates there is something that needs your attention.  To record a symptom, click the circle on the monitor battery. In 30-60 seconds a list of symptoms will appear on the cell phone. Select your symptom and tap save. Your monitor will record a sustained or significant arrhythmia regardless of you clicking the button. Some patients do not feel the heart rhythm irregularities. [employer] will notify us of any serious or critical events.  Refer to  instruction booklet for instructions on switching batteries, changing strips, the Do not disturb or Pause features, or any additional questions.  Call [employer] at [phone], to confirm your monitor is transmitting and record your baseline. They will answer any questions you may have regarding the monitor instructions at that time.  Returning the monitor to [place] all equipment back into blue box. Peel off strip of paper to expose adhesive and close box securely. There is a prepaid UPS shipping label on this box. Drop in a UPS drop box, or at a UPS facility like [employer]. You may also contact [employer] to arrange UPS to pick up monitor package at your home.

## 2019-10-18 NOTE — Patient Instructions (Addendum)
Medication Instructions:  Continue taking Lasix Everyday 20mg  *If you need a refill on your cardiac medications before your next appointment, please call your pharmacy*   Lab Work: None Ordered If you have labs (blood work) drawn today and your tests are completely normal, you will receive your results only by: Marland Kitchen MyChart Message (if you have MyChart) OR . A paper copy in the mail If you have any lab test that is abnormal or we need to change your treatment, we will call you to review the results.   Testing/Procedures:  Your physician has recommended that you wear an 30 day event monitor. Event monitors are medical devices that record the heart's electrical activity. Doctors most often [place] these monitors to diagnose arrhythmias. Arrhythmias are problems with the speed or rhythm of the heartbeat. The monitor is a small, portable device. You can wear one while you do your normal daily activities. This is usually used to diagnose what is causing palpitations/syncope (passing out).    Follow-Up: At [facility], you and your health needs are our priority.  As part of our continuing mission to provide you with exceptional heart care, we have created designated Provider Care Teams.  These Care Teams include your primary Cardiologist (physician) and Advanced Practice Providers (APPs -  Physician Assistants and Nurse Practitioners) who all work together to provide you with the care you need, when you need it.  We recommend signing up for the patient portal called "MyChart".  Sign up information is provided on this After Visit Summary.  MyChart is used to connect with patients for Virtual Visits (Telemedicine).  Patients are able to view lab/test results, encounter notes, upcoming appointments, etc.  Non-urgent messages can be sent to your provider as well.   To learn more about what you can do with MyChart, go to [email or web address].    Your next appointment:   2 month(s)  The format for your  next appointment:   In Person  Provider:   Cherlynn Kaiser, MD   Other Instructions  [employer] Cardiac Event Monitor Instructions Your physician has requested you wear your cardiac event monitor for 30 days, [employer] may call or text to confirm a shipping address. The monitor will be sent to a land address via UPS. [employer] will not ship a monitor to a PO BOX. It typically takes 3-5 days to receive your monitor after it has been enrolled. [employer] will assist with USPS tracking if your package is delayed. The telephone number for [employer] is [phone]. Once you have received your monitor, please review the enclosed instructions. Instruction tutorials can also be viewed under help and settings on the enclosed cell phone. Your monitor has already been registered assigning a specific monitor serial # to you.  Applying the monitor Remove cell phone from case and turn it on. The cell phone works as Dealer and needs to be within [employer] of you at all times. The cell phone will need to be charged on a daily basis. We recommend you plug the cell phone into the enclosed charger at your bedside table every night.  Monitor batteries: You will receive two monitor batteries labelled #1 and #2. These are your recorders. Plug battery #2 onto the second connection on the enclosed charger. Keep one battery on the charger at all times. This will keep the monitor battery deactivated. It will also keep it fully charged for when you need to switch your monitor batteries. A small light will be blinking on the battery emblem  when it is charging. The light on the battery emblem will remain on when the battery is fully charged.  Open package of a Monitor strip. Insert battery #1 into black hood on strip and gently squeeze monitor battery onto connection as indicated in instruction booklet. Set aside while preparing skin.  Choose location for your strip, vertical or horizontal, as  indicated in the instruction booklet. Shave to remove all hair from location. There cannot be any lotions, oils, powders, or colognes on skin where monitor is to be applied. Wipe skin clean with enclosed Saline wipe. Dry skin completely.  Peel paper labeled #1 off the back of the Monitor strip exposing the adhesive. Place the monitor on the chest in the vertical or horizontal position shown in the instruction booklet. One arrow on the monitor strip must be pointing upward. Carefully remove paper labeled #2, attaching remainder of strip to your skin. Try not to create any folds or wrinkles in the strip as you apply it.  Firmly press and release the circle in the center of the monitor battery. You will hear a small beep. This is turning the monitor battery on. The heart emblem on the monitor battery will light up every 5 seconds if the monitor battery in turned on and connected to the patient securely. Do not push and hold the circle down as this turns the monitor battery off. The cell phone will locate the monitor battery. A screen will appear on the cell phone checking the connection of your monitor strip. This may read poor connection initially but change to good connection within the next minute. Once your monitor accepts the connection you will hear a series of 3 beeps followed by a climbing crescendo of beeps. A screen will appear on the cell phone showing the two monitor strip placement options. Touch the picture that demonstrates where you applied the monitor strip.  Your monitor strip and battery are waterproof. You are able to shower, bathe, or swim with the monitor on. They just ask you do not submerge deeper than 3 feet underwater. We recommend removing the monitor if you are swimming in a lake, river, or ocean.  Your monitor battery will need to be switched to a fully charged monitor battery approximately once a week. The cell phone will alert you of an action which needs to be  made.  On the cell phone, tap for details to reveal connection status, monitor battery status, and cell phone battery status. The green dots indicates your monitor is in good status. A red dot indicates there is something that needs your attention.  To record a symptom, click the circle on the monitor battery. In 30-60 seconds a list of symptoms will appear on the cell phone. Select your symptom and tap save. Your monitor will record a sustained or significant arrhythmia regardless of you clicking the button. Some patients do not feel the heart rhythm irregularities. [employer] will notify us of any serious or critical events.  Refer to instruction booklet for instructions on switching batteries, changing strips, the Do not disturb or Pause features, or any additional questions.  Call [employer] at [phone], to confirm your monitor is transmitting and record your baseline. They will answer any questions you may have regarding the monitor instructions at that time.  Returning the monitor to [place] all equipment back into blue box. Peel off strip of paper to expose adhesive and close box securely. There is a prepaid UPS shipping label on this box. Drop in  a UPS drop box, or at a UPS facility like [employer]. You may also contact [employer] to arrange UPS to pick up monitor package at your home.

## 2019-10-25 ENCOUNTER — Ambulatory Visit (INDEPENDENT_AMBULATORY_CARE_PROVIDER_SITE_OTHER): Payer: 59

## 2019-10-25 DIAGNOSIS — R55 Syncope and collapse: Secondary | ICD-10-CM | POA: Diagnosis not present

## 2019-11-10 ENCOUNTER — Telehealth: Payer: Self-pay | Admitting: Internal Medicine

## 2019-11-10 NOTE — Telephone Encounter (Signed)
Patient called and said she just feels extremely fatigued and weak. She had some pain around her heart but it wasn't like standard chest pain. When her arm started to hurt she took a nitro and it felt better.  She wanted to know if there was anything that was abnormal on her heart monitor from yesterday.  Please advise

## 2019-11-10 NOTE — Telephone Encounter (Signed)
Spoke with pt, yesterday about 4 pm she developed pain in her back that came around to her chest, under her left breast, armpit and down her left arm some. She took 3 NTG and it was beginning to ease after the 3rd one. She had no symptoms of SOB and it did not feel like the pain she had with her MI. She took 2 tylenol at 3 am and it seemed to help the pain. Today she reports she feels sore but can not really explained what kind of soreness. It does not change with movement or touch. She reports she still has sore ness from her fall and feels some of this maybe related to that. She would like to talk with dr Margaretann Loveless about this episode. Virtual appointment made with dr Margaretann Loveless next week. Patient voiced understanding to go to the ER if she develops pain again.

## 2019-11-15 ENCOUNTER — Telehealth (INDEPENDENT_AMBULATORY_CARE_PROVIDER_SITE_OTHER): Payer: 59 | Admitting: Internal Medicine

## 2019-11-15 DIAGNOSIS — I208 Other forms of angina pectoris: Secondary | ICD-10-CM

## 2019-11-15 DIAGNOSIS — Z86711 Personal history of pulmonary embolism: Secondary | ICD-10-CM

## 2019-11-15 DIAGNOSIS — I2510 Atherosclerotic heart disease of native coronary artery without angina pectoris: Secondary | ICD-10-CM

## 2019-11-15 DIAGNOSIS — R55 Syncope and collapse: Secondary | ICD-10-CM | POA: Diagnosis not present

## 2019-11-15 DIAGNOSIS — Z86718 Personal history of other venous thrombosis and embolism: Secondary | ICD-10-CM

## 2019-11-15 DIAGNOSIS — Z79899 Other long term (current) drug therapy: Secondary | ICD-10-CM

## 2019-11-15 NOTE — Progress Notes (Signed)
Virtual Visit via Telephone Note   This visit type was conducted due to national recommendations for restrictions regarding the COVID-19 Pandemic (e.g. social distancing) in an effort to limit this patient's exposure and mitigate transmission in our community.  Due to her co-morbid illnesses, this patient is at least at moderate risk for complications without adequate follow up.  This format is felt to be most appropriate for this patient at this time.  The patient did not have access to video technology/had technical difficulties with video requiring transitioning to audio format only (telephone).  All issues noted in this document were discussed and addressed.  No physical exam could be performed with this format.  Please refer to the patient's chart for her  consent to telehealth for [facility].    Date:  11/15/2019   ID:  Diane Barker, DOB Feb 03, 1951, MRN [id number] The patient was identified using 2 identifiers.  Patient Location: Home Provider Location: Home Office  PCP:  Shon Baton, MD  Cardiologist:  No primary care provider on file.  Electrophysiologist:  None   Evaluation Performed:  Follow-Up Visit  Chief Complaint:  Chest pain  History of Present Illness:    Diane Barker is a 69 y.o. female with coronary artery disease with stable angina who presents for follow-up of chest pain.  4 pm wed afternoon 11/09/2019-she was going out to eat.  She experienced chest pain that started substernal, and wrapped around in a bandlike fashion all the way to her back.  She felt the pain in her shoulder blades - took nitro x 3 and started going down left arm.  This did not relieve her pain.  She tells me she recalls my advice that if she had to take 3 nitroglycerin she should go to the emergency department if chest pain continued.  She chose not to go to the emergency department.  At 9 pm took two tylenol, and at that point her discomfort began to subside.  She and I have participated in  shared decision making over many months regarding timing of possible repeat coronary angiography as well as titration of medical therapy.  We have been relatively successful with medication titration for the treatment of stable coronary artery disease.  She has infrequent episodes of angina.  This was a more concerning episode.  I have offered the patient coronary angiography today.    She has had no significant shortness of breath after initiating Lasix therapy.  She has significant life stressors.  She is currently taking care of her daughter out of town due to medical need.  The patient does not have symptoms concerning for COVID-19 infection (fever, chills, cough, or new shortness of breath).    Past Medical History:  Diagnosis Date  . Anxiety   . Arthritis    "all over my body" (01/15/2018)  . Breast cancer, right breast ([place]) 2000   Rt lumpectomy, Chemo/XRT/rt axillary node   . Bronchial asthma   . Chronic lower back pain   . Depression   . Diverticulosis   . DVT (deep venous thrombosis) ([place]) 06/2013   RLE  . GERD (gastroesophageal reflux disease)   . High cholesterol   . Hx of adenomatous colonic polyps   . Hypertension   . Obesity   . Osteoarthritis   . Personal history of chemotherapy   . Personal history of radiation therapy   . Pulmonary embolism ([place]) 06/2013   "both lungs"  . Spondylolisthesis    Past Surgical History:  Procedure Laterality Date  . BREAST BIOPSY Right 2000  . BREAST LUMPECTOMY Right 2000   for CA txed w/ chemo and radiation, right  . CARDIAC CATHETERIZATION    . HERNIA REPAIR    . INTRAVASCULAR PRESSURE WIRE/FFR STUDY N/A 01/18/2018   Procedure: INTRAVASCULAR PRESSURE WIRE/FFR STUDY;  Surgeon: Burnell Blanks, MD;  Location: [place] [facility];  Service: Cardiovascular;  Laterality: N/A;  . LEFT HEART CATH AND CORONARY ANGIOGRAPHY N/A 01/18/2018   Procedure: LEFT HEART CATH AND CORONARY ANGIOGRAPHY;  Surgeon: Burnell Blanks, MD;   Location: [place] [facility];  Service: Cardiovascular;  Laterality: N/A;  . TUBAL LIGATION    . UMBILICAL HERNIA REPAIR  1983     Current Meds  Medication Sig  . acetaminophen (TYLENOL) 500 MG tablet Take 500 mg by mouth every 6 (six) hours as needed for mild pain.  Marland Kitchen albuterol (VENTOLIN HFA) 108 (90 Base) MCG/ACT inhaler Inhale 1-2 puffs into the lungs every 6 (six) hours as needed for wheezing or shortness of breath.  Marland Kitchen aspirin 81 MG EC tablet Take 1 tablet (81 mg total) by mouth daily.  Marland Kitchen atorvastatin (LIPITOR) 40 MG tablet Take 40 mg by mouth every evening.  . budesonide-formoterol (SYMBICORT) 80-4.5 MCG/ACT inhaler Inhale 2 puffs, then rinse mouth, twice daily maintenance  . Cholecalciferol (VITAMIN D) 2000 UNITS CAPS Take 4,000 Units by mouth daily.   . clotrimazole (CLOTRIMAZOLE ANTI-FUNGAL) 1 % cream Apply 1 application topically 2 (two) times daily.  Marland Kitchen dexlansoprazole (DEXILANT) 60 MG capsule Take 60 mg by mouth daily.  Marland Kitchen escitalopram (LEXAPRO) 10 MG tablet Take 20 mg by mouth.   . furosemide (LASIX) 20 MG tablet Take 1 tablet (20 mg total) by mouth daily.  . isosorbide mononitrate (IMDUR) 60 MG 24 hr tablet Take 1 tablet (60 mg total) by mouth daily.  . nitroGLYCERIN (NITROSTAT) 0.4 MG SL tablet Place 1 tablet (0.4 mg total) under the tongue every 5 (five) minutes as needed.  Marland Kitchen PROAIR HFA 108 (90 Base) MCG/ACT inhaler Inhale 2 puffs into the lungs every 4 (four) hours as needed for wheezing or shortness of breath.  . rivaroxaban (XARELTO) 20 MG TABS tablet Take 20 mg by mouth daily with supper.  Marland Kitchen Spacer/Aero-Holding Chambers (AEROCHAMBER MV) inhaler Use as instructed  . vitamin B-12 (CYANOCOBALAMIN) 500 MCG tablet Take 500 mcg by mouth daily.     Allergies:   Erythromycin, Erythromycin, Gabapentin, Gabapentin, Morphine, Morphine and related, Penicillins, Penicillins, and Relafen [nabumetone]   Social History   Tobacco Use  . Smoking status: Never Smoker  . Smokeless  tobacco: Never Used  Vaping Use  . Vaping Use: Never used  Substance Use Topics  . Alcohol use: Never    Alcohol/week: 0.0 standard drinks  . Drug use: Never     Family Hx: The patient's family history includes Asthma in her daughter and mother; Breast cancer in her maternal aunt; COPD in her mother; Colon cancer in her paternal aunt; Diabetes in her brother, daughter, and mother; Heart attack in her brother and mother; Hypertension in her brother; Kidney failure in her brother; Lung cancer in her father.  ROS:   Please see the history of present illness.     All other systems reviewed and are negative.   Prior CV studies:   The following studies were reviewed today:    Labs/Other Tests and Data Reviewed:    EKG:  No ECG reviewed.  Recent Labs: 08/31/2019: ALT 18 10/10/2019: BUN 11; Creatinine,  Ser 1.14; Hemoglobin 12.8; Platelets 222; Potassium 4.0; Sodium 139   Recent Lipid Panel Lab Results  Component Value Date/Time   CHOL 182 01/16/2018 02:38 AM   TRIG 60 01/16/2018 02:38 AM   HDL 46 01/16/2018 02:38 AM   CHOLHDL 4.0 01/16/2018 02:38 AM   LDLCALC 124 (H) 01/16/2018 02:38 AM    Wt Readings from Last 3 Encounters:  10/18/19 255 lb 6.4 oz (115.8 kg)  09/01/19 254 lb 6.4 oz (115.4 kg)  08/31/19 250 lb (113.4 kg)     Objective:    Vital Signs:  There were no vitals taken for this visit.  She does not have access to a blood pressure cuff at this time.  VITAL SIGNS:  reviewed GEN:  no acute distress RESPIRATORY:  normal respiratory effort, no increased work of breathing NEURO:  alert and oriented x 3, speech normal PSYCH:  normal affect   ASSESSMENT & PLAN:    Stable angina (HCC) Coronary artery disease involving native coronary artery of native heart without angina pectoris - She had a concerning episode of chest pain, but would like to continue medical therapy. I have offered coronary angiography but she defers at this time. She would like to wait until she  has one more episode of chest pain to go for cath.  I have cautioned her on this strategy, but she is very reliable and will let me know if she is having any worsening issues.  I have offered to titrate her Imdur today for chest pain, but she says that the episodes are infrequent enough that this is likely not necessary.  We will continue to closely observe.  Syncope and collapse-results from cardiac monitor have not resulted yet, we will discuss when these are available.  History of pulmonary embolus (PE) History of DVT (deep vein thrombosis) -She continues on rivaroxaban 20 mg daily.  Medication management -As above  She is anticipating eye surgery in the near future.  I am concerned that if she undergoes coronary angiography she will likely require a stent to her LAD which we have been monitoring over time and treating with medical therapy.  I query whether she will be able to have a surgery on her retina while on dual antiplatelet therapy.  This should be carefully considered.   COVID-19 Education: The signs and symptoms of COVID-19 were discussed with the patient and how to seek care for testing (follow up with PCP or arrange E-visit).  The importance of social distancing was discussed today.  Time:   Today, I have spent 20 minutes with the patient with telehealth technology discussing the above problems.     Medication Adjustments/Labs and Tests Ordered: Current medicines are reviewed at length with the patient today.  Concerns regarding medicines are outlined above.   Tests Ordered: No orders of the defined types were placed in this encounter.   Medication Changes: No orders of the defined types were placed in this encounter.   Signed, Elouise Munroe, MD  11/15/2019 8:18 AM    [place]

## 2019-11-15 NOTE — Patient Instructions (Signed)
Medication Instructions:  Your physician recommends that you continue on your current medications as directed. Please refer to the Current Medication list given to you today.  *If you need a refill on your cardiac medications before your next appointment, please call your pharmacy*   Follow-Up: At [facility], you and your health needs are our priority.  As part of our continuing mission to provide you with exceptional heart care, we have created designated Provider Care Teams.  These Care Teams include your primary Cardiologist (physician) and Advanced Practice Providers (APPs -  Physician Assistants and Nurse Practitioners) who all work together to provide you with the care you need, when you need it.  We recommend signing up for the patient portal called "MyChart".  Sign up information is provided on this After Visit Summary.  MyChart is used to connect with patients for Virtual Visits (Telemedicine).  Patients are able to view lab/test results, encounter notes, upcoming appointments, etc.  Non-urgent messages can be sent to your provider as well.   To learn more about what you can do with MyChart, go to [email or web address].    Your next appointment:   12/28/2019 with Dr. Margaretann Loveless

## 2019-11-22 ENCOUNTER — Telehealth: Payer: Self-pay | Admitting: *Deleted

## 2019-11-22 NOTE — Telephone Encounter (Signed)
Pt is scheduled to see Coletta Memos, NP, 11/24/19 at 11:15 for surgical clearance. Will route back to the requesting surgeon's office to make them aware that clearance will discussed a that visit.

## 2019-11-22 NOTE — Telephone Encounter (Signed)
   Primary Cardiologist: Dr Margaretann Loveless  Chart reviewed and patient contacted by phone today as part of pre-operative protocol coverage. Because of Diane Barker's past medical history, time since last visit, and recently noted increased DOE, she will require a follow-up visit in order to better assess preoperative cardiovascular risk.  Pre-op covering staff: - Please schedule appointment with Dr Margaretann Loveless ASAP and call patient to inform them. She has an appointment for Oct 13th but this needs to be moved up.  Please add "pre-op clearance" to the appointment notes so provider is aware. - Please contact requesting surgeon's office via preferred method (i.e, phone, fax) to inform them of need for appointment prior to surgery.  If applicable, this message will also be routed to pharmacy pool and/or primary cardiologist for input on holding anticoagulant/antiplatelet agent as requested below so that this information is available to the clearing provider at time of patient's appointment.   Kerin Ransom, PA-C  11/22/2019, 3:43 PM

## 2019-11-22 NOTE — Telephone Encounter (Signed)
       [facility] Pre-operative Risk Assessment     Request for surgical clearance:  1. What type of surgery is being performed? VITRECTOMY W/MEMBRANE PEELING-LEFT    2. When is this surgery scheduled? 11/29/2019   3. What type of clearance is required (medical clearance vs. Pharmacy clearance to hold med vs. Both)? MEDICAL CLEARANCE  4. Are there any medications that need to be held prior to surgery and how long?    5. Practice name and name of physician performing surgery? [facility] DR Nevin Bloodgood [facility]   6. What is the office phone number? [phone]   6.   What is the office fax number? [phone]  8.   Anesthesia type (None, local, MAC, general) ? IV SEDATION

## 2019-11-23 NOTE — Progress Notes (Signed)
Cardiology Clinic Note   Patient Name: Diane Barker Date of Encounter: 11/23/2019  Primary Care Provider:  Shon Baton, MD Primary Cardiologist:  Elouise Munroe, MD  Patient Profile    Diane Barker. Biernat 69 year old female presents to the clinic today for follow-up evaluation of her chest pain and preoperative cardiac evaluation.  Past Medical History    Past Medical History:  Diagnosis Date  . Anxiety   . Arthritis    "all over my body" (01/15/2018)  . Breast cancer, right breast ([place]) 2000   Rt lumpectomy, Chemo/XRT/rt axillary node   . Bronchial asthma   . Chronic lower back pain   . Depression   . Diverticulosis   . DVT (deep venous thrombosis) ([place]) 06/2013   RLE  . GERD (gastroesophageal reflux disease)   . High cholesterol   . Hx of adenomatous colonic polyps   . Hypertension   . Obesity   . Osteoarthritis   . Personal history of chemotherapy   . Personal history of radiation therapy   . Pulmonary embolism ([place]) 06/2013   "both lungs"  . Spondylolisthesis    Past Surgical History:  Procedure Laterality Date  . BREAST BIOPSY Right 2000  . BREAST LUMPECTOMY Right 2000   for CA txed w/ chemo and radiation, right  . CARDIAC CATHETERIZATION    . HERNIA REPAIR    . INTRAVASCULAR PRESSURE WIRE/FFR STUDY N/A 01/18/2018   Procedure: INTRAVASCULAR PRESSURE WIRE/FFR STUDY;  Surgeon: Burnell Blanks, MD;  Location: [facility];  Service: Cardiovascular;  Laterality: N/A;  . LEFT HEART CATH AND CORONARY ANGIOGRAPHY N/A 01/18/2018   Procedure: LEFT HEART CATH AND CORONARY ANGIOGRAPHY;  Surgeon: Burnell Blanks, MD;  Location: [facility];  Service: Cardiovascular;  Laterality: N/A;  . TUBAL LIGATION    . UMBILICAL HERNIA REPAIR  1983    Allergies  Allergies  Allergen Reactions  . Erythromycin Other (See Comments)    REACTION: stomach cramps  . Erythromycin Other (See Comments)    Stomach cramps  . Gabapentin     Causes blackouts  .  Gabapentin Other (See Comments)    Black out  . Morphine Other (See Comments)    REACTION: Hallucinations  . Morphine And Related Other (See Comments)    hallucinations  . Penicillins Other (See Comments)    REACTION: unkkown, was a child  . Penicillins Other (See Comments)    Reaction unknown Pt was a child  . Relafen [Nabumetone] Nausea Only    History of Present Illness    Ms. Diane Barker has a PMH of anxiety, arthritis, right breast cancer status post right lumpectomy, asthma, depression, DVT right lower extremity, GERD, hyperlipidemia, hypertension, obesity, coronary artery disease, DOE and bilateral PE on Xarelto.  She was last seen by Dr. Margaretann Loveless on 07/06/2019.  During that time she had been having intermittent episodes of chest discomfort that spanned over the course of the month.  She stated she had taken nitroglycerin twice for her discomfort which helped.  (She does take long-acting nitrate).  She also continued with mild DOE which improved with furosemide treatment.  She denied significant palpitations, PND, orthopnea, lower extremity edema and syncope.  She indicated that she was managing increased situational stressors related to her family.  Cardiac catheterization was discussed given her known LAD disease.  She was to defer cardiac catheterization at that time.  Her aspirin, statin, Imdur, and nitroglycerin were continued.  She presents the clinic today for follow-up evaluation  and preoperative cardiac evaluation.  She states she has a "hole" in her left eye and was told she needed surgery. She is not having vision changes. She is relatively poor historian. She said some months ago she fell twice and Dr. Margaretann Loveless recently ordered a heart monitor. Results do not appear to be read. Since her last visit she at first denied chest pain, palpitations, LLE, orthopnea. She has chronic DOE which is unchanged. However after further questioning she said a month ago she took SL nitro x 3. She had  just sat down at a restaurant and had sudden onset back pain between the shoulder blades that radiated to the front and across her chest. Says pain was dull but became severe 8/10. She did not feel it was cardiac in nature. She felt it was residual from her falls (when she fell on her left shoulder). She had no associated symptoms at that time. The person who was with the patient instructed she take her nitroglycerin, which was expired. The patient ended up taking 3 nitro which improved the pain. She subsequently took Tylenol which completely resolved the pain. Since then she denies recurrent chest pain at rest or on exertion.   She denies lower extremity edema, fatigue, palpitations, melena, hematuria, hemoptysis, diaphoresis, weakness, presyncope, further syncope, orthopnea, and PND.   Home Medications    Prior to Admission medications   Medication Sig Start Date End Date Taking? Authorizing Provider  acetaminophen (TYLENOL) 500 MG tablet Take 500 mg by mouth every 6 (six) hours as needed for mild pain.    [provider]  albuterol (VENTOLIN HFA) 108 (90 Base) MCG/ACT inhaler Inhale 1-2 puffs into the lungs every 6 (six) hours as needed for wheezing or shortness of breath.    [provider]  aspirin 81 MG EC tablet Take 1 tablet (81 mg total) by mouth daily. 09/24/18   Elouise Munroe, MD  atorvastatin (LIPITOR) 40 MG tablet Take 40 mg by mouth every evening.    [provider]  budesonide-formoterol (SYMBICORT) 80-4.5 MCG/ACT inhaler Inhale 2 puffs, then rinse mouth, twice daily maintenance 09/01/19   Baird Lyons D, MD  Cholecalciferol (VITAMIN D) 2000 UNITS CAPS Take 4,000 Units by mouth daily.     [provider]  clotrimazole (CLOTRIMAZOLE ANTI-FUNGAL) 1 % cream Apply 1 application topically 2 (two) times daily.    [provider]  dexlansoprazole (DEXILANT) 60 MG capsule Take 60 mg by mouth daily.    [provider]  escitalopram  (LEXAPRO) 10 MG tablet Take 20 mg by mouth.     [provider]  furosemide (LASIX) 20 MG tablet Take 1 tablet (20 mg total) by mouth daily. 12/31/18   Elouise Munroe, MD  isosorbide mononitrate (IMDUR) 60 MG 24 hr tablet Take 1 tablet (60 mg total) by mouth daily. 07/06/19   Elouise Munroe, MD  nitroGLYCERIN (NITROSTAT) 0.4 MG SL tablet Place 1 tablet (0.4 mg total) under the tongue every 5 (five) minutes as needed. 07/06/19   Elouise Munroe, MD  PROAIR HFA 108 [phone] Base) MCG/ACT inhaler Inhale 2 puffs into the lungs every 4 (four) hours as needed for wheezing or shortness of breath. 09/01/19   Deneise Lever, MD  rivaroxaban (XARELTO) 20 MG TABS tablet Take 20 mg by mouth daily with supper.    [provider]  Spacer/Aero-Holding Chambers (AEROCHAMBER MV) inhaler Use as instructed 07/30/18   Deneise Lever, MD  vitamin B-12 (CYANOCOBALAMIN) 500 MCG tablet  Take 500 mcg by mouth daily.    [provider]    Family History    Family History  Problem Relation Age of Onset  . Asthma Mother   . COPD Mother   . Heart attack Mother   . Diabetes Mother   . Lung cancer Father   . Asthma Daughter   . Kidney failure Brother   . Diabetes Brother        x 2  . Heart attack Brother   . Breast cancer Maternal Aunt   . Colon cancer Paternal Aunt   . Diabetes Daughter        x 2  . Hypertension Brother    She indicated that her mother is deceased. She indicated that her father is deceased. She indicated that four of her five brothers are alive. She indicated that her maternal grandmother is deceased. She indicated that her maternal grandfather is deceased. She indicated that her paternal grandmother is deceased. She indicated that her paternal grandfather is deceased. She indicated that her maternal aunt is deceased. She indicated that her paternal aunt is deceased.  Social History    Social History   Socioeconomic History  . Marital status: Single     Spouse name: Not on file  . Number of children: 2  . Years of education: 10  . Highest education level: Not on file  Occupational History  . 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 Smoking status: Never Smoker  . Smokeless tobacco: Never Used  Vaping Use  . Vaping Use: Never used  Substance and Sexual Activity  . Alcohol use: Never    Alcohol/week: 0.0 standard drinks  . Drug use: Never  . Sexual activity: Not Currently  Other Topics Concern  . Not on file  Social History Narrative   ** Merged History Encounter **       Lives at home alone Caffeine use- soda 16-24 oz weekly   Social Determinants of Health   Financial Resource Strain:   . Difficulty of Paying Living Expenses: Not on file  Food Insecurity:   . Worried About Charity fundraiser in the Last Year: Not on file  . Ran Out of Food in the Last Year: Not on file  Transportation Needs:   . Lack of Transportation (Medical): Not on file  . Lack of Transportation (Non-Medical): Not on file  Physical Activity:   . Days of Exercise per Week: Not on file  . Minutes of Exercise per Session: Not on file  Stress:   . Feeling of Stress : Not on file  Social Connections:   . Frequency of Communication with Friends and Family: Not on file  . Frequency of Social Gatherings with Friends and Family: Not on file  . Attends Religious Services: Not on file  . Active Member of Clubs or Organizations: Not on file  . Attends Archivist Meetings: Not on file  . Marital Status: Not on file  Intimate Partner Violence:   . Fear of Current or Ex-Partner: Not on file  . Emotionally Abused: Not on file  . Physically Abused: Not on file  . Sexually Abused: Not on file     Review of Systems    General:  No chills, fever, night sweats or weight changes.  Cardiovascular:  No chest pain, dyspnea on exertion, edema, orthopnea, palpitations, paroxysmal nocturnal  dyspnea. Dermatological: No rash, lesions/masses Respiratory: No  cough, dyspnea Urologic: No hematuria, dysuria Abdominal:   No nausea, vomiting, diarrhea, bright red blood per rectum, melena, or hematemesis Neurologic:  No visual changes, wkns, changes in mental status. All other systems reviewed and are otherwise negative except as noted above.  Physical Exam    VS:  There were no vitals taken for this visit. , BMI There is no height or weight on file to calculate BMI. GEN: Well nourished, well developed, in no acute distress. HEENT: normal. Neck: Supple, no JVD, carotid bruits, or masses. Cardiac: RRR, + murmurs, no rubs, or gallops. No clubbing, cyanosis, edema.  Radials/DP/PT 2+ and equal bilaterally.  Respiratory:  Respirations regular and unlabored, clear to auscultation bilaterally. GI: Soft, nontender, nondistended, BS + x 4. MS: no deformity or atrophy. Skin: warm and dry, no rash. Neuro:  Strength and sensation are intact. Psych: Normal affect.  Accessory Clinical Findings    Recent Labs: 08/31/2019: ALT 18 10/10/2019: BUN 11; Creatinine, Ser 1.14; Hemoglobin 12.8; Platelets 222; Potassium 4.0; Sodium 139   Recent Lipid Panel    Component Value Date/Time   CHOL 182 01/16/2018 0238   TRIG 60 01/16/2018 0238   HDL 46 01/16/2018 0238   CHOLHDL 4.0 01/16/2018 0238   VLDL 12 01/16/2018 0238   LDLCALC 124 (H) 01/16/2018 0238    ECG personally reviewed by me today-  NSR, 61bpm, nonspecific ST/T wave changes. No acute changes  Echocardiogram 01/16/2018 Study Conclusions   - Procedure narrative: Transthoracic echocardiography. Image  quality was poor. The study was technically difficult, as a  result of body habitus. Intravenous contrast (Definity) was  administered.  - Left ventricle: The cavity size was normal. Wall thickness was  increased in a pattern of moderate LVH. Systolic function was  normal. The estimated ejection fraction was in the range of 55%   to 60%. Wall motion was normal; there were no regional wall  motion abnormalities. Doppler parameters are consistent with  abnormal left ventricular relaxation (grade 1 diastolic  dysfunction). The E/e&' ratio is >15, suggesting elevated LV  filling pressure.  - Mitral valve: Calcified annulus. Mildly thickened leaflets .  There was trivial regurgitation.  - Left atrium: The atrium was normal in size.  - Inferior vena cava: The vessel was normal in size. The  respirophasic diameter changes were in the normal range (>= 50%),  consistent with normal central venous pressure.  Cardiac catheterization 01/18/2018  Ost RCA to Prox RCA lesion is 20% stenosed.  Ost RPDA to RPDA lesion is 20% stenosed.  Mid RCA lesion is 20% stenosed.  Prox Cx to Mid Cx lesion is 30% stenosed.  Prox LAD lesion is 60% stenosed.  Ost LAD to Prox LAD lesion is 10% stenosed.  Ost 1st Diag lesion is 30% stenosed.  Mid LAD lesion is 50% stenosed.  Ost 2nd Diag lesion is 50% stenosed.   1. Moderate, eccentric, calcified stenosis in the mid LAD at the takeoff of the first diagonal branch and moderate eccentric stenosis in the mid to distal LAD at the takeoff of the second diagonal branch. DFR and FFR analysis of the mid and distal LAD suggests the lesions are not flow limiting. In multiple angiographic views, the lesions appear to be moderate only.  2. Mild non-obstructive disease in the RCA and Circumflex  Recommendation: I would recommend a trial of medical therapy at this time given the moderate disease in her LAD. I will add a beta blocker and Imdur. If she continues to have angina despite optimal  medical therapy, consideration could be given to PCI of the mid LAD. Resume DOAC tomorrow.   Diagnostic Dominance: Right  Intervention    Assessment & Plan   Coronary artery disease Cardiac catheterization 11/19 showed mid and distal LAD stenosis 60 and 50%.  Medical management  recommended. She last saw Dr. Margaretann Loveless in April 2021 and reported mild chest discomfort with sob. Cath was discuss but patient declined. One month ago she had an episode of back/chest pain improved with NTG and Tylenol. She felt it was MSK, residual from the fall, although difficult to be sure. She has chronic sob on exertion, this has not worsened. She has been taking all medications daily. Given vague symptoms will discuss with Dr. Margaretann Loveless need for catheterization prior to procedure.  Continue aspirin, atorvastatin, Imdur, nitroglycerin Heart healthy low-sodium diet-salty 6 given Increase physical activity as tolerated  Hyperlipidemia-LDL 124 on 01/16/2018. Continue atorvastatin Heart healthy low-sodium high-fiber diet.   Increase physical activity as tolerated  History of DVT/PE-no increased DOE or chest discomfort today.  No bleeding issues. Continue Xarelto. Due to history of B/L DVTs Xarelto can be held for 1 day prior to surgery.  Syncope Heart monitor overall was unremarkable. Showed stable 12 second SVT and otherwise NSR, no afib. Patient denies further episodes.   Pre-operative Cardiac Evaluation Patient is not very active at baseline but denies any exertional symptoms. She has chronic sob. Did have back/chest pain episode x1 as described above. No LLE, orthopnea, palpitations. No further syncope. Heart monitor unremarkable. EKG today unremarkable. Last echo 01/2018 LVEF 55-60%, G1DD.  - METS >4 (4.7).  - According to Revised Cardiac Risk Index patient is class II risk with 6% for 30-day risk of death, MI, or cardiac arrest.  - Hold Xarelto 1 day prior to procedure -vitrectomy with membrane peeling, [facility], Dr. Dennie Fetters fax [phone] - Given episode of chest/back pain improved with NTG and known CAD history with cath previously recommended, I will defer cath to Dr. Margaretann Loveless. Patient says she is willing to undergo catheterization.    Primary Cardiologist: Elouise Munroe, MD  Chart reviewed as part of pre-operative protocol coverage. Given past medical history and time since last visit, based on ACC/AHA guidelines, ALIA PARSLEY would be at acceptable risk for the planned procedure without further cardiovascular testing.   I will route this recommendation to the requesting party via Epic fax function and remove from pre-op pool.  Please call with questions.  Disposition: Follow-up with Dr.Acharya in 3 months.  Damarkus Balis Jorene Minors    11/23/2019, 7:10 PM [place] [address] Office [phone] Fax [phone]  Notice: This dictation was prepared with Dragon dictation along with smaller phrase technology. Any transcriptional errors that result from this process are unintentional and may not be corrected upon review.

## 2019-11-24 ENCOUNTER — Encounter: Payer: Self-pay | Admitting: Medical

## 2019-11-24 ENCOUNTER — Ambulatory Visit (INDEPENDENT_AMBULATORY_CARE_PROVIDER_SITE_OTHER): Payer: 59 | Admitting: Medical

## 2019-11-24 ENCOUNTER — Other Ambulatory Visit: Payer: Self-pay

## 2019-11-24 VITALS — BP 136/74 | HR 61 | Temp 97.9°F | Ht 66.0 in | Wt 252.8 lb

## 2019-11-24 DIAGNOSIS — I10 Essential (primary) hypertension: Secondary | ICD-10-CM

## 2019-11-24 DIAGNOSIS — Z86718 Personal history of other venous thrombosis and embolism: Secondary | ICD-10-CM

## 2019-11-24 DIAGNOSIS — I2510 Atherosclerotic heart disease of native coronary artery without angina pectoris: Secondary | ICD-10-CM

## 2019-11-24 DIAGNOSIS — R55 Syncope and collapse: Secondary | ICD-10-CM

## 2019-11-24 DIAGNOSIS — E785 Hyperlipidemia, unspecified: Secondary | ICD-10-CM

## 2019-11-24 DIAGNOSIS — Z01810 Encounter for preprocedural cardiovascular examination: Secondary | ICD-10-CM | POA: Diagnosis not present

## 2019-11-24 NOTE — Patient Instructions (Signed)
Medication Instructions:  Continue current medications  *If you need a refill on your cardiac medications before your next appointment, please call your pharmacy*   Lab Work: None Ordered  Testing/Procedures: None Ordered   Follow-Up: At [facility], you and your health needs are our priority.  As part of our continuing mission to provide you with exceptional heart care, we have created designated Provider Care Teams.  These Care Teams include your primary Cardiologist (physician) and Advanced Practice Providers (APPs -  Physician Assistants and Nurse Practitioners) who all work together to provide you with the care you need, when you need it.  We recommend signing up for the patient portal called "MyChart".  Sign up information is provided on this After Visit Summary.  MyChart is used to connect with patients for Virtual Visits (Telemedicine).  Patients are able to view lab/test results, encounter notes, upcoming appointments, etc.  Non-urgent messages can be sent to your provider as well.   To learn more about what you can do with MyChart, go to [email or web address].    Your next appointment:   3 month(s)  The format for your next appointment:   In Person  Provider:   You may see Elouise Munroe, MD or one of the following Advanced Practice Providers on your designated Care Team:    Rosaria Ferries, PA-C  Jory Sims, DNP, ANP  Cadence Kathlen Mody, PA-C

## 2019-11-28 ENCOUNTER — Telehealth: Payer: Self-pay | Admitting: Internal Medicine

## 2019-11-28 ENCOUNTER — Telehealth: Payer: Self-pay

## 2019-11-28 NOTE — Telephone Encounter (Signed)
Called Ms. Diane today to discuss her upcoming surgery for a macular hole.  I also spoke to Dr. Stacie Glaze by phone around 1 PM to discuss surgical details and patient specific factors.  Patient will be having a procedure to fix a macular hole and will have MAC anesthesia with a retrobulbar block.  This can be performed on antiplatelet or anticoagulation therapy.  We discussed the patient's cardiovascular symptoms which have been infrequent episodes of chest pain.  Her last episode was mid August.  The patient feels it was musculoskeletal chest pain because it responded best to Tylenol and did not resolve with nitroglycerin.  She does have a known moderate to severe LAD lesion with infrequent episodes of stable angina.  She is on good medical therapy and takes a long-acting nitrate.  After discussing the patient's surgical procedure with Dr. Stacie Glaze, I called [name].  We discussed that her planned anesthesia should be low risk overall from a cardiovascular standpoint.  However, if she has worsening episodes of chest pain in the interim she should let us both know right away.  The patient and I have participated in shared decision-making and determined that coronary angiography for reevaluation of her LAD lesion can occur in the next several weeks to months but does not need to precede her planned eye surgery.  Patient is in agreement with this plan.  She is on both aspirin and Xarelto.  For stable coronary artery disease, only one is necessary, I have asked the patient to stop taking aspirin.  The patient is intermediate risk for low risk procedure.  No further cardiovascular testing is required prior to the procedure.  If this level of risk is acceptable to the patient and surgical team, the patient should be considered optimized from a cardiovascular standpoint.

## 2019-11-28 NOTE — Telephone Encounter (Signed)
Called patient to give monitor results, patient verbalized understanding. Patient stated she wanted to know the status on her eye surgery approval and if she is going to require a heart cath prior to having the eye surgery performed. Advised patient I would forward this message to Dr. Margaretann Loveless for review and advice.

## 2019-12-28 ENCOUNTER — Ambulatory Visit (INDEPENDENT_AMBULATORY_CARE_PROVIDER_SITE_OTHER): Payer: 59 | Admitting: Internal Medicine

## 2019-12-28 ENCOUNTER — Other Ambulatory Visit: Payer: Self-pay

## 2019-12-28 ENCOUNTER — Encounter: Payer: Self-pay | Admitting: Internal Medicine

## 2019-12-28 VITALS — BP 126/72 | HR 63 | Ht 65.0 in | Wt 254.4 lb

## 2019-12-28 DIAGNOSIS — Z79899 Other long term (current) drug therapy: Secondary | ICD-10-CM

## 2019-12-28 DIAGNOSIS — R55 Syncope and collapse: Secondary | ICD-10-CM

## 2019-12-28 DIAGNOSIS — I2510 Atherosclerotic heart disease of native coronary artery without angina pectoris: Secondary | ICD-10-CM

## 2019-12-28 DIAGNOSIS — I10 Essential (primary) hypertension: Secondary | ICD-10-CM | POA: Diagnosis not present

## 2019-12-28 DIAGNOSIS — E785 Hyperlipidemia, unspecified: Secondary | ICD-10-CM | POA: Diagnosis not present

## 2019-12-28 DIAGNOSIS — Z86718 Personal history of other venous thrombosis and embolism: Secondary | ICD-10-CM

## 2019-12-28 NOTE — Patient Instructions (Addendum)
Medication Instructions:  No Changes In Medications at this time.  *If you need a refill on your cardiac medications before your next appointment, please call your pharmacy*  Lab Work: None Ordered At This Time.  If you have labs (blood work) drawn today and your tests are completely normal, you will receive your results only by: . MyChart Message (if you have MyChart) OR . A paper copy in the mail If you have any lab test that is abnormal or we need to change your treatment, we will call you to review the results.  Testing/Procedures: None Ordered At This Time.   Follow-Up: At [facility], you and your health needs are our priority.  As part of our continuing mission to provide you with exceptional heart care, we have created designated Provider Care Teams.  These Care Teams include your primary Cardiologist (physician) and Advanced Practice Providers (APPs -  Physician Assistants and Nurse Practitioners) who all work together to provide you with the care you need, when you need it.  We recommend signing up for the patient portal called "MyChart".  Sign up information is provided on this After Visit Summary.  MyChart is used to connect with patients for Virtual Visits (Telemedicine).  Patients are able to view lab/test results, encounter notes, upcoming appointments, etc.  Non-urgent messages can be sent to your provider as well.   To learn more about what you can do with MyChart, go to [email or web address].    Your next appointment:   6 month(s)  The format for your next appointment:   In Person  Provider:   Gayatri Acharya, MD  

## 2019-12-28 NOTE — Progress Notes (Signed)
Cardiology Office Note:    Date:  12/28/2019   ID:  Diane Barker, DOB [birth year], MRN [id number]  PCP:  Diane Baton, MD  Cardiologist:  Diane Munroe, MD  Electrophysiologist:  None   Referring MD: Diane Baton, MD   Chief Complaint/Reason for Referral: Follow up  History of Present Illness:    Diane Barker is a 69 y.o. female with a history of coronary artery disease with stable angina who presents for follow-up of chest pain.  Her daughter is with her today. She has been caring for her daughter after her daughter surgery for toe amputation in [place]. Diane Barker has overall been well. She denies any recurrence of chest pain and feels it was musculoskeletal when she had an episode of chest pain on November 09, 2019. We reviewed again indications for coronary angiography with symptomatic CAD. The patient defers at this time, but knows to contact me immediately if she has worrisome chest pain. In addition I have strongly urged her to present to the ER with any sustained chest discomfort. She continues on Imdur 60 mg daily for antianginal therapy and is intolerant to beta-blockers due to mood symptoms. She continues on Xarelto for history of PE. Denies chest pain, shortness of breath, palpitations, PND, orthopnea, leg swelling today. Denies any recurrent episodes of syncope. We reviewed the results of her monitor performed in the setting of unexplained syncope x2. No malignant arrhythmias. She did have a 12-second episode of SVT which was asymptomatic on her review. Symptoms of tiredness and fatigue, shortness of breath, chest pain and pressure were all associated with sinus rhythm and sinus tachycardia without definite ST deviation.  Past Medical History:  Diagnosis Date  . Anxiety   . Arthritis    "all over my body" (01/15/2018)  . Breast cancer, right breast ([place]) 2000   Rt lumpectomy, Chemo/XRT/rt axillary node   . Bronchial asthma   . Chronic lower back pain   . Depression   .  Diverticulosis   . DVT (deep venous thrombosis) ([place]) 06/2013   RLE  . GERD (gastroesophageal reflux disease)   . High cholesterol   . Hx of adenomatous colonic polyps   . Hypertension   . Obesity   . Osteoarthritis   . Personal history of chemotherapy   . Personal history of radiation therapy   . Pulmonary embolism ([place]) 06/2013   "both lungs"  . Spondylolisthesis     Past Surgical History:  Procedure Laterality Date  . BREAST BIOPSY Right 2000  . BREAST LUMPECTOMY Right 2000   for CA txed w/ chemo and radiation, right  . CARDIAC CATHETERIZATION    . HERNIA REPAIR    . INTRAVASCULAR PRESSURE WIRE/FFR STUDY N/A 01/18/2018   Procedure: INTRAVASCULAR PRESSURE WIRE/FFR STUDY;  Surgeon: Diane Blanks, MD;  Location: [facility];  Service: Cardiovascular;  Laterality: N/A;  . LEFT HEART CATH AND CORONARY ANGIOGRAPHY N/A 01/18/2018   Procedure: LEFT HEART CATH AND CORONARY ANGIOGRAPHY;  Surgeon: Diane Blanks, MD;  Location: [facility];  Service: Cardiovascular;  Laterality: N/A;  . TUBAL LIGATION    . UMBILICAL HERNIA REPAIR  1983    Current Medications: Current Meds  Medication Sig  . acetaminophen (TYLENOL) 500 MG tablet Take 500 mg by mouth every 6 (six) hours as needed for mild pain.  [place] Kitchen albuterol (VENTOLIN HFA) 108 (90 Base) MCG/ACT inhaler Inhale 1-2 puffs into the lungs every 6 (six) hours as needed for wheezing or shortness  of breath.  Marland Kitchen atorvastatin (LIPITOR) 40 MG tablet Take 40 mg by mouth every evening.  . budesonide-formoterol (SYMBICORT) 80-4.5 MCG/ACT inhaler Inhale 2 puffs, then rinse mouth, twice daily maintenance  . Cholecalciferol (VITAMIN D) 2000 UNITS CAPS Take 4,000 Units by mouth daily.   . clotrimazole (CLOTRIMAZOLE ANTI-FUNGAL) 1 % cream Apply 1 application topically 2 (two) times daily.  Marland Kitchen dexlansoprazole (DEXILANT) 60 MG capsule Take 60 mg by mouth daily.  Marland Kitchen escitalopram (LEXAPRO) 10 MG tablet Take 20 mg by mouth.   .  furosemide (LASIX) 20 MG tablet Take 1 tablet (20 mg total) by mouth daily.  . isosorbide mononitrate (IMDUR) 60 MG 24 hr tablet Take 1 tablet (60 mg total) by mouth daily.  . nitroGLYCERIN (NITROSTAT) 0.4 MG SL tablet Place 1 tablet (0.4 mg total) under the tongue every 5 (five) minutes as needed.  Marland Kitchen ofloxacin (OCUFLOX) 0.3 % ophthalmic solution Place 1 drop into the left eye 4 (four) times daily.  . prednisoLONE acetate (PRED FORTE) 1 % ophthalmic suspension Place into the left eye.  Marland Kitchen PROAIR HFA 108 (90 Base) MCG/ACT inhaler Inhale 2 puffs into the lungs every 4 (four) hours as needed for wheezing or shortness of breath.  . rivaroxaban (XARELTO) 20 MG TABS tablet Take 20 mg by mouth daily with supper.  Marland Kitchen Spacer/Aero-Holding Chambers (AEROCHAMBER MV) inhaler Use as instructed  . vitamin B-12 (CYANOCOBALAMIN) 500 MCG tablet Take 500 mcg by mouth daily.  . [DISCONTINUED] aspirin 81 MG EC tablet Take 1 tablet (81 mg total) by mouth daily.     Allergies:   Erythromycin, Erythromycin, Gabapentin, Gabapentin, Morphine, Morphine and related, Penicillins, Penicillins, and Relafen [nabumetone]   Social History   Tobacco Use  . Smoking status: Never Smoker  . Smokeless tobacco: Never Used  Vaping Use  . Vaping Use: Never used  Substance Use Topics  . Alcohol use: Never    Alcohol/week: 0.0 standard drinks  . Drug use: Never     Family History: The patient's family history includes Asthma in her daughter and mother; Breast cancer in her maternal aunt; COPD in her mother; Colon cancer in her paternal aunt; Diabetes in her brother, daughter, and mother; Heart attack in her brother and mother; Hypertension in her brother; Kidney failure in her brother; Lung cancer in her father.  ROS:   Please see the history of present illness.    All other systems reviewed and are negative.  EKGs/Labs/Other Studies Reviewed:    The following studies were reviewed today:  EKG:  NSR, possible LVH  Recent  Labs: 08/31/2019: ALT 18 10/10/2019: BUN 11; Creatinine, Ser 1.14; Hemoglobin 12.8; Platelets 222; Potassium 4.0; Sodium 139  Recent Lipid Panel    Component Value Date/Time   CHOL 182 01/16/2018 0238   TRIG 60 01/16/2018 0238   HDL 46 01/16/2018 0238   CHOLHDL 4.0 01/16/2018 0238   VLDL 12 01/16/2018 0238   LDLCALC 124 (H) 01/16/2018 0238    Physical Exam:    VS:  BP 126/72   Pulse 63   Ht 5\' 5"  (1.651 m)   Wt 254 lb 6.4 oz (115.4 kg)   SpO2 99%   BMI 42.33 kg/m     Wt Readings from Last 5 Encounters:  12/28/19 254 lb 6.4 oz (115.4 kg)  11/24/19 252 lb 12.8 oz (114.7 kg)  10/18/19 255 lb 6.4 oz (115.8 kg)  09/01/19 254 lb 6.4 oz (115.4 kg)  08/31/19 250 lb (113.4 kg)    Constitutional: No acute  distress Eyes: sclera non-icteric, normal conjunctiva and lids ENMT: normal dentition, moist mucous membranes Cardiovascular: regular rhythm, normal rate, no murmurs. S1 and S2 normal. Radial pulses normal bilaterally. No jugular venous distention.  Respiratory: clear to auscultation bilaterally GI : normal bowel sounds, soft and nontender. No distention.   MSK: extremities warm, well perfused. No edema.  NEURO: grossly nonfocal exam, moves all extremities. PSYCH: alert and oriented x 3, normal mood and affect.   ASSESSMENT:    1. Coronary artery disease involving native coronary artery of native heart without angina pectoris   2. Syncope and collapse   3. Essential hypertension   4. Hyperlipidemia, unspecified hyperlipidemia type   5. History of DVT (deep vein thrombosis)   6. Medication management    PLAN:    Coronary artery disease involving native coronary artery of native heart without angina pectoris - Plan: EKG 12-Lead -Continue medical therapy with Imdur, Xarelto. Intolerant to beta-blockers due to worsening mood symptoms. -Continue atorvastatin 40 mg daily -Low threshold for coronary angiography and PCI to the LAD if chest pain continues.  Syncope and  collapse -No recurrence. No malignant arrhythmia on monitor.  Essential hypertension -BP is stable, continue Lasix 20 mg daily, Imdur 60 mg daily.  Hyperlipidemia, unspecified hyperlipidemia type -Continue atorvastatin 40 mg daily for CAD. LDL from 1 year ago was 53 and is at goal.  History of DVT (deep vein thrombosis)/PE -Continue Xarelto   Total time of encounter: 30 minutes total time of encounter, including 20 minutes spent in face-to-face patient care on the date of this encounter. This time includes coordination of care and counseling regarding above mentioned problem list. Remainder of non-face-to-face time involved reviewing chart documents/testing relevant to the patient encounter and documentation in the medical record. I have independently reviewed documentation from referring provider.   Cherlynn Kaiser, MD [place]  [facility]    Medication Adjustments/Labs and Tests Ordered: Current medicines are reviewed at length with the patient today.  Concerns regarding medicines are outlined above.   Orders Placed This Encounter  Procedures  . EKG 12-Lead    No orders of the defined types were placed in this encounter.   Patient Instructions  Medication Instructions:  No Changes In Medications at this time.  *If you need a refill on your cardiac medications before your next appointment, please call your pharmacy*  Lab Work: None Ordered At This Time.  If you have labs (blood work) drawn today and your tests are completely normal, you will receive your results only by: Marland Kitchen MyChart Message (if you have MyChart) OR . A paper copy in the mail If you have any lab test that is abnormal or we need to change your treatment, we will call you to review the results.  Testing/Procedures: None Ordered At This Time.   Follow-Up: At [facility], you and your health needs are our priority.  As part of our continuing mission to provide you with exceptional heart care, we  have created designated Provider Care Teams.  These Care Teams include your primary Cardiologist (physician) and Advanced Practice Providers (APPs -  Physician Assistants and Nurse Practitioners) who all work together to provide you with the care you need, when you need it.  We recommend signing up for the patient portal called "MyChart".  Sign up information is provided on this After Visit Summary.  MyChart is used to connect with patients for Virtual Visits (Telemedicine).  Patients are able to view lab/test results, encounter notes, upcoming appointments, etc.  Non-urgent messages can be sent to your provider as well.   To learn more about what you can do with MyChart, go to [email or web address].    Your next appointment:   6 month(s)  The format for your next appointment:   In Person  Provider:   Cherlynn Kaiser, MD

## 2020-01-01 ENCOUNTER — Other Ambulatory Visit: Payer: Self-pay | Admitting: Internal Medicine

## 2020-01-28 ENCOUNTER — Other Ambulatory Visit: Payer: Self-pay | Admitting: Internal Medicine

## 2020-01-28 DIAGNOSIS — I2510 Atherosclerotic heart disease of native coronary artery without angina pectoris: Secondary | ICD-10-CM

## 2020-01-28 DIAGNOSIS — I208 Other forms of angina pectoris: Secondary | ICD-10-CM

## 2020-02-02 ENCOUNTER — Other Ambulatory Visit: Payer: Self-pay | Admitting: Internal Medicine

## 2020-02-02 NOTE — Telephone Encounter (Signed)
*  STAT* If patient is at the pharmacy, call can be transferred to refill team.   1. Which medications need to be refilled? (please list name of each medication and dose if known) atorvastatin (LIPITOR) 40 MG tablet  2. Which pharmacy/location (including street and city if local pharmacy) is medication to be sent to? [id number] - [place], [place] - [address]  3. Do they need a 30 day or 90 day supply? 90 day supply 

## 2020-02-24 ENCOUNTER — Ambulatory Visit: Payer: 59 | Admitting: Adult Health

## 2020-03-19 ENCOUNTER — Telehealth: Payer: Self-pay | Admitting: Internal Medicine

## 2020-03-19 NOTE — Telephone Encounter (Signed)
°*  STAT* If patient is at the pharmacy, call can be transferred to refill team.   1. Which medications need to be refilled? (please list name of each medication and dose if known)  atorvastatin (LIPITOR) 40 MG tablet  2. Which pharmacy/location (including street and city if local pharmacy) is medication to be sent to? [id number] - [place], [place] - [address]  3. Do they need a 30 day or 90 day supply?  90 supply   Patient states they have been trying to get this filled since before christmas

## 2020-03-20 ENCOUNTER — Other Ambulatory Visit: Payer: Self-pay

## 2020-03-20 ENCOUNTER — Observation Stay (HOSPITAL_COMMUNITY)
Admission: EM | Admit: 2020-03-20 | Discharge: 2020-03-22 | Disposition: A | Discharge status: Home / Self Care | Payer: 59 | Attending: Cardiovascular Disease | Admitting: Cardiovascular Disease

## 2020-03-20 DIAGNOSIS — I10 Essential (primary) hypertension: Secondary | ICD-10-CM | POA: Diagnosis not present

## 2020-03-20 DIAGNOSIS — I25110 Atherosclerotic heart disease of native coronary artery with unstable angina pectoris: Principal | ICD-10-CM | POA: Insufficient documentation

## 2020-03-20 DIAGNOSIS — Z8679 Personal history of other diseases of the circulatory system: Secondary | ICD-10-CM

## 2020-03-20 DIAGNOSIS — J45909 Unspecified asthma, uncomplicated: Secondary | ICD-10-CM | POA: Diagnosis not present

## 2020-03-20 DIAGNOSIS — I200 Unstable angina: Secondary | ICD-10-CM | POA: Diagnosis present

## 2020-03-20 DIAGNOSIS — Z7901 Long term (current) use of anticoagulants: Secondary | ICD-10-CM | POA: Diagnosis not present

## 2020-03-20 DIAGNOSIS — Z79899 Other long term (current) drug therapy: Secondary | ICD-10-CM | POA: Diagnosis not present

## 2020-03-20 DIAGNOSIS — Z7951 Long term (current) use of inhaled steroids: Secondary | ICD-10-CM | POA: Diagnosis not present

## 2020-03-20 DIAGNOSIS — R079 Chest pain, unspecified: Secondary | ICD-10-CM

## 2020-03-20 DIAGNOSIS — Z20822 Contact with and (suspected) exposure to covid-19: Secondary | ICD-10-CM | POA: Diagnosis not present

## 2020-03-20 LAB — CBC
HCT: 43.9 % (ref 36.0–46.0)
Hemoglobin: 15.1 g/dL — ABNORMAL HIGH (ref 12.0–15.0)
MCH: 34.1 pg — ABNORMAL HIGH (ref 26.0–34.0)
MCHC: 34.4 g/dL (ref 30.0–36.0)
MCV: 99.1 fL (ref 80.0–100.0)
Platelets: 240 10*3/uL (ref 150–400)
RBC: 4.43 MIL/uL (ref 3.87–5.11)
RDW: 12.6 % (ref 11.5–15.5)
WBC: 7.7 10*3/uL (ref 4.0–10.5)
nRBC: 0 % (ref 0.0–0.2)

## 2020-03-20 LAB — BASIC METABOLIC PANEL
Anion gap: 10 (ref 5–15)
BUN: 14 mg/dL (ref 8–23)
CO2: 23 mmol/L (ref 22–32)
Calcium: 9.2 mg/dL (ref 8.9–10.3)
Chloride: 103 mmol/L (ref 98–111)
Creatinine, Ser: 1.09 mg/dL — ABNORMAL HIGH (ref 0.44–1.00)
GFR, Estimated: 55 mL/min — ABNORMAL LOW (ref >60)
Glucose, Bld: 131 mg/dL — ABNORMAL HIGH (ref 70–99)
Potassium: 3.8 mmol/L (ref 3.5–5.1)
Sodium: 136 mmol/L (ref 135–145)

## 2020-03-20 LAB — TROPONIN I (HIGH SENSITIVITY)
Troponin I (High Sensitivity): <2 ng/L (ref <18)
Troponin I (High Sensitivity): 4 ng/L (ref <18)

## 2020-03-20 NOTE — ED Triage Notes (Signed)
Stated chest pain while having a bowel movement, and was resolved prior to EMS arrival. Stated would like to get checked out for it.

## 2020-03-20 NOTE — ED Notes (Signed)
Patients daughter called and wanted to add that patient has a heart doctor, who's name she can't remember, that states that the patient may need a heart cath. She states that the patient does have some blockage but at the time of the visit it was not bad enough to need surgery.

## 2020-03-21 ENCOUNTER — Encounter (HOSPITAL_COMMUNITY): Payer: Self-pay | Admitting: Cardiovascular Disease

## 2020-03-21 ENCOUNTER — Encounter (HOSPITAL_COMMUNITY)
Admission: EM | Disposition: A | Discharge status: Home / Self Care | Payer: Self-pay | Source: Home / Self Care | Attending: Emergency Medicine

## 2020-03-21 DIAGNOSIS — I200 Unstable angina: Secondary | ICD-10-CM | POA: Diagnosis not present

## 2020-03-21 DIAGNOSIS — I25110 Atherosclerotic heart disease of native coronary artery with unstable angina pectoris: Secondary | ICD-10-CM | POA: Diagnosis not present

## 2020-03-21 HISTORY — PX: CARDIAC CATHETERIZATION: SHX172

## 2020-03-21 HISTORY — PX: LEFT HEART CATH AND CORONARY ANGIOGRAPHY: CATH118249

## 2020-03-21 LAB — CBC
HCT: 41.3 % (ref 36.0–46.0)
Hemoglobin: 14.3 g/dL (ref 12.0–15.0)
MCH: 33.6 pg (ref 26.0–34.0)
MCHC: 34.6 g/dL (ref 30.0–36.0)
MCV: 97.2 fL (ref 80.0–100.0)
Platelets: 210 10*3/uL (ref 150–400)
RBC: 4.25 MIL/uL (ref 3.87–5.11)
RDW: 12.9 % (ref 11.5–15.5)
WBC: 6.9 10*3/uL (ref 4.0–10.5)
nRBC: 0 % (ref 0.0–0.2)

## 2020-03-21 LAB — RESP PANEL BY RT-PCR (FLU A&B, COVID) ARPGX2
Influenza A by PCR: NEGATIVE
Influenza B by PCR: NEGATIVE
SARS Coronavirus 2 by RT PCR: NEGATIVE

## 2020-03-21 LAB — HIV ANTIBODY (ROUTINE TESTING W REFLEX): HIV Screen 4th Generation wRfx: NONREACTIVE

## 2020-03-21 LAB — CREATININE, SERUM
Creatinine, Ser: 1.01 mg/dL — ABNORMAL HIGH (ref 0.44–1.00)
GFR, Estimated: 60 mL/min — ABNORMAL LOW (ref >60)

## 2020-03-21 SURGERY — LEFT HEART CATH AND CORONARY ANGIOGRAPHY
Anesthesia: LOCAL

## 2020-03-21 MED ORDER — VITAMIN B-12 1000 MCG PO TABS
500.0000 ug | ORAL_TABLET | Freq: Every day | ORAL | Status: DC
  Administered 2020-03-22: 500 ug via ORAL
  Filled 2020-03-21: qty 1

## 2020-03-21 MED ORDER — SODIUM CHLORIDE 0.9 % WEIGHT BASED INFUSION: 3.0000 mL/kg/h | INTRAVENOUS | Status: DC

## 2020-03-21 MED ORDER — SODIUM CHLORIDE 0.9% FLUSH: 3.0000 mL | INTRAVENOUS | Status: DC | PRN

## 2020-03-21 MED ORDER — OFLOXACIN 0.3 % OP SOLN
1.0000 [drp] | Freq: Four times a day (QID) | OPHTHALMIC | Status: DC
  Filled 2020-03-21: qty 5

## 2020-03-21 MED ORDER — FUROSEMIDE 20 MG PO TABS: 20.0000 mg | ORAL_TABLET | Freq: Every day | ORAL | Status: DC

## 2020-03-21 MED ORDER — ASPIRIN EC 81 MG PO TBEC: 81.0000 mg | DELAYED_RELEASE_TABLET | Freq: Every day | ORAL | Status: DC

## 2020-03-21 MED ORDER — SODIUM CHLORIDE 0.9% FLUSH: 3.0000 mL | Freq: Two times a day (BID) | INTRAVENOUS | Status: DC

## 2020-03-21 MED ORDER — SODIUM CHLORIDE 0.9 % IV SOLN
INTRAVENOUS | Status: AC | PRN
  Administered 2020-03-21: 50 mL/h via INTRAVENOUS

## 2020-03-21 MED ORDER — SODIUM CHLORIDE 0.9 % IV SOLN: 250.0000 mL | INTRAVENOUS | Status: DC | PRN

## 2020-03-21 MED ORDER — FLUTICASONE FUROATE-VILANTEROL 100-25 MCG/INH IN AEPB: 1.0000 | INHALATION_SPRAY | Freq: Every day | RESPIRATORY_TRACT | Status: DC

## 2020-03-21 MED ORDER — ATORVASTATIN CALCIUM 10 MG PO TABS: 40.0000 mg | ORAL_TABLET | Freq: Every evening | ORAL | Status: DC

## 2020-03-21 MED ORDER — ASPIRIN 81 MG PO CHEW
324.0000 mg | CHEWABLE_TABLET | ORAL | Status: AC
  Administered 2020-03-21: 324 mg via ORAL
  Filled 2020-03-21: qty 4

## 2020-03-21 MED ORDER — ASPIRIN 81 MG PO CHEW
81.0000 mg | CHEWABLE_TABLET | ORAL | Status: DC
  Filled 2020-03-21: qty 1

## 2020-03-21 MED ORDER — PANTOPRAZOLE SODIUM 40 MG PO TBEC: 40.0000 mg | DELAYED_RELEASE_TABLET | Freq: Every day | ORAL | Status: DC

## 2020-03-21 MED ORDER — ONDANSETRON HCL 4 MG/2ML IJ SOLN: 4.0000 mg | Freq: Four times a day (QID) | INTRAMUSCULAR | Status: DC | PRN

## 2020-03-21 MED ORDER — IPRATROPIUM-ALBUTEROL 0.5-2.5 (3) MG/3ML IN SOLN: 3.0000 mL | Freq: Four times a day (QID) | RESPIRATORY_TRACT | Status: DC | PRN

## 2020-03-21 MED ORDER — VERAPAMIL HCL 2.5 MG/ML IV SOLN
INTRAVENOUS | Status: AC
  Filled 2020-03-21: qty 2

## 2020-03-21 MED ORDER — NITROGLYCERIN 0.4 MG SL SUBL: 0.4000 mg | SUBLINGUAL_TABLET | SUBLINGUAL | Status: DC | PRN

## 2020-03-21 MED ORDER — ACETAMINOPHEN 500 MG PO TABS
500.0000 mg | ORAL_TABLET | Freq: Four times a day (QID) | ORAL | Status: DC | PRN
  Administered 2020-03-21: 500 mg via ORAL
  Filled 2020-03-21: qty 1

## 2020-03-21 MED ORDER — HEPARIN (PORCINE) IN NACL 1000-0.9 UT/500ML-% IV SOLN
INTRAVENOUS | Status: DC | PRN
  Administered 2020-03-21: 500 mL

## 2020-03-21 MED ORDER — LIDOCAINE HCL (PF) 1 % IJ SOLN
INTRAMUSCULAR | Status: DC | PRN
  Administered 2020-03-21: 15 mL
  Administered 2020-03-21: 2 mL

## 2020-03-21 MED ORDER — VITAMIN D 25 MCG (1000 UNIT) PO TABS
4000.0000 [IU] | ORAL_TABLET | Freq: Every day | ORAL | Status: DC
  Administered 2020-03-22: 4000 [IU] via ORAL
  Filled 2020-03-21 (×2): qty 4

## 2020-03-21 MED ORDER — MIDAZOLAM HCL 2 MG/2ML IJ SOLN
INTRAMUSCULAR | Status: AC
  Filled 2020-03-21: qty 2

## 2020-03-21 MED ORDER — SODIUM CHLORIDE 0.9% FLUSH
3.0000 mL | Freq: Two times a day (BID) | INTRAVENOUS | Status: DC
  Administered 2020-03-21 – 2020-03-22 (×2): 3 mL via INTRAVENOUS

## 2020-03-21 MED ORDER — HEPARIN SODIUM (PORCINE) 5000 UNIT/ML IJ SOLN: 5000.0000 [IU] | Freq: Three times a day (TID) | INTRAMUSCULAR | Status: DC

## 2020-03-21 MED ORDER — HEPARIN SODIUM (PORCINE) 1000 UNIT/ML IJ SOLN
INTRAMUSCULAR | Status: DC | PRN
  Administered 2020-03-21: 5000 [IU] via INTRAVENOUS

## 2020-03-21 MED ORDER — ISOSORBIDE MONONITRATE ER 30 MG PO TB24: 60.0000 mg | ORAL_TABLET | Freq: Every day | ORAL | Status: DC

## 2020-03-21 MED ORDER — ALBUTEROL SULFATE HFA 108 (90 BASE) MCG/ACT IN AERS
1.0000 | INHALATION_SPRAY | Freq: Four times a day (QID) | RESPIRATORY_TRACT | Status: DC | PRN
Start: 2020-03-21 — End: 2020-03-21

## 2020-03-21 MED ORDER — HEPARIN SODIUM (PORCINE) 1000 UNIT/ML IJ SOLN
INTRAMUSCULAR | Status: AC
  Filled 2020-03-21: qty 1

## 2020-03-21 MED ORDER — MIDAZOLAM HCL 2 MG/2ML IJ SOLN
INTRAMUSCULAR | Status: DC | PRN
  Administered 2020-03-21 (×2): 1 mg via INTRAVENOUS

## 2020-03-21 MED ORDER — HEPARIN (PORCINE) IN NACL 1000-0.9 UT/500ML-% IV SOLN
INTRAVENOUS | Status: AC
  Filled 2020-03-21: qty 1000

## 2020-03-21 MED ORDER — SODIUM CHLORIDE 0.9 % WEIGHT BASED INFUSION: 1.0000 mL/kg/h | INTRAVENOUS | Status: DC

## 2020-03-21 MED ORDER — VERAPAMIL HCL 2.5 MG/ML IV SOLN
INTRAVENOUS | Status: DC | PRN
  Administered 2020-03-21: 10 mL via INTRA_ARTERIAL

## 2020-03-21 MED ORDER — ACETAMINOPHEN 325 MG PO TABS: 650.0000 mg | ORAL_TABLET | ORAL | Status: DC | PRN

## 2020-03-21 MED ORDER — CARVEDILOL 3.125 MG PO TABS: 3.1250 mg | ORAL_TABLET | Freq: Two times a day (BID) | ORAL | Status: DC

## 2020-03-21 MED ORDER — IOHEXOL 350 MG/ML SOLN
INTRAVENOUS | Status: DC | PRN
  Administered 2020-03-21: 60 mL

## 2020-03-21 MED ORDER — ESCITALOPRAM OXALATE 10 MG PO TABS: 20.0000 mg | ORAL_TABLET | Freq: Every day | ORAL | Status: DC

## 2020-03-21 MED ORDER — SODIUM CHLORIDE 0.9 % IV SOLN: INTRAVENOUS | Status: AC

## 2020-03-21 MED ORDER — ASPIRIN 300 MG RE SUPP: 300.0000 mg | RECTAL | Status: AC

## 2020-03-21 MED ORDER — ALBUTEROL SULFATE HFA 108 (90 BASE) MCG/ACT IN AERS
2.0000 | INHALATION_SPRAY | RESPIRATORY_TRACT | Status: DC | PRN
  Filled 2020-03-21: qty 6.7

## 2020-03-21 MED ORDER — LIDOCAINE HCL (PF) 1 % IJ SOLN
INTRAMUSCULAR | Status: AC
  Filled 2020-03-21: qty 30

## 2020-03-21 SURGICAL SUPPLY — 22 items
BAG SNAP BAND KOVER 36X36 (MISCELLANEOUS) ×2 IMPLANT
CATH INFINITI 5 FR IM (CATHETERS) ×2 IMPLANT
CATH INFINITI 5FR ANG PIGTAIL (CATHETERS) ×2 IMPLANT
CATH INFINITI 5FR JL4 (CATHETERS) ×2 IMPLANT
CATH INFINITI JR4 5F (CATHETERS) ×2 IMPLANT
CATH OPTITORQUE TIG 4.0 5F (CATHETERS) ×2 IMPLANT
COVER DOME SNAP 22 D (MISCELLANEOUS) ×2 IMPLANT
DEVICE CLOSURE MYNXGRIP 5F (Vascular Products) ×2 IMPLANT
DEVICE RAD COMP TR BAND LRG (VASCULAR PRODUCTS) ×2 IMPLANT
GLIDESHEATH SLEND SS 6F .021 (SHEATH) ×2 IMPLANT
GUIDEWIRE ANGLED .035X150CM (WIRE) ×2 IMPLANT
GUIDEWIRE INQWIRE 1.5J.035X260 (WIRE) ×1 IMPLANT
INQWIRE 1.5J .035X260CM (WIRE) ×2
KIT HEART LEFT (KITS) ×2 IMPLANT
KIT MICROPUNCTURE NIT STIFF (SHEATH) ×2 IMPLANT
PACK CARDIAC CATHETERIZATION (CUSTOM PROCEDURE TRAY) ×2 IMPLANT
SHEATH PINNACLE 5F 10CM (SHEATH) ×2 IMPLANT
SHEATH PROBE COVER 6X72 (BAG) ×2 IMPLANT
TRANSDUCER W/STOPCOCK (MISCELLANEOUS) ×2 IMPLANT
TUBING CIL FLEX 10 FLL-RA (TUBING) ×2 IMPLANT
WIRE EMERALD 3MM-J .035X150CM (WIRE) ×2 IMPLANT
WIRE HI TORQ VERSACORE-J 145CM (WIRE) ×2 IMPLANT

## 2020-03-21 NOTE — ED Provider Notes (Signed)
[facility] EMERGENCY DEPARTMENT Provider Note   CSN: [id number] Arrival date & time: 03/20/20  1846     History Chief Complaint  Patient presents with  . Chest Pain    Diane Barker is a 70 y.o. female.  Patient presents after episode of chest pressure diaphoresis yesterday evening. No recent exertional symptoms. Patient has history of coronary artery disease. Patient was told to come in immediately if she ever had chest pain to the ER. Patient has a cardiologist unsure name that she follows up with outpatient. Patient denies fevers chills shortness of breath or cough. Chest pressure nonradiating and resolved last night after less than an hour.     HPI: A 70 year old patient with a history of hypercholesterolemia and obesity presents for evaluation of chest pain. Initial onset of pain was more than 6 hours ago. The patient's chest pain is described as heaviness/pressure/tightness and is not worse with exertion. The patient complains of nausea and reports some diaphoresis. The patient's chest pain is middle- or left-sided, is not well-localized, is not sharp and does not radiate to the arms/jaw/neck. The patient has no history of stroke, has no history of peripheral artery disease, has not smoked in the past 90 days, denies any history of treated diabetes, has no relevant family history of coronary artery disease (first degree relative at less than age 35) and is not hypertensive.   Past Medical History:  Diagnosis Date  . Anxiety   . Arthritis    "all over my body" (01/15/2018)  . Breast cancer, right breast ([place]) 2000   Rt lumpectomy, Chemo/XRT/rt axillary node   . Bronchial asthma   . Chronic lower back pain   . Depression   . Diverticulosis   . DVT (deep venous thrombosis) ([place]) 06/2013   RLE  . GERD (gastroesophageal reflux disease)   . High cholesterol   . Hx of adenomatous colonic polyps   . Hypertension   . Obesity   . Osteoarthritis   . Personal  history of chemotherapy   . Personal history of radiation therapy   . Pulmonary embolism ([place]) 06/2013   "both lungs"  . Spondylolisthesis     Patient Active Problem List   Diagnosis Date Noted  . AB (asthmatic bronchitis), mild persistent, with acute exacerbation   . Unstable angina ([facility]) 01/16/2018  . Primary osteoarthritis of both knees 07/29/2017  . Cellulitis of right arm 10/07/2016  . Sepsis ([place]) 10/07/2016  . Chest pain 10/07/2016  . Dyspnea on exertion 05/24/2015  . DVT, lower extremity ([place]) 05/09/2014  . Right calf pain 06/21/2013  . Pulmonary embolism ([place]) 06/21/2013  . Dysphagia 06/27/2011  . Cough 06/27/2011  . CHEST PAIN 01/18/2009  . RHINITIS 04/13/2007  . Obstructive chronic bronchitis with acute bronchitis ([place]) 04/13/2007  . GERD 04/13/2007  . Depression 01/21/2007  . OSTEOARTHRITIS 01/21/2007  . LOW BACK PAIN 01/21/2007  . BREAST CANCER, HX OF 01/21/2007    Past Surgical History:  Procedure Laterality Date  . BREAST BIOPSY Right 2000  . BREAST LUMPECTOMY Right 2000   for CA txed w/ chemo and radiation, right  . CARDIAC CATHETERIZATION    . HERNIA REPAIR    . INTRAVASCULAR PRESSURE WIRE/FFR STUDY N/A 01/18/2018   Procedure: INTRAVASCULAR PRESSURE WIRE/FFR STUDY;  Surgeon: Burnell Blanks, MD;  Location: [facility];  Service: Cardiovascular;  Laterality: N/A;  . LEFT HEART CATH AND CORONARY ANGIOGRAPHY N/A 01/18/2018   Procedure: LEFT HEART CATH AND CORONARY ANGIOGRAPHY;  Surgeon: Burnell Blanks, MD;  Location: [facility];  Service: Cardiovascular;  Laterality: N/A;  . TUBAL LIGATION    . UMBILICAL HERNIA REPAIR  1983     OB History   No obstetric history on file.     Family History  Problem Relation Age of Onset  . Asthma Mother   . COPD Mother   . Heart attack Mother   . Diabetes Mother   . Lung cancer Father   . Asthma Daughter   . Kidney failure Brother   . Diabetes Brother        x 2  . Heart attack  Brother   . Breast cancer Maternal Aunt   . Colon cancer Paternal Aunt   . Diabetes Daughter        x 2  . Hypertension Brother     Social History   Tobacco Use  . Smoking status: Never Smoker  . Smokeless tobacco: Never Used  Vaping Use  . Vaping Use: Never used  Substance Use Topics  . Alcohol use: Never    Alcohol/week: 0.0 standard drinks  . Drug use: Never    Home Medications Prior to Admission medications   Medication Sig Start Date End Date Taking? Authorizing Provider  acetaminophen (TYLENOL) 500 MG tablet Take 500 mg by mouth every 6 (six) hours as needed for mild pain.    [provider]  albuterol (VENTOLIN HFA) 108 (90 Base) MCG/ACT inhaler Inhale 1-2 puffs into the lungs every 6 (six) hours as needed for wheezing or shortness of breath.    [provider]  atorvastatin (LIPITOR) 40 MG tablet Take 40 mg by mouth every evening.    [provider]  budesonide-formoterol (SYMBICORT) 80-4.5 MCG/ACT inhaler Inhale 2 puffs, then rinse mouth, twice daily maintenance 09/01/19   Baird Lyons D, MD  Cholecalciferol (VITAMIN D) 2000 UNITS CAPS Take 4,000 Units by mouth daily.     [provider]  clotrimazole (CLOTRIMAZOLE ANTI-FUNGAL) 1 % cream Apply 1 application topically 2 (two) times daily.    [provider]  dexlansoprazole (DEXILANT) 60 MG capsule Take 60 mg by mouth daily.    [provider]  escitalopram (LEXAPRO) 10 MG tablet Take 20 mg by mouth.     [provider]  furosemide (LASIX) 20 MG tablet Take 1 tablet (20 mg total) by mouth daily. 12/31/18   Elouise Munroe, MD  ipratropium-albuterol (DUONEB) 0.5-2.5 (3) MG/3ML SOLN USE 1 VIAL IN NEBULIZER EVERY 6 HOURS AS NEEDED 01/02/20   Baird Lyons D, MD  isosorbide mononitrate (IMDUR) 60 MG 24 hr tablet Take 1 tablet (60 mg total) by mouth daily. 07/06/19   Elouise Munroe, MD  nitroGLYCERIN (NITROSTAT) 0.4 MG SL tablet Place 1 tablet (0.4 mg total)  under the tongue every 5 (five) minutes as needed. 07/06/19   Elouise Munroe, MD  ofloxacin (OCUFLOX) 0.3 % ophthalmic solution Place 1 drop into the left eye 4 (four) times daily. 11/18/19   [provider]  prednisoLONE acetate (PRED FORTE) 1 % ophthalmic suspension Place into the left eye. 11/18/19   [provider]  PROAIR HFA 108 (90 Base) MCG/ACT inhaler Inhale 2 puffs into the lungs every 4 (four) hours as needed for wheezing or shortness of breath. 09/01/19   Deneise Lever, MD  rivaroxaban (XARELTO) 20 MG TABS tablet Take 20 mg by mouth daily with supper.    [provider]  Spacer/Aero-Holding Chambers (AEROCHAMBER MV) inhaler Use as  instructed 07/30/18   Deneise Lever, MD  vitamin B-12 (CYANOCOBALAMIN) 500 MCG tablet Take 500 mcg by mouth daily.    [provider]    Allergies    Erythromycin, Erythromycin, Gabapentin, Gabapentin, Morphine, Morphine and related, Penicillins, Penicillins, and Relafen [nabumetone]  Review of Systems   Review of Systems  Constitutional: Positive for diaphoresis. Negative for chills and fever.  HENT: Negative for congestion.   Eyes: Negative for visual disturbance.  Respiratory: Negative for shortness of breath.   Cardiovascular: Positive for chest pain. Negative for leg swelling.  Gastrointestinal: Positive for nausea. Negative for abdominal pain and vomiting.  Genitourinary: Negative for dysuria and flank pain.  Musculoskeletal: Negative for back pain, neck pain and neck stiffness.  Skin: Negative for rash.  Neurological: Negative for light-headedness and headaches.    Physical Exam Updated Vital Signs BP (!) 146/68   Pulse 89   Temp 97.8 F (36.6 C) (Oral)   Resp 17   Ht 5' 5.5" (1.664 m)   Wt 115.7 kg   SpO2 100%   BMI 41.79 kg/m   Physical Exam Vitals and nursing note reviewed.  Constitutional:      Appearance: She is well-developed and well-nourished.  HENT:     Head: Normocephalic and  atraumatic.  Eyes:     General:        Right eye: No discharge.        Left eye: No discharge.     Conjunctiva/sclera: Conjunctivae normal.  Neck:     Trachea: No tracheal deviation.  Cardiovascular:     Rate and Rhythm: Normal rate and regular rhythm.  Pulmonary:     Effort: Pulmonary effort is normal.     Breath sounds: Normal breath sounds.  Abdominal:     General: There is no distension.     Palpations: Abdomen is soft.     Tenderness: There is no abdominal tenderness. There is no guarding.  Musculoskeletal:        General: No edema.     Cervical back: Normal range of motion and neck supple.     Right lower leg: No edema.     Left lower leg: No edema.  Skin:    General: Skin is warm.     Findings: No rash.  Neurological:     Mental Status: She is alert and oriented to person, place, and time.  Psychiatric:        Mood and Affect: Mood and affect normal.     ED Results / Procedures / Treatments   Labs (all labs ordered are listed, but only abnormal results are displayed) Labs Reviewed  BASIC METABOLIC PANEL - Abnormal; Notable for the following components:      Result Value   Glucose, Bld 131 (*)    Creatinine, Ser 1.09 (*)    GFR, Estimated 55 (*)    All other components within normal limits  CBC - Abnormal; Notable for the following components:   Hemoglobin 15.1 (*)    MCH 34.1 (*)    All other components within normal limits  CREATININE, SERUM - Abnormal; Notable for the following components:   Creatinine, Ser 1.01 (*)    GFR, Estimated 60 (*)    All other components within normal limits  RESP PANEL BY RT-PCR (FLU A&B, COVID) ARPGX2  HIV ANTIBODY (ROUTINE TESTING W REFLEX)  CBC  TROPONIN I (HIGH SENSITIVITY)  TROPONIN I (HIGH SENSITIVITY)    EKG EKG Interpretation  Date/Time:  Tuesday March 20 2020  18:49:31 EST Ventricular Rate:  92 PR Interval:  176 QRS Duration: 96 QT Interval:  344 QTC Calculation: 425 R Axis:   -35 Text  Interpretation: Normal sinus rhythm Left axis deviation Left ventricular hypertrophy with repolarization abnormality ( R in aVL , Cornell product ) Abnormal ECG Confirmed by Blane Ohara [phone]) on 03/21/2020 7:51:14 AM   Radiology CARDIAC CATHETERIZATION  Addendum Date: 03/21/2020    Ost 1st Diag lesion is 30% stenosed.  Ost LAD to Prox LAD lesion is 10% stenosed.  Ost 2nd Diag lesion is 50% stenosed.  Mid LAD lesion is 40% stenosed.  Prox LAD lesion is 65% stenosed.  Prox Cx to Mid Cx lesion is 30% stenosed.  Ost RCA to Prox RCA lesion is 20% stenosed.  Ost RPDA to RPDA lesion is 20% stenosed.  Mid RCA lesion is 20% stenosed.  The left ventricular systolic function is normal.  LV end diastolic pressure is normal.  The left ventricular ejection fraction is 55-65% by visual estimate.  1.  Stable moderate one-vessel coronary artery disease with 60 to 70% stenosis in the proximal right coronary artery which is eccentric and calcified at the origin of the first diagonal.  The mid lesion seems to be better than before at 40%.  No evidence of obstructive disease. 2.  Normal LV systolic function normal left ventricular end-diastolic pressure. Recommendations: There might be slight progression in the proximal LAD disease but still not enough to cause unstable angina and symptoms at rest.  Recommend continuing medical therapy.  If the patient starts having anginal symptoms on a regular basis, PCI of the LAD can be considered but will likely require atherectomy or intravascular lithotripsy and protection of the first diagonal. Repeat Xarelto tomorrow if no bleeding issues. Avoid catheterization via the left radial artery in the future given inability to get the wire into the ascending aorta.  In the past, catheterization via the right radial artery was possible but also was difficult due to tortuosity of the innominate artery.   Result Date: 03/21/2020  Ost 1st Diag lesion is 30% stenosed.  Ost LAD to  Prox LAD lesion is 10% stenosed.  Ost 2nd Diag lesion is 50% stenosed.  Mid LAD lesion is 40% stenosed.  Prox LAD lesion is 65% stenosed.  Prox Cx to Mid Cx lesion is 30% stenosed.  Ost RCA to Prox RCA lesion is 20% stenosed.  Ost RPDA to RPDA lesion is 20% stenosed.  Mid RCA lesion is 20% stenosed.  The left ventricular systolic function is normal.  LV end diastolic pressure is normal.  The left ventricular ejection fraction is 55-65% by visual estimate.  1.  Stable moderate one-vessel coronary artery disease with 60 to 70% stenosis in the proximal right coronary artery which is eccentric and calcified at the origin of the first diagonal.  The mid lesion seems to be better than before at 40%.  No evidence of obstructive disease. 2.  Normal LV systolic function normal left ventricular end-diastolic pressure. Recommendations: There might be slight progression in the proximal LAD disease but still not enough to cause unstable angina and symptoms at rest.  Recommend continuing medical therapy.  If the patient starts having anginal symptoms on a regular basis, PCI of the LAD can be considered but will likely require atherectomy or intravascular lithotripsy and protection of the first diagonal. Avoid catheterization via the left radial artery in the future given inability to get the wire into the ascending aorta.  In the past, catheterization via  the right radial artery was possible but also was difficult due to tortuosity of the innominate artery.    Procedures Procedures (including critical care time)  Medications Ordered in ED Medications  Vitamin D CAPS 4,000 Units (has no administration in time range)  vitamin B-12 (CYANOCOBALAMIN) tablet 500 mcg (has no administration in time range)  acetaminophen (TYLENOL) tablet 500 mg (has no administration in time range)  furosemide (LASIX) tablet 20 mg (has no administration in time range)  nitroGLYCERIN (NITROSTAT) SL tablet 0.4 mg (has no administration  in time range)  albuterol (VENTOLIN HFA) 108 (90 Base) MCG/ACT inhaler 2 puff (has no administration in time range)  ofloxacin (OCUFLOX) 0.3 % ophthalmic solution 1 drop (has no administration in time range)  albuterol (VENTOLIN HFA) 108 (90 Base) MCG/ACT inhaler 1-2 puff (has no administration in time range)  ondansetron (ZOFRAN) injection 4 mg (has no administration in time range)  0.9 %  sodium chloride infusion (has no administration in time range)  sodium chloride flush (NS) 0.9 % injection 3 mL (has no administration in time range)  sodium chloride flush (NS) 0.9 % injection 3 mL (has no administration in time range)  0.9 %  sodium chloride infusion (has no administration in time range)  aspirin chewable tablet 324 mg (324 mg Oral Given 03/21/20 1346)    Or  aspirin suppository 300 mg ( Rectal See Alternative 03/21/20 1346)  0.9 %  sodium chloride infusion (50 mL/hr Intravenous New Bag/Given 03/21/20 1416)    ED Course  I have reviewed the triage vital signs and the nursing notes.  Pertinent labs & imaging results that were available during my care of the patient were reviewed by me and considered in my medical decision making (see chart for details).    MDM Rules/Calculators/A&P HEAR Score: 7                        Patient with known coronary artery disease history, on Xarelto for previous pulmonary embolism presents after episode of chest pressure last night that resolved. With patient's risk factors and history she is high risk. Fortunately chest pain-free, no shortness of breath, negative troponin, no ischemic acute findings on EKG reviewed. Blood work reviewed mild elevated creatinine 1.09, mild elevated glucose 131. Troponin negative.   Discussed with cardiology for consult for either close outpatient follow-up or admission.  Cardiology recommended admission for cath.    Final Clinical Impression(s) / ED Diagnoses Final diagnoses:  Acute chest pain  History of coronary artery  disease    Rx / DC Orders ED Discharge Orders    None       Elnora Morrison, MD 03/21/20 1546

## 2020-03-21 NOTE — Interval H&P Note (Signed)
Cath Lab Visit (complete for each Cath Lab visit)  Clinical Evaluation Leading to the Procedure:   ACS: Yes.   Unstable angina  Non-ACS:     History and Physical Interval Note:  03/21/2020 2:17 PM  Diane Barker  has presented today for surgery, with the diagnosis of unstable angina.  The various methods of treatment have been discussed with the patient and family. After consideration of risks, benefits and other options for treatment, the patient has consented to  Procedure(s): LEFT HEART CATH AND CORONARY ANGIOGRAPHY (N/A) as a surgical intervention.  The patient's history has been reviewed, patient examined, no change in status, stable for surgery.  I have reviewed the patient's chart and labs.  Questions were answered to the patient's satisfaction.     Lorine Bears

## 2020-03-21 NOTE — H&P (Addendum)
Cardiology Admission History and Physical:   Patient ID: Diane Barker MRN: [id number]; DOB: 03-14-1951   Admission date: 03/20/2020  Primary Care Provider: Creola Corn, MD [facility] Cardiologist: Parke Poisson, MD  [facility] Electrophysiologist:  None   Chief Complaint:  Chest pain  Patient Profile:   Diane Barker is a 70 y.o. female with a history of nonobstructive CAD, GERD, HLD, HTN, obesity, and DVT and bilateral PE (2015) now on xarelto. She presented to [facility] with chest pain.   History of Present Illness:   Diane Barker has been followed by our service for chest pain and syncope. She had an abnormal CT coronary 01/16/18 which lead to a heart cath that showed nonobstructive disease in the LAD (60% proximal lesion) - not significant by DFR and FFR, no intervention. Medical therapy was pursued. Echocardiogram at that time showed normal EF, grade 1 DD, and trivial MR. Unfortunately, she did not tolerate lopressor due to depressed mood, but was also experiencing several social stressors at that time. Mood improved off of lopressor. She suffered two falls that resulted in a heart monitor which was negative for significant arrhythmias, symptoms associated with sinus rhythm. She was seen in clinic in Sept for preop clearance and reported chest pain in August 2021 relieved with tylenol, but not with nitro, felt to be MSK in origin. She proceeded with surgery without complications. She was seen in clinic 12/28/19 by Dr. Jacques Navy. She was urged to present to the ER with any sustained chest pain for consideration of repeat heart cath.   She is maintained on 40 mg lipitor, 20 mg lasix, 60 mg imdur, and 20 mg xarelto. No ASA given AC.   She presented to [facility] yesterday with chest pain after a BM. She is very sedentary. She is able to walk up 10 steps at her daughter's house but is very short of breath afterwards. At home, she only ambulates in the house. She has not had chest pain since her MSK  pain last August. She reports ambulating to the bathroom and having a BM, not constipated, no straining. Afterwards, she felt sudden onset of 8/10 chest pressure that was associated with shortness of breath, diaphoresis, nausea, and severe weakness. Initially, she was too weak to clean up. She was tearful fearing she was having a heart attack. CP subsided after about 5 min, but she continued to have nausea and was clammy. EMS was called and administered 324 mg ASA. She reports this episode of chest pain is much worse than her CP in 2019. She has a strong family history of heart disease (2 brothers, mother), is a nonsmoker, and not diabetic.  She has been chest pain free since being in the ER. Troponin x 2 negative, EKG nonischemic. Cardiology is asked to evaluate for possible heart cath.   She has a history of falls. Her last fall was last week. She denies LOC. She has fallen twice in the last 2 months - these have been mechanical falls, but also reports she may not be lifting her left foot high enough.    Past Medical History:  Diagnosis Date  . Anxiety   . Arthritis    "all over my body" (01/15/2018)  . Breast cancer, right breast (HCC) 2000   Rt lumpectomy, Chemo/XRT/rt axillary node   . Bronchial asthma   . Chronic lower back pain   . Depression   . Diverticulosis   . DVT (deep venous thrombosis) (HCC) 06/2013   RLE  .  GERD (gastroesophageal reflux disease)   . High cholesterol   . Hx of adenomatous colonic polyps   . Hypertension   . Obesity   . Osteoarthritis   . Personal history of chemotherapy   . Personal history of radiation therapy   . Pulmonary embolism ([place]) 06/2013   "both lungs"  . Spondylolisthesis     Past Surgical History:  Procedure Laterality Date  . BREAST BIOPSY Right 2000  . BREAST LUMPECTOMY Right 2000   for CA txed w/ chemo and radiation, right  . CARDIAC CATHETERIZATION    . HERNIA REPAIR    . INTRAVASCULAR PRESSURE WIRE/FFR STUDY N/A 01/18/2018    Procedure: INTRAVASCULAR PRESSURE WIRE/FFR STUDY;  Surgeon: Burnell Blanks, MD;  Location: [place] [facility];  Service: Cardiovascular;  Laterality: N/A;  . LEFT HEART CATH AND CORONARY ANGIOGRAPHY N/A 01/18/2018   Procedure: LEFT HEART CATH AND CORONARY ANGIOGRAPHY;  Surgeon: Burnell Blanks, MD;  Location: [place] [facility];  Service: Cardiovascular;  Laterality: N/A;  . TUBAL LIGATION    . UMBILICAL HERNIA REPAIR  1983     Medications Prior to Admission: Prior to Admission medications   Medication Sig Start Date End Date Taking? Authorizing Provider  acetaminophen (TYLENOL) 500 MG tablet Take 500 mg by mouth every 6 (six) hours as needed for mild pain.    [provider]  albuterol (VENTOLIN HFA) 108 (90 Base) MCG/ACT inhaler Inhale 1-2 puffs into the lungs every 6 (six) hours as needed for wheezing or shortness of breath.    [provider]  atorvastatin (LIPITOR) 40 MG tablet Take 40 mg by mouth every evening.    [provider]  budesonide-formoterol (SYMBICORT) 80-4.5 MCG/ACT inhaler Inhale 2 puffs, then rinse mouth, twice daily maintenance 09/01/19   Baird Lyons D, MD  Cholecalciferol (VITAMIN D) 2000 UNITS CAPS Take 4,000 Units by mouth daily.     [provider]  clotrimazole (CLOTRIMAZOLE ANTI-FUNGAL) 1 % cream Apply 1 application topically 2 (two) times daily.    [provider]  dexlansoprazole (DEXILANT) 60 MG capsule Take 60 mg by mouth daily.    [provider]  escitalopram (LEXAPRO) 10 MG tablet Take 20 mg by mouth.     [provider]  furosemide (LASIX) 20 MG tablet Take 1 tablet (20 mg total) by mouth daily. 12/31/18   Elouise Munroe, MD  ipratropium-albuterol (DUONEB) 0.5-2.5 (3) MG/3ML SOLN USE 1 VIAL IN NEBULIZER EVERY 6 HOURS AS NEEDED 01/02/20   Baird Lyons D, MD  isosorbide mononitrate (IMDUR) 60 MG 24 hr tablet Take 1 tablet (60 mg total) by mouth daily. 07/06/19   Elouise Munroe, MD  nitroGLYCERIN (NITROSTAT) 0.4 MG SL tablet Place 1 tablet (0.4 mg total) under the tongue every 5 (five) minutes as needed. 07/06/19   Elouise Munroe, MD  ofloxacin (OCUFLOX) 0.3 % ophthalmic solution Place 1 drop into the left eye 4 (four) times daily. 11/18/19   [provider]  prednisoLONE acetate (PRED FORTE) 1 % ophthalmic suspension Place into the left eye. 11/18/19   [provider]  PROAIR HFA 108 (90 Base) MCG/ACT inhaler Inhale 2 puffs into the lungs every 4 (four) hours as needed for wheezing or shortness of breath. 09/01/19   Deneise Lever, MD  rivaroxaban (XARELTO) 20 MG TABS tablet Take 20 mg by mouth daily with supper.    [provider]  Spacer/Aero-Holding Chambers (AEROCHAMBER MV) inhaler Use as instructed 07/30/18   Baird Lyons  D, MD  vitamin B-12 (CYANOCOBALAMIN) 500 MCG tablet Take 500 mcg by mouth daily.    [provider]     Allergies:    Allergies  Allergen Reactions  . Erythromycin Other (See Comments)    REACTION: stomach cramps  . Erythromycin Other (See Comments)    Stomach cramps  . Gabapentin     Causes blackouts  . Gabapentin Other (See Comments)    Black out  . Morphine Other (See Comments)    REACTION: Hallucinations  . Morphine And Related Other (See Comments)    hallucinations  . Penicillins Other (See Comments)    REACTION: unkkown, was a child  . Penicillins Other (See Comments)    Reaction unknown Pt was a child  . Relafen [Nabumetone] Nausea Only    Social History:   Social History   Socioeconomic History  . Marital status: Single    Spouse name: Not on file  . Number of children: 2  . Years of education: 10  . Highest education level: Not on file  Occupational History  . 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 Smoking status: Never Smoker  . Smokeless tobacco: Never Used  Vaping Use  . Vaping Use: Never used  Substance and  Sexual Activity  . Alcohol use: Never    Alcohol/week: 0.0 standard drinks  . Drug use: Never  . Sexual activity: Not Currently  Other Topics Concern  . Not on file  Social History Narrative   ** Merged History Encounter **       Lives at home alone Caffeine use- soda 16-24 oz weekly   Social Determinants of Health   Financial Resource Strain: Not on file  Food Insecurity: Not on file  Transportation Needs: Not on file  Physical Activity: Not on file  Stress: Not on file  Social Connections: Not on file  Intimate Partner Violence: Not on file    Family History:   The patient's family history includes Asthma in her daughter and mother; Breast cancer in her maternal aunt; COPD in her mother; Colon cancer in her paternal aunt; Diabetes in her brother, daughter, and mother; Heart attack in her brother and mother; Hypertension in her brother; Kidney failure in her brother; Lung cancer in her father.    ROS:  Please see the history of present illness.  All other ROS reviewed and negative.     Physical Exam/Data:   Vitals:   03/21/20 0626 03/21/20 0810 03/21/20 0900 03/21/20 0945  BP: 133/86 (!) 143/75 129/74 (!) 128/59  Pulse: 79 79 86 75  Resp: 18 19 (!) 23 11  Temp: 98.4 F (36.9 C) 97.8 F (36.6 C)    TempSrc: Oral Oral    SpO2: 97% 100% 97% 97%  Weight:      Height:       No intake or output data in the 24 hours ending 03/21/20 1023 Last 3 Weights 03/20/2020 12/28/2019 11/24/2019  Weight (lbs) 255 lb 254 lb 6.4 oz 252 lb 12.8 oz  Weight (kg) 115.667 kg 115.395 kg 114.669 kg     Body mass index is 41.79 kg/m.  General:  Obese female in NAD HEENT: normal Neck: no JVD Vascular: No carotid bruits Cardiac:  normal S1, S2; RRR; no murmur Lungs:  clear to auscultation bilaterally, no wheezing, rhonchi or rales  Abd: soft, nontender, no hepatomegaly  Ext: no edema Musculoskeletal:  No deformities, BUE and BLE  strength normal and equal Skin: warm and dry  Neuro:  CNs  2-12 intact, no focal abnormalities noted Psych:  Normal affect   EKG:  The ECG that was done was personally reviewed and demonstrates sinus rhythm HR 92, left axis deviation, LVH - stable from prior  Relevant CV Studies:  Left heart cath 01/18/18  Ost RCA to Prox RCA lesion is 20% stenosed.  Ost RPDA to RPDA lesion is 20% stenosed.  Mid RCA lesion is 20% stenosed.  Prox Cx to Mid Cx lesion is 30% stenosed.  Prox LAD lesion is 60% stenosed.  Ost LAD to Prox LAD lesion is 10% stenosed.  Ost 1st Diag lesion is 30% stenosed.  Mid LAD lesion is 50% stenosed.  Ost 2nd Diag lesion is 50% stenosed.   1. Moderate, eccentric, calcified stenosis in the mid LAD at the takeoff of the first diagonal branch and moderate eccentric stenosis in the mid to distal LAD at the takeoff of the second diagonal branch. DFR and FFR analysis of the mid and distal LAD suggests the lesions are not flow limiting. In multiple angiographic views, the lesions appear to be moderate only.  2. Mild non-obstructive disease in the RCA and Circumflex  Recommendation: I would recommend a trial of medical therapy at this time given the moderate disease in her LAD. I will add a beta blocker and Imdur. If she continues to have angina despite optimal medical therapy, consideration could be given to PCI of the mid LAD. Resume DOAC tomorrow.    Echo 01/16/18: Study Conclusions  - Procedure narrative: Transthoracic echocardiography. Image  quality was poor. The study was technically difficult, as a  result of body habitus. Intravenous contrast (Definity) was  administered.  - Left ventricle: The cavity size was normal. Wall thickness was  increased in a pattern of moderate LVH. Systolic function was  normal. The estimated ejection fraction was in the range of 55%  to 60%. Wall motion was normal; there were no regional wall  motion abnormalities. Doppler parameters are consistent with  abnormal left  ventricular relaxation (grade 1 diastolic  dysfunction). The E/e&' ratio is >15, suggesting elevated LV  filling pressure.  - Mitral valve: Calcified annulus. Mildly thickened leaflets .  There was trivial regurgitation.  - Left atrium: The atrium was normal in size.  - Inferior vena cava: The vessel was normal in size. The  respirophasic diameter changes were in the normal range (>= 50%),  consistent with normal central venous pressure.   Impressions:  - Technically difficult study. Definity contrast given LVEF 55-60%,  moderate LVH, no definite regional wall motion abnormalities,  grade 1 DD, elevated LV filling pressure, trivial MR, normal LA  size, normal IVC.    Heart monitor for syncope 11/16/19: IMPRESSION: infrequent ectopy, brief episode of asymptomatic SVT. Symptoms associated with SR and sinus tachycardia. No worrisome arrhythmia.    Laboratory Data:  High Sensitivity Troponin:   Recent Labs  Lab 03/20/20 1849 03/20/20 2053  TROPONINIHS <2 4      Chemistry Recent Labs  Lab 03/20/20 1849  NA 136  K 3.8  CL 103  CO2 23  GLUCOSE 131*  BUN 14  CREATININE 1.09*  CALCIUM 9.2  GFRNONAA 55*  ANIONGAP 10    No results for input(s): PROT, ALBUMIN, AST, ALT, ALKPHOS, BILITOT in the last 168 hours. Hematology Recent Labs  Lab 03/20/20 1849  WBC 7.7  RBC 4.43  HGB 15.1*  HCT 43.9  MCV 99.1  MCH 34.1*  MCHC 34.4  RDW 12.6  PLT 240   BNPNo results for input(s): BNP, PROBNP in the last 168 hours.  DDimer No results for input(s): DDIMER in the last 168 hours.   Radiology/Studies:  No results found.   Assessment and Plan:   Chest pain Nonobstructive coronary artery disease - hs troponin x 2 negative - EKG non ischemic - pt describes symptoms concerning for unstable angina - given her known disease and symptoms, Dr. Margaretann Loveless has had a low threshold to perform repeat heart cath - her last dose of xarelto was Monday evening - she has  suffered 2 mechanical falls over the last two months, one with an eye injury - we discussed the possibility of triple therapy should she need an intervention - continue imdur, was intolerant to lopressor due to depressed mood - will start IV hydration in anticipation of heart catheterization today  - admit to cardiology, keep NPO   Hypertension - on imdur at home - has not tolerated lopressor in the past due to depressed mood, but also has several social stressors at that time (see Dr. Delphina Cahill note 04/26/18)   Hyperlipidemia - need fasting lipids - on 40 mg lipitor - LDL in 2019 was 126   Hx of bilateral PE (2015) - last dose of xarelto was Monday evening - no signs of bleeding   Left-sided weakness Hx of mechanical falls - she denies chest pain, palpitations, dizziness/lightheadedness prior to falls - generally related to not lifting her left foot high enough          :[id number]    HEAR Score (for undifferentiated chest pain):  HEAR Score: 7       Severity of Illness: The appropriate patient status for this patient is OBSERVATION. Observation status is judged to be reasonable and necessary in order to provide the required intensity of service to ensure the patient's safety. The patient's presenting symptoms, physical exam findings, and initial radiographic and laboratory data in the context of their medical condition is felt to place them at decreased risk for further clinical deterioration. Furthermore, it is anticipated that the patient will be medically stable for discharge from the hospital within 2 midnights of admission. The following factors support the patient status of observation.   " The patient's presenting symptoms include unstable angina. " The physical exam findings include obesity. " The initial radiographic and laboratory data are negative enzymes.     For questions or updates, please contact [place] Please consult [email or web address] for contact info  under     Signed, Ledora Bottcher, [place]  03/21/2020 10:23 AM   Agree with note by Fabian Sharp PA-C  We were asked to see Ms. Shoemaker for evaluation of unstable angina.  She is a patient of Dr.Acharya with history of CAD status post cardiac catheterization by Dr. Angelena Form 01/18/2018 that revealed a moderate mid LAD lesion that was FFR negative.  She had no other significant CAD and had normal LV function.  Her other problems include hypertension, hyperlipidemia and history of pulmonary embolism in the past on Xarelto last dose of which was Monday night.  She developed chest pain shortness of breath and diaphoresis while on the toilet, came to the ER for evaluation.  Her last dose of Xarelto again was Monday.  Enzymes were low and flat.  EKG showed no acute changes.  Her symptoms resolved after 20 to 30 minutes spontaneously.  Her exam is benign currently.  I think that given the severity of her  symptoms and her known prior moderate CAD I am concerned that this may have progressed.  She is also complained of some dyspnea on exertion.  Her labs otherwise are unremarkable..  I believe she will need coronary angiography. The patient understands that risks included but are not limited to stroke (1 in 1000), death (1 in 61), kidney failure [usually temporary] (1 in 500), bleeding (1 in 200), allergic reaction [possibly serious] (1 in 200). The patient understands and agrees to proceed  Lorretta Harp, M.D., [place], [facility], [place], [place] [address] [place], [place]  [id number]  [phone] 03/21/2020 10:38 AM

## 2020-03-21 NOTE — Plan of Care (Signed)

## 2020-03-22 ENCOUNTER — Encounter (HOSPITAL_COMMUNITY): Payer: Self-pay | Admitting: Cardiovascular Disease

## 2020-03-22 DIAGNOSIS — I2699 Other pulmonary embolism without acute cor pulmonale: Secondary | ICD-10-CM

## 2020-03-22 DIAGNOSIS — I200 Unstable angina: Secondary | ICD-10-CM | POA: Diagnosis not present

## 2020-03-22 DIAGNOSIS — E782 Mixed hyperlipidemia: Secondary | ICD-10-CM

## 2020-03-22 DIAGNOSIS — I25110 Atherosclerotic heart disease of native coronary artery with unstable angina pectoris: Secondary | ICD-10-CM | POA: Diagnosis not present

## 2020-03-22 LAB — CBC
HCT: 39 % (ref 36.0–46.0)
Hemoglobin: 13.1 g/dL (ref 12.0–15.0)
MCH: 32.8 pg (ref 26.0–34.0)
MCHC: 33.6 g/dL (ref 30.0–36.0)
MCV: 97.5 fL (ref 80.0–100.0)
Platelets: 201 10*3/uL (ref 150–400)
RBC: 4 MIL/uL (ref 3.87–5.11)
RDW: 12.7 % (ref 11.5–15.5)
WBC: 6.9 10*3/uL (ref 4.0–10.5)
nRBC: 0 % (ref 0.0–0.2)

## 2020-03-22 LAB — BASIC METABOLIC PANEL
Anion gap: 9 (ref 5–15)
BUN: 20 mg/dL (ref 8–23)
CO2: 20 mmol/L — ABNORMAL LOW (ref 22–32)
Calcium: 7.9 mg/dL — ABNORMAL LOW (ref 8.9–10.3)
Chloride: 100 mmol/L (ref 98–111)
Creatinine, Ser: 1.11 mg/dL — ABNORMAL HIGH (ref 0.44–1.00)
GFR, Estimated: 53 mL/min — ABNORMAL LOW (ref >60)
Glucose, Bld: 97 mg/dL (ref 70–99)
Potassium: 3.6 mmol/L (ref 3.5–5.1)
Sodium: 129 mmol/L — ABNORMAL LOW (ref 135–145)

## 2020-03-22 LAB — LIPID PANEL
Cholesterol: 130 mg/dL (ref 0–200)
HDL: 46 mg/dL (ref >40)
LDL Cholesterol: 72 mg/dL (ref 0–99)
Total CHOL/HDL Ratio: 2.8 RATIO
Triglycerides: 58 mg/dL (ref <150)
VLDL: 12 mg/dL (ref 0–40)

## 2020-03-22 MED ORDER — PANTOPRAZOLE SODIUM 40 MG PO TBEC
40.0000 mg | DELAYED_RELEASE_TABLET | Freq: Every day | ORAL | Status: DC
  Administered 2020-03-22: 40 mg via ORAL
  Filled 2020-03-22: qty 1

## 2020-03-22 MED ORDER — ESCITALOPRAM OXALATE 10 MG PO TABS
20.0000 mg | ORAL_TABLET | Freq: Every day | ORAL | Status: DC
  Administered 2020-03-22: 20 mg via ORAL
  Filled 2020-03-22: qty 2

## 2020-03-22 MED ORDER — ATORVASTATIN CALCIUM 80 MG PO TABS: 80.0000 mg | ORAL_TABLET | Freq: Every evening | ORAL | Status: DC

## 2020-03-22 MED ORDER — CARVEDILOL 3.125 MG PO TABS
3.1250 mg | ORAL_TABLET | Freq: Two times a day (BID) | ORAL | Status: DC
  Administered 2020-03-22: 3.125 mg via ORAL
  Filled 2020-03-22: qty 1

## 2020-03-22 MED ORDER — ATORVASTATIN CALCIUM 80 MG PO TABS: 80.0000 mg | ORAL_TABLET | Freq: Every evening | ORAL | 1 refills | Status: DC

## 2020-03-22 MED ORDER — ASPIRIN EC 81 MG PO TBEC
81.0000 mg | DELAYED_RELEASE_TABLET | Freq: Every day | ORAL | Status: DC
  Administered 2020-03-22: 81 mg via ORAL
  Filled 2020-03-22: qty 1

## 2020-03-22 MED ORDER — RIVAROXABAN 20 MG PO TABS
20.0000 mg | ORAL_TABLET | Freq: Every day | ORAL | Status: DC
  Filled 2020-03-22: qty 1

## 2020-03-22 MED ORDER — IPRATROPIUM-ALBUTEROL 0.5-2.5 (3) MG/3ML IN SOLN: 3.0000 mL | Freq: Four times a day (QID) | RESPIRATORY_TRACT | Status: DC | PRN

## 2020-03-22 MED ORDER — ASPIRIN EC 81 MG PO TBEC: 81.0000 mg | DELAYED_RELEASE_TABLET | Freq: Every day | ORAL | Status: DC

## 2020-03-22 MED ORDER — ATORVASTATIN CALCIUM 40 MG PO TABS: 40.0000 mg | ORAL_TABLET | Freq: Every evening | ORAL | Status: DC

## 2020-03-22 MED ORDER — ISOSORBIDE MONONITRATE ER 60 MG PO TB24
60.0000 mg | ORAL_TABLET | Freq: Every day | ORAL | Status: DC
  Administered 2020-03-22: 60 mg via ORAL
  Filled 2020-03-22: qty 1

## 2020-03-22 NOTE — Progress Notes (Signed)
Progress Note  Patient Name: Diane Barker Date of Encounter: 03/22/2020  [facility] Cardiologist: Elouise Munroe, MD   Subjective   Postop day 1 diagnostic coronary angiogram revealing unchanged anatomy.  The patient is pain-free.  Inpatient Medications    Scheduled Meds: . aspirin EC  81 mg Oral Daily  . atorvastatin  80 mg Oral QPM  . carvedilol  3.125 mg Oral BID WC  . cholecalciferol  4,000 Units Oral Daily  . escitalopram  20 mg Oral Daily  . isosorbide mononitrate  60 mg Oral Daily  . ofloxacin  1 drop Left Eye QID  . pantoprazole  40 mg Oral Daily  . rivaroxaban  20 mg Oral Q supper  . sodium chloride flush  3 mL Intravenous Q12H  . vitamin B-12  500 mcg Oral Daily   Continuous Infusions: . sodium chloride     PRN Meds: sodium chloride, acetaminophen, albuterol, ipratropium-albuterol, nitroGLYCERIN, ondansetron (ZOFRAN) IV, sodium chloride flush   Vital Signs    Vitals:   03/21/20 1943 03/21/20 2319 03/22/20 0400 03/22/20 0746  BP: (!) 127/93 121/67 121/77 127/64  Pulse: 75 63 65 70  Resp: 20 18 17 14   Temp: 97.6 F (36.4 C) 97.9 F (36.6 C) 97.9 F (36.6 C) (!) 97.5 F (36.4 C)  TempSrc: Oral Oral Oral Oral  SpO2: 99% 100% 100% 98%  Weight:      Height:        Intake/Output Summary (Last 24 hours) at 03/22/2020 0841 Last data filed at 03/22/2020 0800 Gross per 24 hour  Intake 540 ml  Output -  Net 540 ml   Last 3 Weights 03/20/2020 12/28/2019 11/24/2019  Weight (lbs) 255 lb 254 lb 6.4 oz 252 lb 12.8 oz  Weight (kg) 115.667 kg 115.395 kg 114.669 kg      Telemetry    Normal sinus rhythm- Personally Reviewed  ECG    Not performed today- Personally Reviewed  Physical Exam   GEN: No acute distress.   Neck: No JVD Cardiac: RRR, no murmurs, rubs, or gallops.  Respiratory: Clear to auscultation bilaterally. GI: Soft, nontender, non-distended  MS: No edema; No deformity. Neuro:  Nonfocal  Psych: Normal affect   Labs    High  Sensitivity Troponin:   Recent Labs  Lab 03/20/20 1849 03/20/20 2053  TROPONINIHS <2 4      Chemistry Recent Labs  Lab 03/20/20 1849 03/21/20 1111 03/22/20 0042  NA 136  --  129*  K 3.8  --  3.6  CL 103  --  100  CO2 23  --  20*  GLUCOSE 131*  --  97  BUN 14  --  20  CREATININE 1.09* 1.01* 1.11*  CALCIUM 9.2  --  7.9*  GFRNONAA 55* 60* 53*  ANIONGAP 10  --  9     Hematology Recent Labs  Lab 03/20/20 1849 03/21/20 1111 03/22/20 0042  WBC 7.7 6.9 6.9  RBC 4.43 4.25 4.00  HGB 15.1* 14.3 13.1  HCT 43.9 41.3 39.0  MCV 99.1 97.2 97.5  MCH 34.1* 33.6 32.8  MCHC 34.4 34.6 33.6  RDW 12.6 12.9 12.7  PLT 240 210 201    BNPNo results for input(s): BNP, PROBNP in the last 168 hours.   DDimer No results for input(s): DDIMER in the last 168 hours.   Radiology    CARDIAC CATHETERIZATION  Addendum Date: 03/21/2020    Ost 1st Diag lesion is 30% stenosed.  Ost LAD to Prox LAD lesion  is 10% stenosed.  Ost 2nd Diag lesion is 50% stenosed.  Mid LAD lesion is 40% stenosed.  Prox LAD lesion is 65% stenosed.  Prox Cx to Mid Cx lesion is 30% stenosed.  Ost RCA to Prox RCA lesion is 20% stenosed.  Ost RPDA to RPDA lesion is 20% stenosed.  Mid RCA lesion is 20% stenosed.  The left ventricular systolic function is normal.  LV end diastolic pressure is normal.  The left ventricular ejection fraction is 55-65% by visual estimate.  1.  Stable moderate one-vessel coronary artery disease with 60 to 70% stenosis in the proximal right coronary artery which is eccentric and calcified at the origin of the first diagonal.  The mid lesion seems to be better than before at 40%.  No evidence of obstructive disease. 2.  Normal LV systolic function normal left ventricular end-diastolic pressure. Recommendations: There might be slight progression in the proximal LAD disease but still not enough to cause unstable angina and symptoms at rest.  Recommend continuing medical therapy.  If the patient  starts having anginal symptoms on a regular basis, PCI of the LAD can be considered but will likely require atherectomy or intravascular lithotripsy and protection of the first diagonal. Repeat Xarelto tomorrow if no bleeding issues. Avoid catheterization via the left radial artery in the future given inability to get the wire into the ascending aorta.  In the past, catheterization via the right radial artery was possible but also was difficult due to tortuosity of the innominate artery.   Result Date: 03/21/2020  Ost 1st Diag lesion is 30% stenosed.  Ost LAD to Prox LAD lesion is 10% stenosed.  Ost 2nd Diag lesion is 50% stenosed.  Mid LAD lesion is 40% stenosed.  Prox LAD lesion is 65% stenosed.  Prox Cx to Mid Cx lesion is 30% stenosed.  Ost RCA to Prox RCA lesion is 20% stenosed.  Ost RPDA to RPDA lesion is 20% stenosed.  Mid RCA lesion is 20% stenosed.  The left ventricular systolic function is normal.  LV end diastolic pressure is normal.  The left ventricular ejection fraction is 55-65% by visual estimate.  1.  Stable moderate one-vessel coronary artery disease with 60 to 70% stenosis in the proximal right coronary artery which is eccentric and calcified at the origin of the first diagonal.  The mid lesion seems to be better than before at 40%.  No evidence of obstructive disease. 2.  Normal LV systolic function normal left ventricular end-diastolic pressure. Recommendations: There might be slight progression in the proximal LAD disease but still not enough to cause unstable angina and symptoms at rest.  Recommend continuing medical therapy.  If the patient starts having anginal symptoms on a regular basis, PCI of the LAD can be considered but will likely require atherectomy or intravascular lithotripsy and protection of the first diagonal. Avoid catheterization via the left radial artery in the future given inability to get the wire into the ascending aorta.  In the past, catheterization via the  right radial artery was possible but also was difficult due to tortuosity of the innominate artery.    Cardiac Studies   Cardiac catheterization (03/21/2020)  Conclusion    Ost 1st Diag lesion is 30% stenosed.  Ost LAD to Prox LAD lesion is 10% stenosed.  Ost 2nd Diag lesion is 50% stenosed.  Mid LAD lesion is 40% stenosed.  Prox LAD lesion is 65% stenosed.  Prox Cx to Mid Cx lesion is 30% stenosed.  Ost RCA  to Prox RCA lesion is 20% stenosed.  Ost RPDA to RPDA lesion is 20% stenosed.  Mid RCA lesion is 20% stenosed.  The left ventricular systolic function is normal.  LV end diastolic pressure is normal.  The left ventricular ejection fraction is 55-65% by visual estimate.   1.  Stable moderate one-vessel coronary artery disease with 60 to 70% stenosis in the proximal right coronary artery which is eccentric and calcified at the origin of the first diagonal.  The mid lesion seems to be better than before at 40%.  No evidence of obstructive disease. 2.  Normal LV systolic function normal left ventricular end-diastolic pressure.  Recommendations: There might be slight progression in the proximal LAD disease but still not enough to cause unstable angina and symptoms at rest.  Recommend continuing medical therapy.  If the patient starts having anginal symptoms on a regular basis, PCI of the LAD can be considered but will likely require atherectomy or intravascular lithotripsy and protection of the first diagonal. Repeat Xarelto tomorrow if no bleeding issues.  Avoid catheterization via the left radial artery in the future given inability to get the wire into the ascending aorta.  In the past, catheterization via the right radial artery was possible but also was difficult due to tortuosity of the innominate artery.   Intervention      Patient Profile     SHAIANNE GROSSBERG is a 70 y.o. female with a history of nonobstructive CAD, GERD, HLD, HTN, obesity, and DVT and  bilateral PE (2015) now on xarelto. She presented to [facility] with chest pain.   Assessment & Plan    1: CAD- patient had cardiac catheterization performed 01/16/2018 that showed an intermediate lesion in the mid LAD that was nonsignificant by DFR and FFR.  She was seen in the emergency room yesterday with unstable angina.  Enzymes were negative and her EKG showed no acute changes.  Cardiac catheterization which revealed unchanged anatomy.  She is currently pain-free.  Suggest chest pain was noncardiac  2: Hyperlipidemia-check on statin therapy with an LDL of 72.  Will increase atorvastatin 40 mg to 80 mg a day.  3: Essential hypertension- blood pressures currently normal during her hospitalization on no antihypertensive medications  4: History of bilateral pulmonary emboli in 2015 on Xarelto oral anticoagulation which we will restart tonight  Patient stable for discharge with cath that showed unchanged anatomy.  Suggest chest pain was noncardiac.  TOC 7 after which she will see Dr. Margaretann Loveless for a return office visit in 4 to 6 weeks.  For questions or updates, please contact [place] Please consult [email or web address] for contact info under        Signed, Quay Burow, MD  03/22/2020, 8:41 AM

## 2020-03-22 NOTE — Discharge Summary (Addendum)
Discharge Summary    Patient ID: Diane Barker MRN: [id number]; DOB: [birth year]  Admit date: 03/20/2020 Discharge date: 03/22/2020  Primary Care Provider: Shon Baton, MD  Primary Cardiologist: Elouise Munroe, MD  Primary Electrophysiologist:  None   Discharge Diagnoses    Active Problems:   Unstable angina [facility])    Diagnostic Studies/Procedures    Cath: 03/21/20  Ost 1st Diag lesion is 30% stenosed. Ost LAD to Prox LAD lesion is 10% stenosed. Ost 2nd Diag lesion is 50% stenosed. Mid LAD lesion is 40% stenosed. Prox LAD lesion is 65% stenosed. Prox Cx to Mid Cx lesion is 30% stenosed. Ost RCA to Prox RCA lesion is 20% stenosed. Ost RPDA to RPDA lesion is 20% stenosed. Mid RCA lesion is 20% stenosed. The left ventricular systolic function is normal. LV end diastolic pressure is normal. The left ventricular ejection fraction is 55-65% by visual estimate.   1.  Stable moderate one-vessel coronary artery disease with 60 to 70% stenosis in the proximal right coronary artery which is eccentric and calcified at the origin of the first diagonal.  The mid lesion seems to be better than before at 40%.  No evidence of obstructive disease. 2.  Normal LV systolic function normal left ventricular end-diastolic pressure.   Recommendations: There might be slight progression in the proximal LAD disease but still not enough to cause unstable angina and symptoms at rest.  Recommend continuing medical therapy.  If the patient starts having anginal symptoms on a regular basis, PCI of the LAD can be considered but will likely require atherectomy or intravascular lithotripsy and protection of the first diagonal. Repeat Xarelto tomorrow if no bleeding issues.   Avoid catheterization via the left radial artery in the future given inability to get the wire into the ascending aorta.  In the past, catheterization via the right radial artery was possible but also was difficult due to tortuosity of the  innominate artery.  Diagnostic Dominance: Right    _____________   History of Present Illness     Diane Barker is a 70 y.o. female with PMH of nonobstructive CAD, GERD, HLD, HTN, obesity, DVT and bilateral PE who presented with chest pain.   Diane Barker has been followed by our service for chest pain and syncope. She had an abnormal CT coronary 01/16/18 which lead to a heart cath that showed nonobstructive disease in the LAD (60% proximal lesion) - not significant by DFR and FFR, no intervention. Medical therapy was pursued. Echocardiogram at that time showed normal EF, grade 1 DD, and trivial MR. Unfortunately, she did not tolerate lopressor due to depressed mood, but was also experiencing several social stressors at that time. Mood improved off of lopressor. She suffered two falls that resulted in a heart monitor which was negative for significant arrhythmias, symptoms associated with sinus rhythm. She was seen in clinic in Sept for preop clearance and reported chest pain in August 2021 relieved with tylenol, but not with nitro, felt to be MSK in origin. She proceeded with surgery without complications. She was seen in clinic 12/28/19 by Dr. Margaretann Loveless. She was urged to present to the ER with any sustained chest pain for consideration of repeat heart cath.    She is maintained on 40 mg lipitor, 20 mg lasix, 60 mg imdur, and 20 mg xarelto. No ASA given AC.    She presented to [facility] 1/4 with chest pain after a BM. She reports being very sedentary at baseline. She reported being able  to walk up 10 steps at her daughter's house but would be very short of breath afterwards. At home, she only ambulates in the house. She had not had chest pain since her MSK pain last August. She reported ambulating to the bathroom and having a BM, not constipated, no straining. Afterwards, she felt sudden onset of 8/10 chest pressure that was associated with shortness of breath, diaphoresis, nausea, and severe weakness.  Initially, she was too weak to clean up. She was tearful fearing she was having a heart attack. CP subsided after about 5 min, but she continued to have nausea and was clammy. EMS was called and administered 324 mg ASA. She reports this episode of chest pain is much worse than her CP in 2019. She has a strong family history of heart disease (2 brothers, mother), is a nonsmoker, and not diabetic.   She was chest pain free since being in the ER. Troponin x 2 negative, EKG nonischemic. Cardiology was asked to evaluate for possible heart cath.    She has a history of falls. Her last fall was last week. She denied any LOC. She had fallen twice in the last 2 months - these have been mechanical falls, but also reported she may not be lifting her left foot high enough.   Given symptoms she was admitted with plans to undergo cardiac cath.     Hospital Course     1. Chest pain with hx of nonobstructive coronary artery disease: she was admitted and underwent cardiac cath noted above with stable moderate one vessel coronary disease of 60-70% in the pLAD along with 40% mRCA lesion. No evidence of obstructive disease. Normal LV function. Recommendations to continue with medical therapy. If she has recurrent anginal symptoms on a regular basis then PCI of the LAD could be considered but would require atherectomy or lithotripsy.   2. Hypertension: on imdur at home. Has not tolerated lopressor in the past due to depressed mood, but also has several social stressors at that time (see Dr. Delphina Cahill note 04/26/18)   3. Hyperlipidemia: LDL 72, home statin was increase from 40mg  to 80mg  atorvastatin daily.   4. Hx of bilateral PE (2015): will resume Xarelto at discharge    5. Left-sided weakness/Hx of mechanical falls: she denies chest pain, palpitations, dizziness/lightheadedness prior to falls - generally related to not lifting her left foot high enough  The patient was seen by Dr. Gwenlyn Found and determined stable for  discharge. Will arrange for outpatient follow up in the office as appropriate.   Did the patient have an acute coronary syndrome (MI, NSTEMI, STEMI, etc) this admission?:  No                               Did the patient have a percutaneous coronary intervention (stent / angioplasty)?:  No.       _____________  Discharge Vitals Blood pressure 127/64, pulse 70, temperature (!) 97.5 F (36.4 C), temperature source Oral, resp. rate 14, height 5' 5.5" (1.664 m), weight 115.7 kg, SpO2 98 %.  Filed Weights   03/20/20 1859  Weight: 115.7 kg    Labs & Radiologic Studies    CBC Recent Labs    03/21/20 1111 03/22/20 0042  WBC 6.9 6.9  HGB 14.3 13.1  HCT 41.3 39.0  MCV 97.2 97.5  PLT 210 123456   Basic Metabolic Panel Recent Labs    03/20/20 1849 03/21/20 1111 03/22/20 0042  NA 136  --  129*  K 3.8  --  3.6  CL 103  --  100  CO2 23  --  20*  GLUCOSE 131*  --  97  BUN 14  --  20  CREATININE 1.09* 1.01* 1.11*  CALCIUM 9.2  --  7.9*   Liver Function Tests No results for input(s): AST, ALT, ALKPHOS, BILITOT, PROT, ALBUMIN in the last 72 hours. No results for input(s): LIPASE, AMYLASE in the last 72 hours. High Sensitivity Troponin:   Recent Labs  Lab 03/20/20 1849 03/20/20 2053  TROPONINIHS <2 4    BNP Invalid input(s): POCBNP D-Dimer No results for input(s): DDIMER in the last 72 hours. Hemoglobin A1C No results for input(s): HGBA1C in the last 72 hours. Fasting Lipid Panel Recent Labs    03/22/20 0042  CHOL 130  HDL 46  LDLCALC 72  TRIG 58  CHOLHDL 2.8   Thyroid Function Tests No results for input(s): TSH, T4TOTAL, T3FREE, THYROIDAB in the last 72 hours.  Invalid input(s): FREET3 _____________  CARDIAC CATHETERIZATION  Addendum Date: 03/21/2020    Ost 1st Diag lesion is 30% stenosed.  Ost LAD to Prox LAD lesion is 10% stenosed.  Ost 2nd Diag lesion is 50% stenosed.  Mid LAD lesion is 40% stenosed.  Prox LAD lesion is 65% stenosed.  Prox Cx to Mid Cx  lesion is 30% stenosed.  Ost RCA to Prox RCA lesion is 20% stenosed.  Ost RPDA to RPDA lesion is 20% stenosed.  Mid RCA lesion is 20% stenosed.  The left ventricular systolic function is normal.  LV end diastolic pressure is normal.  The left ventricular ejection fraction is 55-65% by visual estimate.  1.  Stable moderate one-vessel coronary artery disease with 60 to 70% stenosis in the proximal right coronary artery which is eccentric and calcified at the origin of the first diagonal.  The mid lesion seems to be better than before at 40%.  No evidence of obstructive disease. 2.  Normal LV systolic function normal left ventricular end-diastolic pressure. Recommendations: There might be slight progression in the proximal LAD disease but still not enough to cause unstable angina and symptoms at rest.  Recommend continuing medical therapy.  If the patient starts having anginal symptoms on a regular basis, PCI of the LAD can be considered but will likely require atherectomy or intravascular lithotripsy and protection of the first diagonal. Repeat Xarelto tomorrow if no bleeding issues. Avoid catheterization via the left radial artery in the future given inability to get the wire into the ascending aorta.  In the past, catheterization via the right radial artery was possible but also was difficult due to tortuosity of the innominate artery.   Result Date: 03/21/2020  Ost 1st Diag lesion is 30% stenosed.  Ost LAD to Prox LAD lesion is 10% stenosed.  Ost 2nd Diag lesion is 50% stenosed.  Mid LAD lesion is 40% stenosed.  Prox LAD lesion is 65% stenosed.  Prox Cx to Mid Cx lesion is 30% stenosed.  Ost RCA to Prox RCA lesion is 20% stenosed.  Ost RPDA to RPDA lesion is 20% stenosed.  Mid RCA lesion is 20% stenosed.  The left ventricular systolic function is normal.  LV end diastolic pressure is normal.  The left ventricular ejection fraction is 55-65% by visual estimate.  1.  Stable moderate one-vessel  coronary artery disease with 60 to 70% stenosis in the proximal right coronary artery which is eccentric and calcified at the origin of  the first diagonal.  The mid lesion seems to be better than before at 40%.  No evidence of obstructive disease. 2.  Normal LV systolic function normal left ventricular end-diastolic pressure. Recommendations: There might be slight progression in the proximal LAD disease but still not enough to cause unstable angina and symptoms at rest.  Recommend continuing medical therapy.  If the patient starts having anginal symptoms on a regular basis, PCI of the LAD can be considered but will likely require atherectomy or intravascular lithotripsy and protection of the first diagonal. Avoid catheterization via the left radial artery in the future given inability to get the wire into the ascending aorta.  In the past, catheterization via the right radial artery was possible but also was difficult due to tortuosity of the innominate artery.   Disposition   Pt is being discharged home today in good condition.  Follow-up Plans & Appointments     Follow-up Information     Schedule an appointment as soon as possible for a visit  with [place].   Contact information: [place] [phone]        Abigail Butts., PA-C Follow up on 04/06/2020.   Specialties: Physician Assistant, Cardiology Why: at 8:45am for your follow up appt. Contact information: [address] [place] [address] [phone]                Discharge Instructions     Diet - low sodium heart healthy   Complete by: As directed    Discharge instructions   Complete by: As directed    Radial Site Care Refer to this sheet in the next few weeks. These instructions provide you with information on caring for yourself after your procedure. Your caregiver may also give you more  specific instructions. Your treatment has been planned according to current medical practices, but problems sometimes occur. Call your caregiver if you have any problems or questions after your procedure. HOME CARE INSTRUCTIONS You may shower the day after the procedure. Remove the bandage (dressing) and gently wash the site with plain soap and water. Gently pat the site dry.  Do not apply powder or lotion to the site.  Do not submerge the affected site in water for 3 to 5 days.  Inspect the site at least twice daily.  Do not flex or bend the affected arm for 24 hours.  No lifting over 5 pounds (2.3 kg) for 5 days after your procedure.  Do not drive home if you are discharged the same day of the procedure. Have someone else drive you.  You may drive 24 hours after the procedure unless otherwise instructed by your caregiver.  What to expect: Any bruising will usually fade within 1 to 2 weeks.  Blood that collects in the tissue (hematoma) may be painful to the touch. It should usually decrease in size and tenderness within 1 to 2 weeks.  SEEK IMMEDIATE MEDICAL CARE IF: You have unusual pain at the radial site.  You have redness, warmth, swelling, or pain at the radial site.  You have drainage (other than a small amount of blood on the dressing).  You have chills.  You have a fever or persistent symptoms for more than 72 hours.  You have a fever and your symptoms suddenly get worse.  Your arm becomes pale, cool, tingly, or numb.  You have heavy bleeding from the site. Hold pressure on the site.  Increase activity slowly   Complete by: As directed        Discharge Medications   Allergies as of 03/22/2020       Reactions   Erythromycin Other (See Comments)   REACTION: stomach cramps   Erythromycin Other (See Comments)   Stomach cramps   Gabapentin    Causes blackouts   Gabapentin Other (See Comments)   Black out   Morphine Other (See Comments)   REACTION: Hallucinations    Morphine And Related Other (See Comments)   hallucinations   Penicillins Other (See Comments)   REACTION: unkkown, was a child   Penicillins Other (See Comments)   Reaction unknown Pt was a child   Relafen [nabumetone] Nausea Only        Medication List     TAKE these medications    acetaminophen 500 MG tablet Commonly known as: TYLENOL Take 500 mg by mouth every 6 (six) hours as needed for mild pain.   AeroChamber MV inhaler Use as instructed   albuterol 108 (90 Base) MCG/ACT inhaler Commonly known as: VENTOLIN HFA Inhale 1-2 puffs into the lungs every 6 (six) hours as needed for wheezing or shortness of breath.   atorvastatin 80 MG tablet Commonly known as: LIPITOR Take 1 tablet (80 mg total) by mouth every evening. What changed:  medication strength how much to take   budesonide-formoterol 80-4.5 MCG/ACT inhaler Commonly known as: SYMBICORT Inhale 2 puffs, then rinse mouth, twice daily maintenance What changed:  how much to take how to take this when to take this reasons to take this additional instructions   clotrimazole 1 % cream Commonly known as: LOTRIMIN Apply 1 application topically 2 (two) times daily as needed (skin irritation).   dexlansoprazole 60 MG capsule Commonly known as: DEXILANT Take 60 mg by mouth daily.   escitalopram 10 MG tablet Commonly known as: LEXAPRO Take 10 mg by mouth at bedtime.   ipratropium-albuterol 0.5-2.5 (3) MG/3ML Soln Commonly known as: DUONEB USE 1 VIAL IN NEBULIZER EVERY 6 HOURS AS NEEDED What changed: See the new instructions.   isosorbide mononitrate 60 MG 24 hr tablet Commonly known as: IMDUR Take 1 tablet (60 mg total) by mouth daily.   nitroGLYCERIN 0.4 MG SL tablet Commonly known as: NITROSTAT Place 1 tablet (0.4 mg total) under the tongue every 5 (five) minutes as needed.   rivaroxaban 20 MG Tabs tablet Commonly known as: XARELTO Take 20 mg by mouth daily with supper.   vitamin B-12 500 MCG  tablet Commonly known as: CYANOCOBALAMIN Take 500 mcg by mouth daily.   Vitamin D 50 MCG (2000 UT) Caps Take 4,000 Units by mouth daily.        Outstanding Labs/Studies   FLP/LFTs in 8 weeks  Duration of Discharge Encounter   Greater than 30 minutes including physician time.  Signed, Laverda Page, NP 03/22/2020, 9:24 AM  Agree with note by Laverda Page NP-C  Stable for DC home. S/P cor angio by Dr Kirke Corin. Anatomy w/o signif change. Went femoral because R/L radials complicated by signif tortuosity. Add ASA, increase Atorva. TOC 7 then ROV with Dr Cristal Deer.  Runell Gess, M.D., FACP, [facility], [facility] [address] [place], [place]  [address]  [phone] 03/22/2020 12:58 PM

## 2020-03-22 NOTE — Care Management Obs Status (Signed)
MEDICARE OBSERVATION STATUS NOTIFICATION   Patient Details  Name: Diane Barker MRN: [id number] Date of Birth: March 16, 1951   Medicare Observation Status Notification Given:  Yes    Manette Doto Aris Lot, LCSW 03/22/2020, 12:29 PM

## 2020-03-23 ENCOUNTER — Telehealth: Payer: 59 | Admitting: Physician Assistant

## 2020-04-06 ENCOUNTER — Ambulatory Visit: Payer: 59 | Admitting: Medical

## 2020-04-09 NOTE — Progress Notes (Signed)
Cardiology Office Note:    Date:  04/10/2020   ID:  Diane Barker, DOB 1951/03/04, MRN [id number]  PCP:  Shon Baton, MD  Cardiologist:  Elouise Munroe, MD   Referring MD: Shon Baton, MD   Chief Complaint  Patient presents with  . Follow-up     Chest pain    History of Present Illness:    Diane Barker is a 70 y.o. female with a hx of nonobstructive CAD, hypertension, hyperlipidemia, GERD, obesity, DVT, and bilateral PE (2015) on xarelto.  She had an abnormal CT coronary 01/16/2018 which led to a heart catheterization that showed nonobstructive disease in the LAD (60% proximal lesion), which was not significant by DFR and FFR.  There was no intervention at that time.  Echocardiogram at that time showed normal EF, grade 1 diastolic dysfunction, and trivial MR.  She did not tolerate Lopressor due to depressed mood.  She suffered 2 falls which resulted in a heart monitor that did not show any significant arrhythmias.  Symptoms were associated with sinus rhythm.  Is maintained on Xarelto for history of DVT and bilateral PE.  She was seen in clinic Sept 2021 for preop clearance and reported chest pain consistent with MSK pain. She was cleared with no further testing.  She presented to [facility] with chest pain on 03/20/20. Given her history, she underwent repeat heart catheterization 03/21/2020 that showed stable disease that includes 60 to 70% stenosis in the proximal LAD, and 30% stenosis in the proximal circumflex.  No intervention and medical therapy was recommended.  Chest pain felt to be noncardiac.  If she continues to have rec urrent angina at rest, PCI of the LAD could be considered but would require atherectomy or lithotripsy.  She presents for TOC follow up. She has had no further episodes of chest pain. She did have indigestion a few nights ago, but this was after eating. She states she is having DOE due to deconditioning. She admits to being very sedentary. I asked how she can increase her steps  and she said that she isn't interested. She does report that she and an overweight friend may start walking when the weather warms up. She does report that she is always cold. TSH last checked 12/2018. Recommended repeat labs with PCP. She does drink a lot of soda.   We had a long discussion about reducing soda intake and increasing activity. She is limited by knee pain. I have recommended 2000 mg sodium daily and 25 grams of sugar daily. She does not cook and primarily eats sandwiches - anything she can find (potato chip sandwich, mustard sandwich). We discussed activity and she was not interested in cardiac rehab. I have recommended the recumbent bike and elliptical - starting with 15 min only due to deconditioning and knee pain   Past Medical History:  Diagnosis Date  . Anxiety   . Arthritis    "all over my body" (01/15/2018)  . Breast cancer, right breast ([place]) 2000   Rt lumpectomy, Chemo/XRT/rt axillary node   . Bronchial asthma   . Chronic lower back pain   . Depression   . Diverticulosis   . DVT (deep venous thrombosis) ([place]) 06/2013   RLE  . GERD (gastroesophageal reflux disease)   . High cholesterol   . Hx of adenomatous colonic polyps   . Hypertension   . Obesity   . Osteoarthritis   . Personal history of chemotherapy   . Personal history of radiation therapy   .  Pulmonary embolism ([place]) 06/2013   "both lungs"  . Spondylolisthesis     Past Surgical History:  Procedure Laterality Date  . BREAST BIOPSY Right 2000  . BREAST LUMPECTOMY Right 2000   for CA txed w/ chemo and radiation, right  . CARDIAC CATHETERIZATION  03/21/2020  . HERNIA REPAIR    . INTRAVASCULAR PRESSURE WIRE/FFR STUDY N/A 01/18/2018   Procedure: INTRAVASCULAR PRESSURE WIRE/FFR STUDY;  Surgeon: Burnell Blanks, MD;  Location: [place] [facility];  Service: Cardiovascular;  Laterality: N/A;  . LEFT HEART CATH AND CORONARY ANGIOGRAPHY N/A 01/18/2018   Procedure: LEFT HEART CATH AND CORONARY  ANGIOGRAPHY;  Surgeon: Burnell Blanks, MD;  Location: [place] [facility];  Service: Cardiovascular;  Laterality: N/A;  . LEFT HEART CATH AND CORONARY ANGIOGRAPHY N/A 03/21/2020   Procedure: LEFT HEART CATH AND CORONARY ANGIOGRAPHY;  Surgeon: Wellington Hampshire, MD;  Location: [place] [facility];  Service: Cardiovascular;  Laterality: N/A;  . TUBAL LIGATION    . UMBILICAL HERNIA REPAIR  1983    Current Medications: Current Meds  Medication Sig  . acetaminophen (TYLENOL) 500 MG tablet Take 500 mg by mouth every 6 (six) hours as needed for mild pain.  Marland Kitchen albuterol (VENTOLIN HFA) 108 (90 Base) MCG/ACT inhaler Inhale 2 puffs into the lungs every 4 (four) hours as needed.  Marland Kitchen albuterol (VENTOLIN HFA) 108 (90 Base) MCG/ACT inhaler Inhale 2 puffs into the lungs every 6 (six) hours as needed. In hale 1-2 puffs into lungs every 6 (six) hours as needed for wheezing or shortness of breath  . atorvastatin (LIPITOR) 80 MG tablet Take 1 tablet (80 mg total) by mouth every evening.  . budesonide-formoterol (SYMBICORT) 80-4.5 MCG/ACT inhaler Inhale 2 puffs into the lungs 2 (two) times daily. Inhale 2 puffs,then rise mouth twice daily maintenance  . Cholecalciferol (VITAMIN D) 2000 UNITS CAPS Take 4,000 Units by mouth daily.   . clotrimazole (LOTRIMIN) 1 % cream Apply 1 application topically 2 (two) times daily as needed (skin irritation).  Marland Kitchen dexlansoprazole (DEXILANT) 60 MG capsule Take 60 mg by mouth daily.  Marland Kitchen escitalopram (LEXAPRO) 10 MG tablet Take 10 mg by mouth at bedtime.  . isosorbide mononitrate (IMDUR) 60 MG 24 hr tablet Take 1 tablet (60 mg total) by mouth daily.  . nitroGLYCERIN (NITROSTAT) 0.4 MG SL tablet Place 1 tablet (0.4 mg total) under the tongue every 5 (five) minutes as needed.  . rivaroxaban (XARELTO) 20 MG TABS tablet Take 20 mg by mouth daily with supper.  Marland Kitchen Spacer/Aero-Holding Chambers (AEROCHAMBER MV) inhaler Use as instructed  . vitamin B-12 (CYANOCOBALAMIN) 500 MCG tablet Take  500 mcg by mouth daily.     Allergies:   Cefdinir, Erythromycin, Erythromycin, Gabapentin, Gabapentin, Morphine, Morphine and related, Penicillins, Penicillins, and Relafen [nabumetone]   Social History   Socioeconomic History  . Marital status: Single    Spouse name: Not on file  . Number of children: 2  . Years of education: 10  . Highest education level: Not on file  Occupational History  . 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 Smoking status: Never Smoker  . Smokeless tobacco: Never Used  Vaping Use  . Vaping Use: Never used  Substance and Sexual Activity  . Alcohol use: Never    Alcohol/week: 0.0 standard drinks  . Drug use: Never  . Sexual activity: Not Currently  Other Topics Concern  . Not on file  Social History Narrative   ** Merged History Encounter **       Lives at home alone Caffeine use- soda 16-24 oz weekly   Social Determinants of Health   Financial Resource Strain: Not on file  Food Insecurity: Not on file  Transportation Needs: Not on file  Physical Activity: Not on file  Stress: Not on file  Social Connections: Not on file     Family History: The patient's family history includes Asthma in her daughter and mother; Breast cancer in her maternal aunt; COPD in her mother; Colon cancer in her paternal aunt; Diabetes in her brother, daughter, and mother; Heart attack in her brother and mother; Hypertension in her brother; Kidney failure in her brother; Lung cancer in her father.  ROS:   Please see the history of present illness.     All other systems reviewed and are negative  EKGs/Labs/Other Studies Reviewed:    The following studies were reviewed today:  Cath: 03/21/20   Ost 1st Diag lesion is 30% stenosed.  Ost LAD to Prox LAD lesion is 10% stenosed.  Ost 2nd Diag lesion is 50% stenosed.  Mid LAD lesion is 40% stenosed.  Prox LAD lesion is 65% stenosed.  Prox Cx to Mid Cx  lesion is 30% stenosed.  Ost RCA to Prox RCA lesion is 20% stenosed.  Ost RPDA to RPDA lesion is 20% stenosed.  Mid RCA lesion is 20% stenosed.  The left ventricular systolic function is normal.  LV end diastolic pressure is normal.  The left ventricular ejection fraction is 55-65% by visual estimate.  1. Stable moderate one-vessel coronary artery disease with 60 to 70% stenosis in the proximal right coronary artery which is eccentric and calcified at the origin of the first diagonal. The mid lesion seems to be better than before at 40%. No evidence of obstructive disease. 2. Normal LV systolic function normal left ventricular end-diastolic pressure.  Recommendations: There might be slight progression in the proximal LAD disease but still not enough to cause unstable angina and symptoms at rest. Recommend continuing medical therapy. If the patient starts having anginal symptoms on a regular basis, PCI of the LAD can be considered but will likely require atherectomy or intravascular lithotripsy and protection of the first diagonal. Repeat Xarelto tomorrow if no bleeding issues.  Avoid catheterization via the left radial artery in the future given inability to get the wire into the ascending aorta. In the past, catheterization via the right radial artery was possible but also was difficult due to tortuosity of the innominate artery.  EKG:  EKG is not ordered today.    Recent Labs: 08/31/2019: ALT 18 03/22/2020: BUN 20; Creatinine, Ser 1.11; Hemoglobin 13.1; Platelets 201; Potassium 3.6; Sodium 129  Recent Lipid Panel    Component Value Date/Time   CHOL 130 03/22/2020 0042   TRIG 58 03/22/2020 0042   HDL 46 03/22/2020 0042   CHOLHDL 2.8 03/22/2020 0042   VLDL 12 03/22/2020 0042   LDLCALC 72 03/22/2020 0042    Physical Exam:    VS:  BP 122/78   Pulse 84   Ht 5\' 5"  (1.651 m)   Wt 257 lb (116.6 kg)   SpO2 97%   BMI 42.77 kg/m     Wt Readings from Last 3 Encounters:   04/10/20 257 lb (116.6 kg)  03/20/20 255 lb (115.7 kg)  12/28/19 254 lb 6.4 oz (115.4 kg)     GEN: obese female in no acute distress HEENT: Normal NECK: No  JVD; No carotid bruits LYMPHATICS: No lymphadenopathy CARDIAC: RRR, no murmurs, rubs, gallops RESPIRATORY:  Clear to auscultation without rales, wheezing or rhonchi  ABDOMEN: Soft, non-tender, non-distended MUSCULOSKELETAL:  No edema; No deformity  SKIN: Warm and dry NEUROLOGIC:  Alert and oriented x 3 PSYCHIATRIC:  Normal affect  Left radial and right groin site C/D/I  ASSESSMENT:    1. Coronary artery disease involving native coronary artery of native heart without angina pectoris   2. Essential hypertension   3. Hyperlipidemia, unspecified hyperlipidemia type   4. History of DVT (deep vein thrombosis)   5. History of pulmonary embolus (PE)   6. Accident due to mechanical fall without injury, subsequent encounter   7. Morbid obesity ([place])    PLAN:    In order of problems listed above:  Nonobstructive CAD - no ASA in the setting of xarelto - no further chest pain   Hypertension - did not tolerate Lopressor in the past - Continue Imdur - pressure is well-controlled   Hyperlipidemia with LDL goal less than 70 03/22/2020: Cholesterol 130; HDL 46; LDL Cholesterol 72; Triglycerides 58; VLDL 12 - Lipitor was increased from 40 to 80 mg at discharge - repeat lipids in 1 year   History of bilateral PE in 2015 - Continue Xarelto - no bleeding problems   Left-sided weakness History of mechanical falls - Due to not lifting her left foot high enough, per patient   Obesity - discussed reducing soda intake, increasing activity, and sodium restriction   Keep follow up with Dr. Margaretann Loveless in April.   Medication Adjustments/Labs and Tests Ordered: Current medicines are reviewed at length with the patient today.  Concerns regarding medicines are outlined above.  No orders of the defined types were placed in this  encounter.  No orders of the defined types were placed in this encounter.   Signed, Ledora Bottcher, PA  04/10/2020 2:14 PM    [facility]

## 2020-04-10 ENCOUNTER — Other Ambulatory Visit: Payer: Self-pay

## 2020-04-10 ENCOUNTER — Encounter: Payer: Self-pay | Admitting: Physician Assistant

## 2020-04-10 ENCOUNTER — Ambulatory Visit (INDEPENDENT_AMBULATORY_CARE_PROVIDER_SITE_OTHER): Payer: 59 | Admitting: Physician Assistant

## 2020-04-10 VITALS — BP 122/78 | HR 84 | Ht 65.0 in | Wt 257.0 lb

## 2020-04-10 DIAGNOSIS — I2510 Atherosclerotic heart disease of native coronary artery without angina pectoris: Secondary | ICD-10-CM

## 2020-04-10 DIAGNOSIS — E785 Hyperlipidemia, unspecified: Secondary | ICD-10-CM | POA: Diagnosis not present

## 2020-04-10 DIAGNOSIS — Z86718 Personal history of other venous thrombosis and embolism: Secondary | ICD-10-CM | POA: Diagnosis not present

## 2020-04-10 DIAGNOSIS — I10 Essential (primary) hypertension: Secondary | ICD-10-CM

## 2020-04-10 DIAGNOSIS — Z86711 Personal history of pulmonary embolism: Secondary | ICD-10-CM

## 2020-04-10 DIAGNOSIS — W19XXXD Unspecified fall, subsequent encounter: Secondary | ICD-10-CM

## 2020-04-10 NOTE — Patient Instructions (Signed)
Medication Instructions:  No Changes *If you need a refill on your cardiac medications before your next appointment, please call your pharmacy*   Lab Work: No Labs If you have labs (blood work) drawn today and your tests are completely normal, you will receive your results only by: Marland Kitchen MyChart Message (if you have MyChart) OR . A paper copy in the mail If you have any lab test that is abnormal or we need to change your treatment, we will call you to review the results.   Testing/Procedures: No Testing   Follow-Up: At [facility], you and your health needs are our priority.  As part of our continuing mission to provide you with exceptional heart care, we have created designated Provider Care Teams.  These Care Teams include your primary Cardiologist (physician) and Advanced Practice Providers (APPs -  Physician Assistants and Nurse Practitioners) who all work together to provide you with the care you need, when you need it.  Your next appointment:   July 09, 2020 1:20pm  The format for your next appointment:   In Person  Provider:   Cherlynn Kaiser, MD   Other Instructions Maintain sodium intake below 2,000 mg and sugar intake below 25 grams. Increase activity more frequent walks. Can use stationary bike and elliptical only.

## 2020-04-11 ENCOUNTER — Encounter (HOSPITAL_COMMUNITY): Payer: Self-pay | Admitting: *Deleted

## 2020-04-11 ENCOUNTER — Other Ambulatory Visit: Payer: Self-pay

## 2020-04-11 ENCOUNTER — Ambulatory Visit (HOSPITAL_COMMUNITY)
Admission: EM | Admit: 2020-04-11 | Discharge: 2020-04-11 | Disposition: A | Discharge status: Home / Self Care | Payer: 59 | Attending: Emergency Medicine | Admitting: Emergency Medicine

## 2020-04-11 DIAGNOSIS — H6121 Impacted cerumen, right ear: Secondary | ICD-10-CM | POA: Diagnosis not present

## 2020-04-11 NOTE — ED Provider Notes (Signed)
HPI  SUBJECTIVE:  Diane Barker is a 70 y.o. female who presents with 4 days of "my right ear bothering me".  She reports constant pain today.  She reports significantly decreased hearing.  No fevers, nasal congestion, rhinorrhea, allergy symptoms.  No otorrhea.  Denies foreign body insertion.  No sore throat.  Ear pain is not associated with chewing or yawning.  She does not grind her teeth.  No antibiotics in the past month.  No antipyretic in the past 6 hours.  No aggravating or alleviating factors.  She has not tried anything for this.  She has a past medical history of coronary artery disease status post catheterization 1/5, hypertension, DVT, bilateral PE on Xarelto, hypercholesterolemia.  She states that she gets ear infections once a year.  No history of TMJ arthralgia.  JXB:JYNWG, Jenny Reichmann, MD     Past Medical History:  Diagnosis Date  . Anxiety   . Arthritis    "all over my body" (01/15/2018)  . Breast cancer, right breast ([place]) 2000   Rt lumpectomy, Chemo/XRT/rt axillary node   . Bronchial asthma   . Chronic lower back pain   . Depression   . Diverticulosis   . DVT (deep venous thrombosis) ([place]) 06/2013   RLE  . GERD (gastroesophageal reflux disease)   . High cholesterol   . Hx of adenomatous colonic polyps   . Hypertension   . Obesity   . Osteoarthritis   . Personal history of chemotherapy   . Personal history of radiation therapy   . Pulmonary embolism ([place]) 06/2013   "both lungs"  . Spondylolisthesis     Past Surgical History:  Procedure Laterality Date  . BREAST BIOPSY Right 2000  . BREAST LUMPECTOMY Right 2000   for CA txed w/ chemo and radiation, right  . CARDIAC CATHETERIZATION  03/21/2020  . HERNIA REPAIR    . INTRAVASCULAR PRESSURE WIRE/FFR STUDY N/A 01/18/2018   Procedure: INTRAVASCULAR PRESSURE WIRE/FFR STUDY;  Surgeon: Burnell Blanks, MD;  Location: [place] [facility];  Service: Cardiovascular;  Laterality: N/A;  . LEFT HEART CATH AND CORONARY  ANGIOGRAPHY N/A 01/18/2018   Procedure: LEFT HEART CATH AND CORONARY ANGIOGRAPHY;  Surgeon: Burnell Blanks, MD;  Location: [place] [facility];  Service: Cardiovascular;  Laterality: N/A;  . LEFT HEART CATH AND CORONARY ANGIOGRAPHY N/A 03/21/2020   Procedure: LEFT HEART CATH AND CORONARY ANGIOGRAPHY;  Surgeon: Wellington Hampshire, MD;  Location: [place] [facility];  Service: Cardiovascular;  Laterality: N/A;  . TUBAL LIGATION    . UMBILICAL HERNIA REPAIR  1983    Family History  Problem Relation Age of Onset  . Asthma Mother   . COPD Mother   . Heart attack Mother   . Diabetes Mother   . Lung cancer Father   . Asthma Daughter   . Kidney failure Brother   . Diabetes Brother        x 2  . Heart attack Brother   . Breast cancer Maternal Aunt   . Colon cancer Paternal Aunt   . Diabetes Daughter        x 2  . Hypertension Brother     Social History   Tobacco Use  . Smoking status: Never Smoker  . Smokeless tobacco: Never Used  Vaping Use  . Vaping Use: Never used  Substance Use Topics  . Alcohol use: Never    Alcohol/week: 0.0 standard drinks  . Drug use: Never    No current facility-administered medications  for this encounter.  Current Outpatient Medications:  .  acetaminophen (TYLENOL) 500 MG tablet, Take 500 mg by mouth every 6 (six) hours as needed for mild pain., Disp: , Rfl:  .  albuterol (VENTOLIN HFA) 108 (90 Base) MCG/ACT inhaler, Inhale 2 puffs into the lungs every 4 (four) hours as needed., Disp: , Rfl:  .  albuterol (VENTOLIN HFA) 108 (90 Base) MCG/ACT inhaler, Inhale 2 puffs into the lungs every 6 (six) hours as needed. In hale 1-2 puffs into lungs every 6 (six) hours as needed for wheezing or shortness of breath, Disp: , Rfl:  .  atorvastatin (LIPITOR) 80 MG tablet, Take 1 tablet (80 mg total) by mouth every evening., Disp: 30 tablet, Rfl: 1 .  budesonide-formoterol (SYMBICORT) 80-4.5 MCG/ACT inhaler, Inhale 2 puffs into the lungs 2 (two) times daily. Inhale  2 puffs,then rise mouth twice daily maintenance, Disp: , Rfl:  .  Cholecalciferol (VITAMIN D) 2000 UNITS CAPS, Take 4,000 Units by mouth daily. , Disp: , Rfl:  .  clotrimazole (LOTRIMIN) 1 % cream, Apply 1 application topically 2 (two) times daily as needed (skin irritation)., Disp: , Rfl:  .  dexlansoprazole (DEXILANT) 60 MG capsule, Take 60 mg by mouth daily., Disp: , Rfl:  .  escitalopram (LEXAPRO) 10 MG tablet, Take 10 mg by mouth at bedtime., Disp: , Rfl:  .  isosorbide mononitrate (IMDUR) 60 MG 24 hr tablet, Take 1 tablet (60 mg total) by mouth daily., Disp: 90 tablet, Rfl: 3 .  nitroGLYCERIN (NITROSTAT) 0.4 MG SL tablet, Place 1 tablet (0.4 mg total) under the tongue every 5 (five) minutes as needed., Disp: 25 tablet, Rfl: 3 .  rivaroxaban (XARELTO) 20 MG TABS tablet, Take 20 mg by mouth daily with supper., Disp: , Rfl:  .  Spacer/Aero-Holding Chambers (AEROCHAMBER MV) inhaler, Use as instructed, Disp: 1 each, Rfl: 0 .  vitamin B-12 (CYANOCOBALAMIN) 500 MCG tablet, Take 500 mcg by mouth daily., Disp: , Rfl:   Allergies  Allergen Reactions  . Cefdinir     Other reaction(s): itching !  . Erythromycin Other (See Comments)    REACTION: stomach cramps  . Erythromycin Other (See Comments)    Stomach cramps  . Gabapentin     Causes blackouts  . Gabapentin Other (See Comments)    Black out  . Morphine Other (See Comments)    REACTION: Hallucinations  . Morphine And Related Other (See Comments)    hallucinations  . Penicillins Other (See Comments)    REACTION: unkkown, was a child  . Penicillins Other (See Comments)    Reaction unknown Pt was a child  . Relafen [Nabumetone] Nausea Only     ROS  As noted in HPI.   Physical Exam  BP 120/62 (BP Location: Right Arm)   Pulse 74   Temp 98.4 F (36.9 C) (Oral)   Resp 18   SpO2 99%   Constitutional: Well developed, well nourished, no acute distress Eyes:  EOMI, conjunctiva normal bilaterally HENT: Right external ear normal.  No pain with palpation of tragus, traction on pinna. No tenderness over the mastoid. Right TM obscured with cerumen. Decreased hearing. No TMJ tenderness, crepitus.  Left TM partially obscured cerumen, normal. Respiratory: Normal inspiratory effort Cardiovascular: Normal rate GI: nondistended skin: No rash, skin intact Musculoskeletal: no deformities Neurologic: Alert & oriented x 3, no focal neuro deficits Psychiatric: Speech and behavior appropriate   ED Course   Medications - No data to display  No orders of the defined types  were placed in this encounter.   No results found for this or any previous visit (from the past 24 hour(s)). No results found.  ED Clinical Impression  1. Impacted cerumen of right ear      ED Assessment/Plan  Procedure note: Curetted out a large plug of earwax with improvement in hearing. Right TM intact, no evidence of infection. Right external ear canal erythematous, but not swollen. No abrasion in the ear canal.  Patient with cerumen impaction. No evidence of otitis media, externa, mastoiditis, TMJ arthralgia. we will have her do Debrox and hydrogen peroxide/warm water to prevent further buildup.  Discussed  MDM, treatment plan, and plan for follow-up with patient. patient agrees with plan.   No orders of the defined types were placed in this encounter.   *This clinic note was created using Dragon dictation software. Therefore, there may be occasional mistakes despite careful proofreading.   ?    Melynda Ripple, MD 04/11/20 [phone]

## 2020-04-11 NOTE — Discharge Instructions (Addendum)
Debrox, half hydrogen peroxide/warm water irrigation to help prevent buildup.

## 2020-04-11 NOTE — ED Triage Notes (Signed)
Pt presents today with Bil. Ear pain worse on Rt side for 1 week.

## 2020-04-13 ENCOUNTER — Other Ambulatory Visit: Payer: Self-pay | Admitting: Cardiology

## 2020-04-16 ENCOUNTER — Other Ambulatory Visit: Payer: Self-pay | Admitting: Internal Medicine

## 2020-04-16 DIAGNOSIS — Z1231 Encounter for screening mammogram for malignant neoplasm of breast: Secondary | ICD-10-CM

## 2020-04-26 ENCOUNTER — Other Ambulatory Visit: Payer: Self-pay | Admitting: Internal Medicine

## 2020-04-26 DIAGNOSIS — I2510 Atherosclerotic heart disease of native coronary artery without angina pectoris: Secondary | ICD-10-CM

## 2020-04-26 DIAGNOSIS — Z79899 Other long term (current) drug therapy: Secondary | ICD-10-CM

## 2020-04-26 DIAGNOSIS — I208 Other forms of angina pectoris: Secondary | ICD-10-CM

## 2020-05-10 ENCOUNTER — Telehealth: Payer: Self-pay | Admitting: Internal Medicine

## 2020-05-10 DIAGNOSIS — E785 Hyperlipidemia, unspecified: Secondary | ICD-10-CM

## 2020-05-10 NOTE — Telephone Encounter (Signed)
Called and scheduled pt in lipid clinic

## 2020-05-10 NOTE — Telephone Encounter (Signed)
Returned the call to the patient. She stated that she has had mild symptoms of joint pain and constipation while being on Atorvastatin 40 mg once daily. While in the hospital in January this was increased to 80 mg and now her symptoms have gotten worse.  She has discontinued the medication for the past several days and these symptoms have gotten better. She stated that she will take the atorvastatin again but at a smaller dose if possible.

## 2020-05-10 NOTE — Telephone Encounter (Signed)
Pt c/o medication issue:  1. Name of Medication:  Atorvastatin*  2. How are you currently taking this medication (dosage and times per day)? 1 time a day  3. Are you having a reaction (difficulty breathing--STAT)? Yes and wheezing  4. What is your medication issue?  Constipation, joint pain, terrible cough

## 2020-05-10 NOTE — Telephone Encounter (Signed)
Spoke with Dr. Margaretann Loveless who advised that patient can decrease dosage to 40mg  for now but would like for patient to follow up with [facility] to discuss Lipid Management since patient is not tolerating statins.   Spoke with patient who is agreeable to this and would like to see [facility] for Lipid Management. Advised patient I would place referral and send message to our PharmD's here in the office to get patient an appointment. Advised patient to call back to office with any issues, questions, or concerns. Patient verbalized understanding.

## 2020-05-10 NOTE — Telephone Encounter (Signed)
Okay to resume atorvastatin 40mg  daily. Repeat fasting lipid panel in 4 weeks, and schedule visit with pharmacist Lipid Clinic in 5 week to discuss results and other therapeutic options to keep LDL well controlled.

## 2020-05-29 ENCOUNTER — Ambulatory Visit
Admission: RE | Admit: 2020-05-29 | Discharge: 2020-05-29 | Disposition: A | Discharge status: Home / Self Care | Payer: 59 | Source: Ambulatory Visit | Attending: Internal Medicine | Admitting: Internal Medicine

## 2020-05-29 ENCOUNTER — Other Ambulatory Visit: Payer: Self-pay

## 2020-05-29 DIAGNOSIS — Z1231 Encounter for screening mammogram for malignant neoplasm of breast: Secondary | ICD-10-CM

## 2020-06-07 ENCOUNTER — Ambulatory Visit: Payer: 59

## 2020-06-15 ENCOUNTER — Telehealth: Payer: Self-pay | Admitting: Internal Medicine

## 2020-06-15 MED ORDER — OSELTAMIVIR PHOSPHATE 75 MG PO CAPS: 75.0000 mg | ORAL_CAPSULE | Freq: Two times a day (BID) | ORAL | 0 refills | Status: DC

## 2020-06-15 MED ORDER — PREDNISONE 10 MG PO TABS: ORAL_TABLET | ORAL | 0 refills | Status: AC

## 2020-06-15 MED ORDER — AZITHROMYCIN 250 MG PO TABS: ORAL_TABLET | ORAL | 0 refills | Status: DC

## 2020-06-15 NOTE — Telephone Encounter (Signed)
Called and spoke with pt letting her know the info stated by Dr. Annamaria Boots and let her know that we were going to send Rx for tamiflu, prednisone, and zpak to pharmacy for her. Also stated to her the other recs per CY. Pt verbalized understanding. Verified preferred pharmacy and sent meds in for pt. Nothing further needed.

## 2020-06-15 NOTE — Telephone Encounter (Signed)
Her daughter has Influenza A.  Recommend we send: 1) Tamiflu 75 mg, # 14, 1 twice daily 2) Zpak   250 mg, # 6,  2 today then one daily    instead of the sulfa drug which is causing diarrhea 3) prednisone 10 mg, # 20  4 X 2 DAYS, 3 X 2 DAYS, 2 X 2 DAYS, 1 X 2 DAYS  Ok to use otc cough med like Delsym or Robitussin DM  Ok to use Pepto Bismol in between med doses, if needed to get stomach settled down.

## 2020-06-15 NOTE — Telephone Encounter (Signed)
Called and spoke with pt who stated she has had a cough x3 days. Pt said that she has also had chills; believes that she has had a temp but does not really know as she does not have a thermometer at home that she can take her temp with.  Along with the cough, pt said that she has also been wheezing. Due to the cough, pt has not been sleeping well at night.  Pt has no appetite and is also weak. Pt states that she has a lot of mouth dryness.  Pt has tried OTC meds which she thinks is Mucinex DM and also was prescribed Bactrim DS by PCP on 3/30 but pt said since beginning she has been having diarrhea.  Asked pt if she has had her covid vaccines and she said that she has not had them nor does she plan on getting them.  Pt said that she has done 2 home covid tests which came back negative but due to her symptoms, I stated to her that she needed to get a Covid PCR test done and she verbalized understanding. Pt also wants more recommendations. Dr. Annamaria Boots, please advise.  Allergies  Allergen Reactions  . Cefdinir     Other reaction(s): itching !  . Erythromycin Other (See Comments)    REACTION: stomach cramps  . Erythromycin Other (See Comments)    Stomach cramps  . Gabapentin     Causes blackouts  . Gabapentin Other (See Comments)    Black out  . Morphine Other (See Comments)    REACTION: Hallucinations  . Morphine And Related Other (See Comments)    hallucinations  . Penicillins Other (See Comments)    REACTION: unkkown, was a child  . Penicillins Other (See Comments)    Reaction unknown Pt was a child  . Relafen [Nabumetone] Nausea Only     Current Outpatient Medications:  .  acetaminophen (TYLENOL) 500 MG tablet, Take 500 mg by mouth every 6 (six) hours as needed for mild pain., Disp: , Rfl:  .  albuterol (VENTOLIN HFA) 108 (90 Base) MCG/ACT inhaler, Inhale 2 puffs into the lungs every 4 (four) hours as needed., Disp: , Rfl:  .  albuterol (VENTOLIN HFA) 108 (90 Base) MCG/ACT  inhaler, Inhale 2 puffs into the lungs every 6 (six) hours as needed. In hale 1-2 puffs into lungs every 6 (six) hours as needed for wheezing or shortness of breath, Disp: , Rfl:  .  atorvastatin (LIPITOR) 80 MG tablet, TAKE 1 TABLET BY MOUTH EVERY EVENING, Disp: 90 tablet, Rfl: 3 .  budesonide-formoterol (SYMBICORT) 80-4.5 MCG/ACT inhaler, Inhale 2 puffs into the lungs 2 (two) times daily. Inhale 2 puffs,then rise mouth twice daily maintenance, Disp: , Rfl:  .  Cholecalciferol (VITAMIN D) 2000 UNITS CAPS, Take 4,000 Units by mouth daily. , Disp: , Rfl:  .  clotrimazole (LOTRIMIN) 1 % cream, Apply 1 application topically 2 (two) times daily as needed (skin irritation)., Disp: , Rfl:  .  dexlansoprazole (DEXILANT) 60 MG capsule, Take 60 mg by mouth daily., Disp: , Rfl:  .  escitalopram (LEXAPRO) 10 MG tablet, Take 10 mg by mouth at bedtime., Disp: , Rfl:  .  isosorbide mononitrate (IMDUR) 60 MG 24 hr tablet, Take 1 tablet (60 mg total) by mouth daily., Disp: 90 tablet, Rfl: 3 .  nitroGLYCERIN (NITROSTAT) 0.4 MG SL tablet, Place 1 tablet (0.4 mg total) under the tongue every 5 (five) minutes as needed., Disp: 25 tablet, Rfl: 3 .  rivaroxaban (  XARELTO) 20 MG TABS tablet, Take 20 mg by mouth daily with supper., Disp: , Rfl:  .  Spacer/Aero-Holding Chambers (AEROCHAMBER MV) inhaler, Use as instructed, Disp: 1 each, Rfl: 0 .  sulfamethoxazole-trimethoprim (BACTRIM DS) 800-160 MG tablet, Take 1 tablet by mouth 2 (two) times daily., Disp: , Rfl:  .  vitamin B-12 (CYANOCOBALAMIN) 500 MCG tablet, Take 500 mcg by mouth daily., Disp: , Rfl:

## 2020-06-19 ENCOUNTER — Telehealth: Payer: Self-pay | Admitting: Internal Medicine

## 2020-06-19 NOTE — Telephone Encounter (Signed)
Called spoke with patient. She has already been given medication by Dr. Annamaria Boots for this cough and she wants to see him for an appointment with a chest xray. I scheduled patient for Friday at 0930  Nothing further needed at this time.

## 2020-06-21 NOTE — Progress Notes (Signed)
Patient ID: Diane Barker, female    DOB: July 10, 1950, 70 y.o.   MRN: [id number]  HPI F never smoker followed for bronchitis with hx rhinitis, GERD, hx breast cancer, Hx DVT/ PE/ Xarelto. Office Spirometry 04/30/2015- WNL- FVC 2.75/87%, FEV1 2.29/93%, FEV1/FVC 0.83, FEF 25-75 percent 2.83/126%. PFT 01/08/2018- insignificant response to dilator, mild restriction, diffusion mildly reduced.  FVC 2.52/78%, FEV1 2.21/90%, ratio 1.88, FEF 25-75% 3.30/259%, TLC 75%, DLCO 63% ------------------------------------------------------------------------------   09/01/19- 70 year old female never smoker followed for bronchitis, rhinitis, complicated by GERD, history breast cancer/XRT, history DVT/PE/Xarelto, Covid infection November 2020,  PFT today Proair, [employer], Symbicort 160- not using ED last night for flank pain- suspected kidney stone. Wait was too long and she left. Much better now, and today she thinks it was muscle spasm from sleeping on unfamiliar hard mattress. Had Covid infection in November, not needing hosp. Has put off getting Covax and was strongly encouraged to go ahead. No bleeding on Xarelto  06/22/20- 70 year old female never smoker followed for bronchitis, rhinitis, complicated by GERD, history breast cancer/XRT, history DVT/PE/Xarelto, Covid infection November 2020, CAD,  -Ventolin hfa, Neb Duoneb, Symbicort 160- not using Prednisone taper 06/15/20, ZPAK, Tamiflu   For reported probable Influenza A (confirmed in family). Covid vx- Flu vax- -----Pt states that she does not feel any better after recent meds that were prescribed (incl Tamiflu, prednisone). States she has increased SOB, coughing, and some wheezing. States that she has not had a temp recently. Twice Neg Covid test.  Covid vax-none Asks [facility] parking. Sore throat,diarrhea. Finished Zpak and almost done prednisone. Cough now dry. Tessalon no help. CXR 10/10/19-1V- No active cardiopulmonary disease.  No interval  change.  Review of Systems-See HPI   + = positive Constitutional:   No-   weight loss, night sweats, fevers, chills, fatigue, lassitude. HEENT:   +  headaches, No-difficulty swallowing, tooth/dental problems, +sore throat,        sneezing, itching, ear ache, nasal congestion, +post nasal drip,  CV:  +atypical chest pain, orthopnea, PND, swelling in lower extremities, anasarca,  dizziness, palpitations Resp: +shortness of breath with exertion or at rest.              + productive cough,  + non-productive cough,  No- coughing up of blood.           change in color of mucus.  No- wheezing.   Skin: No-   rash or lesions. GI:  +  heartburn, indigestion, abdominal pain, +nausea, vomiting,  GU: MS:  No-   joint pain or swelling, + R flank pain Neuro-     nothing unusual Psych:  No- change in mood or affect. No depression or anxiety.  No memory loss.  Objective:  OBJ- Physical Exam General- Alert, Oriented, Affect-appropriate/ calm, Distress- none acute, + obese Skin- rash-none, lesions- none, excoriation+  Lymphadenopathy- none Head- atraumatic            Eyes- Gross vision intact, PERRLA, conjunctivae and secretions clear            Ears- Hearing, canals-normal            Nose- clear, no-Septal dev, mucus, polyps, erosion, perforation             Throat- Mallampati IV , mucosa clear , drainage- none, tonsils- atrophic,  Neck- flexible , trachea midline, no stridor , thyroid nl, carotid no bruit Chest - symmetrical excursion , unlabored           Heart/CV-  RRR , no murmur , no gallop  , no rub, nl s1 s2                           - JVD- none , edema-none, stasis changes- none, varices- none           Lung- + clear, unlabored, wheeze- none, cough+dry, dullness-none, rub- none           Chest wall-  Abd-  Br/ Gen/ Rectal- Not done, not indicated Extrem- cyanosis- none, clubbing, none, atrophy- none, strength- nl, + excoriated stasis dermatitis Neuro- grossly intact to  observation

## 2020-06-22 ENCOUNTER — Ambulatory Visit (INDEPENDENT_AMBULATORY_CARE_PROVIDER_SITE_OTHER): Payer: 59 | Admitting: Internal Medicine

## 2020-06-22 ENCOUNTER — Other Ambulatory Visit: Payer: Self-pay

## 2020-06-22 ENCOUNTER — Ambulatory Visit (INDEPENDENT_AMBULATORY_CARE_PROVIDER_SITE_OTHER): Payer: 59

## 2020-06-22 ENCOUNTER — Encounter: Payer: Self-pay | Admitting: Internal Medicine

## 2020-06-22 VITALS — BP 124/70 | HR 60 | Temp 97.2°F | Ht 65.0 in | Wt 253.2 lb

## 2020-06-22 DIAGNOSIS — J4531 Mild persistent asthma with (acute) exacerbation: Secondary | ICD-10-CM | POA: Diagnosis not present

## 2020-06-22 DIAGNOSIS — J449 Chronic obstructive pulmonary disease, unspecified: Secondary | ICD-10-CM

## 2020-06-22 DIAGNOSIS — I2782 Chronic pulmonary embolism: Secondary | ICD-10-CM | POA: Diagnosis not present

## 2020-06-22 DIAGNOSIS — I2609 Other pulmonary embolism with acute cor pulmonale: Secondary | ICD-10-CM | POA: Diagnosis not present

## 2020-06-22 MED ORDER — PROMETHAZINE-CODEINE 6.25-10 MG/5ML PO SYRP: 5.0000 mL | ORAL_SOLUTION | Freq: Four times a day (QID) | ORAL | 0 refills | Status: DC | PRN

## 2020-06-22 MED ORDER — BREZTRI AEROSPHERE 160-9-4.8 MCG/ACT IN AERO: 2.0000 | INHALATION_SPRAY | Freq: Two times a day (BID) | RESPIRATORY_TRACT | 0 refills | Status: DC

## 2020-06-22 NOTE — Patient Instructions (Signed)
Order- Sample x 2 Breztri inhaler     Inhale 2 puffs then rinse mouth, twice daily. Try this instead of Symbicort for now.   Order- CXR   Dx exacerbation chronic bronchitis  Script sent for codeine cough syrup  Stay well- hydrated  So you can cough the mucus out of your chest  Please call as needed

## 2020-06-27 ENCOUNTER — Ambulatory Visit: Payer: 59

## 2020-06-29 ENCOUNTER — Encounter: Payer: Self-pay | Admitting: *Deleted

## 2020-07-09 ENCOUNTER — Other Ambulatory Visit: Payer: Self-pay

## 2020-07-09 ENCOUNTER — Encounter: Payer: Self-pay | Admitting: Internal Medicine

## 2020-07-09 ENCOUNTER — Ambulatory Visit (INDEPENDENT_AMBULATORY_CARE_PROVIDER_SITE_OTHER): Payer: 59 | Admitting: Internal Medicine

## 2020-07-09 VITALS — BP 130/73 | HR 72 | Ht 65.0 in | Wt 256.2 lb

## 2020-07-09 DIAGNOSIS — I2510 Atherosclerotic heart disease of native coronary artery without angina pectoris: Secondary | ICD-10-CM

## 2020-07-09 DIAGNOSIS — I10 Essential (primary) hypertension: Secondary | ICD-10-CM

## 2020-07-09 DIAGNOSIS — Z86711 Personal history of pulmonary embolism: Secondary | ICD-10-CM

## 2020-07-09 DIAGNOSIS — E785 Hyperlipidemia, unspecified: Secondary | ICD-10-CM | POA: Diagnosis not present

## 2020-07-09 DIAGNOSIS — Z86718 Personal history of other venous thrombosis and embolism: Secondary | ICD-10-CM

## 2020-07-09 DIAGNOSIS — Z79899 Other long term (current) drug therapy: Secondary | ICD-10-CM

## 2020-07-09 MED ORDER — ATORVASTATIN CALCIUM 40 MG PO TABS: 40.0000 mg | ORAL_TABLET | Freq: Every day | ORAL | 3 refills | Status: DC

## 2020-07-09 NOTE — Progress Notes (Signed)
Cardiology Office Note:    Date:  07/09/2020   ID:  Diane Barker, DOB 29-Jul-1950, MRN [id number]  PCP:  Shon Baton, MD  Cardiologist:  Elouise Munroe, MD  Electrophysiologist:  None   Referring MD: Shon Baton, MD   Chief Complaint/Reason for Referral: CAD  History of Present Illness:    Diane Barker is a 70 y.o. female with a history of coronary artery disease with stable angina.    Today, she presents with a persistent cough. Using her inhaler will provoke her cough. Her cough is non-productive but she states "it feels loose."  Denies any chest pain or pressure. No orthopnea, PND, or LE edema.   She was hospitalized in early January for chest pain with a known proximal LAD lesion.  She was felt to have stable moderate one-vessel coronary artery disease with 60 to 70% stenosis of the proximal LAD.  Felt to have slight progression of disease but not felt to be enough to cause unstable angina.  Focus was on medical management of CAD.  She has had no interval chest pain.  She takes atorvastatin around 6 pm daily, and notices she gets very fatigued and doesn't want to wake up in the morning. She feels the dose may be too high and asks to lower it.  This dose was increased at the time of her hospitalization.    Her daughter checked her sugar to see if this was a diabetic symptom and it was 202 at that time.  She feels this was secondary to steroid use for pulmonary disease.  We discussed PCSK9 inhibitor for managing her cholesterol. She is not willing to take any medications that involve using a needle on a daily basis. She seems agreeable if it is done every few weeks instead of every day.  She does not recall having a positive test for COVID-19 infection, but did test at home. She had a negative at home test on 06/12/20.  Past Medical History:  Diagnosis Date  . Anxiety   . Arthritis    "all over my body" (01/15/2018)  . Breast cancer, right breast ([place]) 2000   Rt lumpectomy,  Chemo/XRT/rt axillary node   . Bronchial asthma   . Chronic lower back pain   . Depression   . Diverticulosis   . DVT (deep venous thrombosis) ([place]) 06/2013   RLE  . GERD (gastroesophageal reflux disease)   . High cholesterol   . Hx of adenomatous colonic polyps   . Hypertension   . Obesity   . Osteoarthritis   . Personal history of chemotherapy   . Personal history of radiation therapy   . Pulmonary embolism ([place]) 06/2013   "both lungs"  . Spondylolisthesis     Past Surgical History:  Procedure Laterality Date  . BREAST BIOPSY Right 2000  . BREAST LUMPECTOMY Right 2000   for CA txed w/ chemo and radiation, right  . CARDIAC CATHETERIZATION  03/21/2020  . HERNIA REPAIR    . INTRAVASCULAR PRESSURE WIRE/FFR STUDY N/A 01/18/2018   Procedure: INTRAVASCULAR PRESSURE WIRE/FFR STUDY;  Surgeon: Burnell Blanks, MD;  Location: [facility];  Service: Cardiovascular;  Laterality: N/A;  . LEFT HEART CATH AND CORONARY ANGIOGRAPHY N/A 01/18/2018   Procedure: LEFT HEART CATH AND CORONARY ANGIOGRAPHY;  Surgeon: Burnell Blanks, MD;  Location: [facility];  Service: Cardiovascular;  Laterality: N/A;  . LEFT HEART CATH AND CORONARY ANGIOGRAPHY N/A 03/21/2020   Procedure: LEFT HEART CATH AND CORONARY  ANGIOGRAPHY;  Surgeon: Wellington Hampshire, MD;  Location: [facility];  Service: Cardiovascular;  Laterality: N/A;  . TUBAL LIGATION    . UMBILICAL HERNIA REPAIR  1983    Current Medications: Current Meds  Medication Sig  . acetaminophen (TYLENOL) 500 MG tablet Take 500 mg by mouth every 6 (six) hours as needed for mild pain.  Marland Kitchen albuterol (VENTOLIN HFA) 108 (90 Base) MCG/ACT inhaler Inhale 2 puffs into the lungs every 6 (six) hours as needed. In hale 1-2 puffs into lungs every 6 (six) hours as needed for wheezing or shortness of breath  . atorvastatin (LIPITOR) 40 MG tablet Take 1 tablet (40 mg total) by mouth daily.  . Budeson-Glycopyrrol-Formoterol (BREZTRI  AEROSPHERE) 160-9-4.8 MCG/ACT AERO Inhale 2 puffs into the lungs in the morning and at bedtime.  . budesonide-formoterol (SYMBICORT) 80-4.5 MCG/ACT inhaler Inhale 2 puffs into the lungs 2 (two) times daily. Inhale 2 puffs,then rise mouth twice daily maintenance  . Cholecalciferol (VITAMIN D) 2000 UNITS CAPS Take 4,000 Units by mouth daily.   . clotrimazole (LOTRIMIN) 1 % cream Apply 1 application topically 2 (two) times daily as needed (skin irritation).  Marland Kitchen dexlansoprazole (DEXILANT) 60 MG capsule Take 60 mg by mouth daily.  Marland Kitchen escitalopram (LEXAPRO) 10 MG tablet Take 10 mg by mouth at bedtime.  . isosorbide mononitrate (IMDUR) 60 MG 24 hr tablet Take 1 tablet (60 mg total) by mouth daily.  . nitroGLYCERIN (NITROSTAT) 0.4 MG SL tablet Place 1 tablet (0.4 mg total) under the tongue every 5 (five) minutes as needed.  . promethazine-codeine (PHENERGAN WITH CODEINE) 6.25-10 MG/5ML syrup Take 5 mLs by mouth every 6 (six) hours as needed for cough.  . rivaroxaban (XARELTO) 20 MG TABS tablet Take 20 mg by mouth daily with supper.  Marland Kitchen Spacer/Aero-Holding Chambers (AEROCHAMBER MV) inhaler Use as instructed  . vitamin B-12 (CYANOCOBALAMIN) 500 MCG tablet Take 500 mcg by mouth daily.  . [DISCONTINUED] atorvastatin (LIPITOR) 80 MG tablet TAKE 1 TABLET BY MOUTH EVERY EVENING     Allergies:   Cefdinir, Erythromycin, Erythromycin, Gabapentin, Gabapentin, Morphine, Morphine and related, Penicillins, Penicillins, and Relafen [nabumetone]   Social History   Tobacco Use  . Smoking status: Never Smoker  . Smokeless tobacco: Never Used  Vaping Use  . Vaping Use: Never used  Substance Use Topics  . Alcohol use: Never    Alcohol/week: 0.0 standard drinks  . Drug use: Never     Family History: The patient's family history includes Asthma in her daughter and mother; Breast cancer in her maternal aunt; COPD in her mother; Colon cancer in her paternal aunt; Diabetes in her brother, daughter, and mother; Heart  attack in her brother and mother; Hypertension in her brother; Kidney failure in her brother; Lung cancer in her father.  ROS:   Please see the history of present illness.    All other systems reviewed and are negative.  EKGs/Labs/Other Studies Reviewed:    The following studies were reviewed today:  EKG:  12/28/2019: NSR, possible LVH 07/09/2020: NSR, incomplete RBBB, non-specific t waves abnormality in the lateral leads, Rate: 72 bpm  I have independently reviewed the images from cardiac catheterization performed 03/21/2020.  Recent Labs: 08/31/2019: ALT 18 03/22/2020: BUN 20; Creatinine, Ser 1.11; Hemoglobin 13.1; Platelets 201; Potassium 3.6; Sodium 129  Recent Lipid Panel    Component Value Date/Time   CHOL 130 03/22/2020 0042   TRIG 58 03/22/2020 0042   HDL 46 03/22/2020 0042   CHOLHDL 2.8 03/22/2020 0042  VLDL 12 03/22/2020 0042   LDLCALC 72 03/22/2020 0042    Physical Exam:    VS:  BP 130/73   Pulse 72   Ht 5\' 5"  (1.651 m)   Wt 256 lb 3.2 oz (116.2 kg)   SpO2 95%   BMI 42.63 kg/m     Wt Readings from Last 5 Encounters:  07/09/20 256 lb 3.2 oz (116.2 kg)  06/22/20 253 lb 3.2 oz (114.9 kg)  04/10/20 257 lb (116.6 kg)  03/20/20 255 lb (115.7 kg)  12/28/19 254 lb 6.4 oz (115.4 kg)    Constitutional: No acute distress Eyes: sclera non-icteric, normal conjunctiva and lids ENMT: normal dentition, moist mucous membranes Cardiovascular: regular rhythm, normal rate, no murmurs. S1 and S2 normal. Radial pulses normal bilaterally. No jugular venous distention.  Respiratory: clear to auscultation bilaterally GI : normal bowel sounds, soft and nontender. No distention.   MSK: extremities warm, well perfused. No edema.  NEURO: grossly nonfocal exam, moves all extremities. PSYCH: alert and oriented x 3, normal mood and affect.   ASSESSMENT:    1. Hyperlipidemia, unspecified hyperlipidemia type   2. Coronary artery disease involving native coronary artery of native heart  without angina pectoris   3. Essential hypertension   4. History of DVT (deep vein thrombosis)   5. History of pulmonary embolus (PE)   6. Medication management    PLAN:    CAD Hyperlipidemia  Hypertension  -she has a moderate to severe lesion in the proximal LAD which was felt to be mostly stable on the last cardiac catheterization in January for an admission for chest pain.  No intervention performed and medical management recommended.  Continue Xarelto, atorvastatin though we will decrease the dose of the atorvastatin given her feeling that she has significant fatigue on the 80 mg dose.  We will reduce to 40 mg daily.  Continue isosorbide mononitrate 60 mg daily.  Patient has previously been intolerant to beta-blockers.  For statin intolerance, will also place a referral for [facility] lipid clinic.  History of DVT and PE-continue Xarelto.  No bleeding events.  Syncope-no interval syncope since last visit.   Cherlynn Kaiser, MD, [facility]   Medication Adjustments/Labs and Tests Ordered: Current medicines are reviewed at length with the patient today.  Concerns regarding medicines are outlined above.   Orders Placed This Encounter  Procedures  . EKG 12-Lead    Meds ordered this encounter  Medications  . atorvastatin (LIPITOR) 40 MG tablet    Sig: Take 1 tablet (40 mg total) by mouth daily.    Dispense:  90 tablet    Refill:  3    Patient Instructions  Medication Instructions:  START: ATORVASTATIN: 40mg  DAILY *If you need a refill on your cardiac medications before your next appointment, please call your pharmacy*  Follow-Up: At [facility] you and your health needs are our priority.  As part of our continuing mission to provide you with exceptional heart care, we have created designated Provider Care Teams.  These Care Teams include your primary Cardiologist (physician) and Advanced Practice Providers (APPs -  Physician Assistants and Nurse  Practitioners) who all work together to provide you with the care you need, when you need it.  Your next appointment:   6 month(s)  The format for your next appointment:   In Person  Provider:   Cherlynn Kaiser, MD  Other Instructions PLEASE FOLLOW UP IN [facility] (PHARMD) FOR CHOLESTEROL MANAGEMENT NEXT AVAILABLE  I,Mathew Stumpf,acting as a Education administrator for Elouise Munroe, MD.,have documented all relevant documentation on the behalf of Elouise Munroe, MD,as directed by  Elouise Munroe, MD while in the presence of Elouise Munroe, MD.   I, Elouise Munroe, MD, have reviewed all documentation for this visit. The documentation on 07/09/20 for the exam, diagnosis, procedures, and orders are all accurate and complete.

## 2020-07-09 NOTE — Patient Instructions (Signed)
Medication Instructions:  START: ATORVASTATIN: 40mg  DAILY *If you need a refill on your cardiac medications before your next appointment, please call your pharmacy*  Follow-Up: At [facility], you and your health needs are our priority.  As part of our continuing mission to provide you with exceptional heart care, we have created designated Provider Care Teams.  These Care Teams include your primary Cardiologist (physician) and Advanced Practice Providers (APPs -  Physician Assistants and Nurse Practitioners) who all work together to provide you with the care you need, when you need it.  Your next appointment:   6 month(s)  The format for your next appointment:   In Person  Provider:   Cherlynn Kaiser, MD  Other Instructions PLEASE FOLLOW UP IN [facility] (PHARMD) FOR CHOLESTEROL MANAGEMENT NEXT AVAILABLE

## 2020-07-10 ENCOUNTER — Telehealth: Payer: Self-pay | Admitting: Internal Medicine

## 2020-07-10 DIAGNOSIS — I2510 Atherosclerotic heart disease of native coronary artery without angina pectoris: Secondary | ICD-10-CM

## 2020-07-10 DIAGNOSIS — Z79899 Other long term (current) drug therapy: Secondary | ICD-10-CM

## 2020-07-10 DIAGNOSIS — I208 Other forms of angina pectoris: Secondary | ICD-10-CM

## 2020-07-10 DIAGNOSIS — I2089 Other forms of angina pectoris: Secondary | ICD-10-CM

## 2020-07-10 MED ORDER — ISOSORBIDE MONONITRATE ER 60 MG PO TB24: 60.0000 mg | ORAL_TABLET | Freq: Every day | ORAL | 3 refills | Status: DC

## 2020-07-10 NOTE — Telephone Encounter (Signed)
*  STAT* If patient is at the pharmacy, call can be transferred to refill team.   1. Which medications need to be refilled? (please list name of each medication and dose if known)  isosorbide mononitrate (IMDUR) 60 MG 24 hr tablet  2. Which pharmacy/location (including street and city if local pharmacy) is medication to be sent to? [id number] - [place], [place] - [address]  3. Do they need a 30 day or 90 day supply?  90 day supply  

## 2020-07-18 ENCOUNTER — Telehealth: Payer: Self-pay | Admitting: Internal Medicine

## 2020-07-18 NOTE — Telephone Encounter (Signed)
disregard pt has found form

## 2020-08-01 ENCOUNTER — Ambulatory Visit (INDEPENDENT_AMBULATORY_CARE_PROVIDER_SITE_OTHER): Payer: 59 | Admitting: Pharmacist Clinician (PhC)/ Clinical Pharmacy Specialist

## 2020-08-01 ENCOUNTER — Other Ambulatory Visit: Payer: Self-pay

## 2020-08-01 DIAGNOSIS — E785 Hyperlipidemia, unspecified: Secondary | ICD-10-CM | POA: Diagnosis not present

## 2020-08-01 MED ORDER — REPATHA 140 MG/ML ~~LOC~~ SOSY: 140.0000 mg | PREFILLED_SYRINGE | SUBCUTANEOUS | 0 refills | Status: DC

## 2020-08-01 NOTE — Assessment & Plan Note (Signed)
Patient with ASCVD and hyperlipidemia, on maximally tolerated statin (atorvastatin 40 mg).  Was not able to tolerate 80 mg dose.  Reviewed options for lowering LDL cholesterol, including ezetimibe, PCSK-9 inhibitors, bempedoic acid and inclisiran.  Discussed mechanisms of action, dosing, side effects and potential decreases in LDL cholesterol.  Answered all patient questions.  Based on this information, patient would prefer to start Repatha SureClick 903 mg.  Patient was leery of needles, so we gave sample dose in office today.  Patient was able to self-administer without any problems.  Will get approval thru insurance and have patient repeat labs in 2-3 months.

## 2020-08-01 NOTE — Progress Notes (Signed)
08/01/2020 Diane Barker [birth year] [id number]   HPI:  Diane Barker is a 70 y.o. female patient of Dr Margaretann Loveless, who presents today for a lipid clinic evaluation.  See pertinent past medical history below.  She is in the office today to discuss options for further cholesterol lowering, as she is unable to tolerate maximum dose atorvastatin and thus is not at LDL goal.    Past Medical History: ASCVD Prox LAD with 65% stenosis, Ost 2nd diagonal 50% stenosed  DVT Right lower extremity, both lungs 2015  hypertension Controlled - currently no medications  Breast cancer Right lumpectomy with chemo, radiation    Current Medications: atorvastatin 40 mg  Cholesterol Goals: LDL < 70   Intolerant/previously tried: atorvastatin 80 mg caused fatigue, myalgias  Family history: doesn't know father history; mother with MI, died at 6 d/t heart issues; 2 half brothers 1 with stent, now 87 with dementia; other died from kidney failure/DM2; 2 daughters, not sure if have cholesterol issues, both DM (one DM1 dx around 12-14, other DM2)  Diet: previously ate out regularly, recently has had poor appetite, eating only one meal most days; today had bread with mustard; does like salads and has been eating more of those lately;   Exercise:  Occasionally walks dog in yard, but causes SOB; did not do cardiac rehab  Labs: 1/22: TC 130, TG 58, HDL 46, LDL 72   Current Outpatient Medications  Medication Sig Dispense Refill  . acetaminophen (TYLENOL) 500 MG tablet Take 500 mg by mouth every 6 (six) hours as needed for mild pain.    Marland Kitchen albuterol (VENTOLIN HFA) 108 (90 Base) MCG/ACT inhaler Inhale 2 puffs into the lungs every 6 (six) hours as needed. In hale 1-2 puffs into lungs every 6 (six) hours as needed for wheezing or shortness of breath    . atorvastatin (LIPITOR) 40 MG tablet Take 1 tablet (40 mg total) by mouth daily. 90 tablet 3  . Budeson-Glycopyrrol-Formoterol (BREZTRI AEROSPHERE) 160-9-4.8 MCG/ACT AERO Inhale 2  puffs into the lungs in the morning and at bedtime. 5.9 g 0  . budesonide-formoterol (SYMBICORT) 80-4.5 MCG/ACT inhaler Inhale 2 puffs into the lungs 2 (two) times daily. Inhale 2 puffs,then rise mouth twice daily maintenance    . Cholecalciferol (VITAMIN D) 2000 UNITS CAPS Take 4,000 Units by mouth daily.     . clotrimazole (LOTRIMIN) 1 % cream Apply 1 application topically 2 (two) times daily as needed (skin irritation).    Marland Kitchen dexlansoprazole (DEXILANT) 60 MG capsule Take 60 mg by mouth daily.    Marland Kitchen docusate sodium (COLACE) 100 MG capsule Take 100 mg by mouth 2 (two) times daily.    Marland Kitchen escitalopram (LEXAPRO) 10 MG tablet Take 10 mg by mouth at bedtime.    . Evolocumab (REPATHA) 140 MG/ML SOSY Inject 140 mg into the skin every 14 (fourteen) days. 1 mL 0  . Fluconazole (DIFLUCAN PO) Take by mouth.    . isosorbide mononitrate (IMDUR) 60 MG 24 hr tablet Take 60 mg by mouth daily.    . nitroGLYCERIN (NITROSTAT) 0.4 MG SL tablet Place 1 tablet (0.4 mg total) under the tongue every 5 (five) minutes as needed. 25 tablet 3  . promethazine-codeine (PHENERGAN WITH CODEINE) 6.25-10 MG/5ML syrup Take 5 mLs by mouth every 6 (six) hours as needed for cough. 200 mL 0  . rivaroxaban (XARELTO) 20 MG TABS tablet Take 20 mg by mouth daily with supper.    Marland Kitchen Spacer/Aero-Holding Chambers (AEROCHAMBER MV) inhaler Use as instructed  1 each 0  . vitamin B-12 (CYANOCOBALAMIN) 500 MCG tablet Take 500 mcg by mouth daily.     No current facility-administered medications for this visit.    Allergies  Allergen Reactions  . Cefdinir     Other reaction(s): itching !  . Erythromycin Other (See Comments)    REACTION: stomach cramps  . Erythromycin Other (See Comments)    Stomach cramps  . Gabapentin     Causes blackouts  . Gabapentin Other (See Comments)    Black out  . Lipitor [Atorvastatin]     High doses cause fatigued myalgias  . Morphine Other (See Comments)    REACTION: Hallucinations  . Morphine And Related  Other (See Comments)    hallucinations  . Penicillins Other (See Comments)    REACTION: unkkown, was a child  . Penicillins Other (See Comments)    Reaction unknown Pt was a child  . Relafen [Nabumetone] Nausea Only    Past Medical History:  Diagnosis Date  . Anxiety   . Arthritis    "all over my body" (01/15/2018)  . Breast cancer, right breast ([place]) 2000   Rt lumpectomy, Chemo/XRT/rt axillary node   . Bronchial asthma   . Chronic lower back pain   . Depression   . Diverticulosis   . DVT (deep venous thrombosis) ([place]) 06/2013   RLE  . GERD (gastroesophageal reflux disease)   . High cholesterol   . Hx of adenomatous colonic polyps   . Hypertension   . Obesity   . Osteoarthritis   . Personal history of chemotherapy   . Personal history of radiation therapy   . Pulmonary embolism ([place]) 06/2013   "both lungs"  . Spondylolisthesis     Blood pressure 126/82, pulse 80, resp. rate 17, height 5\' 5"  (1.651 m), weight 248 lb (112.5 kg), SpO2 92 %.   Hyperlipidemia Patient with ASCVD and hyperlipidemia, on maximally tolerated statin (atorvastatin 40 mg).  Was not able to tolerate 80 mg dose.  Reviewed options for lowering LDL cholesterol, including ezetimibe, PCSK-9 inhibitors, bempedoic acid and inclisiran.  Discussed mechanisms of action, dosing, side effects and potential decreases in LDL cholesterol.  Answered all patient questions.  Based on this information, patient would prefer to start Repatha SureClick 211 mg.  Patient was leery of needles, so we gave sample dose in office today.  Patient was able to self-administer without any problems.  Will get approval thru insurance and have patient repeat labs in 2-3 months.     Tommy Medal PharmD CPP [facility] [address] [place], [place] [address] [phone]

## 2020-08-01 NOTE — Patient Instructions (Addendum)
Your Results:             Your most recent labs Goal  Total Cholesterol 130 < 200  Triglycerides 58 < 150  HDL (happy/good cholesterol) 46 > 40  LDL (lousy/bad cholesterol 72 < 70     Medication changes:  Start Repatha 140 mg every 2 weeks.   Take medication out of the refrigerator at least 30 minutes before giving yourself a dose.  Next dose is due on or about June 1  Lab orders:  We will send you a notice to repeat labs after 2-3 months of use.    Patient Assistance:  The [employer] foundation offers assistance to help pay for medication copays.  They will cover copays for all cholesterol lowering meds, including statins, fibrates, omega-3 oils, ezetimibe, Repatha, Praluent, Nexletol, Nexlizet.  The cards are usually good for $2,500 or 12 months, whichever comes first. 1. Go to healthwellfoundation.org 2. Click on "Apply Now" 3. Answer questions as to whom is applying (patient or representative) 4. Your disease fund will be "hypercholesterolemia - Medicare access" 5. They will ask questions about finances and which medications you are taking for cholesterol 6. When you submit, the approval is usually within minutes.  You will need to print the card information from the site 7. You will need to show this information to your pharmacy, they will bill your Medicare Part D plan first -then bill [employer] --for the copay.   You can also call them at [phone], although the hold times can be quite long.   Thank you for choosing [facility]    Cholesterol Content in Foods Cholesterol is a waxy, fat-like substance that helps to carry fat in the blood. The body needs cholesterol in small amounts, but too much cholesterol can cause damage to the arteries and heart. Most people should eat less than 200 milligrams (mg) of cholesterol a day. Foods with cholesterol Cholesterol is found in animal-based foods, such as meat, seafood, and dairy. Generally, low-fat dairy and lean meats have  less cholesterol than full-fat dairy and fatty meats. The milligrams of cholesterol per serving (mg per serving) of common cholesterol-containing foods are listed below. Meat and other proteins  Egg -- one large whole egg has 186 mg.  Veal shank -- 4 oz has 141 mg.  Lean ground [place] (93% lean) -- 4 oz has 118 mg.  Fat-trimmed lamb loin -- 4 oz has 106 mg.  Lean ground beef (90% lean) -- 4 oz has 100 mg.  Lobster -- 3.5 oz has 90 mg.  Pork loin chops -- 4 oz has 86 mg.  Canned salmon -- 3.5 oz has 83 mg.  Fat-trimmed beef top loin -- 4 oz has 78 mg.  Frankfurter -- 1 frank (3.5 oz) has 77 mg.  Crab -- 3.5 oz has 71 mg.  Roasted chicken without skin, white meat -- 4 oz has 66 mg.  Light bologna -- 2 oz has 45 mg.  Deli-cut [place] -- 2 oz has 31 mg.  Canned tuna -- 3.5 oz has 31 mg.  Berniece Salines -- 1 oz has 29 mg.  Oysters and mussels (raw) -- 3.5 oz has 25 mg.  Mackerel -- 1 oz has 22 mg.  Trout -- 1 oz has 20 mg.  Pork sausage -- 1 link (1 oz) has 17 mg.  Salmon -- 1 oz has 16 mg.  Tilapia -- 1 oz has 14 mg. Dairy  Soft-serve ice cream --  cup (4 oz) has  103 mg.  Whole-milk yogurt -- 1 cup (8 oz) has 29 mg.  Cheddar cheese -- 1 oz has 28 mg.  American cheese -- 1 oz has 28 mg.  Whole milk -- 1 cup (8 oz) has 23 mg.  2% milk -- 1 cup (8 oz) has 18 mg.  Cream cheese -- 1 tablespoon (Tbsp) has 15 mg.  Cottage cheese --  cup (4 oz) has 14 mg.  Low-fat (1%) milk -- 1 cup (8 oz) has 10 mg.  Sour cream -- 1 Tbsp has 8.5 mg.  Low-fat yogurt -- 1 cup (8 oz) has 8 mg.  Nonfat Greek yogurt -- 1 cup (8 oz) has 7 mg.  Half-and-half cream -- 1 Tbsp has 5 mg. Fats and oils  Cod liver oil -- 1 tablespoon (Tbsp) has 82 mg.  Butter -- 1 Tbsp has 15 mg.  Lard -- 1 Tbsp has 14 mg.  Bacon grease -- 1 Tbsp has 14 mg.  Mayonnaise -- 1 Tbsp has 5-10 mg.  Margarine -- 1 Tbsp has 3-10 mg. Exact amounts of cholesterol in these foods may vary depending on  specific ingredients and brands.   Foods without cholesterol Most plant-based foods do not have cholesterol unless you combine them with a food that has cholesterol. Foods without cholesterol include:  Grains and cereals.  Vegetables.  Fruits.  Vegetable oils, such as olive, canola, and sunflower oil.  Legumes, such as peas, beans, and lentils.  Nuts and seeds.  Egg whites.   Summary  The body needs cholesterol in small amounts, but too much cholesterol can cause damage to the arteries and heart.  Most people should eat less than 200 milligrams (mg) of cholesterol a day. This information is not intended to replace advice given to you by your health care provider. Make sure you discuss any questions you have with your health care provider. Document Revised: 07/25/2019 Document Reviewed: 07/25/2019 Elsevier Patient Education  [place].

## 2020-08-02 ENCOUNTER — Other Ambulatory Visit: Payer: Self-pay

## 2020-08-02 MED ORDER — REPATHA SURECLICK 140 MG/ML ~~LOC~~ SOAJ: 140.0000 mg | SUBCUTANEOUS | 11 refills | Status: DC

## 2020-08-06 ENCOUNTER — Telehealth: Payer: Self-pay

## 2020-08-06 NOTE — Assessment & Plan Note (Signed)
Despite negative tests, we have to suspect Covid. She understands need to self-quarantine. Plan- CXR, prometh codeine cough syrup, sample breztri, Flutter valve

## 2020-08-06 NOTE — Telephone Encounter (Signed)
Called and spoke with pt regarding approval of repatha, rx sent and pt voiced understanding to complete fating lipids after 4th dose

## 2020-08-06 NOTE — Assessment & Plan Note (Signed)
Continues Xarelto No bleeding and no suspicion of recurrence currently

## 2020-08-07 ENCOUNTER — Ambulatory Visit (INDEPENDENT_AMBULATORY_CARE_PROVIDER_SITE_OTHER): Payer: 59 | Admitting: Podiatry

## 2020-08-07 ENCOUNTER — Other Ambulatory Visit: Payer: Self-pay

## 2020-08-07 DIAGNOSIS — Z7901 Long term (current) use of anticoagulants: Secondary | ICD-10-CM | POA: Diagnosis not present

## 2020-08-07 DIAGNOSIS — B351 Tinea unguium: Secondary | ICD-10-CM | POA: Diagnosis not present

## 2020-08-07 DIAGNOSIS — B353 Tinea pedis: Secondary | ICD-10-CM | POA: Diagnosis not present

## 2020-08-07 MED ORDER — CLOTRIMAZOLE-BETAMETHASONE 1-0.05 % EX CREA: 1.0000 "application " | TOPICAL_CREAM | Freq: Two times a day (BID) | CUTANEOUS | 0 refills | Status: DC

## 2020-08-13 NOTE — Progress Notes (Signed)
Subjective:   Patient ID: Diane Barker, female   DOB: 70 y.o.   MRN: [id number]   HPI 70 year old female presents the office today for concerns of nails becoming discolored and also she has a skin rash on her feet which causes itching.  She denies any open sores or any drainage.  She had no recent treatment.  She has no other concerns today.   Review of Systems  All other systems reviewed and are negative.  Past Medical History:  Diagnosis Date  . Anxiety   . Arthritis    "all over my body" (01/15/2018)  . Breast cancer, right breast ([place]) 2000   Rt lumpectomy, Chemo/XRT/rt axillary node   . Bronchial asthma   . Chronic lower back pain   . Depression   . Diverticulosis   . DVT (deep venous thrombosis) ([place]) 06/2013   RLE  . GERD (gastroesophageal reflux disease)   . High cholesterol   . Hx of adenomatous colonic polyps   . Hypertension   . Obesity   . Osteoarthritis   . Personal history of chemotherapy   . Personal history of radiation therapy   . Pulmonary embolism ([place]) 06/2013   "both lungs"  . Spondylolisthesis     Past Surgical History:  Procedure Laterality Date  . BREAST BIOPSY Right 2000  . BREAST LUMPECTOMY Right 2000   for CA txed w/ chemo and radiation, right  . CARDIAC CATHETERIZATION  03/21/2020  . HERNIA REPAIR    . INTRAVASCULAR PRESSURE WIRE/FFR STUDY N/A 01/18/2018   Procedure: INTRAVASCULAR PRESSURE WIRE/FFR STUDY;  Surgeon: Burnell Blanks, MD;  Location: [facility];  Service: Cardiovascular;  Laterality: N/A;  . LEFT HEART CATH AND CORONARY ANGIOGRAPHY N/A 01/18/2018   Procedure: LEFT HEART CATH AND CORONARY ANGIOGRAPHY;  Surgeon: Burnell Blanks, MD;  Location: [facility];  Service: Cardiovascular;  Laterality: N/A;  . LEFT HEART CATH AND CORONARY ANGIOGRAPHY N/A 03/21/2020   Procedure: LEFT HEART CATH AND CORONARY ANGIOGRAPHY;  Surgeon: Wellington Hampshire, MD;  Location: [facility];  Service: Cardiovascular;   Laterality: N/A;  . TUBAL LIGATION    . UMBILICAL HERNIA REPAIR  1983     Current Outpatient Medications:  .  clotrimazole-betamethasone (LOTRISONE) cream, Apply 1 application topically 2 (two) times daily., Disp: 30 g, Rfl: 0 .  Evolocumab (REPATHA SURECLICK) 993 MG/ML SOAJ, Inject 140 mg into the skin every 14 (fourteen) days., Disp: 2 mL, Rfl: 11 .  acetaminophen (TYLENOL) 500 MG tablet, Take 500 mg by mouth every 6 (six) hours as needed for mild pain., Disp: , Rfl:  .  albuterol (VENTOLIN HFA) 108 (90 Base) MCG/ACT inhaler, Inhale 2 puffs into the lungs every 6 (six) hours as needed. In hale 1-2 puffs into lungs every 6 (six) hours as needed for wheezing or shortness of breath, Disp: , Rfl:  .  atorvastatin (LIPITOR) 40 MG tablet, Take 1 tablet (40 mg total) by mouth daily., Disp: 90 tablet, Rfl: 3 .  Budeson-Glycopyrrol-Formoterol (BREZTRI AEROSPHERE) 160-9-4.8 MCG/ACT AERO, Inhale 2 puffs into the lungs in the morning and at bedtime., Disp: 5.9 g, Rfl: 0 .  budesonide-formoterol (SYMBICORT) 80-4.5 MCG/ACT inhaler, Inhale 2 puffs into the lungs 2 (two) times daily. Inhale 2 puffs,then rise mouth twice daily maintenance, Disp: , Rfl:  .  Cholecalciferol (VITAMIN D) 2000 UNITS CAPS, Take 4,000 Units by mouth daily. , Disp: , Rfl:  .  clotrimazole (LOTRIMIN) 1 % cream, Apply 1 application topically 2 (  two) times daily as needed (skin irritation)., Disp: , Rfl:  .  dexlansoprazole (DEXILANT) 60 MG capsule, Take 60 mg by mouth daily., Disp: , Rfl:  .  docusate sodium (COLACE) 100 MG capsule, Take 100 mg by mouth 2 (two) times daily., Disp: , Rfl:  .  escitalopram (LEXAPRO) 10 MG tablet, Take 10 mg by mouth at bedtime., Disp: , Rfl:  .  Fluconazole (DIFLUCAN PO), Take by mouth., Disp: , Rfl:  .  isosorbide mononitrate (IMDUR) 60 MG 24 hr tablet, Take 60 mg by mouth daily., Disp: , Rfl:  .  nitroGLYCERIN (NITROSTAT) 0.4 MG SL tablet, Place 1 tablet (0.4 mg total) under the tongue every 5 (five)  minutes as needed., Disp: 25 tablet, Rfl: 3 .  promethazine-codeine (PHENERGAN WITH CODEINE) 6.25-10 MG/5ML syrup, Take 5 mLs by mouth every 6 (six) hours as needed for cough., Disp: 200 mL, Rfl: 0 .  rivaroxaban (XARELTO) 20 MG TABS tablet, Take 20 mg by mouth daily with supper., Disp: , Rfl:  .  Spacer/Aero-Holding Chambers (AEROCHAMBER MV) inhaler, Use as instructed, Disp: 1 each, Rfl: 0 .  vitamin B-12 (CYANOCOBALAMIN) 500 MCG tablet, Take 500 mcg by mouth daily., Disp: , Rfl:   Allergies  Allergen Reactions  . Cefdinir     Other reaction(s): itching !  . Erythromycin Other (See Comments)    REACTION: stomach cramps  . Erythromycin Other (See Comments)    Stomach cramps  . Gabapentin     Causes blackouts  . Gabapentin Other (See Comments)    Black out  . Lipitor [Atorvastatin]     High doses cause fatigued myalgias  . Morphine Other (See Comments)    REACTION: Hallucinations  . Morphine And Related Other (See Comments)    hallucinations  . Penicillins Other (See Comments)    REACTION: unkkown, was a child  . Penicillins Other (See Comments)    Reaction unknown Pt was a child  . Relafen [Nabumetone] Nausea Only         Objective:  Physical Exam  General: AAO x3, NAD  Dermatological: Nails are hypertrophic, dystrophic with yellow-brown discoloration.  No pain in the nails.  There is dry, peeling, erythematous skin interdigitally as well as the plantar aspect of foot likely fungal infection.  There is no open lesions, pustules or drainage.  Vascular: Dorsalis Pedis artery and Posterior Tibial artery pedal pulses are 2/4 bilateral with immedate capillary fill time. There is no pain with calf compression, swelling, warmth, erythema.   Neruologic: Grossly intact via light touch bilateral.   Musculoskeletal: No gross boney pedal deformities bilateral. No pain, crepitus, or limitation noted with foot and ankle range of motion bilateral. Muscular strength 5/5 in all groups  tested bilateral.  Gait: Unassisted, Nonantalgic.       Assessment:   Onychomycosis, tinea pedis     Plan:  -Treatment options discussed including all alternatives, risks, and complications -Etiology of symptoms were discussed -I sharply debrided the nails with any complications or bleeding x10.-The sample for nail culture. -Prescription ketoconazole for the skin. -Discussed external measures including changing shoes and socks regularly as well as other ways to help prevent fungus.  Trula Slade DPM

## 2020-08-27 ENCOUNTER — Telehealth: Payer: Self-pay | Admitting: Internal Medicine

## 2020-08-27 ENCOUNTER — Encounter: Payer: Self-pay | Admitting: Podiatry

## 2020-08-27 NOTE — Telephone Encounter (Signed)
Returned call to patient.  She states she has been extremely tired for the past couple of weeks, since her last dose of Repatha.  Finally stopped taking atorvastatin 2 days ago and states is feeling better already.  Also, note that she had Covid again last week.  Advised that she hold atorvastatin x 1 week until feeling better, then restart it at just twice weekly.  Patient agreeable with plan.

## 2020-08-27 NOTE — Telephone Encounter (Signed)
    Pt c/o medication issue:  1. Name of Medication: \Evolocumab (REPATHA SURECLICK) 471 MG/ML SOAJ  2. How are you currently taking this medication (dosage and times per day)? Inject 140 mg into the skin every 14 (fourteen) days.  3. Are you having a reaction (difficulty breathing--STAT)?   4. What is your medication issue? Pt said she feels like this meds making her tired all the time, all she wants to do is sleep. She wanted to speak with PharmD Erasmo Downer or [place] Silos about it

## 2020-08-30 ENCOUNTER — Ambulatory Visit: Payer: 59 | Admitting: Internal Medicine

## 2020-09-14 ENCOUNTER — Telehealth: Payer: Self-pay | Admitting: Pharmacist

## 2020-09-14 NOTE — Telephone Encounter (Signed)
Patient called and reported she injected her Repatha on Wednesday and has felt sick since then and does not want to take anymore.  Reports she has a fever and chills. PCP told her she may have Covid but she doesn't believe them.  She took an at home Covid test which was positive. However, patient still doesn't think she has Covid, she thinks it is the [place] and wants to stop taking it.  She has three more injections left and I advised her to hold on to them in case she wanted to restart when she feels better but she declined.

## 2020-09-19 NOTE — Telephone Encounter (Signed)
Patient reports that she has felt lousy with 2nd and 3rd doses of Repatha, not willing to take any more.  Declined alternative medications, states she will consider trying something else at a later date.

## 2020-09-26 ENCOUNTER — Telehealth: Payer: Self-pay | Admitting: *Deleted

## 2020-09-26 ENCOUNTER — Telehealth: Payer: Self-pay | Admitting: Podiatry

## 2020-09-26 NOTE — Telephone Encounter (Signed)
Patient is requesting a call back in regards to nail culture results. Please advise.

## 2020-09-26 NOTE — Telephone Encounter (Signed)
Called and spoke with the patient and stated that Dr Jacqualyn Posey had sent a My Chart note into the patient's my chart and patient stated that she does not have it and or can not get into it and I stated that I would speak with Dr Jacqualyn Posey and call patient back. Diane Barker

## 2020-09-27 ENCOUNTER — Telehealth: Payer: Self-pay | Admitting: *Deleted

## 2020-09-27 ENCOUNTER — Other Ambulatory Visit: Payer: Self-pay | Admitting: Podiatry

## 2020-09-27 MED ORDER — CICLOPIROX 8 % EX SOLN: Freq: Every day | CUTANEOUS | 0 refills | Status: DC

## 2020-09-27 NOTE — Telephone Encounter (Signed)
Returned call to patient giving  lab results per doctor. She verbalized understanding results and would like to try the topical but wants to know if insurance will cover and what are the side effects from the topical before starting. Please advise.

## 2020-09-27 NOTE — Telephone Encounter (Signed)
I called and spoke with her the other day and she does not have my chart. Diane Barker

## 2020-09-28 NOTE — Telephone Encounter (Signed)
Called and relayed the message per Dr Jacqualyn Posey and the patient stated that she did not want to try anything that would have side effects and would just keep trimming the toenails and I stated that we also can do the compound pharmacy the nail lacquer and patient declined and that she would call us if she had any concerns or questions.

## 2020-09-28 NOTE — Telephone Encounter (Signed)
Returned call to patient, stated that she received doctor's message and prescription.

## 2020-10-13 ENCOUNTER — Other Ambulatory Visit: Payer: Self-pay | Admitting: Internal Medicine

## 2020-11-27 ENCOUNTER — Ambulatory Visit (INDEPENDENT_AMBULATORY_CARE_PROVIDER_SITE_OTHER): Payer: Medicaid Other | Admitting: Family Medicine

## 2020-12-01 ENCOUNTER — Ambulatory Visit (HOSPITAL_COMMUNITY): Payer: 59

## 2020-12-11 ENCOUNTER — Ambulatory Visit: Payer: Medicaid Other | Admitting: Podiatry

## 2020-12-11 ENCOUNTER — Ambulatory Visit (INDEPENDENT_AMBULATORY_CARE_PROVIDER_SITE_OTHER): Payer: 59 | Admitting: Family Medicine

## 2021-01-15 ENCOUNTER — Encounter: Payer: Self-pay | Admitting: Internal Medicine

## 2021-01-15 ENCOUNTER — Ambulatory Visit (INDEPENDENT_AMBULATORY_CARE_PROVIDER_SITE_OTHER): Payer: 59 | Admitting: Internal Medicine

## 2021-01-15 ENCOUNTER — Other Ambulatory Visit: Payer: Self-pay

## 2021-01-15 VITALS — BP 114/72 | HR 65 | Ht 65.5 in | Wt 240.2 lb

## 2021-01-15 DIAGNOSIS — Z86711 Personal history of pulmonary embolism: Secondary | ICD-10-CM

## 2021-01-15 DIAGNOSIS — E785 Hyperlipidemia, unspecified: Secondary | ICD-10-CM | POA: Diagnosis not present

## 2021-01-15 DIAGNOSIS — Z79899 Other long term (current) drug therapy: Secondary | ICD-10-CM

## 2021-01-15 DIAGNOSIS — I10 Essential (primary) hypertension: Secondary | ICD-10-CM

## 2021-01-15 DIAGNOSIS — Z86718 Personal history of other venous thrombosis and embolism: Secondary | ICD-10-CM

## 2021-01-15 DIAGNOSIS — M79605 Pain in left leg: Secondary | ICD-10-CM

## 2021-01-15 DIAGNOSIS — I25118 Atherosclerotic heart disease of native coronary artery with other forms of angina pectoris: Secondary | ICD-10-CM

## 2021-01-15 DIAGNOSIS — R55 Syncope and collapse: Secondary | ICD-10-CM

## 2021-01-15 DIAGNOSIS — M79604 Pain in right leg: Secondary | ICD-10-CM

## 2021-01-15 NOTE — Progress Notes (Signed)
Cardiology Office Note:    Date:  01/15/2021   ID:  Diane Barker, DOB Jun 29, 1950, MRN [id number]  PCP:  Diane Baton, MD  Cardiologist:  Diane Munroe, MD  Electrophysiologist:  None   Referring MD: Diane Baton, MD   Chief Complaint/Reason for Referral: CAD  History of Present Illness:    Diane Barker is a 70 y.o. female with a history of coronary artery disease with stable angina coming in today for follow-up.   Today, she has been doing well. She reports handling with stress at home better.   She experiences pain in her RLE with associated itching and bruising. Of note, she has difficulty taking her medicine in the morning. We discussed taking all her prescriptions in the evening. She self-discontinued Repatha because the medicine made her feel off and she is not interested in re-continuing it. However, she is compliant in taking atorvastatin.  We discussed her previous cath. She reports experiencing difficulty with her heart cath because of tortuosity in her RUE vasculature.  The patient denies chest pain, chest pressure, PND, or orthopnea. Denies cough, fever, chills, nausea, or vomiting. Denies syncope, presyncope, or snoring. Denies dizziness or lightheadedness.   She was hospitalized in early January 2022 for chest pain with a known proximal LAD lesion.  She was felt to have stable moderate one-vessel coronary artery disease with 60 to 70% stenosis of the proximal LAD.  Felt to have slight progression of disease but not felt to be enough to cause unstable angina.  Focus was on medical management of CAD.  She has had no interval chest pain.  She did not tolerate repatha unfortunately, though may have had concomitant covid 19 infection with a positive test at that time, she does not feel that the infection caused her to feel unwell and feels it was related to repatha.  Past Medical History:  Diagnosis Date   Anxiety    Arthritis    "all over my body" (01/15/2018)   Breast  cancer, right breast ([place]) 2000   Rt lumpectomy, Chemo/XRT/rt axillary node    Bronchial asthma    Chronic lower back pain    Depression    Diverticulosis    DVT (deep venous thrombosis) ([place]) 06/2013   RLE   GERD (gastroesophageal reflux disease)    High cholesterol    Hx of adenomatous colonic polyps    Hypertension    Obesity    Osteoarthritis    Personal history of chemotherapy    Personal history of radiation therapy    Pulmonary embolism ([place]) 06/2013   "both lungs"   Spondylolisthesis     Past Surgical History:  Procedure Laterality Date   BREAST BIOPSY Right 2000   BREAST LUMPECTOMY Right 2000   for CA txed w/ chemo and radiation, right   CARDIAC CATHETERIZATION  03/21/2020   HERNIA REPAIR     INTRAVASCULAR PRESSURE WIRE/FFR STUDY N/A 01/18/2018   Procedure: INTRAVASCULAR PRESSURE WIRE/FFR STUDY;  Surgeon: Burnell Blanks, MD;  Location: [place] [facility];  Service: Cardiovascular;  Laterality: N/A;   LEFT HEART CATH AND CORONARY ANGIOGRAPHY N/A 01/18/2018   Procedure: LEFT HEART CATH AND CORONARY ANGIOGRAPHY;  Surgeon: Burnell Blanks, MD;  Location: [place] [facility];  Service: Cardiovascular;  Laterality: N/A;   LEFT HEART CATH AND CORONARY ANGIOGRAPHY N/A 03/21/2020   Procedure: LEFT HEART CATH AND CORONARY ANGIOGRAPHY;  Surgeon: Diane Hampshire, MD;  Location: [place] [facility];  Service: Cardiovascular;  Laterality: N/A;  TUBAL LIGATION     UMBILICAL HERNIA REPAIR  1983    Current Medications: Current Meds  Medication Sig   acetaminophen (TYLENOL) 500 MG tablet Take 500 mg by mouth every 6 (six) hours as needed for mild pain.   atorvastatin (LIPITOR) 40 MG tablet Take 1 tablet (40 mg total) by mouth daily.   Budeson-Glycopyrrol-Formoterol (BREZTRI AEROSPHERE) 160-9-4.8 MCG/ACT AERO Inhale 2 puffs into the lungs in the morning and at bedtime.   Cholecalciferol (VITAMIN D) 2000 UNITS CAPS Take 4,000 Units by mouth daily.    ciclopirox  (PENLAC) 8 % solution Apply topically at bedtime. Apply over nail and surrounding skin. Apply daily over previous coat. After seven (7) days, may remove with alcohol and continue cycle.   clotrimazole (LOTRIMIN) 1 % cream Apply 1 application topically 2 (two) times daily as needed (skin irritation).   clotrimazole-betamethasone (LOTRISONE) cream Apply 1 application topically 2 (two) times daily.   dexlansoprazole (DEXILANT) 60 MG capsule Take 60 mg by mouth daily.   escitalopram (LEXAPRO) 10 MG tablet Take 10 mg by mouth at bedtime.   Fluconazole (DIFLUCAN PO) Take by mouth.   isosorbide mononitrate (IMDUR) 60 MG 24 hr tablet Take 60 mg by mouth daily.   nitroGLYCERIN (NITROSTAT) 0.4 MG SL tablet Place 1 tablet (0.4 mg total) under the tongue every 5 (five) minutes as needed.   PROAIR HFA 108 (90 Base) MCG/ACT inhaler INHALE 2 PUFFS INTO THE LUNGS EVERY 4 (FOUR) HOURS AS NEEDED FOR WHEEZING OR SHORTNESS OF BREATH.   rivaroxaban (XARELTO) 20 MG TABS tablet Take 20 mg by mouth daily with supper.   Spacer/Aero-Holding Chambers (AEROCHAMBER MV) inhaler Use as instructed   vitamin B-12 (CYANOCOBALAMIN) 500 MCG tablet Take 500 mcg by mouth daily.   [DISCONTINUED] budesonide-formoterol (SYMBICORT) 80-4.5 MCG/ACT inhaler Inhale 2 puffs into the lungs 2 (two) times daily. Inhale 2 puffs,then rise mouth twice daily maintenance   [DISCONTINUED] docusate sodium (COLACE) 100 MG capsule Take 100 mg by mouth 2 (two) times daily.   [DISCONTINUED] promethazine-codeine (PHENERGAN WITH CODEINE) 6.25-10 MG/5ML syrup Take 5 mLs by mouth every 6 (six) hours as needed for cough.     Allergies:   Cefdinir, Erythromycin, Erythromycin, Gabapentin, Gabapentin, Lipitor [atorvastatin], Morphine, Morphine and related, Penicillins, Penicillins, and Relafen [nabumetone]   Social History   Tobacco Use   Smoking status: Never   Smokeless tobacco: Never  Vaping Use   Vaping Use: Never used  Substance Use Topics   Alcohol  use: Never    Alcohol/week: 0.0 standard drinks   Drug use: Never     Family History: The patient's family history includes Asthma in her daughter and mother; Breast cancer in her maternal aunt; COPD in her mother; Colon cancer in her paternal aunt; Diabetes in her brother, daughter, and mother; Heart attack in her brother and mother; Hypertension in her brother; Kidney failure in her brother; Lung cancer in her father.  ROS:   Please see the history of present illness.    (+) RLE pain (+) Itchiness (+) Bruising All other systems reviewed and are negative.  EKGs/Labs/Other Studies Reviewed:    The following studies were reviewed today: CTA 04/03/20 Ost 1st Diag lesion is 30% stenosed. Ost LAD to Prox LAD lesion is 10% stenosed. Ost 2nd Diag lesion is 50% stenosed. Mid LAD lesion is 40% stenosed. Prox LAD lesion is 65% stenosed. Prox Cx to Mid Cx lesion is 30% stenosed. Ost RCA to Prox RCA lesion is 20% stenosed. Ost RPDA to RPDA  lesion is 20% stenosed. Mid RCA lesion is 20% stenosed. The left ventricular systolic function is normal. LV end diastolic pressure is normal. The left ventricular ejection fraction is 55-65% by visual estimate. 1.  Stable moderate one-vessel coronary artery disease with 60 to 70% stenosis in the proximal right coronary artery which is eccentric and calcified at the origin of the first diagonal.  The mid lesion seems to be better than before at 40%.  No evidence of obstructive disease. 2.  Normal LV systolic function normal left ventricular end-diastolic pressure.  Monitor 11/16/19 IMPRESSION: infrequent ectopy, brief episode of asymptomatic SVT. Symptoms associated with SR and sinus tachycardia.   Lower Venous DVT 06/20/19 RIGHT:  - No evidence of common femoral vein obstruction.  LEFT:  - There is no evidence of deep vein thrombosis in the lower extremity.  - A cystic structure is found in the popliteal fossa extending into the  proximal medial calf,  suggestive of possible ruptured Baker's cyst.   CTA 01/18/18 Ost RCA to Prox RCA lesion is 20% stenosed. Ost RPDA to RPDA lesion is 20% stenosed. Mid RCA lesion is 20% stenosed. Prox Cx to Mid Cx lesion is 30% stenosed. Prox LAD lesion is 60% stenosed. Ost LAD to Prox LAD lesion is 10% stenosed. Ost 1st Diag lesion is 30% stenosed. Mid LAD lesion is 50% stenosed. Ost 2nd Diag lesion is 50% stenosed. 1. Moderate, eccentric, calcified stenosis in the mid LAD at the takeoff of the first diagonal branch and moderate eccentric stenosis in the mid to distal LAD at the takeoff of the second diagonal branch. DFR and FFR analysis of the mid and distal LAD suggests the lesions are not flow limiting. In multiple angiographic views, the lesions appear to be moderate only.  2. Mild non-obstructive disease in the RCA and Circumflex  Echo 01/16/18 - Procedure narrative: Transthoracic echocardiography. Image    quality was poor. The study was technically difficult, as a    result of body habitus. Intravenous contrast (Definity) was    administered.  - Left ventricle: The cavity size was normal. Wall thickness was    increased in a pattern of moderate LVH. Systolic function was    normal. The estimated ejection fraction was in the range of 55%    to 60%. Wall motion was normal; there were no regional wall    motion abnormalities. Doppler parameters are consistent with    abnormal left ventricular relaxation (grade 1 diastolic    dysfunction). The E/e&' ratio is >15, suggesting elevated LV    filling pressure.  - Mitral valve: Calcified annulus. Mildly thickened leaflets .    There was trivial regurgitation.  - Left atrium: The atrium was normal in size.  - Inferior vena cava: The vessel was normal in size. The    respirophasic diameter changes were in the normal range (>= 50%),    consistent with normal central venous pressure.  Impressions:  - Technically difficult study. Definity contrast given  LVEF 55-60%,    moderate LVH, no definite regional wall motion abnormalities,    grade 1 DD, elevated LV filling pressure, trivial MR, normal LA    size, normal IVC.   EKG:  01/15/21: NSR, rate 65 bpm 07/09/2020: NSR, incomplete RBBB, non-specific t waves abnormality in the lateral leads, Rate: 72 bpm 12/28/2019: NSR, possible LVH  I have independently reviewed the images from cardiac catheterization performed 03/21/2020.  Recent Labs: 03/22/2020: BUN 20; Creatinine, Ser 1.11; Hemoglobin 13.1; Platelets 201; Potassium 3.6; Sodium  129  Recent Lipid Panel    Component Value Date/Time   CHOL 130 03/22/2020 0042   TRIG 58 03/22/2020 0042   HDL 46 03/22/2020 0042   CHOLHDL 2.8 03/22/2020 0042   VLDL 12 03/22/2020 0042   LDLCALC 72 03/22/2020 0042    Physical Exam:    VS:  BP 114/72   Pulse 65   Ht 5' 5.5" (1.664 m)   Wt 240 lb 3.2 oz (109 kg)   SpO2 97%   BMI 39.36 kg/m     Wt Readings from Last 5 Encounters:  01/15/21 240 lb 3.2 oz (109 kg)  08/01/20 248 lb (112.5 kg)  07/09/20 256 lb 3.2 oz (116.2 kg)  06/22/20 253 lb 3.2 oz (114.9 kg)  04/10/20 257 lb (116.6 kg)    Constitutional: No acute distress Eyes: sclera non-icteric, normal conjunctiva and lids ENMT: normal dentition, moist mucous membranes Cardiovascular: regular rhythm, normal rate, no murmurs. S1 and S2 normal. Radial pulses normal bilaterally. No jugular venous distention.  Respiratory: clear to auscultation bilaterally GI : normal bowel sounds, soft and nontender. No distention.   MSK: extremities warm, well perfused. No edema.  NEURO: grossly nonfocal exam, moves all extremities. PSYCH: alert and oriented x 3, normal mood and affect.   ASSESSMENT:    1. Coronary artery disease of native artery of native heart with stable angina pectoris ([place])   2. Essential hypertension   3. Hyperlipidemia, unspecified hyperlipidemia type   4. Medication management   5. History of pulmonary embolus (PE)   6. History of  DVT (deep vein thrombosis)   7. Syncope and collapse     PLAN:    CAD Hyperlipidemia  Hypertension  -she has a moderate to severe lesion in the proximal LAD which was felt to be mostly stable on the last cardiac catheterization in January for an admission for chest pain.  No intervention performed and medical management recommended.  Continue Xarelto 20 mg daily, atorvastatin 40 mg daily.  Continue isosorbide mononitrate 60 mg daily.  Patient has previously been intolerant to beta-blockers.  For statin intolerance, we tried repatha but she feels she was intolerant. Not interested in [place]. Discussed inclisiran, she will consider.  History of DVT and PE-continue Xarelto.  No bleeding events. LE discomfort, with history will repeat ultrasound per patient preference. Recommend compression socks.  Syncope-no interval syncope since last visit.  Total time of encounter: 30 minutes total time of encounter, including 25 minutes spent in face-to-face patient care on the date of this encounter. This time includes coordination of care and counseling regarding above mentioned problem list. Remainder of non-face-to-face time involved reviewing chart documents/testing relevant to the patient encounter and documentation in the medical record. I have independently reviewed documentation from referring provider.   Cherlynn Kaiser, MD, [facility]    Medication Adjustments/Labs and Tests Ordered: Current medicines are reviewed at length with the patient today.  Concerns regarding medicines are outlined above.   Orders Placed This Encounter  Procedures   Compression stockings   EKG 12-Lead   VAS [place] LOWER EXTREMITY VENOUS (DVT)    No orders of the defined types were placed in this encounter.    Patient Instructions  Medication Instructions:  PLEASE CONTINUE ATORVASTATIN 40mg  DAILY  *If you need a refill on your cardiac medications before your next appointment, please  call your pharmacy*  Testing/Procedures: Your physician has requested that you have a lower extremity venous duplex. This test is an ultrasound of  the veins in the legs. It looks at venous blood flow that carries blood from the heart to the legs or arms. Allow one hour for a Lower Venous exam. There are no restrictions or special instructions.  Follow-Up: At [facility], you and your health needs are our priority.  As part of our continuing mission to provide you with exceptional heart care, we have created designated Provider Care Teams.  These Care Teams include your primary Cardiologist (physician) and Advanced Practice Providers (APPs -  Physician Assistants and Nurse Practitioners) who all work together to provide you with the care you need, when you need it.  We recommend signing up for the patient portal called "MyChart".  Sign up information is provided on this After Visit Summary.  MyChart is used to connect with patients for Virtual Visits (Telemedicine).  Patients are able to view lab/test results, encounter notes, upcoming appointments, etc.  Non-urgent messages can be sent to your provider as well.   To learn more about what you can do with MyChart, go to [email or web address].    Your next appointment:   NEEDS FOLLOW UP WITH CV[facility] FOR CONSIDERATION OF INCLISIRAN  Then   6 month(s)  The format for your next appointment:   In Person  Provider:   Cherlynn Kaiser, MD  Other Instructions COMPRESSION STOCKINGS INFORMATION GIVEN    I,Mykaella Javier,acting as a scribe for Diane Munroe, MD.,have documented all relevant documentation on the behalf of Diane Munroe, MD,as directed by  Diane Munroe, MD while in the presence of Diane Munroe, MD.  I, Diane Munroe, MD, have reviewed all documentation for this visit. The documentation on today's date of service for the exam, diagnosis, procedures, and orders are all accurate and complete.

## 2021-01-15 NOTE — Progress Notes (Deleted)
Cardiology Office Note:    Date:  01/15/2021   ID:  Diane Barker, DOB May 02, 1950, MRN [id number]  PCP:  Diane Baton, MD  Cardiologist:  Diane Munroe, MD  Electrophysiologist:  None   Referring MD: Diane Baton, MD   Chief Complaint/Reason for Referral: CAD  History of Present Illness:    Diane Barker is a 70 y.o. female with a history of coronary artery disease with stable angina.    ***  She was hospitalized in early January 2022 for chest pain with a known proximal LAD lesion.  She was felt to have stable moderate one-vessel coronary artery disease with 60 to 70% stenosis of the proximal LAD.  Felt to have slight progression of disease but not felt to be enough to cause unstable angina.  Focus was on medical management of CAD.  She has had no interval chest pain.  She did not tolerate repatha unfortunately, though may have had concomitant covid 19 infection with a positive test at that time. ***  Past Medical History:  Diagnosis Date   Anxiety    Arthritis    "all over my body" (01/15/2018)   Breast cancer, right breast ([place]) 2000   Rt lumpectomy, Chemo/XRT/rt axillary node    Bronchial asthma    Chronic lower back pain    Depression    Diverticulosis    DVT (deep venous thrombosis) ([place]) 06/2013   RLE   GERD (gastroesophageal reflux disease)    High cholesterol    Hx of adenomatous colonic polyps    Hypertension    Obesity    Osteoarthritis    Personal history of chemotherapy    Personal history of radiation therapy    Pulmonary embolism ([place]) 06/2013   "both lungs"   Spondylolisthesis     Past Surgical History:  Procedure Laterality Date   BREAST BIOPSY Right 2000   BREAST LUMPECTOMY Right 2000   for CA txed w/ chemo and radiation, right   CARDIAC CATHETERIZATION  03/21/2020   HERNIA REPAIR     INTRAVASCULAR PRESSURE WIRE/FFR STUDY N/A 01/18/2018   Procedure: INTRAVASCULAR PRESSURE WIRE/FFR STUDY;  Surgeon: Diane Blanks, MD;  Location: [place] [facility];  Service: Cardiovascular;  Laterality: N/A;   LEFT HEART CATH AND CORONARY ANGIOGRAPHY N/A 01/18/2018   Procedure: LEFT HEART CATH AND CORONARY ANGIOGRAPHY;  Surgeon: Diane Blanks, MD;  Location: [place] [facility];  Service: Cardiovascular;  Laterality: N/A;   LEFT HEART CATH AND CORONARY ANGIOGRAPHY N/A 03/21/2020   Procedure: LEFT HEART CATH AND CORONARY ANGIOGRAPHY;  Surgeon: Diane Hampshire, MD;  Location: [place] [facility];  Service: Cardiovascular;  Laterality: N/A;   TUBAL LIGATION     UMBILICAL HERNIA REPAIR  1983    Current Medications: No outpatient medications have been marked as taking for the 01/15/21 encounter (Appointment) with Diane Munroe, MD.     Allergies:   Cefdinir, Erythromycin, Erythromycin, Gabapentin, Gabapentin, Lipitor [atorvastatin], Morphine, Morphine and related, Penicillins, Penicillins, and Relafen [nabumetone]   Social History   Tobacco Use   Smoking status: Never   Smokeless tobacco: Never  Vaping Use   Vaping Use: Never used  Substance Use Topics   Alcohol use: Never    Alcohol/week: 0.0 standard drinks   Drug use: Never     Family History: The patient's family history includes Asthma in her daughter and mother; Breast cancer in her maternal aunt; COPD in her mother; Colon cancer in her paternal aunt; Diabetes in  her brother, daughter, and mother; Heart attack in her brother and mother; Hypertension in her brother; Kidney failure in her brother; Lung cancer in her father.  ROS:   Please see the history of present illness.    All other systems reviewed and are negative.  EKGs/Labs/Other Studies Reviewed:    The following studies were reviewed today:  EKG:  12/28/2019: NSR, possible LVH 07/09/2020: NSR, incomplete RBBB, non-specific t waves abnormality in the lateral leads, Rate: 72 bpm  I have independently reviewed the images from cardiac catheterization performed 03/21/2020.  Recent Labs: 03/22/2020: BUN  20; Creatinine, Ser 1.11; Hemoglobin 13.1; Platelets 201; Potassium 3.6; Sodium 129  Recent Lipid Panel    Component Value Date/Time   CHOL 130 03/22/2020 0042   TRIG 58 03/22/2020 0042   HDL 46 03/22/2020 0042   CHOLHDL 2.8 03/22/2020 0042   VLDL 12 03/22/2020 0042   LDLCALC 72 03/22/2020 0042    Physical Exam:    VS:  There were no vitals taken for this visit.    Wt Readings from Last 5 Encounters:  08/01/20 248 lb (112.5 kg)  07/09/20 256 lb 3.2 oz (116.2 kg)  06/22/20 253 lb 3.2 oz (114.9 kg)  04/10/20 257 lb (116.6 kg)  03/20/20 255 lb (115.7 kg)    Constitutional: No acute distress Eyes: sclera non-icteric, normal conjunctiva and lids ENMT: normal dentition, moist mucous membranes Cardiovascular: regular rhythm, normal rate, no murmurs. S1 and S2 normal. Radial pulses normal bilaterally. No jugular venous distention.  Respiratory: clear to auscultation bilaterally GI : normal bowel sounds, soft and nontender. No distention.   MSK: extremities warm, well perfused. No edema.  NEURO: grossly nonfocal exam, moves all extremities. PSYCH: alert and oriented x 3, normal mood and affect.   ASSESSMENT:    1. Coronary artery disease of native artery of native heart with stable angina pectoris ([place])   2. Essential hypertension   3. Hyperlipidemia, unspecified hyperlipidemia type   4. Medication management   5. History of pulmonary embolus (PE)   6. History of DVT (deep vein thrombosis)   7. Syncope and collapse     PLAN:    CAD Hyperlipidemia  Hypertension  -she has a moderate to severe lesion in the proximal LAD which was felt to be mostly stable on the last cardiac catheterization in January for an admission for chest pain.  No intervention performed and medical management recommended.  Continue Xarelto, atorvastatin though we will decrease the dose of the atorvastatin given her feeling that she has significant fatigue on the 80 mg dose.  We will reduce to 40 mg daily.   Continue isosorbide mononitrate 60 mg daily.  Patient has previously been intolerant to beta-blockers.  For statin intolerance, we tried repatha but she feels she was intolerant ***.  History of DVT and PE-continue Xarelto.  No bleeding events.  Syncope-no interval syncope since last visit.   Total time of encounter: *** minutes total time of encounter, including *** minutes spent in face-to-face patient care on the date of this encounter. This time includes coordination of care and counseling regarding above mentioned problem list. Remainder of non-face-to-face time involved reviewing chart documents/testing relevant to the patient encounter and documentation in the medical record. I have independently reviewed documentation from referring provider.   Cherlynn Kaiser, MD, [facility]    Medication Adjustments/Labs and Tests Ordered: Current medicines are reviewed at length with the patient today.  Concerns regarding medicines are outlined above.  No orders of the defined types were placed in this encounter.   No orders of the defined types were placed in this encounter.   There are no Patient Instructions on file for this visit.

## 2021-01-15 NOTE — Patient Instructions (Signed)
Medication Instructions:  PLEASE CONTINUE ATORVASTATIN 40mg  DAILY  *If you need a refill on your cardiac medications before your next appointment, please call your pharmacy*  Testing/Procedures: Your physician has requested that you have a lower extremity venous duplex. This test is an ultrasound of the veins in the legs. It looks at venous blood flow that carries blood from the heart to the legs or arms. Allow one hour for a Lower Venous exam. There are no restrictions or special instructions.  Follow-Up: At [facility], you and your health needs are our priority.  As part of our continuing mission to provide you with exceptional heart care, we have created designated Provider Care Teams.  These Care Teams include your primary Cardiologist (physician) and Advanced Practice Providers (APPs -  Physician Assistants and Nurse Practitioners) who all work together to provide you with the care you need, when you need it.  We recommend signing up for the patient portal called "MyChart".  Sign up information is provided on this After Visit Summary.  MyChart is used to connect with patients for Virtual Visits (Telemedicine).  Patients are able to view lab/test results, encounter notes, upcoming appointments, etc.  Non-urgent messages can be sent to your provider as well.   To learn more about what you can do with MyChart, go to [email or web address].    Your next appointment:   NEEDS FOLLOW UP WITH CV[facility] FOR CONSIDERATION OF INCLISIRAN  Then   6 month(s)  The format for your next appointment:   In Person  Provider:   Cherlynn Kaiser, MD  Other Instructions COMPRESSION STOCKINGS INFORMATION GIVEN

## 2021-01-28 ENCOUNTER — Ambulatory Visit (HOSPITAL_COMMUNITY)
Admission: RE | Admit: 2021-01-28 | Discharge: 2021-01-28 | Disposition: A | Discharge status: Home / Self Care | Payer: 59 | Source: Ambulatory Visit | Attending: Cardiology | Admitting: Cardiology

## 2021-01-28 ENCOUNTER — Other Ambulatory Visit: Payer: Self-pay

## 2021-01-28 DIAGNOSIS — M79605 Pain in left leg: Secondary | ICD-10-CM | POA: Diagnosis present

## 2021-01-28 DIAGNOSIS — M79604 Pain in right leg: Secondary | ICD-10-CM | POA: Diagnosis present

## 2021-01-28 DIAGNOSIS — Z86718 Personal history of other venous thrombosis and embolism: Secondary | ICD-10-CM | POA: Diagnosis present

## 2021-02-04 ENCOUNTER — Ambulatory Visit: Payer: 59

## 2021-02-27 ENCOUNTER — Other Ambulatory Visit: Payer: Self-pay | Admitting: Podiatry

## 2021-05-01 ENCOUNTER — Other Ambulatory Visit: Payer: Self-pay | Admitting: Internal Medicine

## 2021-05-01 DIAGNOSIS — R0609 Other forms of dyspnea: Secondary | ICD-10-CM

## 2021-05-01 DIAGNOSIS — R591 Generalized enlarged lymph nodes: Secondary | ICD-10-CM

## 2021-05-17 ENCOUNTER — Other Ambulatory Visit: Payer: Self-pay | Admitting: Internal Medicine

## 2021-05-17 ENCOUNTER — Telehealth: Payer: Self-pay | Admitting: Internal Medicine

## 2021-05-17 DIAGNOSIS — I2510 Atherosclerotic heart disease of native coronary artery without angina pectoris: Secondary | ICD-10-CM

## 2021-05-17 DIAGNOSIS — I208 Other forms of angina pectoris: Secondary | ICD-10-CM

## 2021-05-17 MED ORDER — ISOSORBIDE MONONITRATE ER 60 MG PO TB24: 60.0000 mg | ORAL_TABLET | Freq: Every day | ORAL | 1 refills | Status: DC

## 2021-05-17 NOTE — Telephone Encounter (Signed)
? ? ?*  STAT* If patient is at the pharmacy, call can be transferred to refill team. ? ? ?1. Which medications need to be refilled? (please list name of each medication and dose if known) isosorbide mononitrate (IMDUR) 60 MG 24 hr tablet ? ?2. Which pharmacy/location (including street and city if local pharmacy) is medication to be sent to? [id number] - [place], [place] - [address] AT [place] ? ?3. Do they need a 30 day or 90 day supply? 90 days  ?

## 2021-05-17 NOTE — Telephone Encounter (Signed)
Refills has been sent to the pharmacy. 

## 2021-05-29 ENCOUNTER — Other Ambulatory Visit: Payer: Self-pay | Admitting: Internal Medicine

## 2021-05-29 ENCOUNTER — Other Ambulatory Visit: Payer: 59

## 2021-05-29 ENCOUNTER — Ambulatory Visit
Admission: RE | Admit: 2021-05-29 | Discharge: 2021-05-29 | Disposition: A | Discharge status: Home / Self Care | Payer: Self-pay | Source: Ambulatory Visit | Attending: Internal Medicine | Admitting: Internal Medicine

## 2021-05-29 ENCOUNTER — Other Ambulatory Visit: Payer: Self-pay

## 2021-05-29 ENCOUNTER — Ambulatory Visit
Admission: RE | Admit: 2021-05-29 | Discharge: 2021-05-29 | Disposition: A | Discharge status: Home / Self Care | Payer: 59 | Source: Ambulatory Visit | Attending: Internal Medicine | Admitting: Internal Medicine

## 2021-05-29 DIAGNOSIS — R52 Pain, unspecified: Secondary | ICD-10-CM

## 2021-05-29 DIAGNOSIS — R0609 Other forms of dyspnea: Secondary | ICD-10-CM

## 2021-05-29 DIAGNOSIS — R591 Generalized enlarged lymph nodes: Secondary | ICD-10-CM

## 2021-05-29 MED ORDER — IOPAMIDOL (ISOVUE-300) INJECTION 61%
75.0000 mL | Freq: Once | INTRAVENOUS | Status: AC | PRN
  Administered 2021-05-29: 75 mL via INTRAVENOUS

## 2021-06-02 ENCOUNTER — Other Ambulatory Visit: Payer: Self-pay | Admitting: Internal Medicine

## 2021-06-05 NOTE — Telephone Encounter (Signed)
?*  STAT* If patient is at the pharmacy, call can be transferred to refill team. ? ? ?1. Which medications need to be refilled? (please list name of each medication and dose if known) atorvastatin (LIPITOR) 40 MG tablet ? ?2. Which pharmacy/location (including street and city if local pharmacy) is medication to be sent to? [id number]- [place],  - [address] [place]? ?3. Do they need a 30 day or 90 day supply? 90 day ? ?  ?

## 2021-06-22 NOTE — Progress Notes (Deleted)
? Patient ID: Diane Barker, female    DOB: October 17, 1950, 71 y.o.   MRN: [id number] ? ?HPI ?F never smoker followed for bronchitis with hx rhinitis, GERD, hx breast cancer, Hx DVT/ PE/ Xarelto. ?Office Spirometry 04/30/2015- WNL- FVC 2.75/87%, FEV1 2.29/93%, FEV1/FVC 0.83, FEF 25-75 percent 2.83/126%. ?PFT 01/08/2018- insignificant response to dilator, mild restriction, diffusion mildly reduced.  FVC 2.52/78%, FEV1 2.21/90%, ratio 1.88, FEF 25-75% 3.30/259%, TLC 75%, DLCO 63% ?------------------------------------------------------------------------------ ? ? ?06/22/20- 71 year old female never smoker followed for bronchitis, rhinitis, complicated by GERD, history breast cancer/XRT, history DVT/PE/Xarelto, Covid infection November 2020, CAD,  ?-Ventolin hfa, Neb Duoneb, Symbicort 160- not using ?Prednisone taper 06/15/20, ZPAK, Tamiflu   For reported probable Influenza A (confirmed in family). ?Covid vx- ?Flu vax- ?-----Pt states that she does not feel any better after recent meds that were prescribed (incl Tamiflu, prednisone). States she has increased SOB, coughing, and some wheezing. States that she has not had a temp recently. ?Twice Neg Covid test.  ?Covid vax-none ?Asks [facility]. ?Sore throat,diarrhea. Finished Zpak and almost done prednisone. Cough now dry. Tessalon no help. ?CXR 10/10/19-1V- ?No active cardiopulmonary disease.  No interval change. ? ?06/24/21-  71 year old female never smoker followed for bronchitis, rhinitis, complicated by GERD, history breast cancer/XRT, history DVT/PE/Xarelto, Covid infection November 2020, CAD,  ?-Ventolin hfa, Neb Duoneb, Symbicort 160- not using=. ?Covid vx- ?Flu vax- ?[facility] 4/1 acute bronchospasm ?ED 1/19- bronchiolitis, cervical adenopathy, recent Zpak, prednisone, albuterol hfa, for viral inf. ? ?CT chest 05/29/21- ? ?Review of Systems-See HPI   + = positive ?Constitutional:   No-   weight loss, night sweats, fevers, chills, fatigue, lassitude. ?HEENT:   +  headaches,  No-difficulty swallowing, tooth/dental problems, +sore throat,  ?      sneezing, itching, ear ache, nasal congestion, +post nasal drip,  ?CV:  +atypical chest pain, orthopnea, PND, swelling in lower extremities, anasarca,  dizziness, palpitations ?Resp: +shortness of breath with exertion or at rest.   ?           + productive cough,  + non-productive cough,  No- coughing up of blood.   ?        change in color of mucus.  No- wheezing.   ?Skin: No-   rash or lesions. ?GI:  +  heartburn, indigestion, abdominal pain, +nausea, vomiting,  ?GU: ?MS:  No-   joint pain or swelling, + R flank pain ?Neuro-     nothing unusual ?Psych:  No- change in mood or affect. No depression or anxiety.  No memory loss. ? ?Objective:  ?OBJ- Physical Exam ?General- Alert, Oriented, Affect-appropriate/ calm, Distress- none acute, + obese ?Skin- rash-none, lesions- none, excoriation+  ?Lymphadenopathy- none ?Head- atraumatic ?           Eyes- Gross vision intact, PERRLA, conjunctivae and secretions clear ?           Ears- Hearing, canals-normal ?           Nose- clear, no-Septal dev, mucus, polyps, erosion, perforation  ?           Throat- Mallampati IV , mucosa clear , drainage- none, tonsils- atrophic,  ?Neck- flexible , trachea midline, no stridor , thyroid nl, carotid no bruit ?Chest - symmetrical excursion , unlabored ?          Heart/CV- RRR , no murmur , no gallop  , no rub, nl s1 s2 ?                          -  JVD- none , edema-none, stasis changes- none, varices- none ?          Lung- + clear, unlabored, wheeze- none, cough+dry, dullness-none, rub- none ?          Chest wall-  ?Abd-  ?Br/ Gen/ Rectal- Not done, not indicated ?Extrem- cyanosis- none, clubbing, none, atrophy- none, strength- nl, + excoriated stasis dermatitis ?Neuro- grossly intact to observation ? ? ? ? ? ? ?

## 2021-06-24 ENCOUNTER — Ambulatory Visit: Payer: 59 | Admitting: Internal Medicine

## 2021-06-29 NOTE — Progress Notes (Signed)
? Patient ID: Diane Barker, female    DOB: 1951-01-16, 71 y.o.   MRN: [id number] ? ?HPI ?F never smoker followed for bronchitis with hx rhinitis, GERD, hx breast cancer, Hx DVT/ PE/ Xarelto. ?Office Spirometry 04/30/2015- WNL- FVC 2.75/87%, FEV1 2.29/93%, FEV1/FVC 0.83, FEF 25-75 percent 2.83/126%. ?PFT 01/08/2018- insignificant response to dilator, mild restriction, diffusion mildly reduced.  FVC 2.52/78%, FEV1 2.21/90%, ratio 1.88, FEF 25-75% 3.30/259%, TLC 75%, DLCO 63% ?------------------------------------------------------------------------------ ? ? ? ?06/22/20- 71 year old female never smoker followed for bronchitis, rhinitis, complicated by GERD, history breast cancer/XRT, history DVT/PE/Xarelto, Covid infection November 2020, CAD,  ?-Ventolin hfa, Neb Duoneb, Symbicort 160- not using ?Prednisone taper 06/15/20, ZPAK, Tamiflu   For reported probable Influenza A (confirmed in family). ?Covid vx- ?Flu vax- ?-----Pt states that she does not feel any better after recent meds that were prescribed (incl Tamiflu, prednisone). States she has increased SOB, coughing, and some wheezing. States that she has not had a temp recently. ?Twice Neg Covid test.  ?Covid vax-none ?Asks [facility]. ?Sore throat,diarrhea. Finished Zpak and almost done prednisone. Cough now dry. Tessalon no help. ?CXR 10/10/19-1V- ?No active cardiopulmonary disease.  No interval change. ? ?07/02/21- 71 year old female never smoker followed for Bronchitis, Rhinitis, complicated by GERD, history breast cancer/XRT, history DVT/PE/Xarelto, Covid infection November 2020, CAD,  ?-Ventolin hfa, Neb Duoneb,  ?Covid vax-none ?Flu vax- ?ED 04/04/21- Acute bronchitis, cervical adenopathy> ?Urgent Care 06/15/21 for viral acute bronchospasm>Zpak, pred, neb albuterol,  ?Disliked Breztri- didn't seem effective ?Still has mild cough but nonproductive and no fever.  Waxing and waning left frontal headache over the past week.  No nasal discharge.  Ears feel a bit full but on  exam only a little nonobstructive cerumen. ?Cervical adenopathy noted this winter during acute viral illness but now no longer palpable, consistent with reactive adenopathy. ?CT neck 05/29/21- ?IMPRESSION: ?No neck mass. ?Several small and nonenlarged right posterior triangle lymph nodes ?underlie the skin marker. These are nonspecific and suggest clinical ?follow-up since palpable. ? ?Review of Systems-See HPI   + = positive ?Constitutional:   No-   weight loss, night sweats, fevers, chills, fatigue, lassitude. ?HEENT:   +  headaches, No-difficulty swallowing, tooth/dental problems, +sore throat,  ?      sneezing, itching, ear ache, nasal congestion, +post nasal drip,  ?CV:  +atypical chest pain, orthopnea, PND, swelling in lower extremities, anasarca,  dizziness, palpitations ?Resp: +shortness of breath with exertion or at rest.   ?           + productive cough,  + non-productive cough,  No- coughing up of blood.   ?        change in color of mucus.  No- wheezing.   ?Skin: No-   rash or lesions. ?GI:  +  heartburn, indigestion, abdominal pain, +nausea, vomiting,  ?GU: ?MS:  No-   joint pain or swelling, + R flank pain ?Neuro-     nothing unusual ?Psych:  No- change in mood or affect. No depression or anxiety.  No memory loss. ? ?Objective:  ?OBJ- Physical Exam ?General- Alert, Oriented, Affect-appropriate/ calm, Distress- none acute, + obese ?Skin- rash-none, lesions- none, excoriation+  ?Lymphadenopathy- none ?Head- atraumatic ?           Eyes- Gross vision intact, PERRLA, conjunctivae and secretions clear ?           Ears- Hearing, canals-normal ?           Nose- clear, no-Septal dev, mucus, polyps, erosion, perforation  ?  Throat- Mallampati IV , mucosa clear , drainage- none, tonsils- atrophic, ? + Upper plate and many missing teeth ?Neck- flexible , trachea midline, no stridor , thyroid nl, carotid no bruit ?Chest - symmetrical excursion , unlabored ?          Heart/CV- RRR , no murmur , no gallop  ,  no rub, nl s1 s2 ?                          - JVD- none , edema-none, stasis changes- none, varices- none ?          Lung- + few loose rhonchi right chest, unlabored, wheeze- none, cough+none, dullness-none, rub- none ?          Chest wall-  ?Abd-  ?Br/ Gen/ Rectal- Not done, not indicated ?Extrem- cyanosis- none, clubbing, none, atrophy- none, strength- nl, + excoriated stasis dermatitis ?Neuro- grossly intact to observation ? ? ? ? ? ? ?

## 2021-07-02 ENCOUNTER — Encounter: Payer: Self-pay | Admitting: Internal Medicine

## 2021-07-02 ENCOUNTER — Ambulatory Visit (INDEPENDENT_AMBULATORY_CARE_PROVIDER_SITE_OTHER): Payer: 59 | Admitting: Internal Medicine

## 2021-07-02 DIAGNOSIS — I2609 Other pulmonary embolism with acute cor pulmonale: Secondary | ICD-10-CM | POA: Diagnosis not present

## 2021-07-02 DIAGNOSIS — I2782 Chronic pulmonary embolism: Secondary | ICD-10-CM

## 2021-07-02 DIAGNOSIS — J440 Chronic obstructive pulmonary disease with acute lower respiratory infection: Secondary | ICD-10-CM | POA: Diagnosis not present

## 2021-07-02 MED ORDER — TRELEGY ELLIPTA 200-62.5-25 MCG/ACT IN AEPB: 1.0000 | INHALATION_SPRAY | Freq: Every day | RESPIRATORY_TRACT | 0 refills | Status: DC

## 2021-07-02 NOTE — Assessment & Plan Note (Signed)
Acute component has resolved.  There is some residual chronic bronchitis. ?Plan-try samples of Trelegy 200 ?

## 2021-07-02 NOTE — Assessment & Plan Note (Signed)
Remains on Xarelto, no recurrence. ?

## 2021-07-02 NOTE — Patient Instructions (Addendum)
Order- sample x 2 Trelegy 200    inhale 1 puff then rinse mouth, once daily ? ?You can still use your nebulizer machine or albuterol rescue inhaler when needed ? ?Try using your Sudafed decongestant. See if that eases the headache ? ?Please call if we can help ?

## 2021-07-02 NOTE — Addendum Note (Signed)
Addended by: Mathis Bud on: 07/02/2021 11:39 AM ? ? Modules accepted: Orders ? ?

## 2021-10-10 ENCOUNTER — Encounter: Payer: Self-pay | Admitting: Internal Medicine

## 2021-10-10 ENCOUNTER — Ambulatory Visit (INDEPENDENT_AMBULATORY_CARE_PROVIDER_SITE_OTHER): Payer: 59 | Admitting: Internal Medicine

## 2021-10-10 VITALS — BP 142/74 | HR 62 | Ht 65.5 in | Wt 244.6 lb

## 2021-10-10 DIAGNOSIS — I25118 Atherosclerotic heart disease of native coronary artery with other forms of angina pectoris: Secondary | ICD-10-CM | POA: Diagnosis not present

## 2021-10-10 DIAGNOSIS — I10 Essential (primary) hypertension: Secondary | ICD-10-CM

## 2021-10-10 DIAGNOSIS — R55 Syncope and collapse: Secondary | ICD-10-CM

## 2021-10-10 DIAGNOSIS — R0609 Other forms of dyspnea: Secondary | ICD-10-CM

## 2021-10-10 DIAGNOSIS — I2782 Chronic pulmonary embolism: Secondary | ICD-10-CM

## 2021-10-10 DIAGNOSIS — Z86718 Personal history of other venous thrombosis and embolism: Secondary | ICD-10-CM

## 2021-10-10 DIAGNOSIS — R5383 Other fatigue: Secondary | ICD-10-CM

## 2021-10-10 DIAGNOSIS — I2609 Other pulmonary embolism with acute cor pulmonale: Secondary | ICD-10-CM

## 2021-10-10 DIAGNOSIS — E785 Hyperlipidemia, unspecified: Secondary | ICD-10-CM | POA: Diagnosis not present

## 2021-10-10 NOTE — Patient Instructions (Signed)
Medication Instructions:  No Changes In Medications at this time.  *If you need a refill on your cardiac medications before your next appointment, please call your pharmacy*  Lab Work: None Ordered At This Time.  If you have labs (blood work) drawn today and your tests are completely normal, you will receive your results only by: [place] (if you have MyChart) OR A paper copy in the mail If you have any lab test that is abnormal or we need to change your treatment, we will call you to review the results.  Testing/Procedures: Your physician has recommended that you have a cardiopulmonary stress test (CPX). CPX testing is a non-invasive measurement of heart and lung function. It replaces a traditional treadmill stress test. This type of test provides a tremendous amount of information that relates not only to your present condition but also for future outcomes. This test combines measurements of you ventilation, respiratory gas exchange in the lungs, electrocardiogram (EKG), blood pressure and physical response before, during, and following an exercise protocol.  Your physician has requested that you have an echocardiogram. Echocardiography is a painless test that uses sound waves to create images of your heart. It provides your doctor with information about the size and shape of your heart and how well your heart's chambers and valves are working. You may receive an ultrasound enhancing agent through an IV if needed to better visualize your heart during the echo.This procedure takes approximately one hour. There are no restrictions for this procedure. This will take place at the [address], [address].   Your physician has recommended that you have a sleep study. This test records several body functions during sleep, including: brain activity, eye movement, oxygen and carbon dioxide blood levels, heart rate and rhythm, breathing rate and rhythm, the flow of air through your mouth and  nose, snoring, body muscle movements, and chest and belly movement. SOMEONE WILL REACH OUT TO GET YOU SCHEDULED FOR THIS  Follow-Up: At [facility], you and your health needs are our priority.  As part of our continuing mission to provide you with exceptional heart care, we have created designated Provider Care Teams.  These Care Teams include your primary Cardiologist (physician) and Advanced Practice Providers (APPs -  Physician Assistants and Nurse Practitioners) who all work together to provide you with the care you need, when you need it.  Your next appointment:   SEPTEMBER 28th at 1:40PM  The format for your next appointment:   In Person  Provider:   Elouise Munroe, MD

## 2021-10-10 NOTE — Progress Notes (Signed)
Cardiology Office Note:    Date:  10/10/2021   ID:  Diane Barker, DOB [birth year], MRN [id number]  PCP:  Shon Baton, MD  Cardiologist:  Elouise Munroe, MD  Electrophysiologist:  None   Referring MD: Shon Baton, MD   Chief Complaint/Reason for Referral: CAD  History of Present Illness:    Diane Barker is a 71 y.o. female with a history of coronary artery disease with stable angina coming in today for follow-up.   She was hospitalized in early January 2022 for chest pain with a known proximal LAD lesion.  She was felt to have stable moderate one-vessel coronary artery disease with 60 to 70% stenosis of the proximal LAD.  Felt to have slight progression of disease but not felt to be enough to cause unstable angina.  Focus was on medical management of CAD.    She did not tolerate repatha unfortunately, though may have had concomitant covid 19 infection with a positive test at that time, she does not feel that the infection caused her to feel unwell and feels it was related to repatha.  She continues to have DOE. I have reviewed her prior PFTs which suggested exercise PFTS may be helpful and results of routine PFT showed: "Conclusions: The reduced lung volumes, increased FEV1/FVC ratio and diffusion defect suggest an interstitial process such as fibrosis or interstitial inflammation. Anemia cannot be excluded as a potential cause of the diffusion defect without correcting the observed diffusing capacity for hemoglobin. In view of the severity of the diffusion defect, studies with exercise would be helpful to evaluate the presence of hypoxemia."  DOE had previously been one of her anginal symptoms, in addition to chest pressure with left arm radiation. We discussed functional testing given recent cath in 2022.   Past Medical History:  Diagnosis Date   Anxiety    Arthritis    "all over my body" (01/15/2018)   Breast cancer, right breast ([place]) 2000   Rt lumpectomy, Chemo/XRT/rt  axillary node    Bronchial asthma    Chronic lower back pain    Depression    Diverticulosis    DVT (deep venous thrombosis) ([place]) 06/2013   RLE   GERD (gastroesophageal reflux disease)    High cholesterol    Hx of adenomatous colonic polyps    Hypertension    Obesity    Osteoarthritis    Personal history of chemotherapy    Personal history of radiation therapy    Pulmonary embolism ([place]) 06/2013   "both lungs"   Spondylolisthesis     Past Surgical History:  Procedure Laterality Date   BREAST BIOPSY Right 2000   BREAST LUMPECTOMY Right 2000   for CA txed w/ chemo and radiation, right   CARDIAC CATHETERIZATION  03/21/2020   HERNIA REPAIR     INTRAVASCULAR PRESSURE WIRE/FFR STUDY N/A 01/18/2018   Procedure: INTRAVASCULAR PRESSURE WIRE/FFR STUDY;  Surgeon: Burnell Blanks, MD;  Location: [facility];  Service: Cardiovascular;  Laterality: N/A;   LEFT HEART CATH AND CORONARY ANGIOGRAPHY N/A 01/18/2018   Procedure: LEFT HEART CATH AND CORONARY ANGIOGRAPHY;  Surgeon: Burnell Blanks, MD;  Location: [facility];  Service: Cardiovascular;  Laterality: N/A;   LEFT HEART CATH AND CORONARY ANGIOGRAPHY N/A 03/21/2020   Procedure: LEFT HEART CATH AND CORONARY ANGIOGRAPHY;  Surgeon: Wellington Hampshire, MD;  Location: [facility];  Service: Cardiovascular;  Laterality: N/A;   TUBAL LIGATION     UMBILICAL HERNIA REPAIR  1983    Current Medications: Current Meds  Medication Sig   acetaminophen (TYLENOL) 500 MG tablet Take 500 mg by mouth every 6 (six) hours as needed for mild pain.   atorvastatin (LIPITOR) 40 MG tablet TAKE 1 TABLET BY MOUTH EVERY DAY   Budeson-Glycopyrrol-Formoterol (BREZTRI AEROSPHERE) 160-9-4.8 MCG/ACT AERO Inhale 2 puffs into the lungs in the morning and at bedtime.   Cholecalciferol (VITAMIN D) 2000 UNITS CAPS Take 4,000 Units by mouth daily.    ciclopirox (PENLAC) 8 % solution APPLY TOPICALLY AT BEDTIME. APPLY OVER NAIL AND SURROUNDING  SKIN. APPLY DAILY OVER PREVIOUS COAT. AFTER SEVEN (7) DAYS, MAY REMOVE WITH ALCOHOL AND CONTINUE CYCLE.   clotrimazole-betamethasone (LOTRISONE) cream Apply 1 application topically 2 (two) times daily.   dexlansoprazole (DEXILANT) 60 MG capsule Take 60 mg by mouth daily.   escitalopram (LEXAPRO) 10 MG tablet Take 10 mg by mouth at bedtime.   isosorbide mononitrate (IMDUR) 60 MG 24 hr tablet Take 1 tablet (60 mg total) by mouth daily.   nitroGLYCERIN (NITROSTAT) 0.4 MG SL tablet Place 1 tablet (0.4 mg total) under the tongue every 5 (five) minutes as needed.   PROAIR HFA 108 (90 Base) MCG/ACT inhaler INHALE 2 PUFFS INTO THE LUNGS EVERY 4 (FOUR) HOURS AS NEEDED FOR WHEEZING OR SHORTNESS OF BREATH.   rivaroxaban (XARELTO) 20 MG TABS tablet Take 20 mg by mouth daily with supper.   Spacer/Aero-Holding Chambers (AEROCHAMBER MV) inhaler Use as instructed   vitamin B-12 (CYANOCOBALAMIN) 500 MCG tablet Take 500 mcg by mouth daily.     Allergies:   Cefdinir, Erythromycin, Erythromycin, Gabapentin, Gabapentin, Lipitor [atorvastatin], Morphine, Morphine and related, Penicillins, Penicillins, and Relafen [nabumetone]   Social History   Tobacco Use   Smoking status: Never   Smokeless tobacco: Never  Vaping Use   Vaping Use: Never used  Substance Use Topics   Alcohol use: Never    Alcohol/week: 0.0 standard drinks of alcohol   Drug use: Never     Family History: The patient's family history includes Asthma in her daughter and mother; Breast cancer in her maternal aunt; COPD in her mother; Colon cancer in her paternal aunt; Diabetes in her brother, daughter, and mother; Heart attack in her brother and mother; Hypertension in her brother; Kidney failure in her brother; Lung cancer in her father.  ROS:   Please see the history of present illness.    (+) RLE pain (+) Itchiness (+) Bruising All other systems reviewed and are negative.  EKGs/Labs/Other Studies Reviewed:    The following studies  were reviewed today: CTA 03/21/20 Ost 1st Diag lesion is 30% stenosed. Ost LAD to Prox LAD lesion is 10% stenosed. Ost 2nd Diag lesion is 50% stenosed. Mid LAD lesion is 40% stenosed. Prox LAD lesion is 65% stenosed. Prox Cx to Mid Cx lesion is 30% stenosed. Ost RCA to Prox RCA lesion is 20% stenosed. Ost RPDA to RPDA lesion is 20% stenosed. Mid RCA lesion is 20% stenosed. The left ventricular systolic function is normal. LV end diastolic pressure is normal. The left ventricular ejection fraction is 55-65% by visual estimate. 1.  Stable moderate one-vessel coronary artery disease with 60 to 70% stenosis in the proximal right coronary artery which is eccentric and calcified at the origin of the first diagonal.  The mid lesion seems to be better than before at 40%.  No evidence of obstructive disease. 2.  Normal LV systolic function normal left ventricular end-diastolic pressure.  Monitor 11/16/19 IMPRESSION: infrequent ectopy, brief episode  of asymptomatic SVT. Symptoms associated with SR and sinus tachycardia.   Lower Venous DVT 06/20/19 RIGHT:  - No evidence of common femoral vein obstruction.  LEFT:  - There is no evidence of deep vein thrombosis in the lower extremity.  - A cystic structure is found in the popliteal fossa extending into the  proximal medial calf, suggestive of possible ruptured Baker's cyst.   CTA 01/18/18 Ost RCA to Prox RCA lesion is 20% stenosed. Ost RPDA to RPDA lesion is 20% stenosed. Mid RCA lesion is 20% stenosed. Prox Cx to Mid Cx lesion is 30% stenosed. Prox LAD lesion is 60% stenosed. Ost LAD to Prox LAD lesion is 10% stenosed. Ost 1st Diag lesion is 30% stenosed. Mid LAD lesion is 50% stenosed. Ost 2nd Diag lesion is 50% stenosed. 1. Moderate, eccentric, calcified stenosis in the mid LAD at the takeoff of the first diagonal branch and moderate eccentric stenosis in the mid to distal LAD at the takeoff of the second diagonal branch. DFR and FFR analysis of  the mid and distal LAD suggests the lesions are not flow limiting. In multiple angiographic views, the lesions appear to be moderate only.  2. Mild non-obstructive disease in the RCA and Circumflex  Echo 01/16/18 - Procedure narrative: Transthoracic echocardiography. Image    quality was poor. The study was technically difficult, as a    result of body habitus. Intravenous contrast (Definity) was    administered.  - Left ventricle: The cavity size was normal. Wall thickness was    increased in a pattern of moderate LVH. Systolic function was    normal. The estimated ejection fraction was in the range of 55%    to 60%. Wall motion was normal; there were no regional wall    motion abnormalities. Doppler parameters are consistent with    abnormal left ventricular relaxation (grade 1 diastolic    dysfunction). The E/e&' ratio is >15, suggesting elevated LV    filling pressure.  - Mitral valve: Calcified annulus. Mildly thickened leaflets .    There was trivial regurgitation.  - Left atrium: The atrium was normal in size.  - Inferior vena cava: The vessel was normal in size. The    respirophasic diameter changes were in the normal range (>= 50%),    consistent with normal central venous pressure.  Impressions:  - Technically difficult study. Definity contrast given LVEF 55-60%,    moderate LVH, no definite regional wall motion abnormalities,    grade 1 DD, elevated LV filling pressure, trivial MR, normal LA    size, normal IVC.   EKG:  10/10/21: NSR irbbb, lvh 01/15/21: NSR, rate 65 bpm 07/09/2020: NSR, incomplete RBBB, non-specific t waves abnormality in the lateral leads, Rate: 72 bpm 12/28/2019: NSR, possible LVH  I have independently reviewed the images from cardiac catheterization performed 03/21/2020.  Recent Labs: No results found for requested labs within last 365 days.  Recent Lipid Panel    Component Value Date/Time   CHOL 130 03/22/2020 0042   TRIG 58 03/22/2020 0042   HDL  46 03/22/2020 0042   CHOLHDL 2.8 03/22/2020 0042   VLDL 12 03/22/2020 0042   LDLCALC 72 03/22/2020 0042    Physical Exam:    VS:  BP (!) 142/74   Pulse 62   Ht 5' 5.5" (1.664 m)   Wt 244 lb 9.6 oz (110.9 kg)   SpO2 98%   BMI 40.08 kg/m     Wt Readings from Last 5 Encounters:  10/10/21  244 lb 9.6 oz (110.9 kg)  07/02/21 242 lb 12.8 oz (110.1 kg)  01/15/21 240 lb 3.2 oz (109 kg)  08/01/20 248 lb (112.5 kg)  07/09/20 256 lb 3.2 oz (116.2 kg)    Constitutional: No acute distress Eyes: sclera non-icteric, normal conjunctiva and lids ENMT: normal dentition, moist mucous membranes Cardiovascular: regular rhythm, normal rate, no murmurs. S1 and S2 normal. Radial pulses normal bilaterally. No jugular venous distention.  Respiratory: clear to auscultation bilaterally GI : normal bowel sounds, soft and nontender. No distention.   MSK: extremities warm, well perfused. No edema.  NEURO: grossly nonfocal exam, moves all extremities. PSYCH: alert and oriented x 3, normal mood and affect.   ASSESSMENT:    1. Dyspnea on exertion   2. Coronary artery disease of native artery of native heart with stable angina pectoris ([place])   3. Hyperlipidemia, unspecified hyperlipidemia type   4. Essential hypertension   5. History of DVT (deep vein thrombosis)   6. Other chronic pulmonary embolism with acute cor pulmonale (HCC)   7. Syncope and collapse   8. Fatigue, unspecified type      PLAN:    DOE CAD Hyperlipidemia  Hypertension  - plan to perform cardiopulmonary exercise test for functional assessment of her DOE. If no limitation, likely deconditioning and restriction from body habitus.  - repeat echo to ensure no change in systolic or diastolic function for DOE. - will perform home sleep test to check for contribution to DOE and fatigue. -she has a moderate to severe lesion in the proximal LAD which was felt to be mostly stable on the last cardiac catheterization in January for an  admission for chest pain.  No intervention performed and medical management recommended.  Continue Xarelto 20 mg daily, atorvastatin 40 mg daily.  Continue isosorbide mononitrate 60 mg daily.  Patient has previously been intolerant to beta-blockers.  For statin intolerance, we tried repatha but she feels she was intolerant. Not interested in [place]. Discussed inclisiran, she will consider.  History of DVT and PE-continue Xarelto.  No bleeding events. Recommend compression socks.  Syncope-no interval syncope since last visit.  Total time of encounter: 30 minutes total time of encounter, including 25 minutes spent in face-to-face patient care on the date of this encounter. This time includes coordination of care and counseling regarding above mentioned problem list. Remainder of non-face-to-face time involved reviewing chart documents/testing relevant to the patient encounter and documentation in the medical record. I have independently reviewed documentation from referring provider.   Cherlynn Kaiser, MD, [facility]    Medication Adjustments/Labs and Tests Ordered: Current medicines are reviewed at length with the patient today.  Concerns regarding medicines are outlined above.   Orders Placed This Encounter  Procedures   Cardiopulmonary exercise test   EKG 12-Lead   ECHOCARDIOGRAM COMPLETE   Home sleep test    No orders of the defined types were placed in this encounter.    Patient Instructions  Medication Instructions:  No Changes In Medications at this time.  *If you need a refill on your cardiac medications before your next appointment, please call your pharmacy*  Lab Work: None Ordered At This Time.  If you have labs (blood work) drawn today and your tests are completely normal, you will receive your results only by: [facility] (if you have MyChart) OR A paper copy in the mail If you have any lab test that is abnormal or we need to change your  treatment,  we will call you to review the results.  Testing/Procedures: Your physician has recommended that you have a cardiopulmonary stress test (CPX). CPX testing is a non-invasive measurement of heart and lung function. It replaces a traditional treadmill stress test. This type of test provides a tremendous amount of information that relates not only to your present condition but also for future outcomes. This test combines measurements of you ventilation, respiratory gas exchange in the lungs, electrocardiogram (EKG), blood pressure and physical response before, during, and following an exercise protocol.  Your physician has requested that you have an echocardiogram. Echocardiography is a painless test that uses sound waves to create images of your heart. It provides your doctor with information about the size and shape of your heart and how well your heart's chambers and valves are working. You may receive an ultrasound enhancing agent through an IV if needed to better visualize your heart during the echo.This procedure takes approximately one hour. There are no restrictions for this procedure. This will take place at the [address], [address].   Your physician has recommended that you have a sleep study. This test records several body functions during sleep, including: brain activity, eye movement, oxygen and carbon dioxide blood levels, heart rate and rhythm, breathing rate and rhythm, the flow of air through your mouth and nose, snoring, body muscle movements, and chest and belly movement. SOMEONE WILL REACH OUT TO GET YOU SCHEDULED FOR THIS  Follow-Up: At [facility], you and your health needs are our priority.  As part of our continuing mission to provide you with exceptional heart care, we have created designated Provider Care Teams.  These Care Teams include your primary Cardiologist (physician) and Advanced Practice Providers (APPs -  Physician Assistants and Nurse Practitioners)  who all work together to provide you with the care you need, when you need it.  Your next appointment:   SEPTEMBER 28th at 1:40PM  The format for your next appointment:   In Person  Provider:   Elouise Munroe, MD

## 2021-10-14 ENCOUNTER — Ambulatory Visit (HOSPITAL_COMMUNITY): Payer: 59 | Attending: Internal Medicine

## 2021-10-14 DIAGNOSIS — R0609 Other forms of dyspnea: Secondary | ICD-10-CM

## 2021-10-14 DIAGNOSIS — R0600 Dyspnea, unspecified: Secondary | ICD-10-CM

## 2021-10-23 ENCOUNTER — Encounter (INDEPENDENT_AMBULATORY_CARE_PROVIDER_SITE_OTHER): Payer: Self-pay

## 2021-10-29 ENCOUNTER — Ambulatory Visit (HOSPITAL_COMMUNITY): Payer: 59

## 2021-10-29 ENCOUNTER — Telehealth (HOSPITAL_COMMUNITY): Payer: Self-pay | Admitting: Internal Medicine

## 2021-10-29 NOTE — Telephone Encounter (Signed)
Patient called and cancelled echocardiogram due to she was sick. She did not wish to reschedule a this time. Order will be removed from the echo WQ.  If patient calls back to reschedule we will reinstate the order.

## 2021-11-19 ENCOUNTER — Ambulatory Visit (HOSPITAL_COMMUNITY): Payer: 59 | Attending: Internal Medicine

## 2021-11-19 DIAGNOSIS — R0609 Other forms of dyspnea: Secondary | ICD-10-CM | POA: Insufficient documentation

## 2021-11-19 LAB — ECHOCARDIOGRAM COMPLETE
Area-P 1/2: 3.27 cm2
S' Lateral: 3.7 cm

## 2021-11-19 MED ORDER — PERFLUTREN LIPID MICROSPHERE
1.0000 mL | INTRAVENOUS | Status: AC | PRN
  Administered 2021-11-19: 1 mL via INTRAVENOUS

## 2021-11-20 ENCOUNTER — Ambulatory Visit (HOSPITAL_BASED_OUTPATIENT_CLINIC_OR_DEPARTMENT_OTHER): Payer: 59 | Admitting: Cardiology

## 2021-12-12 ENCOUNTER — Ambulatory Visit: Payer: 59 | Attending: Internal Medicine | Admitting: Internal Medicine

## 2021-12-12 ENCOUNTER — Encounter: Payer: Self-pay | Admitting: Internal Medicine

## 2021-12-12 VITALS — BP 128/62 | HR 62 | Ht 65.5 in | Wt 242.0 lb

## 2021-12-12 DIAGNOSIS — E785 Hyperlipidemia, unspecified: Secondary | ICD-10-CM

## 2021-12-12 DIAGNOSIS — Z6839 Body mass index (BMI) 39.0-39.9, adult: Secondary | ICD-10-CM

## 2021-12-12 DIAGNOSIS — I10 Essential (primary) hypertension: Secondary | ICD-10-CM

## 2021-12-12 DIAGNOSIS — R5383 Other fatigue: Secondary | ICD-10-CM

## 2021-12-12 DIAGNOSIS — I25118 Atherosclerotic heart disease of native coronary artery with other forms of angina pectoris: Secondary | ICD-10-CM | POA: Diagnosis not present

## 2021-12-12 DIAGNOSIS — R0609 Other forms of dyspnea: Secondary | ICD-10-CM

## 2021-12-12 DIAGNOSIS — I2609 Other pulmonary embolism with acute cor pulmonale: Secondary | ICD-10-CM

## 2021-12-12 DIAGNOSIS — Z86718 Personal history of other venous thrombosis and embolism: Secondary | ICD-10-CM

## 2021-12-12 DIAGNOSIS — R55 Syncope and collapse: Secondary | ICD-10-CM

## 2021-12-12 DIAGNOSIS — I2782 Chronic pulmonary embolism: Secondary | ICD-10-CM

## 2021-12-12 NOTE — Progress Notes (Signed)
Cardiology Office Note:    Date:  12/12/2021   ID:  Diane Barker, DOB [birth year], MRN [id number]  PCP:  Shon Baton, MD  Cardiologist:  Elouise Munroe, MD  Electrophysiologist:  None   Referring MD: Shon Baton, MD   Chief Complaint/Reason for Referral: CAD  History of Present Illness:    Diane Barker is a 71 y.o. female with a history of coronary artery disease with stable angina coming in today for follow-up.   She was hospitalized in early January 2022 for chest pain with a known proximal LAD lesion.  She was felt to have stable moderate one-vessel coronary artery disease with 60 to 70% stenosis of the proximal LAD.  Felt to have slight progression of disease but not felt to be enough to cause unstable angina.  Focus was on medical management of CAD.    She did not tolerate repatha unfortunately, though may have had concomitant covid 19 infection with a positive test at that time, she does not feel that the infection caused her to feel unwell and feels it was related to repatha.  At her last appointment she continued to have DOE. I have reviewed her prior PFTs which suggested exercise PFTS may be helpful and results of routine PFT showed: "Conclusions: The reduced lung volumes, increased FEV1/FVC ratio and diffusion defect suggest an interstitial process such as fibrosis or interstitial inflammation. Anemia cannot be excluded as a potential cause of the diffusion defect without correcting the observed diffusing capacity for hemoglobin. In view of the severity of the diffusion defect, studies with exercise would be helpful to evaluate the presence of hypoxemia."  DOE had previously been one of her anginal symptoms, in addition to chest pressure with left arm radiation. We discussed functional testing given recent cath in 2022.  Planned to perform cardiopulmonary exercise test for functional assessment of her DOE, and a repeat echo was ordered to ensure no change in systolic or  diastolic function. Cardiopulmonary exercise test on 10/14/21 demonstrated normal functional capacity. Her Echo 11/19/21 showed LVEF 55-60% and indeterminate diastolic parameters. We also discussed inclisiran, which she was going to consider.  Today, she complains of her left knee "locking up on her" while moving in and out of bed. She notes that she has received a therapeutic injection for this issue previously.   For exercise she takes her dog out for walks, usually for 1/2 a block. Afterwards she feels fatigued and short of breath. She may also need to stop and rest for a time. We reviewed results of CPET which was most suggestive of deconditioning, mild chronotropic incompetance and obesity related restrictive lung physiology.   Additionally she is concerned that she feels very fatigued constantly and wants to sleep throughout the day. She reports getting up at night often to pass urine but mostly to care for her dog. She does not usually snore, however last night was the first time she woke herself up from snoring. Has not been screened for osa.   She denies any palpitations, chest pain, or peripheral edema. No lightheadedness, headaches, syncope, orthopnea, or PND.   Past Medical History:  Diagnosis Date   Anxiety    Arthritis    "all over my body" (01/15/2018)   Breast cancer, right breast ([place]) 2000   Rt lumpectomy, Chemo/XRT/rt axillary node    Bronchial asthma    Chronic lower back pain    Depression    Diverticulosis    DVT (deep venous thrombosis) ([place]) 06/2013  RLE   GERD (gastroesophageal reflux disease)    High cholesterol    Hx of adenomatous colonic polyps    Hypertension    Obesity    Osteoarthritis    Personal history of chemotherapy    Personal history of radiation therapy    Pulmonary embolism ([place]) 06/2013   "both lungs"   Spondylolisthesis     Past Surgical History:  Procedure Laterality Date   BREAST BIOPSY Right 2000   BREAST LUMPECTOMY Right 2000   for  CA txed w/ chemo and radiation, right   CARDIAC CATHETERIZATION  03/21/2020   HERNIA REPAIR     INTRAVASCULAR PRESSURE WIRE/FFR STUDY N/A 01/18/2018   Procedure: INTRAVASCULAR PRESSURE WIRE/FFR STUDY;  Surgeon: Burnell Blanks, MD;  Location: [facility];  Service: Cardiovascular;  Laterality: N/A;   LEFT HEART CATH AND CORONARY ANGIOGRAPHY N/A 01/18/2018   Procedure: LEFT HEART CATH AND CORONARY ANGIOGRAPHY;  Surgeon: Burnell Blanks, MD;  Location: [facility];  Service: Cardiovascular;  Laterality: N/A;   LEFT HEART CATH AND CORONARY ANGIOGRAPHY N/A 03/21/2020   Procedure: LEFT HEART CATH AND CORONARY ANGIOGRAPHY;  Surgeon: Wellington Hampshire, MD;  Location: [facility];  Service: Cardiovascular;  Laterality: N/A;   TUBAL LIGATION     UMBILICAL HERNIA REPAIR  1983    Current Medications: Outpatient Medications Prior to Visit  Medication Sig Dispense Refill   acetaminophen (TYLENOL) 500 MG tablet Take 500 mg by mouth every 6 (six) hours as needed for mild pain.     atorvastatin (LIPITOR) 40 MG tablet TAKE 1 TABLET BY MOUTH EVERY DAY 90 tablet 3   Budeson-Glycopyrrol-Formoterol (BREZTRI AEROSPHERE) 160-9-4.8 MCG/ACT AERO Inhale 2 puffs into the lungs in the morning and at bedtime. 5.9 g 0   Cholecalciferol (VITAMIN D) 2000 UNITS CAPS Take 4,000 Units by mouth daily.      ciclopirox (PENLAC) 8 % solution APPLY TOPICALLY AT BEDTIME. APPLY OVER NAIL AND SURROUNDING SKIN. APPLY DAILY OVER PREVIOUS COAT. AFTER SEVEN (7) DAYS, MAY REMOVE WITH ALCOHOL AND CONTINUE CYCLE. 6.6 mL 0   clotrimazole-betamethasone (LOTRISONE) cream Apply 1 application topically 2 (two) times daily. 30 g 0   dexlansoprazole (DEXILANT) 60 MG capsule Take 60 mg by mouth daily.     escitalopram (LEXAPRO) 10 MG tablet Take 10 mg by mouth at bedtime.     isosorbide mononitrate (IMDUR) 60 MG 24 hr tablet Take 1 tablet (60 mg total) by mouth daily. 90 tablet 1   nitroGLYCERIN (NITROSTAT) 0.4 MG  SL tablet Place 1 tablet (0.4 mg total) under the tongue every 5 (five) minutes as needed. 25 tablet 3   PROAIR HFA 108 (90 Base) MCG/ACT inhaler INHALE 2 PUFFS INTO THE LUNGS EVERY 4 (FOUR) HOURS AS NEEDED FOR WHEEZING OR SHORTNESS OF BREATH. 8.5 each 12   rivaroxaban (XARELTO) 20 MG TABS tablet Take 20 mg by mouth daily with supper.     Spacer/Aero-Holding Chambers (AEROCHAMBER MV) inhaler Use as instructed 1 each 0   vitamin B-12 (CYANOCOBALAMIN) 500 MCG tablet Take 500 mcg by mouth daily.     No facility-administered medications prior to visit.     Allergies:   Cefdinir, Erythromycin, Erythromycin, Gabapentin, Gabapentin, Lipitor [atorvastatin], Morphine, Morphine and related, Penicillins, Penicillins, and Relafen [nabumetone]   Social History   Tobacco Use   Smoking status: Never   Smokeless tobacco: Never  Vaping Use   Vaping Use: Never used  Substance Use Topics   Alcohol use: Never  Alcohol/week: 0.0 standard drinks of alcohol   Drug use: Never     Family History: The patient's family history includes Asthma in her daughter and mother; Breast cancer in her maternal aunt; COPD in her mother; Colon cancer in her paternal aunt; Diabetes in her brother, daughter, and mother; Heart attack in her brother and mother; Hypertension in her brother; Kidney failure in her brother; Lung cancer in her father.  ROS:   Please see the history of present illness.    (+) Left knee "locking" (+) Shortness of breath (+) Fatigue (+) Daytime somnolence All other systems reviewed and are negative.  EKGs/Labs/Other Studies Reviewed:    The following studies were reviewed today:  Echo 11/19/2021: Sonographer Comments: Technically difficult study due to poor echo windows and suboptimal apical window. Image acquisition challenging due to patient body habitus.   IMPRESSIONS   1. Left ventricular ejection fraction, by estimation, is 55 to 60%. The  left ventricle has normal function. The left  ventricle has no regional  wall motion abnormalities. Left ventricular diastolic parameters are  indeterminate.   2. Right ventricular systolic function is normal. The right ventricular  size is normal. Tricuspid regurgitation signal is inadequate for assessing  PA pressure.   3. Left atrial size was moderately dilated.   4. The mitral valve is degenerative. Mild mitral valve regurgitation. No  evidence of mitral stenosis.   5. The aortic valve is tricuspid. Aortic valve regurgitation is trivial.  No aortic stenosis is present.   Comparison(s): No significant change from prior study. 01/16/18 EF 55-60%.   Cardiopulmonary Exercise Test 10/14/2021: Conclusion: Exercise testing with gas exchange demonstrates normal functional capacity when compared to matched sedentary norms. There is no indication for cardiopulmonary abnormality. Patient appears primarily ventilatory limited due to body habitus.   Bilateral LE Venous Doppler 01/28/2021: Summary:  BILATERAL:  - No evidence of deep vein thrombosis seen in the lower extremities,  bilaterally.  - No evidence of superficial venous thrombosis in the lower extremities,  bilaterally.  -No evidence of popliteal cyst, bilaterally.  RIGHT:  - Findings consistent with chronic superficial vein thrombosis involving  the right small saphenous vein.  CTA 03/21/20 Ost 1st Diag lesion is 30% stenosed. Ost LAD to Prox LAD lesion is 10% stenosed. Ost 2nd Diag lesion is 50% stenosed. Mid LAD lesion is 40% stenosed. Prox LAD lesion is 65% stenosed. Prox Cx to Mid Cx lesion is 30% stenosed. Ost RCA to Prox RCA lesion is 20% stenosed. Ost RPDA to RPDA lesion is 20% stenosed. Mid RCA lesion is 20% stenosed. The left ventricular systolic function is normal. LV end diastolic pressure is normal. The left ventricular ejection fraction is 55-65% by visual estimate. 1.  Stable moderate one-vessel coronary artery disease with 60 to 70% stenosis in the proximal  right coronary artery which is eccentric and calcified at the origin of the first diagonal.  The mid lesion seems to be better than before at 40%.  No evidence of obstructive disease. 2.  Normal LV systolic function normal left ventricular end-diastolic pressure.  Monitor 11/16/19 IMPRESSION: infrequent ectopy, brief episode of asymptomatic SVT. Symptoms associated with SR and sinus tachycardia.   Lower Venous DVT 06/20/19 RIGHT:  - No evidence of common femoral vein obstruction.  LEFT:  - There is no evidence of deep vein thrombosis in the lower extremity.  - A cystic structure is found in the popliteal fossa extending into the  proximal medial calf, suggestive of possible ruptured Baker's cyst.  CTA 01/18/18 Ost RCA to Prox RCA lesion is 20% stenosed. Ost RPDA to RPDA lesion is 20% stenosed. Mid RCA lesion is 20% stenosed. Prox Cx to Mid Cx lesion is 30% stenosed. Prox LAD lesion is 60% stenosed. Ost LAD to Prox LAD lesion is 10% stenosed. Ost 1st Diag lesion is 30% stenosed. Mid LAD lesion is 50% stenosed. Ost 2nd Diag lesion is 50% stenosed. 1. Moderate, eccentric, calcified stenosis in the mid LAD at the takeoff of the first diagonal branch and moderate eccentric stenosis in the mid to distal LAD at the takeoff of the second diagonal branch. DFR and FFR analysis of the mid and distal LAD suggests the lesions are not flow limiting. In multiple angiographic views, the lesions appear to be moderate only.  2. Mild non-obstructive disease in the RCA and Circumflex  Echo 01/16/18 - Procedure narrative: Transthoracic echocardiography. Image    quality was poor. The study was technically difficult, as a    result of body habitus. Intravenous contrast (Definity) was    administered.  - Left ventricle: The cavity size was normal. Wall thickness was    increased in a pattern of moderate LVH. Systolic function was    normal. The estimated ejection fraction was in the range of 55%    to 60%.  Wall motion was normal; there were no regional wall    motion abnormalities. Doppler parameters are consistent with    abnormal left ventricular relaxation (grade 1 diastolic    dysfunction). The E/e&' ratio is >15, suggesting elevated LV    filling pressure.  - Mitral valve: Calcified annulus. Mildly thickened leaflets .    There was trivial regurgitation.  - Left atrium: The atrium was normal in size.  - Inferior vena cava: The vessel was normal in size. The    respirophasic diameter changes were in the normal range (>= 50%),    consistent with normal central venous pressure.  Impressions:  - Technically difficult study. Definity contrast given LVEF 55-60%,    moderate LVH, no definite regional wall motion abnormalities,    grade 1 DD, elevated LV filling pressure, trivial MR, normal LA    size, normal IVC.   EKG: EKG is personally reviewed. 12/12/2021: EKG was not ordered.  10/10/21: NSR irbbb, lvh 01/15/21: NSR, rate 65 bpm 07/09/2020: NSR, incomplete RBBB, non-specific t waves abnormality in the lateral leads, Rate: 72 bpm 12/28/2019: NSR, possible LVH  I have independently reviewed the images from cardiac catheterization performed 03/21/2020.  Recent Labs: No results found for requested labs within last 365 days.  Recent Lipid Panel    Component Value Date/Time   CHOL 130 03/22/2020 0042   TRIG 58 03/22/2020 0042   HDL 46 03/22/2020 0042   CHOLHDL 2.8 03/22/2020 0042   VLDL 12 03/22/2020 0042   LDLCALC 72 03/22/2020 0042    Physical Exam:    VS:  BP 128/62 (BP Location: Left Arm, Patient Position: Sitting, Cuff Size: Large)   Pulse 62   Ht 5' 5.5" (1.664 m)   Wt 242 lb (109.8 kg)   BMI 39.66 kg/m     Wt Readings from Last 5 Encounters:  12/12/21 242 lb (109.8 kg)  10/10/21 244 lb 9.6 oz (110.9 kg)  07/02/21 242 lb 12.8 oz (110.1 kg)  01/15/21 240 lb 3.2 oz (109 kg)  08/01/20 248 lb (112.5 kg)    Constitutional: No acute distress Eyes: sclera non-icteric, normal  conjunctiva and lids ENMT: normal dentition, moist mucous membranes Cardiovascular: regular  rhythm, normal rate, no murmurs. S1 and S2 normal.  No jugular venous distention.  Respiratory: clear to auscultation bilaterally GI : normal bowel sounds, soft and nontender. No distention.   MSK: extremities warm, well perfused. No edema.  NEURO: grossly nonfocal exam, moves all extremities. PSYCH: alert and oriented x 3, normal mood and affect.   ASSESSMENT:    1. BMI 39.0-39.9,adult   2. Dyspnea on exertion   3. Coronary artery disease of native artery of native heart with stable angina pectoris ([place])   4. Hyperlipidemia, unspecified hyperlipidemia type   5. Essential hypertension   6. History of DVT (deep vein thrombosis)   7. Other chronic pulmonary embolism with acute cor pulmonale (HCC)   8. Syncope and collapse   9. Fatigue, unspecified type       PLAN:    DOE CAD Hyperlipidemia  Hypertension  - CPET showed obesity related restriction of lung physiology, deconditioning and mild chronotropic incompetance - repeat echo unchanged.  - recommend sleep study for DOE and fatigue. -she has a moderate to severe lesion in the proximal LAD which was felt to be mostly stable on the last cardiac catheterization in January 2022 for an admission for chest pain.  No intervention performed and medical management recommended.  Continue Xarelto 20 mg daily, atorvastatin 40 mg daily.  Continue isosorbide mononitrate 60 mg daily.  Patient has previously been intolerant to beta-blockers.  For statin intolerance, we tried repatha but she feels she was intolerant. Not interested in [place]. Discussed inclisiran, she will consider.  History of DVT and PE-continue Xarelto.  No bleeding events. Recommend compression socks.  Syncope-no interval syncope since last visit.  Obesity -complicating symptoms above. Recommend referral to PREP.   Follow-up in 6 months.  Total time of encounter: 30 minutes  total time of encounter, including 20 minutes spent in face-to-face patient care on the date of this encounter. This time includes coordination of care and counseling regarding above mentioned problem list. Remainder of non-face-to-face time involved reviewing chart documents/testing relevant to the patient encounter and documentation in the medical record. I have independently reviewed documentation from referring provider.   Cherlynn Kaiser, MD, [facility]    Medication Adjustments/Labs and Tests Ordered: Current medicines are reviewed at length with the patient today.  Concerns regarding medicines are outlined above.   Orders Placed This Encounter  Procedures   Amb Referral To Provider Referral Exercise Program (P.R.E.P)   No orders of the defined types were placed in this encounter.  Patient Instructions  Medication Instructions:  No Changes In Medications at this time.  *If you need a refill on your cardiac medications before your next appointment, please call your pharmacy*  Testing/Procedures: None Ordered At This Time.   Follow-Up: At [facility], you and your health needs are our priority.  As part of our continuing mission to provide you with exceptional heart care, we have created designated Provider Care Teams.  These Care Teams include your primary Cardiologist (physician) and Advanced Practice Providers (APPs -  Physician Assistants and Nurse Practitioners) who all work together to provide you with the care you need, when you need it.  Your next appointment:   6 month(s)  The format for your next appointment:   In Person  Provider:   Elouise Munroe, MD    [place]            I,Mathew Stumpf,acting  as a scribe for Elouise Munroe, MD.,have documented all relevant documentation on the behalf of Elouise Munroe, MD,as directed by  Elouise Munroe, MD while in  the presence of Elouise Munroe, MD.   I, Elouise Munroe, MD, have reviewed all documentation for the visit on 12/12/2021. The documentation on today's date of service for the exam, diagnosis, procedures, and orders are all accurate and complete.

## 2021-12-12 NOTE — Patient Instructions (Signed)
Medication Instructions:  No Changes In Medications at this time.  *If you need a refill on your cardiac medications before your next appointment, please call your pharmacy*  Testing/Procedures: None Ordered At This Time.   Follow-Up: At [facility], you and your health needs are our priority.  As part of our continuing mission to provide you with exceptional heart care, we have created designated Provider Care Teams.  These Care Teams include your primary Cardiologist (physician) and Advanced Practice Providers (APPs -  Physician Assistants and Nurse Practitioners) who all work together to provide you with the care you need, when you need it.  Your next appointment:   6 month(s)  The format for your next appointment:   In Person  Provider:   Elouise Munroe, MD    [place]

## 2021-12-17 ENCOUNTER — Telehealth: Payer: Self-pay

## 2021-12-17 NOTE — Telephone Encounter (Signed)
Call to pt reference PREP referral Explained program to pt Home schools 71 year old granddaughter Interested in program but would need to bring her with her-she needs program also. Will discuss with leadership reference age Can do [id number] and could start 02/11/22 On T/TH Will call her closer to start of class for intake

## 2021-12-19 ENCOUNTER — Other Ambulatory Visit (HOSPITAL_COMMUNITY): Payer: 59

## 2021-12-19 ENCOUNTER — Other Ambulatory Visit: Payer: Self-pay | Admitting: Internal Medicine

## 2021-12-22 ENCOUNTER — Ambulatory Visit (HOSPITAL_COMMUNITY)
Admission: EM | Admit: 2021-12-22 | Discharge: 2021-12-22 | Disposition: A | Discharge status: Home / Self Care | Payer: 59 | Attending: Physician Assistant | Admitting: Physician Assistant

## 2021-12-22 ENCOUNTER — Encounter (HOSPITAL_COMMUNITY): Payer: Self-pay

## 2021-12-22 DIAGNOSIS — M62830 Muscle spasm of back: Secondary | ICD-10-CM

## 2021-12-22 MED ORDER — BACLOFEN 10 MG PO TABS: 10.0000 mg | ORAL_TABLET | Freq: Three times a day (TID) | ORAL | 0 refills | Status: DC | PRN

## 2021-12-22 NOTE — Discharge Instructions (Addendum)
Can take muscle relaxer as needed for muscle spasm.  Recommend ice to affected are and gentle stretching Follow up with primary care physician if no improvement

## 2021-12-22 NOTE — ED Provider Notes (Signed)
[place]    CSN: [id number] Arrival date & time: 12/22/21  1554      History   Chief Complaint Chief Complaint  Patient presents with   Back Pain    HPI Diane Barker is a 71 y.o. female.   Pt complains of left sided lower and mid back pain after she reports falling several days ago. She reports she was walking her dog when she tripped over a large rock, falling, landing on her left side.  Reports no pain immediately after the fall, but now she is experiencing intermittent left mid and lower back pain.  Pain is worse with movement.  She had trouble sleeping last night due to pain.  She denies radiation of pain.  She denies headache, nausea, vomiting.  She has taken nothing for the sx.     Past Medical History:  Diagnosis Date   Anxiety    Arthritis    "all over my body" (01/15/2018)   Breast cancer, right breast ([place]) 2000   Rt lumpectomy, Chemo/XRT/rt axillary node    Bronchial asthma    Chronic lower back pain    Depression    Diverticulosis    DVT (deep venous thrombosis) ([place]) 06/2013   RLE   GERD (gastroesophageal reflux disease)    High cholesterol    Hx of adenomatous colonic polyps    Hypertension    Obesity    Osteoarthritis    Personal history of chemotherapy    Personal history of radiation therapy    Pulmonary embolism ([place]) 06/2013   "both lungs"   Spondylolisthesis     Patient Active Problem List   Diagnosis Date Noted   Hyperlipidemia 08/01/2020   AB (asthmatic bronchitis), mild persistent, with acute exacerbation    Unstable angina ([place]) 01/16/2018   Primary osteoarthritis of both knees 07/29/2017   Cellulitis of right arm 10/07/2016   Sepsis ([place]) 10/07/2016   Chest pain 10/07/2016   Dyspnea on exertion 05/24/2015   DVT, lower extremity ([place]) 05/09/2014   Right calf pain 06/21/2013   Pulmonary embolism ([place]) 06/21/2013   Dysphagia 06/27/2011   Cough 06/27/2011   CHEST PAIN 01/18/2009   RHINITIS 04/13/2007   Obstructive  chronic bronchitis with acute bronchitis ([place]) 04/13/2007   GERD 04/13/2007   Depression 01/21/2007   OSTEOARTHRITIS 01/21/2007   LOW BACK PAIN 01/21/2007   BREAST CANCER, HX OF 01/21/2007    Past Surgical History:  Procedure Laterality Date   BREAST BIOPSY Right 2000   BREAST LUMPECTOMY Right 2000   for CA txed w/ chemo and radiation, right   CARDIAC CATHETERIZATION  03/21/2020   HERNIA REPAIR     INTRAVASCULAR PRESSURE WIRE/FFR STUDY N/A 01/18/2018   Procedure: INTRAVASCULAR PRESSURE WIRE/FFR STUDY;  Surgeon: Burnell Blanks, MD;  Location: [facility];  Service: Cardiovascular;  Laterality: N/A;   LEFT HEART CATH AND CORONARY ANGIOGRAPHY N/A 01/18/2018   Procedure: LEFT HEART CATH AND CORONARY ANGIOGRAPHY;  Surgeon: Burnell Blanks, MD;  Location: [facility];  Service: Cardiovascular;  Laterality: N/A;   LEFT HEART CATH AND CORONARY ANGIOGRAPHY N/A 03/21/2020   Procedure: LEFT HEART CATH AND CORONARY ANGIOGRAPHY;  Surgeon: Wellington Hampshire, MD;  Location: [facility];  Service: Cardiovascular;  Laterality: N/A;   TUBAL LIGATION     UMBILICAL HERNIA REPAIR  1983    OB History   No obstetric history on file.      Home Medications    Prior to Admission  medications   Medication Sig Start Date End Date Taking? Authorizing Provider  baclofen (LIORESAL) 10 MG tablet Take 1 tablet (10 mg total) by mouth 3 (three) times daily as needed for muscle spasms (can take one tablet by mouth every 8 hours as needed for muscle spasm.). 12/22/21  Yes Ward, Lenise Arena, PA-C  acetaminophen (TYLENOL) 500 MG tablet Take 500 mg by mouth every 6 (six) hours as needed for mild pain.    [provider]  atorvastatin (LIPITOR) 40 MG tablet TAKE 1 TABLET BY MOUTH EVERY DAY 06/05/21   Elouise Munroe, MD  Budeson-Glycopyrrol-Formoterol (BREZTRI AEROSPHERE) 160-9-4.8 MCG/ACT AERO Inhale 2 puffs into the lungs in the morning and at bedtime. 06/22/20   Baird Lyons  D, MD  Cholecalciferol (VITAMIN D) 2000 UNITS CAPS Take 4,000 Units by mouth daily.     [provider]  ciclopirox (PENLAC) 8 % solution APPLY TOPICALLY AT BEDTIME. APPLY OVER NAIL AND SURROUNDING SKIN. APPLY DAILY OVER PREVIOUS COAT. AFTER SEVEN (7) DAYS, MAY REMOVE WITH ALCOHOL AND CONTINUE CYCLE. 02/27/21   Trula Slade, DPM  clotrimazole-betamethasone (LOTRISONE) cream Apply 1 application topically 2 (two) times daily. 08/07/20   Trula Slade, DPM  dexlansoprazole (DEXILANT) 60 MG capsule Take 60 mg by mouth daily.    [provider]  escitalopram (LEXAPRO) 10 MG tablet Take 10 mg by mouth at bedtime.    [provider]  isosorbide mononitrate (IMDUR) 60 MG 24 hr tablet Take 1 tablet (60 mg total) by mouth daily. 05/17/21   Elouise Munroe, MD  nitroGLYCERIN (NITROSTAT) 0.4 MG SL tablet Place 1 tablet (0.4 mg total) under the tongue every 5 (five) minutes as needed. 07/06/19   Elouise Munroe, MD  PROAIR HFA 108 [id number] Base) MCG/ACT inhaler INHALE 2 PUFFS INTO THE LUNGS EVERY 4 (FOUR) HOURS AS NEEDED FOR WHEEZING OR SHORTNESS OF BREATH. 10/14/20   Deneise Lever, MD  rivaroxaban (XARELTO) 20 MG TABS tablet Take 20 mg by mouth daily with supper.    [provider]  Spacer/Aero-Holding Chambers (AEROCHAMBER MV) inhaler Use as instructed 07/30/18   Deneise Lever, MD  vitamin B-12 (CYANOCOBALAMIN) 500 MCG tablet Take 500 mcg by mouth daily.    [provider]    Family History Family History  Problem Relation Age of Onset   Asthma Mother    COPD Mother    Heart attack Mother    Diabetes Mother    Lung cancer Father    Asthma Daughter    Kidney failure Brother    Diabetes Brother        x 2   Heart attack Brother    Breast cancer Maternal Aunt    Colon cancer Paternal Aunt    Diabetes Daughter        x 2   Hypertension Brother     Social History Social History   Tobacco Use   Smoking status: Never   Smokeless tobacco:  Never  Vaping Use   Vaping Use: Never used  Substance Use Topics   Alcohol use: Never    Alcohol/week: 0.0 standard drinks of alcohol   Drug use: Never     Allergies   Cefdinir, Erythromycin, Erythromycin, Gabapentin, Gabapentin, Lipitor [atorvastatin], Morphine, Morphine and related, Penicillins, Penicillins, and Relafen [nabumetone]   Review of Systems Review of Systems  Constitutional:  Negative for chills and fever.  HENT:  Negative for ear pain and sore throat.   Eyes:  Negative for pain and  visual disturbance.  Respiratory:  Negative for cough and shortness of breath.   Cardiovascular:  Negative for chest pain and palpitations.  Gastrointestinal:  Negative for abdominal pain and vomiting.  Genitourinary:  Negative for dysuria and hematuria.  Musculoskeletal:  Positive for back pain. Negative for arthralgias.  Skin:  Negative for color change and rash.  Neurological:  Negative for seizures and syncope.  All other systems reviewed and are negative.    Physical Exam Triage Vital Signs ED Triage Vitals  Enc Vitals Group     BP 12/22/21 1633 (!) 149/81     Pulse Rate 12/22/21 1633 (!) 57     Resp 12/22/21 1633 12     Temp 12/22/21 1633 (!) 97.5 F (36.4 C)     Temp Source 12/22/21 1633 Oral     SpO2 12/22/21 1633 96 %     Weight --      Height --      Head Circumference --      Peak Flow --      Pain Score 12/22/21 1636 8     Pain Loc --      Pain Edu? --      Excl. in [place]? --    No data found.  Updated Vital Signs BP (!) 149/81 (BP Location: Left Arm)   Pulse (!) 57   Temp (!) 97.5 F (36.4 C) (Oral)   Resp 12   SpO2 96%   Visual Acuity Right Eye Distance:   Left Eye Distance:   Bilateral Distance:    Right Eye Near:   Left Eye Near:    Bilateral Near:     Physical Exam Vitals and nursing note reviewed.  Constitutional:      General: She is not in acute distress.    Appearance: She is well-developed.  HENT:     Head: Normocephalic and  atraumatic.  Eyes:     Conjunctiva/sclera: Conjunctivae normal.  Cardiovascular:     Rate and Rhythm: Normal rate and regular rhythm.     Heart sounds: No murmur heard. Pulmonary:     Effort: Pulmonary effort is normal. No respiratory distress.     Breath sounds: Normal breath sounds.  Abdominal:     Palpations: Abdomen is soft.     Tenderness: There is no abdominal tenderness.  Musculoskeletal:        General: No swelling.     Cervical back: Neck supple.       Back:  Skin:    General: Skin is warm and dry.     Capillary Refill: Capillary refill takes less than 2 seconds.  Neurological:     Mental Status: She is alert.  Psychiatric:        Mood and Affect: Mood normal.      UC Treatments / Results  Labs (all labs ordered are listed, but only abnormal results are displayed) Labs Reviewed - No data to display  EKG   Radiology No results found.  Procedures Procedures (including critical care time)  Medications Ordered in UC Medications - No data to display  Initial Impression / Assessment and Plan / UC Course  I have reviewed the triage vital signs and the nursing notes.  Pertinent labs & imaging results that were available during my care of the patient were reviewed by me and considered in my medical decision making (see chart for details).     Lumbar muscular spasm.  Baclofen prescribed.  Supportive care discussed. No radiation of sx, no radicular  sx.  PCP follow up recommended.  Final Clinical Impressions(s) / UC Diagnoses   Final diagnoses:  Back muscle spasm     Discharge Instructions      Can take muscle relaxer as needed for muscle spasm.  Recommend ice to affected are and gentle stretching Follow up with primary care physician if no improvement    ED Prescriptions     Medication Sig Dispense Auth. Provider   baclofen (LIORESAL) 10 MG tablet Take 1 tablet (10 mg total) by mouth 3 (three) times daily as needed for muscle spasms (can take one  tablet by mouth every 8 hours as needed for muscle spasm.). 30 each Ward, Lenise Arena, PA-C      PDMP not reviewed this encounter.   Ward, Lenise Arena, PA-C 12/22/21 1700

## 2021-12-22 NOTE — ED Triage Notes (Signed)
Pt fell few days ago and hit head. Pt denies passing out. Pt states 2 days ago she started to feel pain in the back .

## 2021-12-30 ENCOUNTER — Encounter (HOSPITAL_BASED_OUTPATIENT_CLINIC_OR_DEPARTMENT_OTHER): Payer: 59 | Admitting: Cardiovascular Disease

## 2022-01-02 ENCOUNTER — Ambulatory Visit: Payer: 59 | Admitting: Internal Medicine

## 2022-01-25 NOTE — Progress Notes (Unsigned)
Patient ID: Diane Barker, female    DOB: 1951-02-07, 71 y.o.   MRN: [id number]  HPI F never smoker followed for bronchitis with hx rhinitis, GERD, hx breast cancer, Hx DVT/ PE/ Xarelto. Office Spirometry 04/30/2015- WNL- FVC 2.75/87%, FEV1 2.29/93%, FEV1/FVC 0.83, FEF 25-75 percent 2.83/126%. PFT 01/08/2018- insignificant response to dilator, mild restriction, diffusion mildly reduced.  FVC 2.52/78%, FEV1 2.21/90%, ratio 1.88, FEF 25-75% 3.30/259%, TLC 75%, DLCO 63% ------------------------------------------------------------------------------   07/02/21- 71 year old female never smoker followed for Bronchitis, Rhinitis, complicated by GERD, history breast cancer/XRT, history DVT/PE/Xarelto, Covid infection November 2020, CAD,  -Ventolin hfa, Neb Duoneb,  Covid vax-none Flu vax- ED 04/04/21- Acute bronchitis, cervical adenopathy> Urgent Care 06/15/21 for viral acute bronchospasm>Zpak, pred, neb albuterol,  Disliked Breztri- didn't seem effective Still has mild cough but nonproductive and no fever.  Waxing and waning left frontal headache over the past week.  No nasal discharge.  Ears feel a bit full but on exam only a little nonobstructive cerumen. Cervical adenopathy noted this winter during acute viral illness but now no longer palpable, consistent with reactive adenopathy. CT neck 05/29/21- IMPRESSION: No neck mass. Several small and nonenlarged right posterior triangle lymph nodes underlie the skin marker. These are nonspecific and suggest clinical follow-up since palpable.  01/27/22- 71 year old female never smoker followed for Bronchitis, Rhinitis, complicated by GERD, history breast cancer/XRT, history DVT/PE/Xarelto, Covid infection November 2020, CAD,  -Ventolin hfa, Neb Duoneb,  Covid vax-none Flu vax-had ED in Oct for back pain after fall CPET 10/14/21- Conclusion: Exercise testing with gas exchange demonstrates normal functional capacity when compared to matched sedentary norms.  There is no indication for cardiopulmonary abnormality. Patient appears primarily ventilatory limited due to body habitus.  ECHO 11/19/21- WNL -----Pt states she is doing fine  She says Diane Barker was "too strong for me".  She has occasional dry cough which is stable, and little wheeze.  She is comfortable using her albuterol rescue inhaler a few times a week.  Still has her nebulizer machine but has not needed it in a long time.  Does not seem to need a maintenance inhaler.  Review of Systems-See HPI   + = positive Constitutional:   No-   weight loss, night sweats, fevers, chills, fatigue, lassitude. HEENT:   +  headaches, No-difficulty swallowing, tooth/dental problems, +sore throat,        sneezing, itching, ear ache, nasal congestion, +post nasal drip,  CV:  +atypical chest pain, orthopnea, PND, swelling in lower extremities, anasarca,  dizziness, palpitations Resp: +shortness of breath with exertion or at rest.              + productive cough,  + non-productive cough,  No- coughing up of blood.           change in color of mucus.  No- wheezing.   Skin: No-   rash or lesions. GI:  +  heartburn, indigestion, abdominal pain, +nausea, vomiting,  GU: MS:  No-   joint pain or swelling, + R flank pain Neuro-     nothing unusual Psych:  No- change in mood or affect. No depression or anxiety.  No memory loss.  Objective:  OBJ- Physical Exam General- Alert, Oriented, Affect-appropriate/ calm, Distress- none acute, + obese Skin- rash-none, lesions- none, excoriation+  Lymphadenopathy- none Head- atraumatic            Eyes- Gross vision intact, PERRLA, conjunctivae and secretions clear            Ears- Hearing,  canals-normal            Nose- clear, no-Septal dev, mucus, polyps, erosion, perforation             Throat- Mallampati IV , mucosa clear , drainage- none, tonsils- atrophic,  + Upper plate and many missing teeth Neck- flexible , trachea midline, no stridor , thyroid nl, carotid no  bruit Chest - symmetrical excursion , unlabored           Heart/CV- RRR , no murmur , no gallop  , no rub, nl s1 s2                           - JVD- none , edema-none, stasis changes- none, varices- none           Lung- + few loose rhonchi right chest, unlabored, wheeze- none, cough+none, dullness-none, rub- none           Chest wall-  Abd-  Br/ Gen/ Rectal- Not done, not indicated Extrem- cyanosis- none, clubbing, none, atrophy- none, strength- nl, + excoriated stasis dermatitis Neuro- grossly intact to observation

## 2022-01-27 ENCOUNTER — Ambulatory Visit (INDEPENDENT_AMBULATORY_CARE_PROVIDER_SITE_OTHER): Payer: 59 | Admitting: Internal Medicine

## 2022-01-27 ENCOUNTER — Encounter: Payer: Self-pay | Admitting: Internal Medicine

## 2022-01-27 VITALS — BP 120/62 | HR 69 | Ht 65.5 in | Wt 240.2 lb

## 2022-01-27 DIAGNOSIS — I2609 Other pulmonary embolism with acute cor pulmonale: Secondary | ICD-10-CM | POA: Diagnosis not present

## 2022-01-27 DIAGNOSIS — I2782 Chronic pulmonary embolism: Secondary | ICD-10-CM | POA: Diagnosis not present

## 2022-01-27 DIAGNOSIS — J4531 Mild persistent asthma with (acute) exacerbation: Secondary | ICD-10-CM | POA: Diagnosis not present

## 2022-01-27 MED ORDER — PROAIR HFA 108 (90 BASE) MCG/ACT IN AERS: 2.0000 | INHALATION_SPRAY | RESPIRATORY_TRACT | 4 refills | Status: DC | PRN

## 2022-01-27 NOTE — Patient Instructions (Signed)
Glad you are doing well with your albuterol inhaler.  Please call if we can help

## 2022-01-28 NOTE — Assessment & Plan Note (Signed)
Satisfactory control Plan- continue occasional use of rescue inhaler. Nebulizer available if needed.

## 2022-01-28 NOTE — Assessment & Plan Note (Signed)
Has been continued on Xarelto No bleeding noted

## 2022-01-31 ENCOUNTER — Telehealth: Payer: Self-pay

## 2022-01-31 NOTE — Telephone Encounter (Signed)
LVMT requesting call back about next PREP class starting 02/11/22

## 2022-03-13 ENCOUNTER — Telehealth: Payer: Self-pay | Admitting: Internal Medicine

## 2022-03-13 NOTE — Telephone Encounter (Signed)
Spoke with pt she states that she has been having having a chest pain above her left breast that goes down her shoulder, arm, and neck. This is intermittent and not every day sometimes 1-2 time that day as well. She states that yesterday she just wanted to see if her nitro would help. She took the nitro and after a few seconds her pain resolved with nitro x1. She states that after where the pain was it was sore after. She denies any other symptoms.  I have scheduled an appointment for this pt 1-11 she will go to the ER when this happens again before this appointment.

## 2022-03-13 NOTE — Telephone Encounter (Signed)
Pt c/o of Chest Pain: STAT if CP now or developed within 24 hours  1. Are you having CP right now? sore  2. Are you experiencing any other symptoms (ex. SOB, nausea, vomiting, sweating)? no  3. How long have you been experiencing CP? At least a month  4. Is your CP continuous or coming and going? Comes and goes  5. Have you taken Nitroglycerin? Yes, it did help  Patient states she has been having a pain above her left breast that goes down her shoulder, arm, and neck. She says it is a dull pain and when she has it she can hardly pick up her arm. She says last night she took a nitroglycerin and the pain stopped so she is now concerned this is related to her heart. She says she know she does have a blockage. She states she feeling sore in her chest now.

## 2022-03-27 ENCOUNTER — Encounter: Payer: Self-pay | Admitting: Internal Medicine

## 2022-03-27 ENCOUNTER — Ambulatory Visit: Payer: 59 | Attending: Internal Medicine | Admitting: Internal Medicine

## 2022-03-27 VITALS — BP 120/70 | HR 62 | Ht 65.0 in | Wt 239.8 lb

## 2022-03-27 DIAGNOSIS — Z86718 Personal history of other venous thrombosis and embolism: Secondary | ICD-10-CM

## 2022-03-27 DIAGNOSIS — Z86711 Personal history of pulmonary embolism: Secondary | ICD-10-CM

## 2022-03-27 DIAGNOSIS — R0609 Other forms of dyspnea: Secondary | ICD-10-CM | POA: Diagnosis not present

## 2022-03-27 DIAGNOSIS — I10 Essential (primary) hypertension: Secondary | ICD-10-CM

## 2022-03-27 DIAGNOSIS — Z79899 Other long term (current) drug therapy: Secondary | ICD-10-CM

## 2022-03-27 DIAGNOSIS — R5383 Other fatigue: Secondary | ICD-10-CM

## 2022-03-27 DIAGNOSIS — I25112 Atherosclerotic heart disease of native coronary artery with refractory angina pectoris: Secondary | ICD-10-CM | POA: Diagnosis not present

## 2022-03-27 DIAGNOSIS — E785 Hyperlipidemia, unspecified: Secondary | ICD-10-CM

## 2022-03-27 DIAGNOSIS — Z6839 Body mass index (BMI) 39.0-39.9, adult: Secondary | ICD-10-CM | POA: Diagnosis not present

## 2022-03-27 DIAGNOSIS — R55 Syncope and collapse: Secondary | ICD-10-CM

## 2022-03-27 MED ORDER — NITROGLYCERIN 0.4 MG SL SUBL
0.4000 mg | SUBLINGUAL_TABLET | SUBLINGUAL | Status: DC | PRN
  Administered 2022-03-27 (×2): 0.4 mg via SUBLINGUAL

## 2022-03-27 MED ORDER — ISOSORBIDE MONONITRATE ER 60 MG PO TB24: 90.0000 mg | ORAL_TABLET | Freq: Every day | ORAL | 0 refills | Status: DC

## 2022-03-27 MED ORDER — NITROGLYCERIN 0.4 MG SL SUBL: 0.4000 mg | SUBLINGUAL_TABLET | SUBLINGUAL | 3 refills | Status: AC | PRN

## 2022-03-27 NOTE — Progress Notes (Signed)
Cardiology Office Note:    Date:  03/27/2022  ID:  JAIA ALONGE, DOB 01/30/1951, MRN [id number]  PCP:  Shon Baton, MD  Cardiologist:  Elouise Munroe, MD  Electrophysiologist:  None   Referring MD: Shon Baton, MD   Chief Complaint/Reason for Referral: CAD  History of Present Illness:    Diane Barker is a 72 y.o. female with a history of nonobstructive coronary artery disease, GERD, HLD, HTN, obesity, and DVT and bilateral PE (2015) on chronic xarelto coming in today for chest pain.  Current visit: On 03/13/2022 she called the office reporting intermittent chest pain which didn't occur every day, but could happen 1-2 times a day. Her chest pain resolved with one nitroglycerin, but afterwards developed soreness in the same area. She reports being under a lot of stress which has been known to exacerbate her chest pain in the past. Her chest pain is usually localized above her left breast. It then radiates upwards to her left shoulder and neck, then progresses with distal radiation to the left arm. She has described this angina for me before, prompting her last cath. 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 She will note chest pain afterward, however she can also get chest pain at rest, and has chest pain currently. Of note, she takes dexilant which is managing her acid reflux very well; her chest pain is distinct from her acid reflux. No known history of esophageal spasm.  When she walked into her appointment today she was not having chest pain. However, during her examination she did have onset of active chest pain at a 4/10 severity. We gave her a nitroglycerin. She also has pain radiating deeply to her back. A few minutes later however her chest pain did subside.   Additionally she complains of fatigue/malaise and daytime somnolence. She feels as though she wants to sleep in bed all day. She has a history of depression and I am concerned that this  is related. She is not currently on BB as this worsened depressive symptoms.  She denies any palpitations, worsening shortness of breath, or peripheral edema. No lightheadedness, syncope, orthopnea, or PND.  Prior visits: She was hospitalized in early January 2022 for chest pain with a known proximal LAD lesion.  She was felt to have stable moderate one-vessel coronary artery disease with 60 to 70% stenosis of the proximal RCA.  Felt to have slight progression of LAD disease but not felt to be enough to cause unstable angina.  Focus was on medical management of CAD.    She did not tolerate Repatha unfortunately, though may have had concomitant covid 19 infection with a positive test at that time. She does not feel that the infection caused her to feel unwell and feels it was related to repatha, she does not want to try [id number] again.  I have reviewed her prior PFTs which suggested exercise PFTS may be helpful and results of routine PFT showed: "Conclusions: The reduced lung volumes, increased FEV1/FVC ratio and diffusion defect suggest an interstitial process such as fibrosis or interstitial inflammation. Anemia cannot be excluded as a potential cause of the diffusion defect without correcting the observed diffusing capacity for hemoglobin. In view of the severity of the diffusion defect, studies with exercise would be helpful to evaluate the presence of hypoxemia."  DOE had previously been one of her anginal symptoms, in addition to chest pressure with left arm radiation. We discussed functional  testing given recent cath in 2022. Cardiopulmonary exercise test on 10/14/21, CPET was mostly suggestive of deconditioning, mild chronotropic incompetence, and obesity related restrictive lung physiology. Marland Kitchen Her Echo 11/19/21 showed LVEF 55-60% and indeterminate diastolic parameters. We also discussed inclisiran, which she was going to consider.  At her last appointment, she complained of her left knee "locking up  on her" while moving in and out of bed. Previously received therapeutic injections. After walking her dog she felt fatigued and short winded. Additionally she was concerned about constant fatigue and daytime somnolence. She had not been screened for OSA so she was referred for a sleep study, I do not see that this has been completed.   Past Medical History:  Diagnosis Date   Anxiety    Arthritis    "all over my body" (01/15/2018)   Breast cancer, right breast ([place]) 2000   Rt lumpectomy, Chemo/XRT/rt axillary node    Bronchial asthma    Chronic lower back pain    Depression    Diverticulosis    DVT (deep venous thrombosis) ([place]) 06/2013   RLE   GERD (gastroesophageal reflux disease)    High cholesterol    Hx of adenomatous colonic polyps    Hypertension    Obesity    Osteoarthritis    Personal history of chemotherapy    Personal history of radiation therapy    Pulmonary embolism ([place]) 06/2013   "both lungs"   Spondylolisthesis     Past Surgical History:  Procedure Laterality Date   BREAST BIOPSY Right 2000   BREAST LUMPECTOMY Right 2000   for CA txed w/ chemo and radiation, right   CARDIAC CATHETERIZATION  03/21/2020   HERNIA REPAIR     INTRAVASCULAR PRESSURE WIRE/FFR STUDY N/A 01/18/2018   Procedure: INTRAVASCULAR PRESSURE WIRE/FFR STUDY;  Surgeon: Burnell Blanks, MD;  Location: [facility];  Service: Cardiovascular;  Laterality: N/A;   LEFT HEART CATH AND CORONARY ANGIOGRAPHY N/A 01/18/2018   Procedure: LEFT HEART CATH AND CORONARY ANGIOGRAPHY;  Surgeon: Burnell Blanks, MD;  Location: [facility];  Service: Cardiovascular;  Laterality: N/A;   LEFT HEART CATH AND CORONARY ANGIOGRAPHY N/A 03/21/2020   Procedure: LEFT HEART CATH AND CORONARY ANGIOGRAPHY;  Surgeon: Wellington Hampshire, MD;  Location: [facility];  Service: Cardiovascular;  Laterality: N/A;   TUBAL LIGATION     UMBILICAL HERNIA REPAIR  1983    Current Medications: Outpatient  Medications Prior to Visit  Medication Sig Dispense Refill   acetaminophen (TYLENOL) 500 MG tablet Take 500 mg by mouth every 6 (six) hours as needed for mild pain.     atorvastatin (LIPITOR) 40 MG tablet TAKE 1 TABLET BY MOUTH EVERY DAY 90 tablet 3   Budeson-Glycopyrrol-Formoterol (BREZTRI AEROSPHERE) 160-9-4.8 MCG/ACT AERO Inhale 2 puffs into the lungs in the morning and at bedtime. 5.9 g 0   Cholecalciferol (VITAMIN D) 2000 UNITS CAPS Take 4,000 Units by mouth daily.      ciclopirox (PENLAC) 8 % solution APPLY TOPICALLY AT BEDTIME. APPLY OVER NAIL AND SURROUNDING SKIN. APPLY DAILY OVER PREVIOUS COAT. AFTER SEVEN (7) DAYS, MAY REMOVE WITH ALCOHOL AND CONTINUE CYCLE. 6.6 mL 0   clotrimazole-betamethasone (LOTRISONE) cream Apply 1 application topically 2 (two) times daily. 30 g 0   dexlansoprazole (DEXILANT) 60 MG capsule Take 60 mg by mouth daily.     escitalopram (LEXAPRO) 10 MG tablet Take 10 mg by mouth at bedtime.     isosorbide mononitrate (IMDUR) 60 MG 24  hr tablet Take 1 tablet (60 mg total) by mouth daily. 90 tablet 1   nitroGLYCERIN (NITROSTAT) 0.4 MG SL tablet Place 1 tablet (0.4 mg total) under the tongue every 5 (five) minutes as needed. 25 tablet 3   PROAIR HFA 108 (90 Base) MCG/ACT inhaler INHALE 2 PUFFS INTO THE LUNGS EVERY 4 (FOUR) HOURS AS NEEDED FOR WHEEZING OR SHORTNESS OF BREATH. 8.5 each 12   rivaroxaban (XARELTO) 20 MG TABS tablet Take 20 mg by mouth daily with supper.     Spacer/Aero-Holding Chambers (AEROCHAMBER MV) inhaler Use as instructed 1 each 0   vitamin B-12 (CYANOCOBALAMIN) 500 MCG tablet Take 500 mcg by mouth daily.     No facility-administered medications prior to visit.     Allergies:   Cefdinir, Erythromycin, Erythromycin, Gabapentin, Gabapentin, Lipitor [atorvastatin], Morphine, Morphine and related, Penicillins, Penicillins, and Relafen [nabumetone]   Social History   Tobacco Use   Smoking status: Never   Smokeless tobacco: Never  Vaping Use   Vaping  Use: Never used  Substance Use Topics   Alcohol use: Never    Alcohol/week: 0.0 standard drinks of alcohol   Drug use: Never     Family History: The patient's family history includes Asthma in her daughter and mother; Breast cancer in her maternal aunt; COPD in her mother; Colon cancer in her paternal aunt; Diabetes in her brother, daughter, and mother; Heart attack in her brother and mother; Hypertension in her brother; Kidney failure in her brother; Lung cancer in her father.  ROS:   Please see the history of present illness.    (+) Chest pain (+) Stress (+) Headache (+) Back pain (+) Fatigue/Malaise (+) Daytime somnolence All other systems reviewed and are negative.  EKGs/Labs/Other Studies Reviewed:    The following studies were reviewed today:  Echo 11/19/2021: Sonographer Comments: Technically difficult study due to poor echo windows and suboptimal apical window. Image acquisition challenging due to patient body habitus.   IMPRESSIONS   1. Left ventricular ejection fraction, by estimation, is 55 to 60%. The  left ventricle has normal function. The left ventricle has no regional  wall motion abnormalities. Left ventricular diastolic parameters are  indeterminate.   2. Right ventricular systolic function is normal. The right ventricular  size is normal. Tricuspid regurgitation signal is inadequate for assessing  PA pressure.   3. Left atrial size was moderately dilated.   4. The mitral valve is degenerative. Mild mitral valve regurgitation. No  evidence of mitral stenosis.   5. The aortic valve is tricuspid. Aortic valve regurgitation is trivial.  No aortic stenosis is present.   Comparison(s): No significant change from prior study. 01/16/18 EF 55-60%.   Cardiopulmonary Exercise Test 10/14/2021: Conclusion: Exercise testing with gas exchange demonstrates normal functional capacity when compared to matched sedentary norms. There is no indication for cardiopulmonary  abnormality. Patient appears primarily ventilatory limited due to body habitus.   Bilateral LE Venous Doppler 01/28/2021: Summary:  BILATERAL:  - No evidence of deep vein thrombosis seen in the lower extremities,  bilaterally.  - No evidence of superficial venous thrombosis in the lower extremities,  bilaterally.  -No evidence of popliteal cyst, bilaterally.  RIGHT:  - Findings consistent with chronic superficial vein thrombosis involving  the right small saphenous vein.  CTA 03/21/20 Ost 1st Diag lesion is 30% stenosed. Ost LAD to Prox LAD lesion is 10% stenosed. Ost 2nd Diag lesion is 50% stenosed. Mid LAD lesion is 40% stenosed. Prox LAD lesion is 65% stenosed. Prox  Cx to Mid Cx lesion is 30% stenosed. Ost RCA to Prox RCA lesion is 20% stenosed. Ost RPDA to RPDA lesion is 20% stenosed. Mid RCA lesion is 20% stenosed. The left ventricular systolic function is normal. LV end diastolic pressure is normal. The left ventricular ejection fraction is 55-65% by visual estimate. 1.  Stable moderate one-vessel coronary artery disease with 60 to 70% stenosis in the proximal right coronary artery which is eccentric and calcified at the origin of the first diagonal.  The mid lesion seems to be better than before at 40%.  No evidence of obstructive disease. 2.  Normal LV systolic function normal left ventricular end-diastolic pressure.  Monitor 11/16/19 IMPRESSION: infrequent ectopy, brief episode of asymptomatic SVT. Symptoms associated with SR and sinus tachycardia.   Lower Venous DVT 06/20/19 RIGHT:  - No evidence of common femoral vein obstruction.  LEFT:  - There is no evidence of deep vein thrombosis in the lower extremity.  - A cystic structure is found in the popliteal fossa extending into the  proximal medial calf, suggestive of possible ruptured Baker's cyst.   CTA 01/18/18 Ost RCA to Prox RCA lesion is 20% stenosed. Ost RPDA to RPDA lesion is 20% stenosed. Mid RCA lesion is 20%  stenosed. Prox Cx to Mid Cx lesion is 30% stenosed. Prox LAD lesion is 60% stenosed. Ost LAD to Prox LAD lesion is 10% stenosed. Ost 1st Diag lesion is 30% stenosed. Mid LAD lesion is 50% stenosed. Ost 2nd Diag lesion is 50% stenosed. 1. Moderate, eccentric, calcified stenosis in the mid LAD at the takeoff of the first diagonal branch and moderate eccentric stenosis in the mid to distal LAD at the takeoff of the second diagonal branch. DFR and FFR analysis of the mid and distal LAD suggests the lesions are not flow limiting. In multiple angiographic views, the lesions appear to be moderate only.  2. Mild non-obstructive disease in the RCA and Circumflex  Echo 01/16/18 - Procedure narrative: Transthoracic echocardiography. Image    quality was poor. The study was technically difficult, as a    result of body habitus. Intravenous contrast (Definity) was    administered.  - Left ventricle: The cavity size was normal. Wall thickness was    increased in a pattern of moderate LVH. Systolic function was    normal. The estimated ejection fraction was in the range of 55%    to 60%. Wall motion was normal; there were no regional wall    motion abnormalities. Doppler parameters are consistent with    abnormal left ventricular relaxation (grade 1 diastolic    dysfunction). The E/e&' ratio is >15, suggesting elevated LV    filling pressure.  - Mitral valve: Calcified annulus. Mildly thickened leaflets .    There was trivial regurgitation.  - Left atrium: The atrium was normal in size.  - Inferior vena cava: The vessel was normal in size. The    respirophasic diameter changes were in the normal range (>= 50%),    consistent with normal central venous pressure.  Impressions:  - Technically difficult study. Definity contrast given LVEF 55-60%,    moderate LVH, no definite regional wall motion abnormalities,    grade 1 DD, elevated LV filling pressure, trivial MR, normal LA    size, normal IVC.    EKG: EKG is personally reviewed. 03/27/2022:  Sinus rhythm. LVH.  10/10/21: NSR irbbb, lvh 01/15/21: NSR, rate 65 bpm 07/09/2020: NSR, incomplete RBBB, non-specific t waves abnormality in the lateral leads, Rate:  72 bpm 12/28/2019: NSR, possible LVH  I have independently reviewed the images from cardiac catheterization performed 03/21/2020.  Recent Labs: No results found for requested labs within last 365 days.   Recent Lipid Panel    Component Value Date/Time   CHOL 130 03/22/2020 0042   TRIG 58 03/22/2020 0042   HDL 46 03/22/2020 0042   CHOLHDL 2.8 03/22/2020 0042   VLDL 12 03/22/2020 0042   LDLCALC 72 03/22/2020 0042    Physical Exam:    VS:  BP 120/70   Pulse 62   Ht '5\' 5"'$  (1.651 m)   Wt 239 lb 12.8 oz (108.8 kg)   SpO2 96%   BMI 39.90 kg/m     Wt Readings from Last 5 Encounters:  03/27/22 239 lb 12.8 oz (108.8 kg)  01/27/22 240 lb 3.2 oz (109 kg)  12/12/21 242 lb (109.8 kg)  10/10/21 244 lb 9.6 oz (110.9 kg)  07/02/21 242 lb 12.8 oz (110.1 kg)    Constitutional: No acute distress Eyes: sclera non-icteric, normal conjunctiva and lids ENMT: normal dentition, moist mucous membranes Cardiovascular: regular rhythm, normal rate, no murmurs. S1 and S2 normal.  No jugular venous distention.  Respiratory: clear to auscultation bilaterally GI : normal bowel sounds, soft and nontender. No distention.   MSK: extremities warm, well perfused. No edema. Discoloration/wounds of right shin with pruritus consistent with venous stasis changes. NEURO: grossly nonfocal exam, moves all extremities. PSYCH: alert and oriented x 3, normal mood and affect.   ASSESSMENT:    1. Coronary artery disease involving native coronary artery of native heart with refractory angina pectoris ([place])   2. Medication management   3. Dyspnea on exertion   4. BMI 39.0-39.9,adult   5. Hyperlipidemia, unspecified hyperlipidemia type   6. Essential hypertension   7. History of DVT (deep vein thrombosis)    8. Syncope and collapse   9. Fatigue, unspecified type   10. History of pulmonary embolus (PE)     PLAN:    Chest pain CAD Med management HLD HTN -She has required several doses of nitroglycerin for chest pain in the last 10 days, provoked by emotional stress.  She has lost several people close to her since Christmas and is having significant difficulties with her granddaughter's behavior.  This emotional stress is provoking angina which is nitro responsive.  She has known CAD and last cath was 2 years ago.  Notes in her last cardiac catheterization report suggest her access is complex, and she may have disease requiring atherectomy if intervention is pursued.  Given the fact that we have essentially maximized medical therapy yet she continues to have angina, I would like to consider cardiac catheterization.  However given that the last 2 cardiac catheterizations have revealed nonobstructive CAD with borderline severe lesions, I would like the films reviewed by interventional cardiology prior to pursuing a third cardiac catheterization.  I have requested consultation with my partner Dr. Peter Martinique, she is on the schedule for tomorrow. -It is possible that her symptoms also represent microvascular disease.  If cardiac catheterization either is not recommended or is performed but again demonstrates nonobstructive CAD, it may be worth pursuing a PET CT with myocardial blood flow quantitation to better understand the nature of her angina. -I would like to increase her Imdur today to 90 mg given that she required 2 doses of nitroglycerin sublingual while in the office for her appointment.  We have recommended that if she has any recurrence of chest pain after  the visit, she should promptly present to the emergency department.  She demonstrates understanding and will do so. -Does not tolerate beta-blocker due to worsening depression -Did not tolerate Repatha for unclear reasons, she may be amenable to  inclisiran.  DOE - CPET showed obesity related restriction of lung physiology, deconditioning and mild chronotropic incompetance - repeat echo unchanged.  - recommend sleep study for DOE and fatigue.  This has not yet been performed.  History of DVT and PE-continue Xarelto.  No bleeding events. Recommend compression socks.  Syncope-no interval syncop.  Obesity -complicating symptoms above. Recommend referral to PREP.   Total time of encounter: 40 minutes total time of encounter, including 30 minutes spent in face-to-face patient care on the date of this encounter. This time includes coordination of care and counseling regarding above mentioned problem list. Remainder of non-face-to-face time involved reviewing chart documents/testing relevant to the patient encounter and documentation in the medical record. I have independently reviewed documentation from referring provider.   Cherlynn Kaiser, MD, [facility]   Medication Adjustments/Labs and Tests Ordered: Current medicines are reviewed at length with the patient today.  Concerns regarding medicines are outlined above.   Orders Placed This Encounter  Procedures   EKG 12-Lead   Meds ordered this encounter  Medications   isosorbide mononitrate (IMDUR) 60 MG 24 hr tablet    Sig: Take 1.5 tablets (90 mg total) by mouth daily.    Dispense:  45 tablet    Refill:  0   nitroGLYCERIN (NITROSTAT) 0.4 MG SL tablet    Sig: Place 1 tablet (0.4 mg total) under the tongue every 5 (five) minutes as needed.    Dispense:  25 tablet    Refill:  3   nitroGLYCERIN (NITROSTAT) SL tablet 0.4 mg   Patient Instructions  Medication Instructions:  Imdur '90mg'$ - take 1 1/2 tablets daily *If you need a refill on your cardiac medications before your next appointment, please call your pharmacy*   Lab Work: None If you have labs (blood work) drawn today and your tests are completely normal, you will receive your results only  by: [place] (if you have MyChart) OR A paper copy in the mail If you have any lab test that is abnormal or we need to change your treatment, we will call you to review the results.   Testing/Procedures: None   Follow-Up: At [facility], you and your health needs are our priority.  As part of our continuing mission to provide you with exceptional heart care, we have created designated Provider Care Teams.  These Care Teams include your primary Cardiologist (physician) and Advanced Practice Providers (APPs -  Physician Assistants and Nurse Practitioners) who all work together to provide you with the care you need, when you need it.  We recommend signing up for the patient portal called "MyChart".  Sign up information is provided on this After Visit Summary.  MyChart is used to connect with patients for Virtual Visits (Telemedicine).  Patients are able to view lab/test results, encounter notes, upcoming appointments, etc.  Non-urgent messages can be sent to your provider as well.   To learn more about what you can do with MyChart, go to [email or web address].    Your next appointment:   3 month(s)  Provider:   Elouise Munroe, MD     Other Instructions Consult with Dr Martinique 03/28/22 at 29    I,Mathew Stumpf,acting as a scribe for Elouise Munroe, MD.,have documented all  relevant documentation on the behalf of Elouise Munroe, MD,as directed by  Elouise Munroe, MD while in the presence of Elouise Munroe, MD.   I, Elouise Munroe, MD, have reviewed all documentation for the visit on 03/27/2022. The documentation on today's date of service for the exam, diagnosis, procedures, and orders are all accurate and complete.

## 2022-03-27 NOTE — Patient Instructions (Addendum)
Medication Instructions:  Imdur '90mg'$ - take 1 1/2 tablets daily *If you need a refill on your cardiac medications before your next appointment, please call your pharmacy*   Lab Work: None If you have labs (blood work) drawn today and your tests are completely normal, you will receive your results only by: [place] (if you have MyChart) OR A paper copy in the mail If you have any lab test that is abnormal or we need to change your treatment, we will call you to review the results.   Testing/Procedures: None   Follow-Up: At [facility], you and your health needs are our priority.  As part of our continuing mission to provide you with exceptional heart care, we have created designated Provider Care Teams.  These Care Teams include your primary Cardiologist (physician) and Advanced Practice Providers (APPs -  Physician Assistants and Nurse Practitioners) who all work together to provide you with the care you need, when you need it.  We recommend signing up for the patient portal called "MyChart".  Sign up information is provided on this After Visit Summary.  MyChart is used to connect with patients for Virtual Visits (Telemedicine).  Patients are able to view lab/test results, encounter notes, upcoming appointments, etc.  Non-urgent messages can be sent to your provider as well.   To learn more about what you can do with MyChart, go to [email or web address].    Your next appointment:   3 month(s)  Provider:   Elouise Munroe, MD     Other Instructions Consult with Dr Martinique 03/28/22 at 1030

## 2022-03-28 ENCOUNTER — Ambulatory Visit: Payer: 59 | Attending: Cardiology | Admitting: Cardiology

## 2022-03-28 ENCOUNTER — Other Ambulatory Visit: Payer: Self-pay | Admitting: Cardiology

## 2022-03-28 ENCOUNTER — Encounter: Payer: Self-pay | Admitting: Cardiology

## 2022-03-28 VITALS — BP 116/64 | HR 81 | Ht 65.0 in | Wt 241.0 lb

## 2022-03-28 DIAGNOSIS — Z6839 Body mass index (BMI) 39.0-39.9, adult: Secondary | ICD-10-CM

## 2022-03-28 DIAGNOSIS — Z86711 Personal history of pulmonary embolism: Secondary | ICD-10-CM

## 2022-03-28 DIAGNOSIS — I209 Angina pectoris, unspecified: Secondary | ICD-10-CM

## 2022-03-28 DIAGNOSIS — I25112 Atherosclerotic heart disease of native coronary artery with refractory angina pectoris: Secondary | ICD-10-CM | POA: Diagnosis not present

## 2022-03-28 DIAGNOSIS — Z86718 Personal history of other venous thrombosis and embolism: Secondary | ICD-10-CM

## 2022-03-28 DIAGNOSIS — I10 Essential (primary) hypertension: Secondary | ICD-10-CM

## 2022-03-28 DIAGNOSIS — E785 Hyperlipidemia, unspecified: Secondary | ICD-10-CM

## 2022-03-28 LAB — CBC WITH DIFFERENTIAL/PLATELET

## 2022-03-28 MED ORDER — SODIUM CHLORIDE 0.9% FLUSH: 3.0000 mL | Freq: Two times a day (BID) | INTRAVENOUS | Status: DC

## 2022-03-28 NOTE — H&P (View-Only) (Signed)
Cardiology Office Note:  Interventional Cardiology    Date:  03/28/2022   ID:  Diane Barker, DOB 1950-11-26, MRN [id number]  PCP:  Shon Baton, MD   [facility] Providers Cardiologist:  Elouise Munroe, MD     Referring MD: Shon Baton, MD   Chief Complaint  Patient presents with   Coronary Artery Disease   Chest Pain    History of Present Illness:    Diane Barker is a 71 y.o. female is seen at the request of Dr Margaretann Loveless for evaluation of angina. She has a history of breast CA s/p chemo and radiation therapy. History of asthma and HLD. Remote history of DVT and PE in 2015. She was initially evaluated for chest pain in 2019. Coronary CTA at that time suggested significant obstructive disease in the mid LAD with abnormal FFR. Subsequent cardiac cath showed 60% calcified stenosis in the LAD with normal DFR and FFR at that time. Medical therapy recommended. 2 years ago she had repeat cardiac cath showing no change. Flow analysis not done at the time. She had a normal CPX and Echo this past year.   The patient reports since October she has increased chest pain. Pain is located in left upper anterior chest radiating into left neck posteriorly and into the upper left arm. It is triggered by stress. Not exertional. May occur when playing games on her phone or sitting in recliner. She does have a lot of family stressors. It is relieved with sl Ntg and sometimes Tylenol. Denies symptoms at night. She is fatigued a lot. Apparently did not tolerate beta blocker in the past.   Past Medical History:  Diagnosis Date   Anxiety    Arthritis    "all over my body" (01/15/2018)   Breast cancer, right breast ([place]) 2000   Rt lumpectomy, Chemo/XRT/rt axillary node    Bronchial asthma    Chronic lower back pain    Depression    Diverticulosis    DVT (deep venous thrombosis) ([place]) 06/2013   RLE   GERD (gastroesophageal reflux disease)    High cholesterol    Hx of adenomatous colonic polyps     Hypertension    Obesity    Osteoarthritis    Personal history of chemotherapy    Personal history of radiation therapy    Pulmonary embolism ([place]) 06/2013   "both lungs"   Spondylolisthesis     Past Surgical History:  Procedure Laterality Date   BREAST BIOPSY Right 2000   BREAST LUMPECTOMY Right 2000   for CA txed w/ chemo and radiation, right   CARDIAC CATHETERIZATION  03/21/2020   HERNIA REPAIR     INTRAVASCULAR PRESSURE WIRE/FFR STUDY N/A 01/18/2018   Procedure: INTRAVASCULAR PRESSURE WIRE/FFR STUDY;  Surgeon: Burnell Blanks, MD;  Location: [facility];  Service: Cardiovascular;  Laterality: N/A;   LEFT HEART CATH AND CORONARY ANGIOGRAPHY N/A 01/18/2018   Procedure: LEFT HEART CATH AND CORONARY ANGIOGRAPHY;  Surgeon: Burnell Blanks, MD;  Location: [facility];  Service: Cardiovascular;  Laterality: N/A;   LEFT HEART CATH AND CORONARY ANGIOGRAPHY N/A 03/21/2020   Procedure: LEFT HEART CATH AND CORONARY ANGIOGRAPHY;  Surgeon: Wellington Hampshire, MD;  Location: [facility];  Service: Cardiovascular;  Laterality: N/A;   TUBAL LIGATION     UMBILICAL HERNIA REPAIR  1983    Current Medications: Current Meds  Medication Sig   acetaminophen (TYLENOL) 500 MG tablet Take 500 mg by mouth every  6 (six) hours as needed for mild pain.   atorvastatin (LIPITOR) 40 MG tablet TAKE 1 TABLET BY MOUTH EVERY DAY   clotrimazole-betamethasone (LOTRISONE) cream Apply 1 application topically 2 (two) times daily.   dexlansoprazole (DEXILANT) 60 MG capsule Take 60 mg by mouth daily.   escitalopram (LEXAPRO) 10 MG tablet Take 10 mg by mouth at bedtime.   isosorbide mononitrate (IMDUR) 60 MG 24 hr tablet Take 1.5 tablets (90 mg total) by mouth daily.   nitroGLYCERIN (NITROSTAT) 0.4 MG SL tablet Place 1 tablet (0.4 mg total) under the tongue every 5 (five) minutes as needed.   PROAIR HFA 108 (90 Base) MCG/ACT inhaler Inhale 2 puffs into the lungs every 4 (four) hours as  needed for wheezing or shortness of breath.   rivaroxaban (XARELTO) 20 MG TABS tablet Take 20 mg by mouth daily with supper.   Spacer/Aero-Holding Chambers (AEROCHAMBER MV) inhaler Use as instructed   Current Facility-Administered Medications for the 03/28/22 encounter (Office Visit) with [place], Totiana Everson M, MD  Medication   nitroGLYCERIN (NITROSTAT) SL tablet 0.4 mg     Allergies:   Cefdinir, Erythromycin, Erythromycin, Gabapentin, Gabapentin, Lipitor [atorvastatin], Morphine, Morphine and related, Penicillins, Penicillins, and Relafen [nabumetone]   Social History   Socioeconomic History   Marital status: Single    Spouse name: Not on file   Number of children: 2   Years of education: 10   Highest education level: Not on file  Occupational History   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: [employer]  Tobacco Use   Smoking status: Never   Smokeless tobacco: Never  Vaping Use   Vaping Use: Never used  Substance and Sexual Activity   Alcohol use: Never    Alcohol/week: 0.0 standard drinks of alcohol   Drug use: Never   Sexual activity: Not Currently  Other Topics Concern   Not on file  Social History Narrative   ** Merged History Encounter **       Lives at home alone Caffeine use- soda 16-24 oz weekly   Social Determinants of Health   Financial Resource Strain: Not on file  Food Insecurity: Not on file  Transportation Needs: Not on file  Physical Activity: Not on file  Stress: Not on file  Social Connections: Not on file     Family History: The patient's family history includes Asthma in her daughter and mother; Breast cancer in her maternal aunt; COPD in her mother; Colon cancer in her paternal aunt; Diabetes in her brother, daughter, and mother; Heart attack in her brother and mother; Hypertension in her brother; Kidney failure in her brother; Lung cancer in her father.  ROS:   Please see the history of present illness.     All other  systems reviewed and are negative.  EKGs/Labs/Other Studies Reviewed:    The following studies were reviewed today:  Coronary CTA: 01/16/18: Cardiac/Coronary  CT   TECHNIQUE: The patient was scanned on a [employer].   PROTOCOL: A 120 kV prospective scan was triggered in the descending thoracic aorta at 111 HU's. Axial non-contrast 3 mm slices were carried out through the heart. The data set was analyzed on a dedicated work station and scored using the [place]. Gantry rotation speed was 250 msecs and collimation was .6 mm. No beta blockade and 0.8 mg of sl NTG was given. The 3D data set was reconstructed in 5% intervals of the 67-82 % of the R-R cycle. Diastolic  phases were analyzed on a dedicated work station using MPR, MIP and VRT modes. The patient received 80 cc of contrast.   FINDINGS: Coronary calcium score: The patient's coronary artery calcium score is 455, which places the patient in the 93 percentile. This is higher than expected for age and gender matched peers.   Coronary arteries: Normal coronary origins.  Right dominance.   Right Coronary Artery: Minimal mixed atherosclerotic plaque in the proximal, mid and distal RCA (<25% stenosis). Patent medium caliber PDA and patent small caliber PL branch.   Left Main Coronary Artery: Minimal irregular plaque (<25% stenosis)   Left Anterior Descending Coronary Artery: There is a moderate heavily calcified plaque from proximal to mid LAD with 50-69% stenosis. There is a severe mixed atherosclerotic plaque in the mid LAD, 70-99% stenosis. There is a moderate calcified lesion immediately distal, and the distal LAD diffusely disease with mild atherosclerotic plaque (25-49% stenosis). Patent small caliber diagonal branches.   Left Circumflex Artery: Minimal scattered plaque (<25% stenosis) in the circumflex artery. There is a mild mixed atherosclerotic plaque at the ostium of OM1 (25-49% stenosis). Patent  distal vessel.   Aorta: Normal size. No calcifications. No dissection. Likely mildly tortuous aorta, arch not imaged for sidedness.   Aortic Valve: No calcifications.   Other findings:   Normal pulmonary vein drainage into the left atrium.   Normal let atrial appendage without a thrombus.   Normal size of the pulmonary artery.   Mild mitral annular calcification.   IMPRESSION: 1. The patient's coronary artery calcium score is 455, which places the patient in the 93 percentile. This is higher than expected for age and gender matched peers.   2. Normal coronary origin with right dominance.   3.  Severe CAD of the mid LAD, CADRADS = 4.     Electronically Signed   By: Cherlynn Kaiser   On: 01/16/2018 13:44  CT FFR ANALYSIS   CLINICAL DATA:  Unstable angina   FINDINGS: FFRct analysis was performed on the original cardiac CT angiogram dataset. Diagrammatic representation of the FFRct analysis is provided in a separate PDF document in PACS. This dictation was created using the PDF document and an interactive 3D model of the results. 3D model is not available in the EMR/PACS. Normal FFR range is >0.80.   1. Left Main:  No significant stenosis. FFR = 0.98   2. LAD: No significant stenosis. Proximal FFR = 0.96, Mid FFR = 0.77, Distal FFR = 0.68 3. LCX: No significant stenosis. Proximal FFR = 0.95, Distal FFR = 0.84, OM1 FFR = 0.84 4. RCA: No significant stenosis. Proximal FFR = 0.97, Mid FFR = 0.94, Distal FFR = 0.90   IMPRESSION: 1. CT FFR analysis showed flow limiting stenosis in the mid and distal LAD, FFR = 0.77 after mid LAD lesion.     Electronically Signed   By: Cherlynn Kaiser   On: 01/17/2018 13:08       Event monitor 11/16/19: Indication: syncope   Minimum HR (bpm): 49 Maximum HR (bpm): 136   Supraventricular Ectopy: <1%, rare SVT: 12 seconds of SVT on 11/10/19 at 3:40 pm, no diary events   Ventricular Ectopy:  <1% NSVT: none Ventricular  Tachycardia: none   Pauses: none AV block: none   Atrial fibrillation: none   Diary events: Symptoms of tired/fatigued, SOB, chest pain/pressure associated with sinus rhythm and sinus tachycardia with maximum rate 113 bpm. No definite ST deviation with chest pain or pressure symptom.  IMPRESSION: infrequent ectopy, brief episode of asymptomatic SVT. Symptoms associated with SR and sinus tachycardia.     Cardiac cath 03/21/20:  LEFT HEART CATH AND CORONARY ANGIOGRAPHY   Conclusion    Ost 1st Diag lesion is 30% stenosed. Ost LAD to Prox LAD lesion is 10% stenosed. Ost 2nd Diag lesion is 50% stenosed. Mid LAD lesion is 40% stenosed. Prox LAD lesion is 65% stenosed. Prox Cx to Mid Cx lesion is 30% stenosed. Ost RCA to Prox RCA lesion is 20% stenosed. Ost RPDA to RPDA lesion is 20% stenosed. Mid RCA lesion is 20% stenosed. The left ventricular systolic function is normal. LV end diastolic pressure is normal. The left ventricular ejection fraction is 55-65% by visual estimate.   1.  Stable moderate one-vessel coronary artery disease with 60 to 70% stenosis in the proximal right coronary artery which is eccentric and calcified at the origin of the first diagonal.  The mid lesion seems to be better than before at 40%.  No evidence of obstructive disease. 2.  Normal LV systolic function normal left ventricular end-diastolic pressure.   Recommendations: There might be slight progression in the proximal LAD disease but still not enough to cause unstable angina and symptoms at rest.  Recommend continuing medical therapy.  If the patient starts having anginal symptoms on a regular basis, PCI of the LAD can be considered but will likely require atherectomy or intravascular lithotripsy and protection of the first diagonal. Repeat Xarelto tomorrow if no bleeding issues.   Avoid catheterization via the left radial artery in the future given inability to get the wire into the ascending aorta.  In  the past, catheterization via the right radial artery was possible but also was difficult due to tortuosity of the innominate artery.    CPX 10/14/21: Procedure: This patient underwent staged symptom-limited exercise treadmill testing using a modified Naughton protocol with expired gas analysis metabolic evaluation during exercise.   Demographics   Age: 49 Ht. (in.) 65.5 Wt. (lb) 246.2 BMI: 40.3      Predicted Peak VO2: 14.1   Gender: Female Ht (cm) 166.4 Wt. (kg) 111.7    Results   Pre-Exercise PFTs   FVC 2.36 (80%)       FEV1 1.99 (88%)         FEV1/FVC 85  (108%)         MVV 73 (82%)     Exercise Time:    8:45   Speed (mph): 2.0       Grade (%): 10.5   RPE: 17   Reason stopped: dizziness (8/10)   Additional symptoms: dyspnea (5/10)   Resting HR: 63 Standing HR: 65 Peak HR: 115   (78% age predicted max HR)   BP rest: 138/76 Standing BP: 132/76 BP peak: 166/68   Peak VO2: 13.8 (95% predicted peak VO2)   VE/VCO2 slope:  33   OUES: 1.46   Peak RER: 1.22   Ventilatory Threshold: 10.1 (72% predicted or measured peak VO2)   Peak RR 51   Peak Ventilation:  61.0   VE/MVV:  91%   PETCO2 at peak:  33   O2pulse:  13   (111% predicted O2pulse)    Interpretation   Notes: Patient gave a very good effort. Pulse-oximetry remained 95-99% for the duration exercise.   ECG:  Resting ECG in normal sinus rhythm. HR response mostly appropriate. There were no sustained arrhythmias or ST-T changes. BP response appropriate.   PFT:  Pre-exercise spirometry was within normal  limits. The MVV was normal.   CPX:  Exercise testing with gas exchange demonstrates a normal peak VO2 of 13.8 ml/kg/min (95% of the age/gender/weight matched sedentary norms). The RER of 1.22 indicates a maxima; effort. When adjusted to the patient's ideal body weight of 144.0 lb (65.3 kg) the peak VO2 is 22.9 ml/kg (ibw)/min (118% of the ibw-adjusted predicted). The VE/VCO2 slope is mildly elevated and  indicates increased dead space ventilation. The oxygen uptake efficiency slope (OUES) is normal. The VO2 at the ventilatory threshold was normal at 72% of the predicted peak VO2. At peak exercise, the ventilation reached 91% of the measured MVV indicating ventilatory reserve was nearly depleted. The O2pulse (a surrogate for stroke volume) increased with incremental exercise, reaching peak at 13 ml/beat (111% predicted).    Conclusion: Exercise testing with gas exchange demonstrates normal functional capacity when compared to matched sedentary norms. There is no indication for cardiopulmonary abnormality. Patient appears primarily ventilatory limited due to body habitus.    Test, report and preliminary impression by:  Kathy Breach, MS, ACSM-RCEP  10/14/2021 4:20 PM   Agree with above. No obvious cardiopulmonary abnormality except for mild chronotropic incompetence. Patient is limited by her obesity and related restrictive lung physiology.   Glori Bickers, MD  9:51 PM    Echo 11/19/21: IMPRESSIONS     1. Left ventricular ejection fraction, by estimation, is 55 to 60%. The  left ventricle has normal function. The left ventricle has no regional  wall motion abnormalities. Left ventricular diastolic parameters are  indeterminate.   2. Right ventricular systolic function is normal. The right ventricular  size is normal. Tricuspid regurgitation signal is inadequate for assessing  PA pressure.   3. Left atrial size was moderately dilated.   4. The mitral valve is degenerative. Mild mitral valve regurgitation. No  evidence of mitral stenosis.   5. The aortic valve is tricuspid. Aortic valve regurgitation is trivial.  No aortic stenosis is present.   Comparison(s): No significant change from prior study. 01/16/18 EF 55-60%.   EKG:  EKG is not  ordered today.  The ekg ordered 10/10/21 demonstrates NSR with borderline voltage for LVH. I have personally reviewed and interpreted this  study.   Recent Labs: No results found for requested labs within last 365 days.  Recent Lipid Panel    Component Value Date/Time   CHOL 130 03/22/2020 0042   TRIG 58 03/22/2020 0042   HDL 46 03/22/2020 0042   CHOLHDL 2.8 03/22/2020 0042   VLDL 12 03/22/2020 0042   LDLCALC 72 03/22/2020 0042   Lab dated 01/13/22: cholesterol 135, triglycerides 65, HDL 46, LDL 76. TSH normal A1c 5.3%.   Risk Assessment/Calculations:                Physical Exam:    VS:  BP 116/64 (BP Location: Left Arm, Patient Position: Sitting, Cuff Size: Large)   Pulse 81   Ht '5\' 5"'$  (1.651 m)   Wt 241 lb (109.3 kg)   SpO2 96%   BMI 40.10 kg/m     Wt Readings from Last 3 Encounters:  03/28/22 241 lb (109.3 kg)  03/27/22 239 lb 12.8 oz (108.8 kg)  01/27/22 240 lb 3.2 oz (109 kg)     GEN:  Well nourished, obese in no acute distress HEENT: Normal NECK: No JVD; No carotid bruits LYMPHATICS: No lymphadenopathy CARDIAC: RRR, no murmurs, rubs, gallops RESPIRATORY:  Clear to auscultation without rales, wheezing or rhonchi  ABDOMEN: Soft, non-tender, non-distended MUSCULOSKELETAL:  No edema; No deformity  SKIN: Warm and dry NEUROLOGIC:  Alert and oriented x 3 PSYCHIATRIC:  becomes tearful when discussing her stress.   ASSESSMENT:    1. Coronary artery disease involving native coronary artery of native heart with refractory angina pectoris ([place])   2. BMI 39.0-39.9,adult   3. Essential hypertension   4. Hyperlipidemia, unspecified hyperlipidemia type   5. History of DVT (deep vein thrombosis)   6. History of pulmonary embolus (PE)    PLAN:    In order of problems listed above:  CAD - patient has known CAD. Mixed anginal symptoms occurring predominantly at rest in relation to stress. This could represent progression of her LAD disease, spasm on top of fixed stenosis, or microvascular disease. She did have cervical CT this past year without significant cervical disc disease. I think it is  reasonable to reassess her coronary situation. It has been 2 years since last evaluation. If angiogram is stable would recommend formal flow analysis looking at epicardial and microvascular flow. This will help determine treatment. If LAD stenosis is significant then she would be a candidate for stenting likely with atherectomy. Cath procedure will need to be with femoral access given prior difficulty from radial approaches. The procedure and risks were reviewed including but not limited to death, myocardial infarction, stroke, arrythmias, bleeding, transfusion, emergency surgery, dye allergy, or renal dysfunction. The patient voices understanding and is agreeable to proceed. History of DVT/PE remote. On chronic Xarelto.  HTN HLD.        Shared Decision Making/Informed Consent The risks [stroke (1 in 1000), death (1 in 1000), kidney failure [usually temporary] (1 in 500), bleeding (1 in 200), allergic reaction [possibly serious] (1 in 200)], benefits (diagnostic support and management of coronary artery disease) and alternatives of a cardiac catheterization were discussed in detail with Ms. Wenzler and she is willing to proceed.    Medication Adjustments/Labs and Tests Ordered: Current medicines are reviewed at length with the patient today.  Concerns regarding medicines are outlined above.  No orders of the defined types were placed in this encounter.  No orders of the defined types were placed in this encounter.   There are no Patient Instructions on file for this visit.   Signed, Bellarose Burtt [place], MD  03/28/2022 10:51 AM    [place]

## 2022-03-28 NOTE — Progress Notes (Signed)
Cardiology Office Note:  Interventional Cardiology    Date:  03/28/2022   ID:  Diane Barker, DOB 06/25/1950, MRN [id number]  PCP:  Shon Baton, MD   [facility] Providers Cardiologist:  Elouise Munroe, MD     Referring MD: Shon Baton, MD   Chief Complaint  Patient presents with   Coronary Artery Disease   Chest Pain    History of Present Illness:    Diane Barker is a 72 y.o. female is seen at the request of Dr Margaretann Loveless for evaluation of angina. She has a history of breast CA s/p chemo and radiation therapy. History of asthma and HLD. Remote history of DVT and PE in 2015. She was initially evaluated for chest pain in 2019. Coronary CTA at that time suggested significant obstructive disease in the mid LAD with abnormal FFR. Subsequent cardiac cath showed 60% calcified stenosis in the LAD with normal DFR and FFR at that time. Medical therapy recommended. 2 years ago she had repeat cardiac cath showing no change. Flow analysis not done at the time. She had a normal CPX and Echo this past year.   The patient reports since October she has increased chest pain. Pain is located in left upper anterior chest radiating into left neck posteriorly and into the upper left arm. It is triggered by stress. Not exertional. May occur when playing games on her phone or sitting in recliner. She does have a lot of family stressors. It is relieved with sl Ntg and sometimes Tylenol. Denies symptoms at night. She is fatigued a lot. Apparently did not tolerate beta blocker in the past.   Past Medical History:  Diagnosis Date   Anxiety    Arthritis    "all over my body" (01/15/2018)   Breast cancer, right breast ([place]) 2000   Rt lumpectomy, Chemo/XRT/rt axillary node    Bronchial asthma    Chronic lower back pain    Depression    Diverticulosis    DVT (deep venous thrombosis) ([place]) 06/2013   RLE   GERD (gastroesophageal reflux disease)    High cholesterol    Hx of adenomatous colonic polyps     Hypertension    Obesity    Osteoarthritis    Personal history of chemotherapy    Personal history of radiation therapy    Pulmonary embolism ([place]) 06/2013   "both lungs"   Spondylolisthesis     Past Surgical History:  Procedure Laterality Date   BREAST BIOPSY Right 2000   BREAST LUMPECTOMY Right 2000   for CA txed w/ chemo and radiation, right   CARDIAC CATHETERIZATION  03/21/2020   HERNIA REPAIR     INTRAVASCULAR PRESSURE WIRE/FFR STUDY N/A 01/18/2018   Procedure: INTRAVASCULAR PRESSURE WIRE/FFR STUDY;  Surgeon: Burnell Blanks, MD;  Location: [facility];  Service: Cardiovascular;  Laterality: N/A;   LEFT HEART CATH AND CORONARY ANGIOGRAPHY N/A 01/18/2018   Procedure: LEFT HEART CATH AND CORONARY ANGIOGRAPHY;  Surgeon: Burnell Blanks, MD;  Location: [facility];  Service: Cardiovascular;  Laterality: N/A;   LEFT HEART CATH AND CORONARY ANGIOGRAPHY N/A 03/21/2020   Procedure: LEFT HEART CATH AND CORONARY ANGIOGRAPHY;  Surgeon: Wellington Hampshire, MD;  Location: [facility];  Service: Cardiovascular;  Laterality: N/A;   TUBAL LIGATION     UMBILICAL HERNIA REPAIR  1983    Current Medications: Current Meds  Medication Sig   acetaminophen (TYLENOL) 500 MG tablet Take 500 mg by mouth every  6 (six) hours as needed for mild pain.   atorvastatin (LIPITOR) 40 MG tablet TAKE 1 TABLET BY MOUTH EVERY DAY   clotrimazole-betamethasone (LOTRISONE) cream Apply 1 application topically 2 (two) times daily.   dexlansoprazole (DEXILANT) 60 MG capsule Take 60 mg by mouth daily.   escitalopram (LEXAPRO) 10 MG tablet Take 10 mg by mouth at bedtime.   isosorbide mononitrate (IMDUR) 60 MG 24 hr tablet Take 1.5 tablets (90 mg total) by mouth daily.   nitroGLYCERIN (NITROSTAT) 0.4 MG SL tablet Place 1 tablet (0.4 mg total) under the tongue every 5 (five) minutes as needed.   PROAIR HFA 108 (90 Base) MCG/ACT inhaler Inhale 2 puffs into the lungs every 4 (four) hours as  needed for wheezing or shortness of breath.   rivaroxaban (XARELTO) 20 MG TABS tablet Take 20 mg by mouth daily with supper.   Spacer/Aero-Holding Chambers (AEROCHAMBER MV) inhaler Use as instructed   Current Facility-Administered Medications for the 03/28/22 encounter (Office Visit) with Martinique, Rhett Mutschler M, MD  Medication   nitroGLYCERIN (NITROSTAT) SL tablet 0.4 mg     Allergies:   Cefdinir, Erythromycin, Erythromycin, Gabapentin, Gabapentin, Lipitor [atorvastatin], Morphine, Morphine and related, Penicillins, Penicillins, and Relafen [nabumetone]   Social History   Socioeconomic History   Marital status: Single    Spouse name: Not on file   Number of children: 2   Years of education: 10   Highest education level: Not on file  Occupational History   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: [employer]  Tobacco Use   Smoking status: Never   Smokeless tobacco: Never  Vaping Use   Vaping Use: Never used  Substance and Sexual Activity   Alcohol use: Never    Alcohol/week: 0.0 standard drinks of alcohol   Drug use: Never   Sexual activity: Not Currently  Other Topics Concern   Not on file  Social History Narrative   ** Merged History Encounter **       Lives at home alone Caffeine use- soda 16-24 oz weekly   Social Determinants of Health   Financial Resource Strain: Not on file  Food Insecurity: Not on file  Transportation Needs: Not on file  Physical Activity: Not on file  Stress: Not on file  Social Connections: Not on file     Family History: The patient's family history includes Asthma in her daughter and mother; Breast cancer in her maternal aunt; COPD in her mother; Colon cancer in her paternal aunt; Diabetes in her brother, daughter, and mother; Heart attack in her brother and mother; Hypertension in her brother; Kidney failure in her brother; Lung cancer in her father.  ROS:   Please see the history of present illness.     All other  systems reviewed and are negative.  EKGs/Labs/Other Studies Reviewed:    The following studies were reviewed today:  Coronary CTA: 01/16/18: Cardiac/Coronary  CT   TECHNIQUE: The patient was scanned on a [employer].   PROTOCOL: A 120 kV prospective scan was triggered in the descending thoracic aorta at 111 HU's. Axial non-contrast 3 mm slices were carried out through the heart. The data set was analyzed on a dedicated work station and scored using the [place]. Gantry rotation speed was 250 msecs and collimation was .6 mm. No beta blockade and 0.8 mg of sl NTG was given. The 3D data set was reconstructed in 5% intervals of the 67-82 % of the R-R cycle. Diastolic  phases were analyzed on a dedicated work station using MPR, MIP and VRT modes. The patient received 80 cc of contrast.   FINDINGS: Coronary calcium score: The patient's coronary artery calcium score is 455, which places the patient in the 93 percentile. This is higher than expected for age and gender matched peers.   Coronary arteries: Normal coronary origins.  Right dominance.   Right Coronary Artery: Minimal mixed atherosclerotic plaque in the proximal, mid and distal RCA (<25% stenosis). Patent medium caliber PDA and patent small caliber PL branch.   Left Main Coronary Artery: Minimal irregular plaque (<25% stenosis)   Left Anterior Descending Coronary Artery: There is a moderate heavily calcified plaque from proximal to mid LAD with 50-69% stenosis. There is a severe mixed atherosclerotic plaque in the mid LAD, 70-99% stenosis. There is a moderate calcified lesion immediately distal, and the distal LAD diffusely disease with mild atherosclerotic plaque (25-49% stenosis). Patent small caliber diagonal branches.   Left Circumflex Artery: Minimal scattered plaque (<25% stenosis) in the circumflex artery. There is a mild mixed atherosclerotic plaque at the ostium of OM1 (25-49% stenosis). Patent  distal vessel.   Aorta: Normal size. No calcifications. No dissection. Likely mildly tortuous aorta, arch not imaged for sidedness.   Aortic Valve: No calcifications.   Other findings:   Normal pulmonary vein drainage into the left atrium.   Normal let atrial appendage without a thrombus.   Normal size of the pulmonary artery.   Mild mitral annular calcification.   IMPRESSION: 1. The patient's coronary artery calcium score is 455, which places the patient in the 93 percentile. This is higher than expected for age and gender matched peers.   2. Normal coronary origin with right dominance.   3.  Severe CAD of the mid LAD, CADRADS = 4.     Electronically Signed   By: Cherlynn Kaiser   On: 01/16/2018 13:44  CT FFR ANALYSIS   CLINICAL DATA:  Unstable angina   FINDINGS: FFRct analysis was performed on the original cardiac CT angiogram dataset. Diagrammatic representation of the FFRct analysis is provided in a separate PDF document in PACS. This dictation was created using the PDF document and an interactive 3D model of the results. 3D model is not available in the EMR/PACS. Normal FFR range is >0.80.   1. Left Main:  No significant stenosis. FFR = 0.98   2. LAD: No significant stenosis. Proximal FFR = 0.96, Mid FFR = 0.77, Distal FFR = 0.68 3. LCX: No significant stenosis. Proximal FFR = 0.95, Distal FFR = 0.84, OM1 FFR = 0.84 4. RCA: No significant stenosis. Proximal FFR = 0.97, Mid FFR = 0.94, Distal FFR = 0.90   IMPRESSION: 1. CT FFR analysis showed flow limiting stenosis in the mid and distal LAD, FFR = 0.77 after mid LAD lesion.     Electronically Signed   By: Cherlynn Kaiser   On: 01/17/2018 13:08       Event monitor 11/16/19: Indication: syncope   Minimum HR (bpm): 49 Maximum HR (bpm): 136   Supraventricular Ectopy: <1%, rare SVT: 12 seconds of SVT on 11/10/19 at 3:40 pm, no diary events   Ventricular Ectopy:  <1% NSVT: none Ventricular  Tachycardia: none   Pauses: none AV block: none   Atrial fibrillation: none   Diary events: Symptoms of tired/fatigued, SOB, chest pain/pressure associated with sinus rhythm and sinus tachycardia with maximum rate 113 bpm. No definite ST deviation with chest pain or pressure symptom.  IMPRESSION: infrequent ectopy, brief episode of asymptomatic SVT. Symptoms associated with SR and sinus tachycardia.     Cardiac cath 03/21/20:  LEFT HEART CATH AND CORONARY ANGIOGRAPHY   Conclusion    Ost 1st Diag lesion is 30% stenosed. Ost LAD to Prox LAD lesion is 10% stenosed. Ost 2nd Diag lesion is 50% stenosed. Mid LAD lesion is 40% stenosed. Prox LAD lesion is 65% stenosed. Prox Cx to Mid Cx lesion is 30% stenosed. Ost RCA to Prox RCA lesion is 20% stenosed. Ost RPDA to RPDA lesion is 20% stenosed. Mid RCA lesion is 20% stenosed. The left ventricular systolic function is normal. LV end diastolic pressure is normal. The left ventricular ejection fraction is 55-65% by visual estimate.   1.  Stable moderate one-vessel coronary artery disease with 60 to 70% stenosis in the proximal right coronary artery which is eccentric and calcified at the origin of the first diagonal.  The mid lesion seems to be better than before at 40%.  No evidence of obstructive disease. 2.  Normal LV systolic function normal left ventricular end-diastolic pressure.   Recommendations: There might be slight progression in the proximal LAD disease but still not enough to cause unstable angina and symptoms at rest.  Recommend continuing medical therapy.  If the patient starts having anginal symptoms on a regular basis, PCI of the LAD can be considered but will likely require atherectomy or intravascular lithotripsy and protection of the first diagonal. Repeat Xarelto tomorrow if no bleeding issues.   Avoid catheterization via the left radial artery in the future given inability to get the wire into the ascending aorta.  In  the past, catheterization via the right radial artery was possible but also was difficult due to tortuosity of the innominate artery.    CPX 10/14/21: Procedure: This patient underwent staged symptom-limited exercise treadmill testing using a modified Naughton protocol with expired gas analysis metabolic evaluation during exercise.   Demographics   Age: 67 Ht. (in.) 65.5 Wt. (lb) 246.2 BMI: 40.3      Predicted Peak VO2: 14.1   Gender: Female Ht (cm) 166.4 Wt. (kg) 111.7    Results   Pre-Exercise PFTs   FVC 2.36 (80%)       FEV1 1.99 (88%)         FEV1/FVC 85  (108%)         MVV 73 (82%)     Exercise Time:    8:45   Speed (mph): 2.0       Grade (%): 10.5   RPE: 17   Reason stopped: dizziness (8/10)   Additional symptoms: dyspnea (5/10)   Resting HR: 63 Standing HR: 65 Peak HR: 115   (78% age predicted max HR)   BP rest: 138/76 Standing BP: 132/76 BP peak: 166/68   Peak VO2: 13.8 (95% predicted peak VO2)   VE/VCO2 slope:  33   OUES: 1.46   Peak RER: 1.22   Ventilatory Threshold: 10.1 (72% predicted or measured peak VO2)   Peak RR 51   Peak Ventilation:  61.0   VE/MVV:  91%   PETCO2 at peak:  33   O2pulse:  13   (111% predicted O2pulse)    Interpretation   Notes: Patient gave a very good effort. Pulse-oximetry remained 95-99% for the duration exercise.   ECG:  Resting ECG in normal sinus rhythm. HR response mostly appropriate. There were no sustained arrhythmias or ST-T changes. BP response appropriate.   PFT:  Pre-exercise spirometry was within normal  limits. The MVV was normal.   CPX:  Exercise testing with gas exchange demonstrates a normal peak VO2 of 13.8 ml/kg/min (95% of the age/gender/weight matched sedentary norms). The RER of 1.22 indicates a maxima; effort. When adjusted to the patient's ideal body weight of 144.0 lb (65.3 kg) the peak VO2 is 22.9 ml/kg (ibw)/min (118% of the ibw-adjusted predicted). The VE/VCO2 slope is mildly elevated and  indicates increased dead space ventilation. The oxygen uptake efficiency slope (OUES) is normal. The VO2 at the ventilatory threshold was normal at 72% of the predicted peak VO2. At peak exercise, the ventilation reached 91% of the measured MVV indicating ventilatory reserve was nearly depleted. The O2pulse (a surrogate for stroke volume) increased with incremental exercise, reaching peak at 13 ml/beat (111% predicted).    Conclusion: Exercise testing with gas exchange demonstrates normal functional capacity when compared to matched sedentary norms. There is no indication for cardiopulmonary abnormality. Patient appears primarily ventilatory limited due to body habitus.    Test, report and preliminary impression by:  Kathy Breach, MS, ACSM-RCEP  10/14/2021 4:20 PM   Agree with above. No obvious cardiopulmonary abnormality except for mild chronotropic incompetence. Patient is limited by her obesity and related restrictive lung physiology.   Glori Bickers, MD  9:51 PM    Echo 11/19/21: IMPRESSIONS     1. Left ventricular ejection fraction, by estimation, is 55 to 60%. The  left ventricle has normal function. The left ventricle has no regional  wall motion abnormalities. Left ventricular diastolic parameters are  indeterminate.   2. Right ventricular systolic function is normal. The right ventricular  size is normal. Tricuspid regurgitation signal is inadequate for assessing  PA pressure.   3. Left atrial size was moderately dilated.   4. The mitral valve is degenerative. Mild mitral valve regurgitation. No  evidence of mitral stenosis.   5. The aortic valve is tricuspid. Aortic valve regurgitation is trivial.  No aortic stenosis is present.   Comparison(s): No significant change from prior study. 01/16/18 EF 55-60%.   EKG:  EKG is not  ordered today.  The ekg ordered 10/10/21 demonstrates NSR with borderline voltage for LVH. I have personally reviewed and interpreted this  study.   Recent Labs: No results found for requested labs within last 365 days.  Recent Lipid Panel    Component Value Date/Time   CHOL 130 03/22/2020 0042   TRIG 58 03/22/2020 0042   HDL 46 03/22/2020 0042   CHOLHDL 2.8 03/22/2020 0042   VLDL 12 03/22/2020 0042   LDLCALC 72 03/22/2020 0042   Lab dated 01/13/22: cholesterol 135, triglycerides 65, HDL 46, LDL 76. TSH normal A1c 5.3%.   Risk Assessment/Calculations:                Physical Exam:    VS:  BP 116/64 (BP Location: Left Arm, Patient Position: Sitting, Cuff Size: Large)   Pulse 81   Ht '5\' 5"'$  (1.651 m)   Wt 241 lb (109.3 kg)   SpO2 96%   BMI 40.10 kg/m     Wt Readings from Last 3 Encounters:  03/28/22 241 lb (109.3 kg)  03/27/22 239 lb 12.8 oz (108.8 kg)  01/27/22 240 lb 3.2 oz (109 kg)     GEN:  Well nourished, obese in no acute distress HEENT: Normal NECK: No JVD; No carotid bruits LYMPHATICS: No lymphadenopathy CARDIAC: RRR, no murmurs, rubs, gallops RESPIRATORY:  Clear to auscultation without rales, wheezing or rhonchi  ABDOMEN: Soft, non-tender, non-distended MUSCULOSKELETAL:  No edema; No deformity  SKIN: Warm and dry NEUROLOGIC:  Alert and oriented x 3 PSYCHIATRIC:  becomes tearful when discussing her stress.   ASSESSMENT:    1. Coronary artery disease involving native coronary artery of native heart with refractory angina pectoris ([place])   2. BMI 39.0-39.9,adult   3. Essential hypertension   4. Hyperlipidemia, unspecified hyperlipidemia type   5. History of DVT (deep vein thrombosis)   6. History of pulmonary embolus (PE)    PLAN:    In order of problems listed above:  CAD - patient has known CAD. Mixed anginal symptoms occurring predominantly at rest in relation to stress. This could represent progression of her LAD disease, spasm on top of fixed stenosis, or microvascular disease. She did have cervical CT this past year without significant cervical disc disease. I think it is  reasonable to reassess her coronary situation. It has been 2 years since last evaluation. If angiogram is stable would recommend formal flow analysis looking at epicardial and microvascular flow. This will help determine treatment. If LAD stenosis is significant then she would be a candidate for stenting likely with atherectomy. Cath procedure will need to be with femoral access given prior difficulty from radial approaches. The procedure and risks were reviewed including but not limited to death, myocardial infarction, stroke, arrythmias, bleeding, transfusion, emergency surgery, dye allergy, or renal dysfunction. The patient voices understanding and is agreeable to proceed. History of DVT/PE remote. On chronic Xarelto.  HTN HLD.        Shared Decision Making/Informed Consent The risks [stroke (1 in 1000), death (1 in 1000), kidney failure [usually temporary] (1 in 500), bleeding (1 in 200), allergic reaction [possibly serious] (1 in 200)], benefits (diagnostic support and management of coronary artery disease) and alternatives of a cardiac catheterization were discussed in detail with Ms. Eisner and she is willing to proceed.    Medication Adjustments/Labs and Tests Ordered: Current medicines are reviewed at length with the patient today.  Concerns regarding medicines are outlined above.  No orders of the defined types were placed in this encounter.  No orders of the defined types were placed in this encounter.   There are no Patient Instructions on file for this visit.   Signed, Treyten Monestime Martinique, MD  03/28/2022 10:51 AM    [place]

## 2022-03-28 NOTE — Patient Instructions (Signed)
Medication Instructions:  Continue same medications *If you need a refill on your cardiac medications before your next appointment, please call your pharmacy*   Lab Work: Bmet,cbc today   Testing/Procedures: Cardiac cath scheduled at [facility] 04/03/22 Arrive at 5:30 am  Follow instructions below   Follow-Up: At [facility], you and your health needs are our priority.  As part of our continuing mission to provide you with exceptional heart care, we have created designated Provider Care Teams.  These Care Teams include your primary Cardiologist (physician) and Advanced Practice Providers (APPs -  Physician Assistants and Nurse Practitioners) who all work together to provide you with the care you need, when you need it.  We recommend signing up for the patient portal called "MyChart".  Sign up information is provided on this After Visit Summary.  MyChart is used to connect with patients for Virtual Visits (Telemedicine).  Patients are able to view lab/test results, encounter notes, upcoming appointments, etc.  Non-urgent messages can be sent to your provider as well.   To learn more about what you can do with MyChart, go to [email or web address].    Your next appointment:  After Cath    Provider:  Dr.Jordan           Cardiac Catheterization   You are scheduled for a Cardiac Catheterization on Thursday, January 18 with Dr. Peter Martinique.  1. Please arrive at the Main Entrance A at [facility]: [place], [place] [address] on January 18 at 5:30 AM (This time is two hours before your procedure to ensure your preparation). Free valet parking service is available. You will check in at ADMITTING. The support person will be asked to wait in the waiting room.  It is OK to have someone drop you off and come back when you are ready to be discharged.        Special note: Every effort is made to have your procedure done on time. Please understand that  emergencies sometimes delay scheduled procedures.   . 2. Diet: Do not eat solid foods after midnight.  You may have clear liquids until 5 AM the day of the procedure.  3. Labs: You will need to have blood drawn on Friday 1/12 at [place] [facility] [place] do not need to be fasting.  4. Medication instructions in preparation for your procedure:   Hold Xarelto 2 days before cath.     On the morning of your procedure, take Aspirin 81 mg and any morning medicines NOT listed above.  You may use sips of water.  5. Plan to go home the same day, you will only stay overnight if medically necessary. 6. You MUST have a responsible adult to drive you home. 7. An adult MUST be with you the first 24 hours after you arrive home. 8. Bring a current list of your medications, and the last time and date medication taken. 9. Bring ID and current insurance cards. 10.Please wear clothes that are easy to get on and off and wear slip-on shoes.  Thank you for allowing [place] to care for you!   -- [facility]

## 2022-03-29 LAB — CBC WITH DIFFERENTIAL/PLATELET
Basophils Absolute: 0.1 10*3/uL (ref 0.0–0.2)
Basos: 1 %
EOS (ABSOLUTE): 0.2 10*3/uL (ref 0.0–0.4)
Eos: 3 %
Hematocrit: 38 % (ref 34.0–46.6)
Hemoglobin: 12.8 g/dL (ref 11.1–15.9)
Immature Grans (Abs): 0 10*3/uL (ref 0.0–0.1)
Immature Granulocytes: 0 %
Lymphocytes Absolute: 1.7 10*3/uL (ref 0.7–3.1)
Lymphs: 29 %
MCH: 32.6 pg (ref 26.6–33.0)
MCHC: 33.7 g/dL (ref 31.5–35.7)
MCV: 97 fL (ref 79–97)
Monocytes Absolute: 0.5 10*3/uL (ref 0.1–0.9)
Monocytes: 8 %
Neutrophils Absolute: 3.4 10*3/uL (ref 1.4–7.0)
Neutrophils: 59 %
Platelets: 223 10*3/uL (ref 150–450)
RBC: 3.93 x10E6/uL (ref 3.77–5.28)
RDW: 11.8 % (ref 11.7–15.4)
WBC: 5.8 10*3/uL (ref 3.4–10.8)

## 2022-03-29 LAB — BASIC METABOLIC PANEL
BUN/Creatinine Ratio: 17 (ref 12–28)
BUN: 16 mg/dL (ref 8–27)
CO2: 25 mmol/L (ref 20–29)
Calcium: 8.9 mg/dL (ref 8.7–10.3)
Chloride: 106 mmol/L (ref 96–106)
Creatinine, Ser: 0.94 mg/dL (ref 0.57–1.00)
Glucose: 86 mg/dL (ref 70–99)
Potassium: 4.1 mmol/L (ref 3.5–5.2)
Sodium: 142 mmol/L (ref 134–144)
eGFR: 64 mL/min/{1.73_m2} (ref >59)

## 2022-04-01 ENCOUNTER — Encounter (HOSPITAL_COMMUNITY): Payer: Self-pay

## 2022-04-02 ENCOUNTER — Telehealth: Payer: Self-pay | Admitting: *Deleted

## 2022-04-02 NOTE — Telephone Encounter (Signed)
Cardiac Catheterization scheduled at [facility] for: Thursday April 03, 2022 7:30 AM Arrival time and place: [place] Entrance A at: 5:30 AM  Nothing to eat after midnight prior to procedure, clear liquids until 5 AM day of procedure.  Medication instructions: -Hold:  Xarelto-none 03/31/22 until post procedure  -Except hold medication usual morning medications can be taken with sips of water including aspirin 81 mg.  Confirmed patient has responsible adult to drive home post procedure and be with patient first 24 hours after arriving home.  Patient reports no new symptoms concerning for COVID-19 in the past 10 days.  Reviewed procedure instructions with patient

## 2022-04-03 ENCOUNTER — Other Ambulatory Visit: Payer: Self-pay

## 2022-04-03 ENCOUNTER — Encounter (HOSPITAL_COMMUNITY)
Admission: RE | Disposition: A | Discharge status: Home / Self Care | Payer: Self-pay | Source: Home / Self Care | Attending: Cardiology

## 2022-04-03 ENCOUNTER — Ambulatory Visit (HOSPITAL_COMMUNITY)
Admission: RE | Admit: 2022-04-03 | Discharge: 2022-04-03 | Disposition: A | Discharge status: Home / Self Care | Payer: 59 | Attending: Cardiology | Admitting: Cardiology

## 2022-04-03 ENCOUNTER — Other Ambulatory Visit (HOSPITAL_COMMUNITY): Payer: Self-pay

## 2022-04-03 DIAGNOSIS — Z7901 Long term (current) use of anticoagulants: Secondary | ICD-10-CM | POA: Insufficient documentation

## 2022-04-03 DIAGNOSIS — Z853 Personal history of malignant neoplasm of breast: Secondary | ICD-10-CM | POA: Insufficient documentation

## 2022-04-03 DIAGNOSIS — I2584 Coronary atherosclerosis due to calcified coronary lesion: Secondary | ICD-10-CM | POA: Insufficient documentation

## 2022-04-03 DIAGNOSIS — K219 Gastro-esophageal reflux disease without esophagitis: Secondary | ICD-10-CM | POA: Insufficient documentation

## 2022-04-03 DIAGNOSIS — Z923 Personal history of irradiation: Secondary | ICD-10-CM | POA: Diagnosis not present

## 2022-04-03 DIAGNOSIS — I25119 Atherosclerotic heart disease of native coronary artery with unspecified angina pectoris: Secondary | ICD-10-CM | POA: Diagnosis not present

## 2022-04-03 DIAGNOSIS — Z9221 Personal history of antineoplastic chemotherapy: Secondary | ICD-10-CM | POA: Diagnosis not present

## 2022-04-03 DIAGNOSIS — J45909 Unspecified asthma, uncomplicated: Secondary | ICD-10-CM | POA: Diagnosis not present

## 2022-04-03 DIAGNOSIS — I209 Angina pectoris, unspecified: Secondary | ICD-10-CM

## 2022-04-03 DIAGNOSIS — I10 Essential (primary) hypertension: Secondary | ICD-10-CM | POA: Diagnosis not present

## 2022-04-03 DIAGNOSIS — I25110 Atherosclerotic heart disease of native coronary artery with unstable angina pectoris: Secondary | ICD-10-CM | POA: Diagnosis not present

## 2022-04-03 DIAGNOSIS — Z86711 Personal history of pulmonary embolism: Secondary | ICD-10-CM | POA: Diagnosis not present

## 2022-04-03 DIAGNOSIS — Z955 Presence of coronary angioplasty implant and graft: Secondary | ICD-10-CM

## 2022-04-03 DIAGNOSIS — Z8249 Family history of ischemic heart disease and other diseases of the circulatory system: Secondary | ICD-10-CM | POA: Insufficient documentation

## 2022-04-03 DIAGNOSIS — Z6841 Body Mass Index (BMI) 40.0 and over, adult: Secondary | ICD-10-CM | POA: Diagnosis not present

## 2022-04-03 DIAGNOSIS — Z86718 Personal history of other venous thrombosis and embolism: Secondary | ICD-10-CM | POA: Insufficient documentation

## 2022-04-03 DIAGNOSIS — I200 Unstable angina: Secondary | ICD-10-CM | POA: Diagnosis present

## 2022-04-03 DIAGNOSIS — E669 Obesity, unspecified: Secondary | ICD-10-CM | POA: Insufficient documentation

## 2022-04-03 HISTORY — PX: LEFT HEART CATH AND CORONARY ANGIOGRAPHY: CATH118249

## 2022-04-03 HISTORY — PX: CORONARY IMAGING/OCT: CATH118326

## 2022-04-03 HISTORY — PX: CORONARY STENT INTERVENTION: CATH118234

## 2022-04-03 HISTORY — PX: CORONARY ATHERECTOMY: CATH118238

## 2022-04-03 HISTORY — PX: CORONARY PRESSURE/FFR STUDY: CATH118243

## 2022-04-03 LAB — POCT ACTIVATED CLOTTING TIME
Activated Clotting Time: 266 seconds
Activated Clotting Time: 396 seconds

## 2022-04-03 SURGERY — LEFT HEART CATH AND CORONARY ANGIOGRAPHY
Anesthesia: LOCAL

## 2022-04-03 MED ORDER — HEPARIN (PORCINE) IN NACL 1000-0.9 UT/500ML-% IV SOLN
INTRAVENOUS | Status: AC
  Filled 2022-04-03: qty 500

## 2022-04-03 MED ORDER — SODIUM CHLORIDE 0.9 % WEIGHT BASED INFUSION: 1.0000 mL/kg/h | INTRAVENOUS | Status: DC

## 2022-04-03 MED ORDER — HEPARIN SODIUM (PORCINE) 1000 UNIT/ML IJ SOLN
INTRAMUSCULAR | Status: AC
  Filled 2022-04-03: qty 10

## 2022-04-03 MED ORDER — ASPIRIN 81 MG PO CHEW: 81.0000 mg | CHEWABLE_TABLET | ORAL | Status: DC

## 2022-04-03 MED ORDER — ONDANSETRON HCL 4 MG/2ML IJ SOLN
INTRAMUSCULAR | Status: AC
  Filled 2022-04-03: qty 2

## 2022-04-03 MED ORDER — SODIUM CHLORIDE 0.9% FLUSH: 3.0000 mL | INTRAVENOUS | Status: DC | PRN

## 2022-04-03 MED ORDER — CLOPIDOGREL BISULFATE 300 MG PO TABS
ORAL_TABLET | ORAL | Status: DC | PRN
  Administered 2022-04-03: 600 mg via ORAL

## 2022-04-03 MED ORDER — ADENOSINE 12 MG/4ML IV SOLN
INTRAVENOUS | Status: AC
  Filled 2022-04-03: qty 16

## 2022-04-03 MED ORDER — ADENOSINE (DIAGNOSTIC) 140MCG/KG/MIN
INTRAVENOUS | Status: DC | PRN
  Administered 2022-04-03: 140 ug/kg/min via INTRAVENOUS

## 2022-04-03 MED ORDER — HEPARIN (PORCINE) IN NACL 1000-0.9 UT/500ML-% IV SOLN
INTRAVENOUS | Status: DC | PRN
  Administered 2022-04-03 (×2): 500 mL

## 2022-04-03 MED ORDER — HYDRALAZINE HCL 20 MG/ML IJ SOLN: 10.0000 mg | INTRAMUSCULAR | Status: AC | PRN

## 2022-04-03 MED ORDER — ASPIRIN 81 MG PO CHEW
81.0000 mg | CHEWABLE_TABLET | Freq: Every day | ORAL | 0 refills | Status: DC
  Filled 2022-04-03: qty 30, 30d supply, fill #0

## 2022-04-03 MED ORDER — LIDOCAINE HCL (PF) 1 % IJ SOLN
INTRAMUSCULAR | Status: AC
  Filled 2022-04-03: qty 30

## 2022-04-03 MED ORDER — ACETAMINOPHEN 325 MG PO TABS
650.0000 mg | ORAL_TABLET | ORAL | Status: DC | PRN
  Administered 2022-04-03: 650 mg via ORAL
  Filled 2022-04-03: qty 2

## 2022-04-03 MED ORDER — SODIUM CHLORIDE 0.9 % IV SOLN: 250.0000 mL | INTRAVENOUS | Status: DC | PRN

## 2022-04-03 MED ORDER — CLOPIDOGREL BISULFATE 300 MG PO TABS
ORAL_TABLET | ORAL | Status: AC
  Filled 2022-04-03: qty 3

## 2022-04-03 MED ORDER — LABETALOL HCL 5 MG/ML IV SOLN: 10.0000 mg | INTRAVENOUS | Status: AC | PRN

## 2022-04-03 MED ORDER — ONDANSETRON HCL 4 MG/2ML IJ SOLN
INTRAMUSCULAR | Status: DC | PRN
  Administered 2022-04-03: 4 mg via INTRAVENOUS

## 2022-04-03 MED ORDER — SODIUM CHLORIDE 0.9% FLUSH: 3.0000 mL | Freq: Two times a day (BID) | INTRAVENOUS | Status: DC

## 2022-04-03 MED ORDER — HEPARIN SODIUM (PORCINE) 1000 UNIT/ML IJ SOLN
INTRAMUSCULAR | Status: DC | PRN
  Administered 2022-04-03: 2000 [IU] via INTRAVENOUS
  Administered 2022-04-03 (×2): 5000 [IU] via INTRAVENOUS

## 2022-04-03 MED ORDER — MIDAZOLAM HCL 2 MG/2ML IJ SOLN
INTRAMUSCULAR | Status: DC | PRN
  Administered 2022-04-03: 1 mg via INTRAVENOUS
  Administered 2022-04-03: 2 mg via INTRAVENOUS

## 2022-04-03 MED ORDER — ASPIRIN 81 MG PO CHEW: 81.0000 mg | CHEWABLE_TABLET | Freq: Every day | ORAL | Status: DC

## 2022-04-03 MED ORDER — MIDAZOLAM HCL 2 MG/2ML IJ SOLN
INTRAMUSCULAR | Status: AC
  Filled 2022-04-03: qty 2

## 2022-04-03 MED ORDER — AMLODIPINE BESYLATE 5 MG PO TABS
2.5000 mg | ORAL_TABLET | Freq: Every day | ORAL | 1 refills | Status: DC
  Filled 2022-04-03: qty 30, 60d supply, fill #0

## 2022-04-03 MED ORDER — VERAPAMIL HCL 2.5 MG/ML IV SOLN: INTRAVENOUS | Status: DC | PRN

## 2022-04-03 MED ORDER — LIDOCAINE HCL (PF) 1 % IJ SOLN
INTRAMUSCULAR | Status: DC | PRN
  Administered 2022-04-03: 2 mL

## 2022-04-03 MED ORDER — FENTANYL CITRATE (PF) 100 MCG/2ML IJ SOLN
INTRAMUSCULAR | Status: DC | PRN
  Administered 2022-04-03 (×2): 25 ug via INTRAVENOUS

## 2022-04-03 MED ORDER — CLOPIDOGREL BISULFATE 75 MG PO TABS: 75.0000 mg | ORAL_TABLET | Freq: Every day | ORAL | Status: DC

## 2022-04-03 MED ORDER — NITROGLYCERIN 1 MG/10 ML FOR IR/CATH LAB
INTRA_ARTERIAL | Status: DC | PRN
  Administered 2022-04-03 (×3): 200 ug via INTRACORONARY

## 2022-04-03 MED ORDER — VERAPAMIL HCL 2.5 MG/ML IV SOLN
INTRAVENOUS | Status: AC
  Filled 2022-04-03: qty 2

## 2022-04-03 MED ORDER — CLOPIDOGREL BISULFATE 75 MG PO TABS
75.0000 mg | ORAL_TABLET | Freq: Every day | ORAL | 1 refills | Status: DC
  Filled 2022-04-03: qty 90, 90d supply, fill #0

## 2022-04-03 MED ORDER — FENTANYL CITRATE (PF) 100 MCG/2ML IJ SOLN
INTRAMUSCULAR | Status: AC
  Filled 2022-04-03: qty 2

## 2022-04-03 MED ORDER — NITROGLYCERIN 1 MG/10 ML FOR IR/CATH LAB
INTRA_ARTERIAL | Status: AC
  Filled 2022-04-03: qty 10

## 2022-04-03 MED ORDER — ONDANSETRON HCL 4 MG/2ML IJ SOLN: 4.0000 mg | Freq: Four times a day (QID) | INTRAMUSCULAR | Status: DC | PRN

## 2022-04-03 MED ORDER — SODIUM CHLORIDE 0.9 % WEIGHT BASED INFUSION
3.0000 mL/kg/h | INTRAVENOUS | Status: AC
  Administered 2022-04-03: 3 mL/kg/h via INTRAVENOUS

## 2022-04-03 SURGICAL SUPPLY — 25 items
BALLN SAPPHIRE 2.5X20 (BALLOONS) ×1
BALLN ~~LOC~~ EMERGE MR 3.0X20 (BALLOONS) ×1
BALLN ~~LOC~~ EMERGE MR 3.5X12 (BALLOONS) ×1
BALLOON SAPPHIRE 2.5X20 (BALLOONS) IMPLANT
BALLOON ~~LOC~~ EMERGE MR 3.0X20 (BALLOONS) IMPLANT
BALLOON ~~LOC~~ EMERGE MR 3.5X12 (BALLOONS) IMPLANT
CATH DRAGONFLY OPSTAR (CATHETERS) IMPLANT
CATH INFINITI JR4 5F (CATHETERS) IMPLANT
CATH VISTA GUIDE 6FR XBLAD3.5 (CATHETERS) IMPLANT
CROWN DIAMONDBACK CLASSIC 1.25 (BURR) IMPLANT
DEVICE RAD COMP TR BAND LRG (VASCULAR PRODUCTS) IMPLANT
GLIDESHEATH SLEND SS 6F .021 (SHEATH) IMPLANT
GUIDEWIRE INQWIRE 1.5J.035X260 (WIRE) IMPLANT
GUIDEWIRE PRESSURE X 175 (WIRE) IMPLANT
INQWIRE 1.5J .035X260CM (WIRE) ×1
KIT ENCORE 26 ADVANTAGE (KITS) IMPLANT
KIT HEART LEFT (KITS) ×1 IMPLANT
LUBRICANT VIPERSLIDE CORONARY (MISCELLANEOUS) IMPLANT
PACK CARDIAC CATHETERIZATION (CUSTOM PROCEDURE TRAY) ×1 IMPLANT
SHEATH PROBE COVER 6X72 (BAG) IMPLANT
STENT ONYX FRONTIER 3.0X34 (Permanent Stent) IMPLANT
TRANSDUCER W/STOPCOCK (MISCELLANEOUS) ×1 IMPLANT
TUBING CIL FLEX 10 FLL-RA (TUBING) ×1 IMPLANT
WIRE ASAHI PROWATER 180CM (WIRE) IMPLANT
WIRE VIPERWIRE COR FLEX .012 (WIRE) IMPLANT

## 2022-04-03 NOTE — Discharge Instructions (Addendum)

## 2022-04-03 NOTE — Interval H&P Note (Signed)
History and Physical Interval Note:  04/03/2022 7:24 AM  Meade Maw  has presented today for surgery, with the diagnosis of chest pain.  The various methods of treatment have been discussed with the patient and family. After consideration of risks, benefits and other options for treatment, the patient has consented to  Procedure(s): LEFT HEART CATH AND CORONARY ANGIOGRAPHY (N/A) as a surgical intervention.  The patient's history has been reviewed, patient examined, no change in status, stable for surgery.  I have reviewed the patient's chart and labs.  Questions were answered to the patient's satisfaction.   Cath Lab Visit (complete for each Cath Lab visit)  Clinical Evaluation Leading to the Procedure:   ACS: No.  Non-ACS:    Anginal Classification: CCS III  Anti-ischemic medical therapy: Maximal Therapy (2 or more classes of medications)  Non-Invasive Test Results: Intermediate-risk stress test findings: cardiac mortality 1-3%/year  Prior CABG: No previous CABG        Collier [facility] 04/03/2022 7:24 AM

## 2022-04-03 NOTE — Progress Notes (Signed)
Pt was educated on stent card, stent location, Plavix and ASA use, wt restrictions, no baths/daily wash-ups, s/s of infection, ex guidelines (progressive walking and resistance exercise), s/s to stop exercising, NTG use and calling 911, heart healthy diet, and CRPII. Pt received materials on exercise, diet, and CRPII. Will refer to [facility].   ,me 04/03/2022 1:39 PM   [id number]

## 2022-04-03 NOTE — Discharge Summary (Signed)
Discharge Summary for Same Day PCI   Patient ID: Diane Barker MRN: [id number]; DOB: 1951/03/07  Admit date: 04/03/2022 Discharge date: 04/03/2022  Primary Care Provider: Shon Baton, MD  Primary Cardiologist: Elouise Munroe, MD  Primary Electrophysiologist:  None   Discharge Diagnoses    Active Problems:   Unstable angina [facility])  Diagnostic Studies/Procedures    Cardiac Catheterization 04/03/2022:    Ost LAD to Prox LAD lesion is 10% stenosed.   Prox Cx to Mid Cx lesion is 30% stenosed.   Ost RCA to Prox RCA lesion is 20% stenosed.   Mid RCA lesion is 20% stenosed.   Ost 1st Diag lesion is 30% stenosed.   Ost RPDA to RPDA lesion is 20% stenosed.   Ost 2nd Diag lesion is 30% stenosed.   Mid LAD lesion is 60% stenosed.   Prox LAD lesion is 65% stenosed.   A drug-eluting stent was successfully placed using a STENT ONYX FRONTIER 3.0X34.   Post intervention, there is a 0% residual stenosis.   Post intervention, there is a 0% residual stenosis.   The left ventricular systolic function is normal.   LV end diastolic pressure is mildly elevated.   The left ventricular ejection fraction is 55-65% by visual estimate.   Single vessel obstructive CAD involving the mid LAD which is heavily calcified. Flow analysis suggest no fixed microvascular disease but abnormal CFR suggesting significant vasoreactivity abnormality.  So she has both epicardial disease and abnormal vasoreactivity.  Normal LV function Mildly elevated LVEDP Successful PCI of the mid LAD with OCT guidance and DES x 1   Plan: recommend DAPT with ASA 81 mg daily for one month. Plavix 75 mg daily for 6 months. May resume Xarelto tonight. Anticipate same day DC. Recommend intensive vasodilator therapy with nitrates and calcium channel blocker.   Diagnostic Dominance: Right  Intervention     _____________   History of Present Illness     Diane Barker is a 72 y.o. female with past medical history of nonobstructive  coronary artery disease, GERD, hyperlipidemia, hypertension, obesity, DVT and bilateral PE on chronic Xarelto who presented to the office for outpatient visit regarding chest pain.  At this visit she reported intermittent chest pain which were concerning with episodes of unstable angina.  Given her symptoms she was set up for outpatient cardiac catheterization.  Hospital Course     The patient underwent cardiac cath as noted above with single-vessel obstructive CAD involving mid LAD with CFR suggesting significant vasoreactivity abnormality.  Did to have both epicardial disease and abnormal vasoreactivity.  Underwent successful PCI/DES x 1 to the mid LAD with OTC guidance. Plan for DAPT with ASA/Plavix/Xarelto for 1 month, then Plavix and Xarelto for at least 6 months. The patient was seen by cardiac rehab while in short stay. There were no observed complications post cath. Radial cath site was re-evaluated prior to discharge and found to be stable without any complications. Instructions/precautions regarding cath site care were given prior to discharge.  Diane Barker was seen by Dr. Martinique and determined stable for discharge home. Follow up with our office has been arranged. Medications are listed below. Pertinent changes include addition of Plavix, Norvasc.    _____________  Cath/PCI Registry Performance & Quality Measures: Aspirin prescribed? - Yes ADP Receptor Inhibitor (Plavix/Clopidogrel, Brilinta/Ticagrelor or Effient/Prasugrel) prescribed (includes medically managed patients)? - Yes High Intensity Statin (Lipitor 40-'80mg'$  or Crestor 20-'40mg'$ ) prescribed? - Yes For EF <40%, was ACEI/ARB prescribed? - Not Applicable (EF >/=  40%) For EF <40%, Aldosterone Antagonist (Spironolactone or Eplerenone) prescribed? - Not Applicable (EF >/= 96%) Cardiac Rehab Phase II ordered (Included Medically managed Patients)? - Yes  _____________   Discharge Vitals Blood pressure 120/74, pulse 63, temperature  (!) 97.1 F (36.2 C), temperature source Temporal, resp. rate 17, height 5' 5.5" (1.664 m), weight 108.9 kg, SpO2 98 %.  Filed Weights   04/03/22 0605  Weight: 108.9 kg    Last Labs & Radiologic Studies    CBC No results for input(s): "WBC", "NEUTROABS", "HGB", "HCT", "MCV", "PLT" in the last 72 hours. Basic Metabolic Panel No results for input(s): "NA", "K", "CL", "CO2", "GLUCOSE", "BUN", "CREATININE", "CALCIUM", "MG", "PHOS" in the last 72 hours. Liver Function Tests No results for input(s): "AST", "ALT", "ALKPHOS", "BILITOT", "PROT", "ALBUMIN" in the last 72 hours. No results for input(s): "LIPASE", "AMYLASE" in the last 72 hours. High Sensitivity Troponin:   No results for input(s): "TROPONINIHS" in the last 720 hours.  BNP Invalid input(s): "POCBNP" D-Dimer No results for input(s): "DDIMER" in the last 72 hours. Hemoglobin A1C No results for input(s): "HGBA1C" in the last 72 hours. Fasting Lipid Panel No results for input(s): "CHOL", "HDL", "LDLCALC", "TRIG", "CHOLHDL", "LDLDIRECT" in the last 72 hours. Thyroid Function Tests No results for input(s): "TSH", "T4TOTAL", "T3FREE", "THYROIDAB" in the last 72 hours.  Invalid input(s): "FREET3" _____________  CARDIAC CATHETERIZATION  Result Date: 04/03/2022   Ost LAD to Prox LAD lesion is 10% stenosed.   Prox Cx to Mid Cx lesion is 30% stenosed.   Ost RCA to Prox RCA lesion is 20% stenosed.   Mid RCA lesion is 20% stenosed.   Ost 1st Diag lesion is 30% stenosed.   Ost RPDA to RPDA lesion is 20% stenosed.   Ost 2nd Diag lesion is 30% stenosed.   Mid LAD lesion is 60% stenosed.   Prox LAD lesion is 65% stenosed.   A drug-eluting stent was successfully placed using a STENT ONYX FRONTIER 3.0X34.   Post intervention, there is a 0% residual stenosis.   Post intervention, there is a 0% residual stenosis.   The left ventricular systolic function is normal.   LV end diastolic pressure is mildly elevated.   The left ventricular ejection  fraction is 55-65% by visual estimate. Single vessel obstructive CAD involving the mid LAD which is heavily calcified. Flow analysis suggest no fixed microvascular disease but abnormal CFR suggesting significant vasoreactivity abnormality.  So she has both epicardial disease and abnormal vasoreactivity. Normal LV function Mildly elevated LVEDP Successful PCI of the mid LAD with OCT guidance and DES x 1 Plan: recommend DAPT with ASA 81 mg daily for one month. Plavix 75 mg daily for 6 months. May resume Xarelto tonight. Anticipate same day DC. Recommend intensive vasodilator therapy with nitrates and calcium channel blocker.    Disposition   Pt is being discharged home today in good condition.  Follow-up Plans & Appointments     Follow-up Information     Martinique, Peter M, MD Follow up on 04/25/2022.   Specialty: Cardiology Why: at 9:40am for your follow up appt Contact information: [address] [place] [address] [phone]                Discharge Instructions     AMB Referral to Cardiac Rehabilitation - Phase II   Complete by: As directed    Diagnosis: Coronary Stents   After initial evaluation and assessments completed: Virtual Based Care may be provided alone or in  conjunction with Phase 2 Cardiac Rehab based on patient barriers.: Yes   Intensive Cardiac Rehabilitation (ICR) [place] location only OR Traditional Cardiac Rehabilitation (TCR) *If criteria for ICR are not met will enroll in TCR [facility] only): Yes        Discharge Medications   Allergies as of 04/03/2022       Reactions   E.e.s. [erythromycin] Other (See Comments)   (Higher doses) stomach cramps   Lipitor [atorvastatin]    High doses cause fatigued myalgias   Morphine And Related Other (See Comments)   hallucinations   Neurontin [gabapentin] Other (See Comments)   Causes blackouts   Omnicef [cefdinir] Itching   Penicillins Other (See Comments)   Unknown; childhood reaction.   Relafen  [nabumetone] Nausea Only        Medication List     TAKE these medications    acetaminophen 500 MG tablet Commonly known as: TYLENOL Take 500-1,000 mg by mouth every 6 (six) hours as needed for mild pain.   AeroChamber MV inhaler Use as instructed   amLODipine 5 MG tablet Commonly known as: NORVASC Take 0.5 tablets (2.5 mg total) by mouth daily.   aspirin 81 MG chewable tablet Chew 1 tablet (81 mg total) by mouth daily. Start taking on: April 04, 2022   atorvastatin 40 MG tablet Commonly known as: LIPITOR TAKE 1 TABLET BY MOUTH EVERY DAY   clopidogrel 75 MG tablet Commonly known as: PLAVIX Take 1 tablet (75 mg total) by mouth daily with breakfast. Start taking on: April 04, 2022   dexlansoprazole 60 MG capsule Commonly known as: DEXILANT Take 60 mg by mouth in the morning.   escitalopram 10 MG tablet Commonly known as: LEXAPRO Take 10 mg by mouth in the morning.   isosorbide mononitrate 60 MG 24 hr tablet Commonly known as: IMDUR Take 1.5 tablets (90 mg total) by mouth daily.   nitroGLYCERIN 0.4 MG SL tablet Commonly known as: NITROSTAT Place 1 tablet (0.4 mg total) under the tongue every 5 (five) minutes as needed.   ProAir HFA 108 (90 Base) MCG/ACT inhaler Generic drug: albuterol Inhale 2 puffs into the lungs every 4 (four) hours as needed for wheezing or shortness of breath.   rivaroxaban 20 MG Tabs tablet Commonly known as: XARELTO Take 20 mg by mouth in the morning.        Allergies Allergies  Allergen Reactions   E.E.S. [Erythromycin] Other (See Comments)    (Higher doses) stomach cramps   Lipitor [Atorvastatin]     High doses cause fatigued myalgias   Morphine And Related Other (See Comments)    hallucinations   Neurontin [Gabapentin] Other (See Comments)    Causes blackouts   Omnicef [Cefdinir] Itching   Penicillins Other (See Comments)    Unknown; childhood reaction.   Relafen [Nabumetone] Nausea Only    Outstanding  Labs/Studies   N/a   Duration of Discharge Encounter   Greater than 30 minutes including physician time.  Signed, Reino Bellis, NP 04/03/2022, 1:32 PM

## 2022-04-04 ENCOUNTER — Encounter (HOSPITAL_COMMUNITY): Payer: Self-pay | Admitting: Cardiology

## 2022-04-07 ENCOUNTER — Other Ambulatory Visit (HOSPITAL_COMMUNITY): Payer: Self-pay

## 2022-04-09 ENCOUNTER — Other Ambulatory Visit (HOSPITAL_COMMUNITY): Payer: Self-pay

## 2022-04-09 ENCOUNTER — Telehealth: Payer: Self-pay | Admitting: Internal Medicine

## 2022-04-09 MED ORDER — AMLODIPINE BESYLATE 2.5 MG PO TABS: 2.5000 mg | ORAL_TABLET | Freq: Every day | ORAL | 1 refills | Status: DC

## 2022-04-09 NOTE — Telephone Encounter (Signed)
Returned call to patient who states that she cannot cut her amlodipine tablet in half to be able to take 2.'5mg'$ . Patient states that her hand/arm is still sore/bruised from the heart cath. Patient states this is getting better but is going very slowly. Patient denies any bleeding/new bruising/swelling. New prescription sent to patient preferred pharmacy for Amlodipine 2.'5mg'$  tablet once daily. Advised patient to call back to office with any issues, questions, or concerns. Patient verbalized understanding.

## 2022-04-09 NOTE — Telephone Encounter (Signed)
Pt c/o medication issue:  1. Name of Medication:  amLODipine (NORVASC) 5 MG tablet  2. How are you currently taking this medication (dosage and times per day)?   3. Are you having a reaction (difficulty breathing--STAT)?   4. What is your medication issue?    Patient states she was prescribed this medication after 1/18 cath and advised to cut it in half. However, she states that she is unable to do so due to a lack of strength in her arm. She states she is even unable to do so using her pill cutter. She also mentions that her daughter is unable to split it in half also.

## 2022-04-09 NOTE — Telephone Encounter (Signed)
Attempted to call patient, left message for patient to call back to office.   

## 2022-04-10 ENCOUNTER — Telehealth (HOSPITAL_COMMUNITY): Payer: Self-pay

## 2022-04-10 NOTE — Telephone Encounter (Signed)
Attempted to call patient in regards to Cardiac Rehab - LM on VM 

## 2022-04-10 NOTE — Telephone Encounter (Signed)
Pt insurance is active and benefits verified through [facility] Medicare/Dual Complete. Co-pay $0.00, DED $240.00/$186.60 met, out of pocket $8,850.00/$186.60 met, co-insurance 20%. No pre-authorization required. Passport, 04/10/22 @ 3:37PM, WIO#[id number]   How many CR sessions are covered? (36 sessions for TCR, 72 sessions for ICR)72 Is this a lifetime maximum or an annual maximum? Lifetime Has the member used any of these services to date? No Is there a time limit (weeks/months) on start of program and/or program completion? No   2ndary insurance is active and benefits verified through Medicaid.Marland Kitchen Co-pay $0.00, DED $0.00/$0.00 met, out of pocket $0.00/$0.00 met, co-insurance 0%. No pre-authorization required. Passport, 04/10/22 @ 3:41PM, WOE#[id number]      Will contact patient to see if she is interested in the Cardiac Rehab Program. If interested, patient will need to complete follow up appt. Once completed, patient will be contacted for scheduling upon review by the RN Navigator.

## 2022-04-14 ENCOUNTER — Telehealth: Payer: Self-pay | Admitting: Cardiology

## 2022-04-14 NOTE — Telephone Encounter (Signed)
Martinique, Peter M, MD  Fidel Levy, RN Caller: Unspecified (Today, 12:45 PM) Agree. Anticipate Plavix for 6 months at least at which point could consider Xarelto monotherapy but needs both for now. Imdur and amlodipine both indicated for vasoreactive disease  Peter Martinique MD, [facility]

## 2022-04-14 NOTE — Telephone Encounter (Signed)
Spoke with patient of Dr. Martinique who is concerned that she is on plavix and xarelto - explained that meds are different and work differently. She also expresses concern about being on amlodipine and imdur - explained that these are different classes of meds and work differently. No further questions/assistance needed.

## 2022-04-14 NOTE — Telephone Encounter (Signed)
Pt c/o medication issue:  1. Name of Medication: clopidogrel (PLAVIX) 75 MG tablet rivaroxaban (XARELTO) 20 MG TABS tablet  2. How are you currently taking this medication (dosage and times per day)? As prescribed  3. Are you having a reaction (difficulty breathing--STAT)? Yes  4. What is your medication issue? Patient states that she is feeling cold all over and believes she may be taking too much blood thinner and would like to discuss this. Please advise.

## 2022-04-16 NOTE — Progress Notes (Signed)
Cardiology Office Note:  Interventional Cardiology    Date:  04/25/2022   ID:  Diane Barker, DOB 1950-07-31, MRN [id number]  PCP:  Shon Baton, MD   [facility] Cardiologist:  Elouise Munroe, MD     Referring MD: Shon Baton, MD   Chief Complaint  Patient presents with   Coronary Artery Disease    History of Present Illness:    Diane Barker is a 72 y.o. female is seen at the request of Dr Margaretann Loveless for evaluation of angina. She has a history of breast CA s/p chemo and radiation therapy. History of asthma and HLD. Remote history of DVT and PE in 2015. She was initially evaluated for chest pain in 2019. Coronary CTA at that time suggested significant obstructive disease in the mid LAD with abnormal FFR. Subsequent cardiac cath showed 60% calcified stenosis in the LAD with normal DFR and FFR at that time. Medical therapy recommended. 2 years ago she had repeat cardiac cath showing no change. Flow analysis not done at the time. She had a normal CPX and Echo this past year.   The patient reports since October she has increased chest pain. Pain is located in left upper anterior chest radiating into left neck posteriorly and into the upper left arm. It is triggered by stress. Not exertional. May occur when playing games on her phone or sitting in recliner. She does have a lot of family stressors. It is relieved with sl Ntg and sometimes Tylenol. Denies symptoms at night. She is fatigued a lot. Apparently did not tolerate beta blocker in the past.   She did undergo repeat cardiac cath due to refractory symptoms. Flow assessment was made. Her proximal to mid LAD was stented. She has normal IMR with low CFR c/w vasoactive disease as well. She is seen today for follow up.   She notes significant improvement in her chest pain since the procedure. Only 2 episodes of pain < 1 second.  She still is fatigued. Wants to sleep a a lot. Notes some minor bruising. Stays cold.   Past  Medical History:  Diagnosis Date   Anxiety    Arthritis    "all over my body" (01/15/2018)   Breast cancer, right breast ([place]) 2000   Rt lumpectomy, Chemo/XRT/rt axillary node    Bronchial asthma    Chronic lower back pain    Depression    Diverticulosis    DVT (deep venous thrombosis) ([place]) 06/2013   RLE   GERD (gastroesophageal reflux disease)    High cholesterol    Hx of adenomatous colonic polyps    Hypertension    Obesity    Osteoarthritis    Personal history of chemotherapy    Personal history of radiation therapy    Pulmonary embolism ([place]) 06/2013   "both lungs"   Spondylolisthesis     Past Surgical History:  Procedure Laterality Date   BREAST BIOPSY Right 2000   BREAST LUMPECTOMY Right 2000   for CA txed w/ chemo and radiation, right   CARDIAC CATHETERIZATION  03/21/2020   CORONARY ATHERECTOMY N/A 04/03/2022   Procedure: CORONARY ATHERECTOMY;  Surgeon: Martinique, Oluwateniola Leitch M, MD;  Location: [facility];  Service: Cardiovascular;  Laterality: N/A;   CORONARY STENT INTERVENTION N/A 04/03/2022   Procedure: CORONARY STENT INTERVENTION;  Surgeon: Martinique, Tiffny Gemmer M, MD;  Location: [facility];  Service: Cardiovascular;  Laterality: N/A;   HERNIA REPAIR     INTRAVASCULAR IMAGING/OCT N/A 04/03/2022  Procedure: INTRAVASCULAR IMAGING/OCT;  Surgeon: Martinique, Iowa Kappes M, MD;  Location: [facility];  Service: Cardiovascular;  Laterality: N/A;   INTRAVASCULAR PRESSURE WIRE/FFR STUDY N/A 01/18/2018   Procedure: INTRAVASCULAR PRESSURE WIRE/FFR STUDY;  Surgeon: Burnell Blanks, MD;  Location: [facility];  Service: Cardiovascular;  Laterality: N/A;   INTRAVASCULAR PRESSURE WIRE/FFR STUDY N/A 04/03/2022   Procedure: INTRAVASCULAR PRESSURE WIRE/FFR STUDY;  Surgeon: Martinique, Kamau Weatherall M, MD;  Location: [facility];  Service: Cardiovascular;  Laterality: N/A;   LEFT HEART CATH AND CORONARY ANGIOGRAPHY N/A 01/18/2018   Procedure: LEFT HEART CATH AND CORONARY  ANGIOGRAPHY;  Surgeon: Burnell Blanks, MD;  Location: [facility];  Service: Cardiovascular;  Laterality: N/A;   LEFT HEART CATH AND CORONARY ANGIOGRAPHY N/A 03/21/2020   Procedure: LEFT HEART CATH AND CORONARY ANGIOGRAPHY;  Surgeon: Wellington Hampshire, MD;  Location: [facility];  Service: Cardiovascular;  Laterality: N/A;   LEFT HEART CATH AND CORONARY ANGIOGRAPHY N/A 04/03/2022   Procedure: LEFT HEART CATH AND CORONARY ANGIOGRAPHY;  Surgeon: Martinique, Horace Wishon M, MD;  Location: [facility];  Service: Cardiovascular;  Laterality: N/A;   TUBAL LIGATION     UMBILICAL HERNIA REPAIR  1983    Current Medications: Current Meds  Medication Sig   acetaminophen (TYLENOL) 500 MG tablet Take 500-1,000 mg by mouth every 6 (six) hours as needed for mild pain.   amLODipine (NORVASC) 2.5 MG tablet Take 1 tablet (2.5 mg total) by mouth daily.   aspirin 81 MG chewable tablet Chew 1 tablet (81 mg total) by mouth daily.   atorvastatin (LIPITOR) 40 MG tablet TAKE 1 TABLET BY MOUTH EVERY DAY   clopidogrel (PLAVIX) 75 MG tablet Take 1 tablet (75 mg total) by mouth daily with breakfast.   dexlansoprazole (DEXILANT) 60 MG capsule Take 60 mg by mouth in the morning.   escitalopram (LEXAPRO) 10 MG tablet Take 10 mg by mouth in the morning.   isosorbide mononitrate (IMDUR) 60 MG 24 hr tablet Take 1.5 tablets (90 mg total) by mouth daily.   nitroGLYCERIN (NITROSTAT) 0.4 MG SL tablet Place 1 tablet (0.4 mg total) under the tongue every 5 (five) minutes as needed.   PROAIR HFA 108 (90 Base) MCG/ACT inhaler Inhale 2 puffs into the lungs every 4 (four) hours as needed for wheezing or shortness of breath.   rivaroxaban (XARELTO) 20 MG TABS tablet Take 20 mg by mouth in the morning.   Spacer/Aero-Holding Chambers (AEROCHAMBER MV) inhaler Use as instructed     Allergies:   E.e.s. [erythromycin], Lipitor [atorvastatin], Morphine and related, Neurontin [gabapentin], Omnicef [cefdinir], Penicillins, and  Relafen [nabumetone]   Social History   Socioeconomic History   Marital status: Single    Spouse name: Not on file   Number of children: 2   Years of education: 10   Highest education level: Not on file  Occupational History   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: [employer]  Tobacco Use   Smoking status: Never   Smokeless tobacco: Never  Vaping Use   Vaping Use: Never used  Substance and Sexual Activity   Alcohol use: Never    Alcohol/week: 0.0 standard drinks of alcohol   Drug use: Never   Sexual activity: Not Currently  Other Topics Concern   Not on file  Social History Narrative   ** Merged History Encounter **       Lives at home alone Caffeine use-  soda 16-24 oz weekly   Social Determinants of Health   Financial Resource Strain: Not on file  Food Insecurity: Not on file  Transportation Needs: Not on file  Physical Activity: Not on file  Stress: Not on file  Social Connections: Not on file     Family History: The patient's family history includes Asthma in her daughter and mother; Breast cancer in her maternal aunt; COPD in her mother; Colon cancer in her paternal aunt; Diabetes in her brother, daughter, and mother; Heart attack in her brother and mother; Hypertension in her brother; Kidney failure in her brother; Lung cancer in her father.  ROS:   Please see the history of present illness.     All other systems reviewed and are negative.  EKGs/Labs/Other Studies Reviewed:    The following studies were reviewed today:  Coronary CTA: 01/16/18: Cardiac/Coronary  CT   TECHNIQUE: The patient was scanned on a [employer].   PROTOCOL: A 120 kV prospective scan was triggered in the descending thoracic aorta at 111 HU's. Axial non-contrast 3 mm slices were carried out through the heart. The data set was analyzed on a dedicated work station and scored using the [place]. Gantry rotation speed was 250 msecs and  collimation was .6 mm. No beta blockade and 0.8 mg of sl NTG was given. The 3D data set was reconstructed in 5% intervals of the 67-82 % of the R-R cycle. Diastolic phases were analyzed on a dedicated work station using MPR, MIP and VRT modes. The patient received 80 cc of contrast.   FINDINGS: Coronary calcium score: The patient's coronary artery calcium score is 455, which places the patient in the 93 percentile. This is higher than expected for age and gender matched peers.   Coronary arteries: Normal coronary origins.  Right dominance.   Right Coronary Artery: Minimal mixed atherosclerotic plaque in the proximal, mid and distal RCA (<25% stenosis). Patent medium caliber PDA and patent small caliber PL branch.   Left Main Coronary Artery: Minimal irregular plaque (<25% stenosis)   Left Anterior Descending Coronary Artery: There is a moderate heavily calcified plaque from proximal to mid LAD with 50-69% stenosis. There is a severe mixed atherosclerotic plaque in the mid LAD, 70-99% stenosis. There is a moderate calcified lesion immediately distal, and the distal LAD diffusely disease with mild atherosclerotic plaque (25-49% stenosis). Patent small caliber diagonal branches.   Left Circumflex Artery: Minimal scattered plaque (<25% stenosis) in the circumflex artery. There is a mild mixed atherosclerotic plaque at the ostium of OM1 (25-49% stenosis). Patent distal vessel.   Aorta: Normal size. No calcifications. No dissection. Likely mildly tortuous aorta, arch not imaged for sidedness.   Aortic Valve: No calcifications.   Other findings:   Normal pulmonary vein drainage into the left atrium.   Normal let atrial appendage without a thrombus.   Normal size of the pulmonary artery.   Mild mitral annular calcification.   IMPRESSION: 1. The patient's coronary artery calcium score is 455, which places the patient in the 93 percentile. This is higher than expected for age  and gender matched peers.   2. Normal coronary origin with right dominance.   3.  Severe CAD of the mid LAD, CADRADS = 4.     Electronically Signed   By: Cherlynn Kaiser   On: 01/16/2018 13:44  CT FFR ANALYSIS   CLINICAL DATA:  Unstable angina   FINDINGS: FFRct analysis was performed on the original cardiac CT angiogram dataset. Diagrammatic  representation of the FFRct analysis is provided in a separate PDF document in PACS. This dictation was created using the PDF document and an interactive 3D model of the results. 3D model is not available in the EMR/PACS. Normal FFR range is >0.80.   1. Left Main:  No significant stenosis. FFR = 0.98   2. LAD: No significant stenosis. Proximal FFR = 0.96, Mid FFR = 0.77, Distal FFR = 0.68 3. LCX: No significant stenosis. Proximal FFR = 0.95, Distal FFR = 0.84, OM1 FFR = 0.84 4. RCA: No significant stenosis. Proximal FFR = 0.97, Mid FFR = 0.94, Distal FFR = 0.90   IMPRESSION: 1. CT FFR analysis showed flow limiting stenosis in the mid and distal LAD, FFR = 0.77 after mid LAD lesion.     Electronically Signed   By: Cherlynn Kaiser   On: 01/17/2018 13:08       Event monitor 11/16/19: Indication: syncope   Minimum HR (bpm): 49 Maximum HR (bpm): 136   Supraventricular Ectopy: <1%, rare SVT: 12 seconds of SVT on 11/10/19 at 3:40 pm, no diary events   Ventricular Ectopy:  <1% NSVT: none Ventricular Tachycardia: none   Pauses: none AV block: none   Atrial fibrillation: none   Diary events: Symptoms of tired/fatigued, SOB, chest pain/pressure associated with sinus rhythm and sinus tachycardia with maximum rate 113 bpm. No definite ST deviation with chest pain or pressure symptom.    IMPRESSION: infrequent ectopy, brief episode of asymptomatic SVT. Symptoms associated with SR and sinus tachycardia.     Cardiac cath 03/21/20:  LEFT HEART CATH AND CORONARY ANGIOGRAPHY   Conclusion    Ost 1st Diag lesion is 30% stenosed. Ost  LAD to Prox LAD lesion is 10% stenosed. Ost 2nd Diag lesion is 50% stenosed. Mid LAD lesion is 40% stenosed. Prox LAD lesion is 65% stenosed. Prox Cx to Mid Cx lesion is 30% stenosed. Ost RCA to Prox RCA lesion is 20% stenosed. Ost RPDA to RPDA lesion is 20% stenosed. Mid RCA lesion is 20% stenosed. The left ventricular systolic function is normal. LV end diastolic pressure is normal. The left ventricular ejection fraction is 55-65% by visual estimate.   1.  Stable moderate one-vessel coronary artery disease with 60 to 70% stenosis in the proximal right coronary artery which is eccentric and calcified at the origin of the first diagonal.  The mid lesion seems to be better than before at 40%.  No evidence of obstructive disease. 2.  Normal LV systolic function normal left ventricular end-diastolic pressure.   Recommendations: There might be slight progression in the proximal LAD disease but still not enough to cause unstable angina and symptoms at rest.  Recommend continuing medical therapy.  If the patient starts having anginal symptoms on a regular basis, PCI of the LAD can be considered but will likely require atherectomy or intravascular lithotripsy and protection of the first diagonal. Repeat Xarelto tomorrow if no bleeding issues.   Avoid catheterization via the left radial artery in the future given inability to get the wire into the ascending aorta.  In the past, catheterization via the right radial artery was possible but also was difficult due to tortuosity of the innominate artery.    CPX 10/14/21: Procedure: This patient underwent staged symptom-limited exercise treadmill testing using a modified Naughton protocol with expired gas analysis metabolic evaluation during exercise.   Demographics   Age: 44 Ht. (in.) 65.5 Wt. (lb) 246.2 BMI: 40.3      Predicted Peak VO2:  14.1   Gender: Female Ht (cm) 166.4 Wt. (kg) 111.7    Results   Pre-Exercise PFTs   FVC 2.36 (80%)        FEV1 1.99 (88%)         FEV1/FVC 85  (108%)         MVV 73 (82%)     Exercise Time:    8:45   Speed (mph): 2.0       Grade (%): 10.5   RPE: 17   Reason stopped: dizziness (8/10)   Additional symptoms: dyspnea (5/10)   Resting HR: 63 Standing HR: 65 Peak HR: 115   (78% age predicted max HR)   BP rest: 138/76 Standing BP: 132/76 BP peak: 166/68   Peak VO2: 13.8 (95% predicted peak VO2)   VE/VCO2 slope:  33   OUES: 1.46   Peak RER: 1.22   Ventilatory Threshold: 10.1 (72% predicted or measured peak VO2)   Peak RR 51   Peak Ventilation:  61.0   VE/MVV:  91%   PETCO2 at peak:  33   O2pulse:  13   (111% predicted O2pulse)    Interpretation   Notes: Patient gave a very good effort. Pulse-oximetry remained 95-99% for the duration exercise.   ECG:  Resting ECG in normal sinus rhythm. HR response mostly appropriate. There were no sustained arrhythmias or ST-T changes. BP response appropriate.   PFT:  Pre-exercise spirometry was within normal limits. The MVV was normal.   CPX:  Exercise testing with gas exchange demonstrates a normal peak VO2 of 13.8 ml/kg/min (95% of the age/gender/weight matched sedentary norms). The RER of 1.22 indicates a maxima; effort. When adjusted to the patient's ideal body weight of 144.0 lb (65.3 kg) the peak VO2 is 22.9 ml/kg (ibw)/min (118% of the ibw-adjusted predicted). The VE/VCO2 slope is mildly elevated and indicates increased dead space ventilation. The oxygen uptake efficiency slope (OUES) is normal. The VO2 at the ventilatory threshold was normal at 72% of the predicted peak VO2. At peak exercise, the ventilation reached 91% of the measured MVV indicating ventilatory reserve was nearly depleted. The O2pulse (a surrogate for stroke volume) increased with incremental exercise, reaching peak at 13 ml/beat (111% predicted).    Conclusion: Exercise testing with gas exchange demonstrates normal functional capacity when compared to matched  sedentary norms. There is no indication for cardiopulmonary abnormality. Patient appears primarily ventilatory limited due to body habitus.    Test, report and preliminary impression by:  Kathy Breach, MS, ACSM-RCEP  10/14/2021 4:20 PM   Agree with above. No obvious cardiopulmonary abnormality except for mild chronotropic incompetence. Patient is limited by her obesity and related restrictive lung physiology.   Glori Bickers, MD  9:51 PM    Echo 11/19/21: IMPRESSIONS     1. Left ventricular ejection fraction, by estimation, is 55 to 60%. The  left ventricle has normal function. The left ventricle has no regional  wall motion abnormalities. Left ventricular diastolic parameters are  indeterminate.   2. Right ventricular systolic function is normal. The right ventricular  size is normal. Tricuspid regurgitation signal is inadequate for assessing  PA pressure.   3. Left atrial size was moderately dilated.   4. The mitral valve is degenerative. Mild mitral valve regurgitation. No  evidence of mitral stenosis.   5. The aortic valve is tricuspid. Aortic valve regurgitation is trivial.  No aortic stenosis is present.   Comparison(s): No significant change from prior study. 01/16/18 EF 55-60%.   Cardiac  cath/PCI 04/03/22: Procedures  INTRAVASCULAR IMAGING/OCT  CORONARY STENT INTERVENTION  CORONARY ATHERECTOMY  INTRAVASCULAR PRESSURE WIRE/FFR STUDY  LEFT HEART CATH AND CORONARY ANGIOGRAPHY   Conclusion      Ost LAD to Prox LAD lesion is 10% stenosed.   Prox Cx to Mid Cx lesion is 30% stenosed.   Ost RCA to Prox RCA lesion is 20% stenosed.   Mid RCA lesion is 20% stenosed.   Ost 1st Diag lesion is 30% stenosed.   Ost RPDA to RPDA lesion is 20% stenosed.   Ost 2nd Diag lesion is 30% stenosed.   Mid LAD lesion is 60% stenosed.   Prox LAD lesion is 65% stenosed.   A drug-eluting stent was successfully placed using a STENT ONYX FRONTIER 3.0X34.   Post intervention, there  is a 0% residual stenosis.   Post intervention, there is a 0% residual stenosis.   The left ventricular systolic function is normal.   LV end diastolic pressure is mildly elevated.   The left ventricular ejection fraction is 55-65% by visual estimate.   Single vessel obstructive CAD involving the mid LAD which is heavily calcified. Flow analysis suggest no fixed microvascular disease but abnormal CFR suggesting significant vasoreactivity abnormality.  So she has both epicardial disease and abnormal vasoreactivity.  Normal LV function Mildly elevated LVEDP Successful PCI of the mid LAD with OCT guidance and DES x 1   Plan: recommend DAPT with ASA 81 mg daily for one month. Plavix 75 mg daily for 6 months. May resume Xarelto tonight. Anticipate same day DC. Recommend intensive vasodilator therapy with nitrates and calcium channel blocker.   Coronary Diagrams  Diagnostic Dominance: Right  Intervention      EKG:  EKG is ordered today.  NSR rate 64. Nonspecific TWA. I have personally reviewed and interpreted this study.    Recent Labs: 03/28/2022: BUN 16; Creatinine, Ser 0.94; Hemoglobin 12.8; Platelets 223; Potassium 4.1; Sodium 142  Recent Lipid Panel    Component Value Date/Time   CHOL 130 03/22/2020 0042   TRIG 58 03/22/2020 0042   HDL 46 03/22/2020 0042   CHOLHDL 2.8 03/22/2020 0042   VLDL 12 03/22/2020 0042   LDLCALC 72 03/22/2020 0042   Lab dated 01/13/22: cholesterol 135, triglycerides 65, HDL 46, LDL 76. TSH normal A1c 5.3%.   Risk Assessment/Calculations:                Physical Exam:    VS:  BP 122/68 (BP Location: Left Arm, Patient Position: Sitting, Cuff Size: Large)   Pulse 64   Ht 5' 5.5" (1.664 m)   Wt 235 lb 12.8 oz (107 kg)   BMI 38.64 kg/m     Wt Readings from Last 3 Encounters:  04/25/22 235 lb 12.8 oz (107 kg)  04/03/22 240 lb (108.9 kg)  03/28/22 241 lb (109.3 kg)     GEN:  Well nourished, obese in no acute distress HEENT: Normal NECK:  No JVD; No carotid bruits LYMPHATICS: No lymphadenopathy CARDIAC: RRR, no murmurs, rubs, gallops RESPIRATORY:  Clear to auscultation without rales, wheezing or rhonchi  ABDOMEN: Soft, non-tender, non-distended MUSCULOSKELETAL:  No edema; No deformity radial cath site without hematoma. SKIN: Warm and dry NEUROLOGIC:  Alert and oriented x 3 PSYCHIATRIC:  becomes tearful when discussing her stress.   ASSESSMENT:    1. Coronary artery disease involving native coronary artery of native heart with refractory angina pectoris ([place])   2. Essential hypertension   3. History of DVT (deep vein thrombosis)  4. Hyperlipidemia, unspecified hyperlipidemia type     PLAN:    In order of problems listed above:  CAD - patient has known CAD. Mixed anginal symptoms occurring predominantly at rest in relation to stress.  She did have cervical CT this past year without significant cervical disc disease. Cardiac cath performed and she underwent PCI with stenting of the LAD. No significant microvascular disease on flow analysis but likely has vasoreactive component with spasm. This is managed with nitrates and amlodipine. She has significant improvement in symptoms today. Will continue ASA for one month, plavix for 6 months. Xarelto indefinitely. Will follow up with Dr Margaretann Loveless.  History of DVT/PE remote. On chronic Xarelto.  HTN HLD.      Cardiac Rehabilitation Eligibility Assessment       Medication Adjustments/Labs and Tests Ordered: Current medicines are reviewed at length with the patient today.  Concerns regarding medicines are outlined above.  No orders of the defined types were placed in this encounter.  No orders of the defined types were placed in this encounter.   There are no Patient Instructions on file for this visit.   Signed, Aliahna Statzer Martinique, MD  04/25/2022 9:39 AM    [place]

## 2022-04-21 ENCOUNTER — Other Ambulatory Visit: Payer: Self-pay | Admitting: Internal Medicine

## 2022-04-21 DIAGNOSIS — I25112 Atherosclerotic heart disease of native coronary artery with refractory angina pectoris: Secondary | ICD-10-CM

## 2022-04-25 ENCOUNTER — Encounter: Payer: Self-pay | Admitting: Cardiology

## 2022-04-25 ENCOUNTER — Ambulatory Visit: Payer: 59 | Attending: Cardiology | Admitting: Cardiology

## 2022-04-25 VITALS — BP 122/68 | HR 64 | Ht 65.5 in | Wt 235.8 lb

## 2022-04-25 DIAGNOSIS — Z86718 Personal history of other venous thrombosis and embolism: Secondary | ICD-10-CM | POA: Diagnosis not present

## 2022-04-25 DIAGNOSIS — E785 Hyperlipidemia, unspecified: Secondary | ICD-10-CM

## 2022-04-25 DIAGNOSIS — I25112 Atherosclerotic heart disease of native coronary artery with refractory angina pectoris: Secondary | ICD-10-CM | POA: Diagnosis not present

## 2022-04-25 DIAGNOSIS — I10 Essential (primary) hypertension: Secondary | ICD-10-CM | POA: Diagnosis not present

## 2022-04-25 MED ORDER — ISOSORBIDE MONONITRATE ER 60 MG PO TB24: 90.0000 mg | ORAL_TABLET | Freq: Every day | ORAL | 3 refills | Status: DC

## 2022-04-25 NOTE — Patient Instructions (Signed)
Medication Instructions:  Continue current medications  *If you need a refill on your cardiac medications before your next appointment, please call your pharmacy*   Lab Work: None Ordered   Testing/Procedures: None Ordered   Follow-Up: At [facility], you and your health needs are our priority.  As part of our continuing mission to provide you with exceptional heart care, we have created designated Provider Care Teams.  These Care Teams include your primary Cardiologist (physician) and Advanced Practice Providers (APPs -  Physician Assistants and Nurse Practitioners) who all work together to provide you with the care you need, when you need it.  We recommend signing up for the patient portal called "MyChart".  Sign up information is provided on this After Visit Summary.  MyChart is used to connect with patients for Virtual Visits (Telemedicine).  Patients are able to view lab/test results, encounter notes, upcoming appointments, etc.  Non-urgent messages can be sent to your provider as well.   To learn more about what you can do with MyChart, go to [email or web address].    Your next appointment:   Keep appointment on 06/06/22  Provider:   Elouise Munroe, MD     Other Instructions

## 2022-05-01 ENCOUNTER — Telehealth: Payer: Self-pay | Admitting: Internal Medicine

## 2022-05-01 NOTE — Telephone Encounter (Signed)
Pt stated that yesterday, she has 1 loose BM and vomiting. At that time, she felt like her chest was on fire, was weak and pale. She laid down for 15-20 minutes and felt better. She was not sweaty/clammy. Denies fever. She thinks that the chocolate she ate causes the loose stools. Has not contacted PCP. Recommended that if this happens again, To contact PCP. Does not check BP/P.

## 2022-05-01 NOTE — Telephone Encounter (Signed)
Pt c/o of Chest Pain: STAT if CP now or developed within 24 hours  1. Are you having CP right now? Pain in her chest yesterday- felt like it was burning like a fire- no pain today  2. Are you experiencing any other symptoms (ex. SOB, nausea, vomiting, sweating)? Nauseate, vomiting yesterday-   3. How long have you been experiencing CP? Yesterday was the first time  4. Is your CP continuous or coming and going? It was constant while it happen  5. Have you taken Nitroglycerin? no ?

## 2022-05-02 ENCOUNTER — Other Ambulatory Visit: Payer: Self-pay | Admitting: Internal Medicine

## 2022-05-13 ENCOUNTER — Telehealth: Payer: Self-pay | Admitting: Internal Medicine

## 2022-05-13 NOTE — Telephone Encounter (Signed)
Can hardly walk and knees is bruise. Unsure if that's heart related, wanted to speak to the nurse. On her right arm where her stint is there is ache from her wrist all the way up. Patient also states she is always cold. Please advice

## 2022-05-13 NOTE — Telephone Encounter (Signed)
Spoke with pt, she reports her right leg is hurting, she is unable to walk or lift it up by her report. She has a bruise and knot on the knee. Aware plavix does not make her bleed but she may bleed more if there is a reason. She also reports the right arm, the site used for the cath, hurts from the elbow to the wrist. She reports the wrist is aching like a tooth ache. Advised that the knee pain and bruising is not related to the procedure or heart. She has made an appointment with the orthopedic. Aware spoke to dr Martinique regarding the arm pain and he recommended putting warm compresses on the arm. If the discomfort does not improve, dr Martinique said the patient needed to be seen.  Patient voiced understanding

## 2022-05-21 NOTE — Telephone Encounter (Signed)
Please see other telephone encounter- patient is scheduled to see Dr. Margaretann Loveless 3/22

## 2022-05-24 ENCOUNTER — Other Ambulatory Visit: Payer: Self-pay | Admitting: Internal Medicine

## 2022-05-26 ENCOUNTER — Ambulatory Visit: Payer: 59 | Admitting: Physician Assistant

## 2022-05-27 ENCOUNTER — Ambulatory Visit: Payer: Self-pay

## 2022-05-27 ENCOUNTER — Encounter: Payer: Self-pay | Admitting: Physician Assistant

## 2022-05-27 ENCOUNTER — Ambulatory Visit (INDEPENDENT_AMBULATORY_CARE_PROVIDER_SITE_OTHER): Payer: 59

## 2022-05-27 ENCOUNTER — Telehealth: Payer: Self-pay | Admitting: Physician Assistant

## 2022-05-27 ENCOUNTER — Ambulatory Visit (INDEPENDENT_AMBULATORY_CARE_PROVIDER_SITE_OTHER): Payer: 59 | Admitting: Physician Assistant

## 2022-05-27 DIAGNOSIS — M1712 Unilateral primary osteoarthritis, left knee: Secondary | ICD-10-CM

## 2022-05-27 DIAGNOSIS — M25561 Pain in right knee: Secondary | ICD-10-CM

## 2022-05-27 DIAGNOSIS — M25562 Pain in left knee: Secondary | ICD-10-CM

## 2022-05-27 DIAGNOSIS — M1711 Unilateral primary osteoarthritis, right knee: Secondary | ICD-10-CM | POA: Diagnosis not present

## 2022-05-27 MED ORDER — METHYLPREDNISOLONE ACETATE 40 MG/ML IJ SUSP
40.0000 mg | INTRAMUSCULAR | Status: AC | PRN
  Administered 2022-05-27: 40 mg via INTRA_ARTICULAR

## 2022-05-27 MED ORDER — LIDOCAINE HCL 1 % IJ SOLN
3.0000 mL | INTRAMUSCULAR | Status: AC | PRN
  Administered 2022-05-27: 3 mL

## 2022-05-27 NOTE — Progress Notes (Signed)
Office Visit Note   Patient: Diane Barker           Date of Birth: [birth year]           MRN: [id number] Visit Date: 05/27/2022              Requested by: Shon Baton, [place],  [place] [address] PCP: Shon Baton, MD   Assessment & Plan: Visit Diagnoses:  1. Primary osteoarthritis of right knee   2. Primary osteoarthritis of left knee     Plan: Left knee was wrapped with Ace bandage she will remove this this evening before going to bed.  In regards to her knees she can have cortisone injections in the knees no more often than every 3 months.  She will follow-up with [place] as needed.  Questions were encouraged and answered at length.  Follow-Up Instructions: Return if symptoms worsen or fail to improve.   Orders:  Orders Placed This Encounter  Procedures   Large Joint Inj   XR Knee 1-2 Views Left   XR Knee 1-2 Views Right   No orders of the defined types were placed in this encounter.     Procedures: Large Joint Inj: bilateral knee on 05/27/2022 4:59 PM Indications: pain Details: 22 G 1.5 in needle, superolateral approach  Arthrogram: No  Medications (Right): 3 mL lidocaine 1 %; 40 mg methylPREDNISolone acetate 40 MG/ML Medications (Left): 3 mL lidocaine 1 %; 40 mg methylPREDNISolone acetate 40 MG/ML Aspirate (Left): 54 mL yellow Outcome: tolerated well, no immediate complications Procedure, treatment alternatives, risks and benefits explained, specific risks discussed. Consent was given by the patient. Immediately prior to procedure a time out was called to verify the correct patient, procedure, equipment, support staff and site/side marked as required. Patient was prepped and draped in the usual sterile fashion.       Clinical Data: No additional findings.   Subjective: Chief Complaint  Patient presents with   Left Knee - Pain   Right Knee - Pain    HPI Mrs. Sturkie comes in today for bilateral knee pain.  Left knee pain is worse than the  right.  She states that her left knee pain has been ongoing for a week right knee pains for the past 3 weeks.  She has tried Tylenol and over-the-counter creams and ointments without any real relief she states the left knee gives way on her at times.  Otherwise no mechanical symptoms of either knee.  She has trouble with steps.  No known injury to either knee.  She is nondiabetic. Review of Systems Negative for fevers chills.  Objective: Vital Signs: There were no vitals taken for this visit.  Physical Exam Constitutional:      Appearance: She is not ill-appearing or diaphoretic.  Pulmonary:     Effort: Pulmonary effort is normal.  Neurological:     Mental Status: She is alert and oriented to person, place, and time.  Psychiatric:        Mood and Affect: Mood normal.     Ortho Exam Bilateral knees: No abnormal warmth or erythema.  Left knee positive effusion.  No instability valgus varus stressing of either knee.  Patellofemoral crepitus bilaterally. Specialty Comments:  No specialty comments available.  Imaging: XR Knee 1-2 Views Left  Result Date: 05/27/2022 Left knee: Bone-on-bone medial compartment.  Severe patellofemoral arthritic changes and mild lateral compartmental changes with lateral osteophytes.  Knee is well located.  No acute fractures  XR Knee  1-2 Views Right  Result Date: 05/27/2022 Right knee 2 views: No acute fracture.  Severe patellofemoral arthritic changes.  Near bone-on-bone medial compartment.  Mild arthritic changes with osteophytes off the lateral compartment.  Knee is well located.    PMFS History: Patient Active Problem List   Diagnosis Date Noted   Hyperlipidemia 08/01/2020   AB (asthmatic bronchitis), mild persistent, with acute exacerbation    Unstable angina ([place]) 01/16/2018   Primary osteoarthritis of both knees 07/29/2017   Cellulitis of right arm 10/07/2016   Sepsis ([place]) 10/07/2016   Chest pain 10/07/2016   Dyspnea on exertion 05/24/2015    DVT, lower extremity ([place]) 05/09/2014   Right calf pain 06/21/2013   Pulmonary embolism ([place]) 06/21/2013   Dysphagia 06/27/2011   Cough 06/27/2011   CHEST PAIN 01/18/2009   RHINITIS 04/13/2007   Obstructive chronic bronchitis with acute bronchitis ([place]) 04/13/2007   GERD 04/13/2007   Depression 01/21/2007   OSTEOARTHRITIS 01/21/2007   LOW BACK PAIN 01/21/2007   BREAST CANCER, HX OF 01/21/2007   Past Medical History:  Diagnosis Date   Anxiety    Arthritis    "all over my body" (01/15/2018)   Breast cancer, right breast ([place]) 2000   Rt lumpectomy, Chemo/XRT/rt axillary node    Bronchial asthma    Chronic lower back pain    Depression    Diverticulosis    DVT (deep venous thrombosis) ([place]) 06/2013   RLE   GERD (gastroesophageal reflux disease)    High cholesterol    Hx of adenomatous colonic polyps    Hypertension    Obesity    Osteoarthritis    Personal history of chemotherapy    Personal history of radiation therapy    Pulmonary embolism ([place]) 06/2013   "both lungs"   Spondylolisthesis     Family History  Problem Relation Age of Onset   Asthma Mother    COPD Mother    Heart attack Mother    Diabetes Mother    Lung cancer Father    Asthma Daughter    Kidney failure Brother    Diabetes Brother        x 2   Heart attack Brother    Breast cancer Maternal Aunt    Colon cancer Paternal Aunt    Diabetes Daughter        x 2   Hypertension Brother     Past Surgical History:  Procedure Laterality Date   BREAST BIOPSY Right 2000   BREAST LUMPECTOMY Right 2000   for CA txed w/ chemo and radiation, right   CARDIAC CATHETERIZATION  03/21/2020   CORONARY ATHERECTOMY N/A 04/03/2022   Procedure: CORONARY ATHERECTOMY;  Surgeon: Martinique, Peter M, MD;  Location: [facility];  Service: Cardiovascular;  Laterality: N/A;   CORONARY STENT INTERVENTION N/A 04/03/2022   Procedure: CORONARY STENT INTERVENTION;  Surgeon: Martinique, Peter M, MD;  Location: [facility];   Service: Cardiovascular;  Laterality: N/A;   HERNIA REPAIR     INTRAVASCULAR IMAGING/OCT N/A 04/03/2022   Procedure: INTRAVASCULAR IMAGING/OCT;  Surgeon: Martinique, Peter M, MD;  Location: [facility];  Service: Cardiovascular;  Laterality: N/A;   INTRAVASCULAR PRESSURE WIRE/FFR STUDY N/A 01/18/2018   Procedure: INTRAVASCULAR PRESSURE WIRE/FFR STUDY;  Surgeon: Burnell Blanks, MD;  Location: [facility];  Service: Cardiovascular;  Laterality: N/A;   INTRAVASCULAR PRESSURE WIRE/FFR STUDY N/A 04/03/2022   Procedure: INTRAVASCULAR PRESSURE WIRE/FFR STUDY;  Surgeon: Martinique, Peter M, MD;  Location: [facility];  Service: Cardiovascular;  Laterality: N/A;   LEFT HEART CATH AND CORONARY ANGIOGRAPHY N/A 01/18/2018   Procedure: LEFT HEART CATH AND CORONARY ANGIOGRAPHY;  Surgeon: Burnell Blanks, MD;  Location: [facility];  Service: Cardiovascular;  Laterality: N/A;   LEFT HEART CATH AND CORONARY ANGIOGRAPHY N/A 03/21/2020   Procedure: LEFT HEART CATH AND CORONARY ANGIOGRAPHY;  Surgeon: Wellington Hampshire, MD;  Location: [facility];  Service: Cardiovascular;  Laterality: N/A;   LEFT HEART CATH AND CORONARY ANGIOGRAPHY N/A 04/03/2022   Procedure: LEFT HEART CATH AND CORONARY ANGIOGRAPHY;  Surgeon: Martinique, Peter M, MD;  Location: [facility];  Service: Cardiovascular;  Laterality: N/A;   TUBAL LIGATION     UMBILICAL HERNIA REPAIR  1983   Social History   Occupational History   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: [employer]  Tobacco Use   Smoking status: Never   Smokeless tobacco: Never  Vaping Use   Vaping Use: Never used  Substance and Sexual Activity   Alcohol use: Never    Alcohol/week: 0.0 standard drinks of alcohol   Drug use: Never   Sexual activity: Not Currently

## 2022-05-27 NOTE — Telephone Encounter (Signed)
Patient asking if someone could call her she said the injection helped but now she is starting to experience some discomfort

## 2022-05-27 NOTE — Telephone Encounter (Signed)
Called and advised pt. That this is normal. Pt was instructed to ice and elevate and give it a couple days. She stated understanding

## 2022-05-29 ENCOUNTER — Telehealth: Payer: Self-pay | Admitting: Internal Medicine

## 2022-05-29 MED ORDER — ATORVASTATIN CALCIUM 40 MG PO TABS: 40.0000 mg | ORAL_TABLET | Freq: Every day | ORAL | 2 refills | Status: DC

## 2022-05-29 NOTE — Telephone Encounter (Signed)
?*  STAT* If patient is at the pharmacy, call can be transferred to refill team. ? ? ?1. Which medications need to be refilled? (please list name of each medication and dose if known) atorvastatin (LIPITOR) 40 MG tablet ? ?2. Which pharmacy/location (including street and city if local pharmacy) is medication to be sent to? [id number] - [place], [place] - [address] ? ?3. Do they need a 30 day or 90 day supply? 90 day ? ?  ?

## 2022-05-29 NOTE — Telephone Encounter (Signed)
Refills has been sent to pharmacy. 

## 2022-06-02 ENCOUNTER — Telehealth: Payer: Self-pay | Admitting: Physician Assistant

## 2022-06-02 NOTE — Telephone Encounter (Signed)
Patient aware to give the injection a couple weeks and if no better to give [place] a call back and we can maybe schedule her for an MRI

## 2022-06-02 NOTE — Telephone Encounter (Signed)
Patient states her knee is still hurting and she just wants to speak to [place].

## 2022-06-06 ENCOUNTER — Ambulatory Visit: Payer: 59 | Attending: Internal Medicine | Admitting: Internal Medicine

## 2022-06-06 ENCOUNTER — Encounter: Payer: Self-pay | Admitting: Internal Medicine

## 2022-06-06 VITALS — BP 118/74 | HR 73 | Ht 65.5 in | Wt 229.8 lb

## 2022-06-06 DIAGNOSIS — Z79899 Other long term (current) drug therapy: Secondary | ICD-10-CM

## 2022-06-06 DIAGNOSIS — Z86711 Personal history of pulmonary embolism: Secondary | ICD-10-CM

## 2022-06-06 DIAGNOSIS — I10 Essential (primary) hypertension: Secondary | ICD-10-CM

## 2022-06-06 DIAGNOSIS — I209 Angina pectoris, unspecified: Secondary | ICD-10-CM

## 2022-06-06 DIAGNOSIS — R55 Syncope and collapse: Secondary | ICD-10-CM

## 2022-06-06 DIAGNOSIS — I25112 Atherosclerotic heart disease of native coronary artery with refractory angina pectoris: Secondary | ICD-10-CM | POA: Diagnosis not present

## 2022-06-06 DIAGNOSIS — Z86718 Personal history of other venous thrombosis and embolism: Secondary | ICD-10-CM

## 2022-06-06 DIAGNOSIS — Z955 Presence of coronary angioplasty implant and graft: Secondary | ICD-10-CM

## 2022-06-06 DIAGNOSIS — E785 Hyperlipidemia, unspecified: Secondary | ICD-10-CM

## 2022-06-06 MED ORDER — AMLODIPINE BESYLATE 2.5 MG PO TABS: 2.5000 mg | ORAL_TABLET | Freq: Every day | ORAL | 3 refills | Status: DC

## 2022-06-06 NOTE — Progress Notes (Signed)
Cardiology Office Note:    Date: 06/06/2022  ID:  Diane Barker, DOB 21-Dec-1950, MRN [id number]  PCP:  Shon Baton, MD  Cardiologist:  Elouise Munroe, MD  Electrophysiologist:  None   Referring MD: Shon Baton, MD   Chief Complaint/Reason for Referral: CAD  History of Present Illness:    Diane Barker is a 72 y.o. female with a history of nonobstructive coronary artery disease, GERD, HLD, HTN, obesity, and DVT and bilateral PE (2015) on chronic xarelto coming in today for chest pain.  Today:  On 04/03/22 she underwent a left heart cath with Dr. Peter Martinique. She had a successful PCI of the mid LAD with DES x1. She followed up with him on 04/25/22. She was having some cath site pain but this has resolved since. On cath, noted to have vasoreactivity due to abnormal CFR, no fixed microvascular disease. Recommended intensive vasodilator therapy, nitrates and CCB.  She states that her fatigue has improved since the stent was placed.  She states that she has had a few brief episodes of the previously mentioned chest/arm pain but attributes this to musculoskeletal causes with lifting/moving heavy objects at work. She feels that she is utilizing muscles she does not typically activate which triggers this pain. It is relieved with Tylenol.  She is currently on the Plavix and Xarelto. Will likely d/c Plavix in July at the recommendation of Dr. Martinique.   She reports having right knee pain since January and had some relief with a cortisone injection. She is continuing to follow with ortho for this.   Prior visits: She was hospitalized in early January 2022 for chest pain with a known proximal LAD lesion.  She was felt to have stable moderate one-vessel coronary artery disease with 60 to 70% stenosis of the proximal RCA.  Felt to have slight progression of LAD disease but not felt to be enough to cause unstable angina.  Focus was on medical management of CAD.    She did not tolerate Repatha  unfortunately, though may have had concomitant covid 19 infection with a positive test at that time. She does not feel that the infection caused her to feel unwell and feels it was related to repatha, she does not want to try [id number] again.  I have reviewed her prior PFTs which suggested exercise PFTS may be helpful and results of routine PFT showed: "Conclusions: The reduced lung volumes, increased FEV1/FVC ratio and diffusion defect suggest an interstitial process such as fibrosis or interstitial inflammation. Anemia cannot be excluded as a potential cause of the diffusion defect without correcting the observed diffusing capacity for hemoglobin. In view of the severity of the diffusion defect, studies with exercise would be helpful to evaluate the presence of hypoxemia."  DOE had previously been one of her anginal symptoms, in addition to chest pressure with left arm radiation. We discussed functional testing given recent cath in 2022. Cardiopulmonary exercise test on 10/14/21, CPET was mostly suggestive of deconditioning, mild chronotropic incompetence, and obesity related restrictive lung physiology. Marland Kitchen Her Echo 11/19/21 showed LVEF 55-60% and indeterminate diastolic parameters. We also discussed inclisiran, which she was going to consider.  On 03/13/2022 she called the office reporting intermittent chest pain which didn't occur every day, but could happen 1-2 times a day. Her chest pain resolved with one nitroglycerin, but afterwards developed soreness in the same area. She reports being under a lot of stress which has been known to exacerbate her chest pain in the past. Her  chest pain is usually localized above her left breast. It then radiates upwards to her left shoulder and neck, then progresses with distal radiation to the left arm. She has described this angina for me before, prompting her last cath. 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 She will note chest  pain afterward, however she can also get chest pain at rest, and has chest pain currently. Of note, she takes dexilant which is managing her acid reflux very well; her chest pain is distinct from her acid reflux. No known history of esophageal spasm.  At her last visit with me on 03/27/22, she had nitro responsive chest and back pain in the office. Referred for cath.   She also complained of fatigue/malaise and daytime somnolence. She felt as though she wanted to sleep in bed all day. She has a history of depression and I was concerned that this is related. She was not on BB as this worsened depressive symptoms.   Past Medical History:  Diagnosis Date   Anxiety    Arthritis    "all over my body" (01/15/2018)   Breast cancer, right breast ([place]) 2000   Rt lumpectomy, Chemo/XRT/rt axillary node    Bronchial asthma    Chronic lower back pain    Depression    Diverticulosis    DVT (deep venous thrombosis) ([place]) 06/2013   RLE   GERD (gastroesophageal reflux disease)    High cholesterol    Hx of adenomatous colonic polyps    Hypertension    Obesity    Osteoarthritis    Personal history of chemotherapy    Personal history of radiation therapy    Pulmonary embolism ([place]) 06/2013   "both lungs"   Spondylolisthesis     Past Surgical History:  Procedure Laterality Date   BREAST BIOPSY Right 2000   BREAST LUMPECTOMY Right 2000   for CA txed w/ chemo and radiation, right   CARDIAC CATHETERIZATION  03/21/2020   CORONARY ATHERECTOMY N/A 04/03/2022   Procedure: CORONARY ATHERECTOMY;  Surgeon: Martinique, Peter M, MD;  Location: [facility];  Service: Cardiovascular;  Laterality: N/A;   CORONARY STENT INTERVENTION N/A 04/03/2022   Procedure: CORONARY STENT INTERVENTION;  Surgeon: Martinique, Peter M, MD;  Location: [facility];  Service: Cardiovascular;  Laterality: N/A;   HERNIA REPAIR     INTRAVASCULAR IMAGING/OCT N/A 04/03/2022   Procedure: INTRAVASCULAR IMAGING/OCT;  Surgeon: Martinique,  Peter M, MD;  Location: [facility];  Service: Cardiovascular;  Laterality: N/A;   INTRAVASCULAR PRESSURE WIRE/FFR STUDY N/A 01/18/2018   Procedure: INTRAVASCULAR PRESSURE WIRE/FFR STUDY;  Surgeon: Burnell Blanks, MD;  Location: [facility];  Service: Cardiovascular;  Laterality: N/A;   INTRAVASCULAR PRESSURE WIRE/FFR STUDY N/A 04/03/2022   Procedure: INTRAVASCULAR PRESSURE WIRE/FFR STUDY;  Surgeon: Martinique, Peter M, MD;  Location: [facility];  Service: Cardiovascular;  Laterality: N/A;   LEFT HEART CATH AND CORONARY ANGIOGRAPHY N/A 01/18/2018   Procedure: LEFT HEART CATH AND CORONARY ANGIOGRAPHY;  Surgeon: Burnell Blanks, MD;  Location: [facility];  Service: Cardiovascular;  Laterality: N/A;   LEFT HEART CATH AND CORONARY ANGIOGRAPHY N/A 03/21/2020   Procedure: LEFT HEART CATH AND CORONARY ANGIOGRAPHY;  Surgeon: Wellington Hampshire, MD;  Location: [facility];  Service: Cardiovascular;  Laterality: N/A;   LEFT HEART CATH AND CORONARY ANGIOGRAPHY N/A 04/03/2022   Procedure: LEFT HEART CATH AND CORONARY ANGIOGRAPHY;  Surgeon:  Martinique, Peter M, MD;  Location: [facility];  Service: Cardiovascular;  Laterality: N/A;   TUBAL LIGATION     UMBILICAL HERNIA REPAIR  1983    Current Medications:  Current Outpatient Medications:    acetaminophen (TYLENOL) 500 MG tablet, Take 500-1,000 mg by mouth every 6 (six) hours as needed for mild pain., Disp: , Rfl:    amoxicillin (AMOXIL) 500 MG capsule, Take 1,000 mg by mouth 2 (two) times daily., Disp: , Rfl:    atorvastatin (LIPITOR) 40 MG tablet, Take 1 tablet (40 mg total) by mouth daily., Disp: 90 tablet, Rfl: 2   clopidogrel (PLAVIX) 75 MG tablet, Take 1 tablet (75 mg total) by mouth daily with breakfast., Disp: 90 tablet, Rfl: 1   dexlansoprazole (DEXILANT) 60 MG capsule, Take 60 mg by mouth in the morning., Disp: , Rfl:    escitalopram (LEXAPRO) 10 MG tablet, Take 10 mg by mouth in the morning., Disp: ,  Rfl:    isosorbide mononitrate (IMDUR) 60 MG 24 hr tablet, Take 1.5 tablets (90 mg total) by mouth daily., Disp: 45 tablet, Rfl: 3   nitroGLYCERIN (NITROSTAT) 0.4 MG SL tablet, Place 1 tablet (0.4 mg total) under the tongue every 5 (five) minutes as needed., Disp: 25 tablet, Rfl: 3   PROAIR HFA 108 (90 Base) MCG/ACT inhaler, Inhale 2 puffs into the lungs every 4 (four) hours as needed for wheezing or shortness of breath., Disp: 54 g, Rfl: 4   rivaroxaban (XARELTO) 20 MG TABS tablet, Take 20 mg by mouth in the morning., Disp: , Rfl:    Spacer/Aero-Holding Chambers (AEROCHAMBER MV) inhaler, Use as instructed, Disp: 1 each, Rfl: 0   amLODipine (NORVASC) 2.5 MG tablet, Take 1 tablet (2.5 mg total) by mouth daily., Disp: 90 tablet, Rfl: 3   Allergies:   E.e.s. [erythromycin], Lipitor [atorvastatin], Morphine and related, Neurontin [gabapentin], Omnicef [cefdinir], Penicillins, and Relafen [nabumetone]   Social History   Tobacco Use   Smoking status: Never   Smokeless tobacco: Never  Vaping Use   Vaping Use: Never used  Substance Use Topics   Alcohol use: Never    Alcohol/week: 0.0 standard drinks of alcohol   Drug use: Never     Family History: The patient's family history includes Asthma in her daughter and mother; Breast cancer in her maternal aunt; COPD in her mother; Colon cancer in her paternal aunt; Diabetes in her brother, daughter, and mother; Heart attack in her brother and mother; Hypertension in her brother; Kidney failure in her brother; Lung cancer in her father.  ROS:   Please see the history of present illness.    + chest pain + right knee pain All other systems reviewed and are negative.  EKGs/Labs/Other Studies Reviewed:    The following studies were reviewed today:  Left heart cath 03/24/22:   Ost LAD to Prox LAD lesion is 10% stenosed.   Prox Cx to Mid Cx lesion is 30% stenosed.   Ost RCA to Prox RCA lesion is 20% stenosed.   Mid RCA lesion is 20% stenosed.   Ost  1st Diag lesion is 30% stenosed.   Ost RPDA to RPDA lesion is 20% stenosed.   Ost 2nd Diag lesion is 30% stenosed.   Mid LAD lesion is 60% stenosed.   Prox LAD lesion is 65% stenosed.   A drug-eluting stent was successfully placed using a STENT ONYX FRONTIER 3.0X34.   Post intervention, there is a 0% residual stenosis.   Post intervention, there is  a 0% residual stenosis.   The left ventricular systolic function is normal.   LV end diastolic pressure is mildly elevated.   The left ventricular ejection fraction is 55-65% by visual estimate. Single vessel obstructive CAD involving the mid LAD which is heavily calcified. Flow analysis suggest no fixed microvascular disease but abnormal CFR suggesting significant vasoreactivity abnormality.  So she has both epicardial disease and abnormal vasoreactivity.  Normal LV function Mildly elevated LVEDP Successful PCI of the mid LAD with OCT guidance and DES x 1  Echo 11/19/2021: Sonographer Comments: Technically difficult study due to poor echo windows and suboptimal apical window. Image acquisition challenging due to patient body habitus.  IMPRESSIONS   1. Left ventricular ejection fraction, by estimation, is 55 to 60%. The  left ventricle has normal function. The left ventricle has no regional  wall motion abnormalities. Left ventricular diastolic parameters are  indeterminate.   2. Right ventricular systolic function is normal. The right ventricular  size is normal. Tricuspid regurgitation signal is inadequate for assessing  PA pressure.   3. Left atrial size was moderately dilated.   4. The mitral valve is degenerative. Mild mitral valve regurgitation. No  evidence of mitral stenosis.   5. The aortic valve is tricuspid. Aortic valve regurgitation is trivial.  No aortic stenosis is present.  Comparison(s): No significant change from prior study. 01/16/18 EF 55-60%.   Cardiopulmonary Exercise Test 10/14/2021: Conclusion: Exercise testing with  gas exchange demonstrates normal functional capacity when compared to matched sedentary norms. There is no indication for cardiopulmonary abnormality. Patient appears primarily ventilatory limited due to body habitus.   Bilateral LE Venous Doppler 01/28/2021: Summary:  BILATERAL:  - No evidence of deep vein thrombosis seen in the lower extremities,  bilaterally.  - No evidence of superficial venous thrombosis in the lower extremities,  bilaterally.  -No evidence of popliteal cyst, bilaterally.  RIGHT:  - Findings consistent with chronic superficial vein thrombosis involving  the right small saphenous vein.  CTA 03/21/20 Ost 1st Diag lesion is 30% stenosed. Ost LAD to Prox LAD lesion is 10% stenosed. Ost 2nd Diag lesion is 50% stenosed. Mid LAD lesion is 40% stenosed. Prox LAD lesion is 65% stenosed. Prox Cx to Mid Cx lesion is 30% stenosed. Ost RCA to Prox RCA lesion is 20% stenosed. Ost RPDA to RPDA lesion is 20% stenosed. Mid RCA lesion is 20% stenosed. The left ventricular systolic function is normal. LV end diastolic pressure is normal. The left ventricular ejection fraction is 55-65% by visual estimate. 1.  Stable moderate one-vessel coronary artery disease with 60 to 70% stenosis in the proximal right coronary artery which is eccentric and calcified at the origin of the first diagonal.  The mid lesion seems to be better than before at 40%.  No evidence of obstructive disease. 2.  Normal LV systolic function normal left ventricular end-diastolic pressure.  Monitor 11/16/19 IMPRESSION: infrequent ectopy, brief episode of asymptomatic SVT. Symptoms associated with SR and sinus tachycardia.   Lower Venous DVT 06/20/19 RIGHT:  - No evidence of common femoral vein obstruction.  LEFT:  - There is no evidence of deep vein thrombosis in the lower extremity.  - A cystic structure is found in the popliteal fossa extending into the  proximal medial calf, suggestive of possible ruptured  Baker's cyst.   CTA 01/18/18 Ost RCA to Prox RCA lesion is 20% stenosed. Ost RPDA to RPDA lesion is 20% stenosed. Mid RCA lesion is 20% stenosed. Prox Cx to Mid Cx  lesion is 30% stenosed. Prox LAD lesion is 60% stenosed. Ost LAD to Prox LAD lesion is 10% stenosed. Ost 1st Diag lesion is 30% stenosed. Mid LAD lesion is 50% stenosed. Ost 2nd Diag lesion is 50% stenosed. 1. Moderate, eccentric, calcified stenosis in the mid LAD at the takeoff of the first diagonal branch and moderate eccentric stenosis in the mid to distal LAD at the takeoff of the second diagonal branch. DFR and FFR analysis of the mid and distal LAD suggests the lesions are not flow limiting. In multiple angiographic views, the lesions appear to be moderate only.  2. Mild non-obstructive disease in the RCA and Circumflex  Echo 01/16/18 - Procedure narrative: Transthoracic echocardiography. Image    quality was poor. The study was technically difficult, as a    result of body habitus. Intravenous contrast (Definity) was    administered.  - Left ventricle: The cavity size was normal. Wall thickness was    increased in a pattern of moderate LVH. Systolic function was    normal. The estimated ejection fraction was in the range of 55%    to 60%. Wall motion was normal; there were no regional wall    motion abnormalities. Doppler parameters are consistent with    abnormal left ventricular relaxation (grade 1 diastolic    dysfunction). The E/e&' ratio is >15, suggesting elevated LV    filling pressure.  - Mitral valve: Calcified annulus. Mildly thickened leaflets .    There was trivial regurgitation.  - Left atrium: The atrium was normal in size.  - Inferior vena cava: The vessel was normal in size. The    respirophasic diameter changes were in the normal range (>= 50%),    consistent with normal central venous pressure.  Impressions:  - Technically difficult study. Definity contrast given LVEF 55-60%,    moderate LVH, no  definite regional wall motion abnormalities,    grade 1 DD, elevated LV filling pressure, trivial MR, normal LA    size, normal IVC.   I have independently reviewed the images from cardiac catheterization performed 03/21/2020.   EKG: EKG is personally reviewed. 06/06/22: Not ordered today. 03/27/2022:  Sinus rhythm. LVH.  10/10/21: NSR irbbb, lvh 01/15/21: NSR, rate 65 bpm 07/09/2020: NSR, incomplete RBBB, non-specific t waves abnormality in the lateral leads, Rate: 72 bpm 12/28/2019: NSR, possible LVH   Recent Labs: 03/28/2022: BUN 16; Creatinine, Ser 0.94; Hemoglobin 12.8; Platelets 223; Potassium 4.1; Sodium 142   Recent Lipid Panel    Component Value Date/Time   CHOL 130 03/22/2020 0042   TRIG 58 03/22/2020 0042   HDL 46 03/22/2020 0042   CHOLHDL 2.8 03/22/2020 0042   VLDL 12 03/22/2020 0042   LDLCALC 72 03/22/2020 0042    Physical Exam:    VS:  BP 118/74   Pulse 73   Ht 5' 5.5" (1.664 m)   Wt 229 lb 12.8 oz (104.2 kg)   SpO2 96%   BMI 37.66 kg/m     Wt Readings from Last 5 Encounters:  06/06/22 229 lb 12.8 oz (104.2 kg)  04/25/22 235 lb 12.8 oz (107 kg)  04/03/22 240 lb (108.9 kg)  03/28/22 241 lb (109.3 kg)  03/27/22 239 lb 12.8 oz (108.8 kg)    Constitutional: No acute distress Eyes: sclera non-icteric, normal conjunctiva and lids ENMT: normal dentition, moist mucous membranes Cardiovascular: regular rhythm, normal rate. 1/6 systolic murmur. S1 and S2 normal.  No jugular venous distention.  Respiratory: clear to auscultation bilaterally GI : normal  bowel sounds, soft and nontender. No distention.   MSK: extremities warm, well perfused. Excoriations medial left leg. Venous stasis changes. NEURO: grossly nonfocal exam, moves all extremities. PSYCH: alert and oriented x 3, normal mood and affect.   ASSESSMENT:    1. Coronary artery disease involving native coronary artery of native heart with refractory angina pectoris ([place])   2. Essential hypertension   3.  History of DVT (deep vein thrombosis)   4. Medication management   5. Hyperlipidemia, unspecified hyperlipidemia type   6. Angina pectoris ([place])   7. S/P coronary artery stent placement   8. History of pulmonary embolus (PE)   9. Syncope and collapse     PLAN:    Chest pain CAD Med management -Does not tolerate beta-blocker due to worsening depression -Did not tolerate Repatha for unclear reasons, she may be amenable to inclisiran. - Imdur 90 mg daily with good control of angina symptoms. - Had DES to mid LAD on 04/03/22 with Dr. Peter Martinique.  -noted vasoreactivity on cath, which we were strongly suspicious for. Continue imdur and amlodipine. Refill amlodipine today.  - Currently on Plavix and Xarelto. Will likely d/c Plavix in July 2024 at the recommendation of Dr. Martinique. Indefinite Xarelto.  HTN - Blood pressures well controlled. - Continue Norvasc 2.5mg  daily- refill provided today  HLD - lipids from 03/22/20 stable, LDL of 72. - Continue Lipitor 40mg  daily. Consider inclisiran.   DOE - CPET showed obesity related restriction of lung physiology, deconditioning and mild chronotropic incompetance - repeat echo unchanged.  - recommend sleep study for DOE and fatigue.   History of DVT and PE - continue Xarelto. No bleeding events. - Again recommend OTC compression socks.  Syncope - no interval syncope.  Obesity - complicating symptoms above. Recommend referral to PREP. She is motivated to exercise this summer with her granddaughter.    Total time of encounter: 30 minutes total time of encounter, including 20 minutes spent in face-to-face patient care on the date of this encounter. This time includes coordination of care and counseling regarding above mentioned problem list. Remainder of non-face-to-face time involved reviewing chart documents/testing relevant to the patient encounter and documentation in the medical record. I have independently reviewed documentation from  referring provider.   Cherlynn Kaiser, MD, [facility]   Medication Adjustments/Labs and Tests Ordered: Current medicines are reviewed at length with the patient today.  Concerns regarding medicines are outlined above.   No orders of the defined types were placed in this encounter.  Meds ordered this encounter  Medications   amLODipine (NORVASC) 2.5 MG tablet    Sig: Take 1 tablet (2.5 mg total) by mouth daily.    Dispense:  90 tablet    Refill:  3   Patient Instructions  Medication Instructions:  Your physician recommends that you continue on your current medications as directed. Please refer to the Current Medication list given to you today.  *If you need a refill on your cardiac medications before your next appointment, please call your pharmacy*  Follow-Up: At [facility], you and your health needs are our priority.  As part of our continuing mission to provide you with exceptional heart care, we have created designated Provider Care Teams.  These Care Teams include your primary Cardiologist (physician) and Advanced Practice Providers (APPs -  Physician Assistants and Nurse Practitioners) who all work together to provide you with the care you need, when you need it.  We recommend signing  up for the patient portal called "MyChart".  Sign up information is provided on this After Visit Summary.  MyChart is used to connect with patients for Virtual Visits (Telemedicine).  Patients are able to view lab/test results, encounter notes, upcoming appointments, etc.  Non-urgent messages can be sent to your provider as well.   To learn more about what you can do with MyChart, go to [email or web address].    Your next appointment:   6 month(s)  Provider:   Elouise Munroe, MD         I,Alexis Herring,acting as a scribe for Elouise Munroe, MD.,have documented all relevant documentation on the behalf of Elouise Munroe, MD,as directed by  Elouise Munroe, MD while in the presence of Elouise Munroe, MD.  I, Elouise Munroe, MD, have reviewed all documentation for the visit on 06/06/2022. The documentation on today's date of service for the exam, diagnosis, procedures, and orders are all accurate and complete.

## 2022-06-06 NOTE — Patient Instructions (Signed)
Medication Instructions:  Your physician recommends that you continue on your current medications as directed. Please refer to the Current Medication list given to you today.  *If you need a refill on your cardiac medications before your next appointment, please call your pharmacy*  Follow-Up: At [facility], you and your health needs are our priority.  As part of our continuing mission to provide you with exceptional heart care, we have created designated Provider Care Teams.  These Care Teams include your primary Cardiologist (physician) and Advanced Practice Providers (APPs -  Physician Assistants and Nurse Practitioners) who all work together to provide you with the care you need, when you need it.  We recommend signing up for the patient portal called "MyChart".  Sign up information is provided on this After Visit Summary.  MyChart is used to connect with patients for Virtual Visits (Telemedicine).  Patients are able to view lab/test results, encounter notes, upcoming appointments, etc.  Non-urgent messages can be sent to your provider as well.   To learn more about what you can do with MyChart, go to [email or web address].    Your next appointment:   6 month(s)  Provider:   Elouise Munroe, MD

## 2022-06-19 ENCOUNTER — Telehealth: Payer: Self-pay | Admitting: Physician Assistant

## 2022-06-19 NOTE — Telephone Encounter (Signed)
Ok to order 

## 2022-06-19 NOTE — Telephone Encounter (Signed)
I advised pt. She stated she wants to think about it. She will call us back.

## 2022-06-19 NOTE — Telephone Encounter (Signed)
Patient called advised her knees are not any better. Patient asked if she can get set up for an MRI? The number to contact patient is [phone]

## 2022-07-03 ENCOUNTER — Ambulatory Visit (INDEPENDENT_AMBULATORY_CARE_PROVIDER_SITE_OTHER): Payer: 59 | Admitting: Physician Assistant

## 2022-07-03 ENCOUNTER — Encounter: Payer: Self-pay | Admitting: Physician Assistant

## 2022-07-03 ENCOUNTER — Telehealth: Payer: Self-pay | Admitting: Internal Medicine

## 2022-07-03 DIAGNOSIS — M7052 Other bursitis of knee, left knee: Secondary | ICD-10-CM | POA: Diagnosis not present

## 2022-07-03 MED ORDER — LIDOCAINE HCL 1 % IJ SOLN
1.0000 mL | INTRAMUSCULAR | Status: AC | PRN
  Administered 2022-07-03: 1 mL

## 2022-07-03 MED ORDER — CLOPIDOGREL BISULFATE 75 MG PO TABS: 75.0000 mg | ORAL_TABLET | Freq: Every day | ORAL | 1 refills | Status: DC

## 2022-07-03 MED ORDER — METHYLPREDNISOLONE ACETATE 40 MG/ML IJ SUSP
40.0000 mg | INTRAMUSCULAR | Status: AC | PRN
  Administered 2022-07-03: 40 mg via INTRAMUSCULAR

## 2022-07-03 NOTE — Telephone Encounter (Signed)
*  STAT* If patient is at the pharmacy, call can be transferred to refill team.   1. Which medications need to be refilled? (please list name of each medication and dose if known) clopidogrel (PLAVIX) 75 MG tablet   2. Which pharmacy/location (including street and city if local pharmacy) is medication to be sent to? [id number] - [place], [place] - [address]   3. Do they need a 30 day or 90 day supply? 90

## 2022-07-03 NOTE — Progress Notes (Signed)
   Procedure Note  Patient: Diane Barker             Date of Birth: April 17, 1950           MRN: [id number]             Visit Date: 07/03/2022 HPI: This Toenjes comes in today due to left knee pain.  She was last seen on 05/27/2022 was found to have tricompartmental arthritis both knees.  She was given injections both knees did well.  However she dropped a package of chicken breast that weighed approximately 20 pounds on her left foot since she has had pain not in the foot within the left knee.  She is having difficulty ambulating due to the pain in the knee.  She notes locking, catching giving way left knee.  Right knee overall is doing well.  Review of systems: See HPI otherwise negative  Physical exam: Left foot ecchymosis over the great toe.  She has no pain with palpation of the great toe of the metatarsals 3-5 of the foot.  There is no rashes skin lesions ulcerations otherwise.  Dorsal pedal pulses 2+.  Good range of motion left ankle without pain.  Left knee: Good range of motion.  Patellofemoral crepitus.  Nontender over the medial and lateral joint line.  Tenderness over the left pes anserinus region. Procedures: Visit Diagnoses:  1. Pes anserinus bursitis of left knee     Trigger Point Inj  Date/Time: 07/03/2022 3:09 PM  Performed by: Kirtland Bouchard, PA-C Authorized by: Kirtland Bouchard, PA-C   Indications:  Pain and therapeutic Total # of Trigger Points:  1 Location: lower extremity   Approach:  Medial Medications #1:  1 mL lidocaine 1 %; 40 mg methylPREDNISolone acetate 40 MG/ML   Plan: Will see him back on an as-needed basis.  Post injection she reports that she is able to ambulate without significant discomfort.  She is able to move the knee back-and-forth without any discomfort postinjection also.

## 2022-07-14 ENCOUNTER — Ambulatory Visit: Payer: 59 | Admitting: Physician Assistant

## 2022-07-15 ENCOUNTER — Telehealth (HOSPITAL_COMMUNITY): Payer: Self-pay

## 2022-07-15 NOTE — Telephone Encounter (Signed)
Attempted to call patient in regards to Cardiac Rehab - LM on VM Mailed letter 

## 2022-07-15 NOTE — Telephone Encounter (Signed)
Pt returned CR phone call and stated she is not interested at this time.   Closed referral 

## 2022-07-21 ENCOUNTER — Ambulatory Visit (HOSPITAL_COMMUNITY)
Admission: RE | Admit: 2022-07-21 | Discharge: 2022-07-21 | Disposition: A | Discharge status: Home / Self Care | Payer: 59 | Source: Ambulatory Visit | Attending: Family Medicine | Admitting: Family Medicine

## 2022-07-21 ENCOUNTER — Encounter (HOSPITAL_COMMUNITY): Payer: Self-pay

## 2022-07-21 VITALS — BP 143/68 | HR 71 | Temp 98.0°F | Resp 20

## 2022-07-21 DIAGNOSIS — R051 Acute cough: Secondary | ICD-10-CM

## 2022-07-21 DIAGNOSIS — H6123 Impacted cerumen, bilateral: Secondary | ICD-10-CM | POA: Diagnosis not present

## 2022-07-21 MED ORDER — HYDROCODONE BIT-HOMATROP MBR 5-1.5 MG/5ML PO SOLN: 5.0000 mL | Freq: Every evening | ORAL | 0 refills | Status: DC | PRN

## 2022-07-21 NOTE — ED Notes (Signed)
PA at bedside trying to clean pts ears

## 2022-07-21 NOTE — Discharge Instructions (Addendum)
Be aware, your cough medication may cause drowsiness. Please do not drive, operate heavy machinery or make important decisions while on this medication, it can cloud your judgement.  

## 2022-07-21 NOTE — ED Triage Notes (Signed)
Pt c/o bilateral ear fullness x1wk and now having pain to rt ear. States had a cough since yesterday and sore throat today. States tylenol with little relief.

## 2022-07-23 NOTE — ED Provider Notes (Signed)
[facility]   [id number] 07/21/22 Arrival Time: 1341  ASSESSMENT & PLAN:  1. Bilateral impacted cerumen   2. Acute cough    May return as needed. May try OTC Debrox. OTC symptom care as needed.  Discharge Medication List as of 07/21/2022  2:20 PM     START taking these medications   Details  HYDROcodone bit-homatropine (HYCODAN) 5-1.5 MG/5ML syrup Take 5 mLs by mouth at bedtime as needed for cough., Starting Mon 07/21/2022, Normal         Follow-up Information     Creola Corn, MD.   Specialty: Internal Medicine Why: If worsening or failing to improve as anticipated. Contact information: [address] [place] [address] [phone]                 Reviewed expectations re: course of current medical issues. Questions answered. Outlined signs and symptoms indicating need for more acute intervention. Understanding verbalized. After Visit Summary given.   SUBJECTIVE: History from: Patient. Diane Barker is a 72 y.o. female. Pt c/o bilateral ear fullness x1wk and now having pain to rt ear. States had a cough since yesterday and sore throat today. States tylenol with little relief.  Denies: fever. Normal PO intake without n/v/d.  OBJECTIVE:  Vitals:   07/21/22 1409  BP: (!) 143/68  Pulse: 71  Resp: 20  Temp: 98 F (36.7 C)  TempSrc: Oral  SpO2: 97%    General appearance: alert; no distress Eyes: PERRLA; EOMI; conjunctivae normal HENT: [place]; AT; with mild nasal congestion; bilateral cerumen impaction Neck: supple  Lungs: speaks full sentences without difficulty; unlabored Extremities: no edema Skin: warm and dry Neurologic: normal gait Psychological: alert and cooperative; normal mood and affect   Allergies  Allergen Reactions   E.E.S. [Erythromycin] Other (See Comments)    (Higher doses) stomach cramps   Lipitor [Atorvastatin]     High doses cause fatigued myalgias   Morphine And Related Other (See Comments)    hallucinations    Neurontin [Gabapentin] Other (See Comments)    Causes blackouts   Omnicef [Cefdinir] Itching   Penicillins Other (See Comments)    Unknown; childhood reaction.   Relafen [Nabumetone] Nausea Only    Past Medical History:  Diagnosis Date   Anxiety    Arthritis    "all over my body" (01/15/2018)   Breast cancer, right breast (HCC) 2000   Rt lumpectomy, Chemo/XRT/rt axillary node    Bronchial asthma    Chronic lower back pain    Depression    Diverticulosis    DVT (deep venous thrombosis) (HCC) 06/2013   RLE   GERD (gastroesophageal reflux disease)    High cholesterol    Hx of adenomatous colonic polyps    Hypertension    Obesity    Osteoarthritis    Personal history of chemotherapy    Personal history of radiation therapy    Pulmonary embolism (HCC) 06/2013   "both lungs"   Spondylolisthesis    Social History   Socioeconomic History   Marital status: Single    Spouse name: Not on file   Number of children: 2   Years of education: 10   Highest education level: Not on file  Occupational History   Occupation: Personnel officer    Comment: retired    Associate Professor: [employer]  Tobacco Use   Smoking status: Never   Smokeless tobacco: Never  Vaping Use   Vaping Use: Never used  Substance and Sexual Activity   Alcohol  use: Never    Alcohol/week: 0.0 standard drinks of alcohol   Drug use: Never   Sexual activity: Not Currently  Other Topics Concern   Not on file  Social History Narrative   ** Merged History Encounter **       Lives at home alone Caffeine use- soda 16-24 oz weekly   Social Determinants of Health   Financial Resource Strain: Not on file  Food Insecurity: Not on file  Transportation Needs: Not on file  Physical Activity: Not on file  Stress: Not on file  Social Connections: Not on file  Intimate Partner Violence: Not on file   Family History  Problem Relation Age of Onset   Asthma Mother    COPD Mother    Heart attack Mother    Diabetes  Mother    Lung cancer Father    Asthma Daughter    Kidney failure Brother    Diabetes Brother        x 2   Heart attack Brother    Breast cancer Maternal Aunt    Colon cancer Paternal Aunt    Diabetes Daughter        x 2   Hypertension Brother    Past Surgical History:  Procedure Laterality Date   BREAST BIOPSY Right 2000   BREAST LUMPECTOMY Right 2000   for CA txed w/ chemo and radiation, right   CARDIAC CATHETERIZATION  03/21/2020   CORONARY ATHERECTOMY N/A 04/03/2022   Procedure: CORONARY ATHERECTOMY;  Surgeon: Swaziland, Peter M, MD;  [facility]: [facility];  Service: Cardiovascular;  Laterality: N/A;   CORONARY IMAGING/OCT N/A 04/03/2022   Procedure: INTRAVASCULAR IMAGING/OCT;  Surgeon: Swaziland, Peter M, MD;  Location: [facility] INVAS[facility] CV LAB;  Service: Cardiovascular;  Laterality: N/A;   CORONARY PRESSURE/FFR STUDY N/A 01/18/2018   Procedure: INTRAVASCULAR PRESSURE WIRE/FFR STUDY;  Surgeon: Kathleene Hazel, MD;  Location: [facility];  Service: Cardiovascular;  Laterality: N/A;   CORONARY PRESSURE/FFR STUDY N/A 04/03/2022   Procedure: INTRAVASCULAR PRESSURE WIRE/FFR STUDY;  Surgeon: Swaziland, Peter M, MD;  Location: [facility] INVASIVE CV LAB;  Service: Cardiovascular;  Laterality: N/A;   CORONARY STENT INTERVENTION N/A 04/03/2022   Procedure: CORONARY STENT INTERVENTION;  Surgeon: Swaziland, Peter M, MD;  Location: [facility] INVASIVE CV LAB;  Service: Cardiovascular;  Laterality: N/A;   HERNIA REPAIR     LEFT HEART CATH AND CORONARY ANGIOGRAPHY N/A 01/18/2018   Procedure: LEFT HEART CATH AND CORONARY ANGIOGRAPHY;  Surgeon: Kathleene Hazel, MD;  Location: [facility];  Service: Cardiovascular;  Laterality: N/A;   LEFT HEART CATH AND CORONARY ANGIOGRAPHY N/A 03/21/2020   Procedure: LEFT HEART CATH AND CORONARY ANGIOGRAPHY;  Surgeon: Iran Ouch, MD;  Location: [facility];  Service: Cardiovascular;  Laterality: N/A;   LEFT HEART CATH AND CORONARY ANGIOGRAPHY N/A  04/03/2022   Procedure: LEFT HEART CATH AND CORONARY ANGIOGRAPHY;  Surgeon: Swaziland, Peter M, MD;  Location: [facility] INVASIVE CV LAB;  Service: Cardiovascular;  Laterality: N/A;   TUBAL LIGATION     UMBILICAL HERNIA REPAIR  1983     Mardella Layman, MD 07/23/22 1043

## 2022-07-24 ENCOUNTER — Encounter: Payer: Self-pay | Admitting: Internal Medicine

## 2022-07-24 ENCOUNTER — Ambulatory Visit (INDEPENDENT_AMBULATORY_CARE_PROVIDER_SITE_OTHER): Payer: 59 | Admitting: Internal Medicine

## 2022-07-24 ENCOUNTER — Telehealth: Payer: Self-pay | Admitting: Internal Medicine

## 2022-07-24 VITALS — BP 122/78 | HR 86 | Temp 98.0°F | Ht 65.5 in | Wt 233.2 lb

## 2022-07-24 DIAGNOSIS — I2782 Chronic pulmonary embolism: Secondary | ICD-10-CM | POA: Diagnosis not present

## 2022-07-24 DIAGNOSIS — J209 Acute bronchitis, unspecified: Secondary | ICD-10-CM | POA: Diagnosis not present

## 2022-07-24 DIAGNOSIS — I2609 Other pulmonary embolism with acute cor pulmonale: Secondary | ICD-10-CM

## 2022-07-24 DIAGNOSIS — J440 Chronic obstructive pulmonary disease with acute lower respiratory infection: Secondary | ICD-10-CM | POA: Diagnosis not present

## 2022-07-24 DIAGNOSIS — J4531 Mild persistent asthma with (acute) exacerbation: Secondary | ICD-10-CM

## 2022-07-24 MED ORDER — DOXYCYCLINE HYCLATE 100 MG PO TABS: 100.0000 mg | ORAL_TABLET | Freq: Two times a day (BID) | ORAL | 0 refills | Status: DC

## 2022-07-24 MED ORDER — METHYLPREDNISOLONE ACETATE 80 MG/ML IJ SUSP
80.0000 mg | Freq: Once | INTRAMUSCULAR | Status: AC
  Administered 2022-07-24: 80 mg via INTRAMUSCULAR

## 2022-07-24 MED ORDER — BENZONATATE 200 MG PO CAPS: 200.0000 mg | ORAL_CAPSULE | Freq: Three times a day (TID) | ORAL | 1 refills | Status: DC | PRN

## 2022-07-24 NOTE — Progress Notes (Signed)
Patient ID: Diane Barker, female    DOB: 08-Jul-1950, 72 y.o.   MRN: [id number]  HPI F never smoker followed for bronchitis with hx rhinitis, GERD, hx breast cancer, Hx DVT/ PE/ Xarelto. Office Spirometry 04/30/2015- WNL- FVC 2.75/87%, FEV1 2.29/93%, FEV1/FVC 0.83, FEF 25-75 percent 2.83/126%. PFT 01/08/2018- insignificant response to dilator, mild restriction, diffusion mildly reduced.  FVC 2.52/78%, FEV1 2.21/90%, ratio 1.88, FEF 25-75% 3.30/259%, TLC 75%, DLCO 63% ------------------------------------------------------------------------------   01/27/22- 72 year old female never smoker followed for Bronchitis, Rhinitis, complicated by GERD, history breast cancer/XRT, history DVT/PE/Xarelto, Covid infection November 2020, CAD,  -Ventolin hfa, Neb Duoneb,  Covid vax-none Flu vax-had ED in Oct for back pain after fall CPET 10/14/21- Conclusion: Exercise testing with gas exchange demonstrates normal functional capacity when compared to matched sedentary norms. There is no indication for cardiopulmonary abnormality. Patient appears primarily ventilatory limited due to body habitus.  ECHO 11/19/21- WNL -----Pt states she is doing fine  She says Markus Daft was "too strong for me".  She has occasional dry cough which is stable, and little wheeze.  She is comfortable using her albuterol rescue inhaler a few times a week.  Still has her nebulizer machine but has not needed it in a long time.  Does not seem to need a maintenance inhaler.  07/24/22--72 year old female never smoker followed for Bronchitis, Rhinitis, complicated by GERD, history breast cancer/XRT, history DVT/PE/Xarelto, Covid infection November 2020, CAD,  -Ventolin hfa, Neb Duoneb, Acute-Coughing, headache, wheezing-grand daughter had pneumonia. Patient has been sick x 1week T 98 degrees here, but she reports low grade fever at night. Cough is from her granddaughter.  Persistent cough for the last 3 days.  She was given Hycodan which has not  been enough help for cough.  Scant sputum of yellow/clear.  Using her inhaler but has not pulled out her nebulizer machine and I encouraged her to do so.  Review of Systems-See HPI   + = positive Constitutional:   No-   weight loss, night sweats, fevers, chills, fatigue, lassitude. HEENT:   +  headaches, No-difficulty swallowing, tooth/dental problems, +sore throat,        sneezing, itching, ear ache, nasal congestion, +post nasal drip,  CV:  +atypical chest pain, orthopnea, PND, swelling in lower extremities, anasarca,  dizziness, palpitations Resp: +shortness of breath with exertion or at rest.              + productive cough,  + non-productive cough,  No- coughing up of blood.           change in color of mucus.  No- wheezing.   Skin: No-   rash or lesions. GI:  +  heartburn, indigestion, abdominal pain, +nausea, vomiting,  GU: MS:  No-   joint pain or swelling, + R flank pain Neuro-     nothing unusual Psych:  No- change in mood or affect. No depression or anxiety.  No memory loss.  Objective:  OBJ- Physical Exam General- Alert, Oriented, Affect-appropriate/ calm, Distress- none acute, + obese Skin- rash-none, lesions- none, excoriation+  Lymphadenopathy- none Head- atraumatic            Eyes- Gross vision intact, PERRLA, conjunctivae and secretions clear            Ears- Hearing, canals-normal            Nose- clear, no-Septal dev, mucus, polyps, erosion, perforation             Throat- Mallampati IV , mucosa clear ,  drainage- none, tonsils- atrophic,  + Upper plate and many missing teeth Neck- flexible , trachea midline, no stridor , thyroid nl, carotid no bruit Chest - symmetrical excursion , unlabored           Heart/CV- RRR , no murmur , no gallop  , no rub, nl s1 s2                           - JVD- none , edema-none, stasis changes- none, varices- none           Lung-  unlabored, wheeze- none, cough+active, dullness-none, rub- none           Chest wall-  Abd-  Br/ Gen/  Rectal- Not done, not indicated Extrem- cyanosis- none, clubbing, none, atrophy- none, strength- nl, + excoriated stasis dermatitis Neuro- grossly intact to observation

## 2022-07-24 NOTE — Telephone Encounter (Signed)
Spoke with patient. She complains of Productive cough, headache, low grade fever, wheezing, chest congestion and nasal congestion. Symptoms present x1 week- coughing has became worse over the last few days. Patient has been using inhaler- no relief. Patient has been scheduled today to see Dr. Maple Hudson. He is aware. NFN

## 2022-07-24 NOTE — Assessment & Plan Note (Signed)
Continuing Xarelto, managed by her PCP

## 2022-07-24 NOTE — Telephone Encounter (Signed)
Pt. Calling has been coughing and wants apt to see Dr. Maple Hudson told her I needed to ask her nurse and we will give call back to get her possibly on his sched.

## 2022-07-24 NOTE — Assessment & Plan Note (Signed)
Respiratory infection with acute bronchitis.  Management discussed. Plan-CXR, Depo-Medrol, doxycycline, stay hydrated.  We will add Tessalon Perles that she can use in between doses of Hycodan cough syrup as needed.

## 2022-07-24 NOTE — Patient Instructions (Addendum)
Order- depo 80   dx acute bronchitis  Order- CXR    dx acute bronchitis  Script sent for doxycycline  Script sent for tessalon perles - use this in between doses of your Hycodan cough syrup as needed for cough  Stay well hydrated.  Ok to keep the November 14 appointment, but let us know if you don't get better

## 2022-07-25 ENCOUNTER — Ambulatory Visit (INDEPENDENT_AMBULATORY_CARE_PROVIDER_SITE_OTHER): Payer: 59

## 2022-07-25 DIAGNOSIS — J209 Acute bronchitis, unspecified: Secondary | ICD-10-CM

## 2022-07-26 ENCOUNTER — Telehealth: Payer: Self-pay | Admitting: Internal Medicine

## 2022-07-26 MED ORDER — HYDROCODONE BIT-HOMATROP MBR 5-1.5 MG/5ML PO SOLN: 5.0000 mL | Freq: Four times a day (QID) | ORAL | 0 refills | Status: DC | PRN

## 2022-07-26 MED ORDER — HYDROCOD POLI-CHLORPHE POLI ER 10-8 MG/5ML PO SUER: 5.0000 mL | Freq: Two times a day (BID) | ORAL | 0 refills | Status: DC | PRN

## 2022-07-26 NOTE — Telephone Encounter (Signed)
Clarified- asking for tussionex. Hycodan cancelled

## 2022-07-26 NOTE — Telephone Encounter (Signed)
Asks cough syrup for persistent hard cough- Hycodan to [facility]

## 2022-08-06 ENCOUNTER — Telehealth: Payer: Self-pay | Admitting: Physician Assistant

## 2022-08-06 NOTE — Telephone Encounter (Signed)
[facility] is unable to do this request. I called [facility]. Lift chair cost from 1200-3000. No rx required. Not covered by insurance. Will file with insurance after purchase for reimbursement. I called and advised pt. She stated understanding

## 2022-08-06 NOTE — Telephone Encounter (Signed)
Patient called advised she need an order for her to get a lift chair? Patient said she can hardly get up out of a chair. The number to contact patient is [phone]

## 2022-08-06 NOTE — Telephone Encounter (Signed)
I called and advised pt

## 2022-08-06 NOTE — Telephone Encounter (Signed)
Is this ok to order? 

## 2022-08-06 NOTE — Telephone Encounter (Signed)
Tried to order via [facility]

## 2022-08-22 ENCOUNTER — Telehealth: Payer: Self-pay | Admitting: Internal Medicine

## 2022-08-22 DIAGNOSIS — I25112 Atherosclerotic heart disease of native coronary artery with refractory angina pectoris: Secondary | ICD-10-CM

## 2022-08-22 MED ORDER — ISOSORBIDE MONONITRATE ER 60 MG PO TB24: 90.0000 mg | ORAL_TABLET | Freq: Every day | ORAL | 3 refills | Status: DC

## 2022-08-22 NOTE — Telephone Encounter (Signed)
*  STAT* If patient is at the pharmacy, call can be transferred to refill team.   1. Which medications need to be refilled? (please list name of each medication and dose if known)    isosorbide mononitrate (IMDUR) 60 MG 24 hr tablet    2. Which pharmacy/location (including street and city if local pharmacy) is medication to be sent to?   [id number] [place], [place] - [address]    3. Do they need a 30 day or 90 day supply? 90

## 2022-08-22 NOTE — Telephone Encounter (Signed)
Refill for Isosorbide 60 mg taking 1 1/2 tablet daily [facility]

## 2022-09-04 ENCOUNTER — Encounter: Payer: Self-pay | Admitting: Internal Medicine

## 2022-09-04 DIAGNOSIS — I25112 Atherosclerotic heart disease of native coronary artery with refractory angina pectoris: Secondary | ICD-10-CM

## 2022-09-05 MED ORDER — ISOSORBIDE MONONITRATE ER 60 MG PO TB24: 90.0000 mg | ORAL_TABLET | Freq: Every day | ORAL | 3 refills | Status: DC

## 2022-09-08 ENCOUNTER — Other Ambulatory Visit (INDEPENDENT_AMBULATORY_CARE_PROVIDER_SITE_OTHER): Payer: 59

## 2022-09-08 ENCOUNTER — Encounter: Payer: Self-pay | Admitting: Orthopedic Surgery

## 2022-09-08 ENCOUNTER — Ambulatory Visit (INDEPENDENT_AMBULATORY_CARE_PROVIDER_SITE_OTHER): Payer: 59 | Admitting: Orthopedic Surgery

## 2022-09-08 DIAGNOSIS — M25561 Pain in right knee: Secondary | ICD-10-CM

## 2022-09-08 DIAGNOSIS — M1712 Unilateral primary osteoarthritis, left knee: Secondary | ICD-10-CM | POA: Diagnosis not present

## 2022-09-08 DIAGNOSIS — M25562 Pain in left knee: Secondary | ICD-10-CM

## 2022-09-08 DIAGNOSIS — M1711 Unilateral primary osteoarthritis, right knee: Secondary | ICD-10-CM

## 2022-09-08 MED ORDER — METHYLPREDNISOLONE ACETATE 40 MG/ML IJ SUSP
40.0000 mg | INTRAMUSCULAR | Status: AC | PRN
  Administered 2022-09-08: 40 mg via INTRA_ARTICULAR

## 2022-09-08 MED ORDER — LIDOCAINE HCL 1 % IJ SOLN
5.0000 mL | INTRAMUSCULAR | Status: AC | PRN
  Administered 2022-09-08: 5 mL

## 2022-09-08 MED ORDER — BUPIVACAINE HCL 0.25 % IJ SOLN
4.0000 mL | INTRAMUSCULAR | Status: AC | PRN
  Administered 2022-09-08: 4 mL via INTRA_ARTICULAR

## 2022-09-08 NOTE — Progress Notes (Signed)
Office Visit Note   Patient: Diane Barker           Date of Birth: [birth year]           MRN: [id number] Visit Date: 09/08/2022 Requested by: Creola Corn, MD [address] [place],  [place] [address] PCP: Creola Corn, MD  Subjective: Chief Complaint  Patient presents with   Left Knee - Pain   Right Knee - Pain    HPI: Diane Barker is a 72 y.o. female who presents to the office reporting acute on chronic bilateral knee pain right worse than left.  She has had injections in the knee in the past which have helped her.  She has known bilateral knee arthritis which has up until now been manageable.  Does take over-the-counter medication.  Has modified her activity.  Denies any interval history of injury or definite mechanical symptoms in the knee.              ROS: All systems reviewed are negative as they relate to the chief complaint within the history of present illness.  Patient denies fevers or chills.  Assessment & Plan: Visit Diagnoses:  1. Pain in both knees, unspecified chronicity   2. Primary osteoarthritis of right knee   3. Primary osteoarthritis of left knee     Plan: Impression is exacerbation of bilateral knee arthritis.  We will inject both knees today.  Aspirated about 15 cc from the right knee.  She will continue to follow-up with Delane Ginger and Dr. Magnus Ivan in the future.  Continue with nonweightbearing quad strengthening exercises.  Follow-Up Instructions: No follow-ups on file.   Orders:  Orders Placed This Encounter  Procedures   XR KNEE 3 VIEW RIGHT   XR Knee 1-2 Views Left   No orders of the defined types were placed in this encounter.     Procedures: Large Joint Inj: bilateral knee on 09/08/2022 9:19 AM Indications: diagnostic evaluation, joint swelling and pain Details: 18 G 1.5 in needle, superolateral approach  Arthrogram: No  Medications (Right): 5 mL lidocaine 1 %; 4 mL bupivacaine 0.25 %; 40 mg methylPREDNISolone acetate 40 MG/ML Medications (Left):  5 mL lidocaine 1 %; 4 mL bupivacaine 0.25 %; 40 mg methylPREDNISolone acetate 40 MG/ML Outcome: tolerated well, no immediate complications Procedure, treatment alternatives, risks and benefits explained, specific risks discussed. Consent was given by the patient. Immediately prior to procedure a time out was called to verify the correct patient, procedure, equipment, support staff and site/side marked as required. Patient was prepped and draped in the usual sterile fashion.       Clinical Data: No additional findings.  Objective: Vital Signs: There were no vitals taken for this visit.  Physical Exam:  Constitutional: Patient appears well-developed HEENT:  Head: Normocephalic Eyes:EOM are normal Neck: Normal range of motion Cardiovascular: Normal rate Pulmonary/chest: Effort normal Neurologic: Patient is alert Skin: Skin is warm Psychiatric: Patient has normal mood and affect  Ortho Exam: Ortho exam demonstrates range of motion on the right of 5-1 10 left 5-1 15.  Collateral crucial ligaments are stable.  Venous varicosities are present but Homan is negative no calf tenderness.  Extensor mechanism intact.  Pedal pulses palpable.  Trace effusion right knee no effusion left knee.  Specialty Comments:  No specialty comments available.  Imaging: XR Knee 1-2 Views Left  Result Date: 09/08/2022 AP lateral merchant radiographs left knee reviewed.  Moderate to severe tricompartmental knee arthritis is present worse in the medial compartment with  slight varus alignment and no acute fracture  XR KNEE 3 VIEW RIGHT  Result Date: 09/08/2022 AP lateral merchant radiographs right knee reviewed.  Moderate end-stage tricompartmental arthritis is present worse in the medial compartment.  Overall alignment intact.  No acute fracture    PMFS History: Patient Active Problem List   Diagnosis Date Noted   Hyperlipidemia 08/01/2020   AB (asthmatic bronchitis), mild persistent, with acute  exacerbation    Unstable angina (HCC) 01/16/2018   Primary osteoarthritis of both knees 07/29/2017   Cellulitis of right arm 10/07/2016   Sepsis (HCC) 10/07/2016   Chest pain 10/07/2016   Dyspnea on exertion 05/24/2015   DVT, lower extremity (HCC) 05/09/2014   Right calf pain 06/21/2013   Pulmonary embolism (HCC) 06/21/2013   Dysphagia 06/27/2011   Cough 06/27/2011   CHEST PAIN 01/18/2009   RHINITIS 04/13/2007   Obstructive chronic bronchitis with acute bronchitis (HCC) 04/13/2007   GERD 04/13/2007   Depression 01/21/2007   OSTEOARTHRITIS 01/21/2007   LOW BACK PAIN 01/21/2007   BREAST CANCER, HX OF 01/21/2007   Past Medical History:  Diagnosis Date   Anxiety    Arthritis    "all over my body" (01/15/2018)   Breast cancer, right breast (HCC) 2000   Rt lumpectomy, Chemo/XRT/rt axillary node    Bronchial asthma    Chronic lower back pain    Depression    Diverticulosis    DVT (deep venous thrombosis) (HCC) 06/2013   RLE   GERD (gastroesophageal reflux disease)    High cholesterol    Hx of adenomatous colonic polyps    Hypertension    Obesity    Osteoarthritis    Personal history of chemotherapy    Personal history of radiation therapy    Pulmonary embolism (HCC) 06/2013   "both lungs"   Spondylolisthesis     Family History  Problem Relation Age of Onset   Asthma Mother    COPD Mother    Heart attack Mother    Diabetes Mother    Lung cancer Father    Asthma Daughter    Kidney failure Brother    Diabetes Brother        x 2   Heart attack Brother    Breast cancer Maternal Aunt    Colon cancer Paternal Aunt    Diabetes Daughter        x 2   Hypertension Brother     Past Surgical History:  Procedure Laterality Date   BREAST BIOPSY Right 2000   BREAST LUMPECTOMY Right 2000   for CA txed w/ chemo and radiation, right   CARDIAC CATHETERIZATION  03/21/2020   CORONARY ATHERECTOMY N/A 04/03/2022   Procedure: CORONARY ATHERECTOMY;  Surgeon: Swaziland, Peter M, MD;   Location: [facility];  Service: Cardiovascular;  Laterality: N/A;   CORONARY IMAGING/OCT N/A 04/03/2022   Procedure: INTRAVASCULAR IMAGING/OCT;  Surgeon: Swaziland, Peter M, MD;  Location: [facility] INVASIVE CV LAB;  Service: Cardiovascular;  Laterality: N/A;   CORONARY PRESSURE/FFR STUDY N/A 01/18/2018   Procedure: INTRAVASCULAR PRESSURE WIRE/FFR STUDY;  Surgeon: Kathleene Hazel, MD;  Location: [facility];  Service: Cardiovascular;  Laterality: N/A;   CORONARY PRESSURE/FFR STUDY N/A 04/03/2022   Procedure: INTRAVASCULAR PRESSURE WIRE/FFR STUDY;  Surgeon: Swaziland, Peter M, MD;  Location: [facility] INVASIVE CV LAB;  Service: Cardiovascular;  Laterality: N/A;   CORONARY STENT INTERVENTION N/A 04/03/2022   Procedure: CORONARY STENT INTERVENTION;  Surgeon: Swaziland, Peter M, MD;  Location: [facility] INVASIVE CV LAB;  Service: Cardiovascular;  Laterality: N/A;   HERNIA REPAIR     LEFT HEART CATH AND CORONARY ANGIOGRAPHY N/A 01/18/2018   Procedure: LEFT HEART CATH AND CORONARY ANGIOGRAPHY;  Surgeon: Kathleene Hazel, MD;  Location: [facility];  Service: Cardiovascular;  Laterality: N/A;   LEFT HEART CATH AND CORONARY ANGIOGRAPHY N/A 03/21/2020   Procedure: LEFT HEART CATH AND CORONARY ANGIOGRAPHY;  Surgeon: Iran Ouch, MD;  Location: [facility];  Service: Cardiovascular;  Laterality: N/A;   LEFT HEART CATH AND CORONARY ANGIOGRAPHY N/A 04/03/2022   Procedure: LEFT HEART CATH AND CORONARY ANGIOGRAPHY;  Surgeon: Swaziland, Peter M, MD;  Location: [facility] INVASIVE CV LAB;  Service: Cardiovascular;  Laterality: N/A;   TUBAL LIGATION     UMBILICAL HERNIA REPAIR  1983   Social History   Occupational History   Occupation: Personnel officer    Comment: retired    Associate Professor: [employer]  Tobacco Use   Smoking status: Never   Smokeless tobacco: Never  Vaping Use   Vaping Use: Never used  Substance and Sexual Activity   Alcohol use: Never    Alcohol/week: 0.0 standard drinks of alcohol    Drug use: Never   Sexual activity: Not Currently

## 2022-09-09 ENCOUNTER — Ambulatory Visit: Payer: 59 | Admitting: Physician Assistant

## 2022-10-20 ENCOUNTER — Other Ambulatory Visit: Payer: Self-pay | Admitting: Internal Medicine

## 2022-10-20 ENCOUNTER — Other Ambulatory Visit: Payer: Self-pay

## 2022-10-20 MED ORDER — CLOPIDOGREL BISULFATE 75 MG PO TABS: 75.0000 mg | ORAL_TABLET | Freq: Every day | ORAL | 1 refills | Status: DC

## 2022-10-20 NOTE — Telephone Encounter (Signed)
Pt's medication was sent to pt's pharmacy as requested. Confirmation received.  °

## 2022-11-03 ENCOUNTER — Ambulatory Visit: Payer: 59 | Admitting: Physician Assistant

## 2022-11-10 ENCOUNTER — Ambulatory Visit: Payer: 59 | Admitting: Physician Assistant

## 2022-11-11 ENCOUNTER — Ambulatory Visit: Payer: 59 | Admitting: Physician Assistant

## 2022-11-26 ENCOUNTER — Ambulatory Visit: Payer: 59 | Attending: Internal Medicine | Admitting: Internal Medicine

## 2022-11-26 ENCOUNTER — Encounter: Payer: Self-pay | Admitting: Internal Medicine

## 2022-11-26 VITALS — BP 110/68 | HR 79 | Ht 65.5 in | Wt 231.0 lb

## 2022-11-26 DIAGNOSIS — R55 Syncope and collapse: Secondary | ICD-10-CM

## 2022-11-26 DIAGNOSIS — R0789 Other chest pain: Secondary | ICD-10-CM | POA: Diagnosis not present

## 2022-11-26 DIAGNOSIS — I10 Essential (primary) hypertension: Secondary | ICD-10-CM

## 2022-11-26 DIAGNOSIS — Z86711 Personal history of pulmonary embolism: Secondary | ICD-10-CM

## 2022-11-26 DIAGNOSIS — R0609 Other forms of dyspnea: Secondary | ICD-10-CM

## 2022-11-26 DIAGNOSIS — I200 Unstable angina: Secondary | ICD-10-CM | POA: Diagnosis not present

## 2022-11-26 DIAGNOSIS — Z86718 Personal history of other venous thrombosis and embolism: Secondary | ICD-10-CM

## 2022-11-26 DIAGNOSIS — I2510 Atherosclerotic heart disease of native coronary artery without angina pectoris: Secondary | ICD-10-CM | POA: Diagnosis not present

## 2022-11-26 DIAGNOSIS — Z6839 Body mass index (BMI) 39.0-39.9, adult: Secondary | ICD-10-CM

## 2022-11-26 DIAGNOSIS — Z955 Presence of coronary angioplasty implant and graft: Secondary | ICD-10-CM

## 2022-11-26 DIAGNOSIS — E785 Hyperlipidemia, unspecified: Secondary | ICD-10-CM

## 2022-11-26 DIAGNOSIS — M79604 Pain in right leg: Secondary | ICD-10-CM

## 2022-11-26 DIAGNOSIS — M79605 Pain in left leg: Secondary | ICD-10-CM

## 2022-11-26 DIAGNOSIS — Z79899 Other long term (current) drug therapy: Secondary | ICD-10-CM

## 2022-11-26 NOTE — Patient Instructions (Signed)
Medication Instructions:  The current medical regimen is effective;  continue present plan and medications as directed. Please refer to the Current Medication list given to you today.  *If you need a refill on your cardiac medications before your next appointment, please call your pharmacy*  Lab Work: NONE If you have labs (blood work) drawn today and your tests are completely normal, you will receive your results only by:  MyChart Message (if you have MyChart) OR  A paper copy in the mail If you have any lab test that is abnormal or we need to change your treatment, we will call you to review the results.  Testing/Procedures: NONE  Follow-Up: At [facility], you and your health needs are our priority.  As part of our continuing mission to provide you with exceptional heart care, we have created designated Provider Care Teams.  These Care Teams include your primary Cardiologist (physician) and Advanced Practice Providers (APPs -  Physician Assistants and Nurse Practitioners) who all work together to provide you with the care you need, when you need it.  Your next appointment:   6 month(s)  Provider:   Parke Poisson, MD     Other Instructions

## 2022-11-26 NOTE — Progress Notes (Signed)
Cardiology Office Note:    Date: 11/26/2022  ID:  KALEIGHA WHAN, DOB 1950/08/24, MRN [id number]  PCP:  Creola Corn, MD  Cardiologist:  Parke Poisson, MD  Electrophysiologist:  None   Referring MD: Creola Corn, MD   Chief Complaint/Reason for Referral: CAD  History of Present Illness:    Diane Barker is a 72 y.o. female with a history of nonobstructive coronary artery disease, GERD, HLD, HTN, obesity, and DVT and bilateral PE (2015) on chronic xarelto coming in today for routine f/u.  Today: no cardiovascular complaints. One isolated episode of CP after stent she feels due to stress. Continues to have significant life stressors, we discussed strategies for stress management. Feels cold all the itme but TSH normal. No bleeding on xarelto  Prior: On 04/03/22 she underwent a left heart cath with Dr. Peter Swaziland. She had a successful PCI of the mid LAD with DES x1. She followed up with him on 04/25/22. She was having some cath site pain but this has resolved since. On cath, noted to have vasoreactivity due to abnormal CFR, no fixed microvascular disease. Recommended intensive vasodilator therapy, nitrates and CCB.  She states that her fatigue has improved since the stent was placed.  She states that she has had a few brief episodes of the previously mentioned chest/arm pain but attributes this to musculoskeletal causes with lifting/moving heavy objects at work. She feels that she is utilizing muscles she does not typically activate which triggers this pain. It is relieved with Tylenol.  She is currently on the Plavix and Xarelto. Will likely d/c Plavix in July at the recommendation of Dr. Swaziland.   She reports having right knee pain since January and had some relief with a cortisone injection. She is continuing to follow with ortho for this.   Prior visits: She was hospitalized in early January 2022 for chest pain with a known proximal LAD lesion.  She was felt to have stable moderate  one-vessel coronary artery disease with 60 to 70% stenosis of the proximal RCA.  Felt to have slight progression of LAD disease but not felt to be enough to cause unstable angina.  Focus was on medical management of CAD.    She did not tolerate Repatha unfortunately, though may have had concomitant covid 19 infection with a positive test at that time. She does not feel that the infection caused her to feel unwell and feels it was related to repatha, she does not want to try [id number] again.  I have reviewed her prior PFTs which suggested exercise PFTS may be helpful and results of routine PFT showed: "Conclusions: The reduced lung volumes, increased FEV1/FVC ratio and diffusion defect suggest an interstitial process such as fibrosis or interstitial inflammation. Anemia cannot be excluded as a potential cause of the diffusion defect without correcting the observed diffusing capacity for hemoglobin. In view of the severity of the diffusion defect, studies with exercise would be helpful to evaluate the presence of hypoxemia."  DOE had previously been one of her anginal symptoms, in addition to chest pressure with left arm radiation. We discussed functional testing given recent cath in 2022. Cardiopulmonary exercise test on 10/14/21, CPET was mostly suggestive of deconditioning, mild chronotropic incompetence, and obesity related restrictive lung physiology. Marland Kitchen Her Echo 11/19/21 showed LVEF 55-60% and indeterminate diastolic parameters. We also discussed inclisiran, which she was going to consider.  On 03/13/2022 she called the office reporting intermittent chest pain which didn't occur every day, but could happen  1-2 times a day. Her chest pain resolved with one nitroglycerin, but afterwards developed soreness in the same area. She reports being under a lot of stress which has been known to exacerbate her chest pain in the past. Her chest pain is usually localized above her left breast. It then radiates upwards to  her left shoulder and neck, then progresses with distal radiation to the left arm. She has described this angina for me before, prompting her last cath. 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 She will note chest pain afterward, however she can also get chest pain at rest, and has chest pain currently. Of note, she takes dexilant which is managing her acid reflux very well; her chest pain is distinct from her acid reflux. No known history of esophageal spasm.  At her last visit with me on 03/27/22, she had nitro responsive chest and back pain in the office. Referred for cath.   She also complained of fatigue/malaise and daytime somnolence. She felt as though she wanted to sleep in bed all day. She has a history of depression and I was concerned that this is related. She was not on BB as this worsened depressive symptoms.   Past Medical History:  Diagnosis Date   Anxiety    Arthritis    "all over my body" (01/15/2018)   Breast cancer, right breast (HCC) 2000   Rt lumpectomy, Chemo/XRT/rt axillary node    Bronchial asthma    Chronic lower back pain    Depression    Diverticulosis    DVT (deep venous thrombosis) (HCC) 06/2013   RLE   GERD (gastroesophageal reflux disease)    High cholesterol    Hx of adenomatous colonic polyps    Hypertension    Obesity    Osteoarthritis    Personal history of chemotherapy    Personal history of radiation therapy    Pulmonary embolism (HCC) 06/2013   "both lungs"   Spondylolisthesis     Past Surgical History:  Procedure Laterality Date   BREAST BIOPSY Right 2000   BREAST LUMPECTOMY Right 2000   for CA txed w/ chemo and radiation, right   CARDIAC CATHETERIZATION  03/21/2020   CORONARY ATHERECTOMY N/A 04/03/2022   Procedure: CORONARY ATHERECTOMY;  Surgeon: Swaziland, Peter M, MD;  Location: [facility] INVASIVE CV LAB;  Service: Cardiovascular;  Laterality: N/A;   CORONARY IMAGING/OCT N/A 04/03/2022   Procedure:  INTRAVASCULAR IMAGING/OCT;  Surgeon: Swaziland, Peter M, MD;  Location: [facility] INVASIVE CV LAB;  Service: Cardiovascular;  Laterality: N/A;   CORONARY PRESSURE/FFR STUDY N/A 01/18/2018   Procedure: INTRAVASCULAR PRESSURE WIRE/FFR STUDY;  Surgeon: Kathleene Hazel, MD;  Location: [facility] INVASIVE CV LAB;  Service: Cardiovascular;  Laterality: N/A;   CORONARY PRESSURE/FFR STUDY N/A 04/03/2022   Procedure: INTRAVASCULAR PRESSURE WIRE/FFR STUDY;  Surgeon: Swaziland, Peter M, MD;  Location: [facility] INVASIVE CV LAB;  Service: Cardiovascular;  Laterality: N/A;   CORONARY STENT INTERVENTION N/A 04/03/2022   Procedure: CORONARY STENT INTERVENTION;  Surgeon: Swaziland, Peter M, MD;  Location: [facility] INVASIVE CV LAB;  Service: Cardiovascular;  Laterality: N/A;   HERNIA REPAIR     LEFT HEART CATH AND CORONARY ANGIOGRAPHY N/A 01/18/2018   Procedure: LEFT HEART CATH AND CORONARY ANGIOGRAPHY;  Surgeon: Kathleene Hazel, MD;  Location: [facility] INVASIVE CV LAB;  Service: Cardiovascular;  Laterality: N/A;   LEFT HEART CATH AND CORONARY ANGIOGRAPHY N/A 03/21/2020   Procedure: LEFT HEART CATH AND CORONARY ANGIOGRAPHY;  Surgeon: Iran Ouch, MD;  Location: [facility] INVASIVE CV LAB;  Service: Cardiovascular;  Laterality: N/A;   LEFT HEART CATH AND CORONARY ANGIOGRAPHY N/A 04/03/2022   Procedure: LEFT HEART CATH AND CORONARY ANGIOGRAPHY;  Surgeon: Swaziland, Peter M, MD;  Location: [facility] INVASIVE CV LAB;  Service: Cardiovascular;  Laterality: N/A;   TUBAL LIGATION     UMBILICAL HERNIA REPAIR  1983    Current Medications:  Current Outpatient Medications:    acetaminophen (TYLENOL) 500 MG tablet, Take 500-1,000 mg by mouth every 6 (six) hours as needed for mild pain., Disp: , Rfl:    amLODipine (NORVASC) 2.5 MG tablet, Take 1 tablet (2.5 mg total) by mouth daily., Disp: 90 tablet, Rfl: 3   atorvastatin (LIPITOR) 40 MG tablet, Take 1 tablet (40 mg total) by mouth daily., Disp: 90 tablet, Rfl: 2   clopidogrel (PLAVIX) 75 MG tablet, Take 1 tablet (75  mg total) by mouth daily with breakfast., Disp: 90 tablet, Rfl: 1   dexlansoprazole (DEXILANT) 60 MG capsule, Take 60 mg by mouth in the morning., Disp: , Rfl:    escitalopram (LEXAPRO) 10 MG tablet, Take 10 mg by mouth in the morning., Disp: , Rfl:    isosorbide mononitrate (IMDUR) 60 MG 24 hr tablet, Take 1.5 tablets (90 mg total) by mouth daily., Disp: 135 tablet, Rfl: 3   nitroGLYCERIN (NITROSTAT) 0.4 MG SL tablet, Place 1 tablet (0.4 mg total) under the tongue every 5 (five) minutes as needed., Disp: 25 tablet, Rfl: 3   PROAIR HFA 108 (90 Base) MCG/ACT inhaler, Inhale 2 puffs into the lungs every 4 (four) hours as needed for wheezing or shortness of breath., Disp: 54 g, Rfl: 4   rivaroxaban (XARELTO) 20 MG TABS tablet, Take 20 mg by mouth in the morning., Disp: , Rfl:    Spacer/Aero-Holding Chambers (AEROCHAMBER MV) inhaler, Use as instructed, Disp: 1 each, Rfl: 0   Allergies:   E.e.s. [erythromycin], Lipitor [atorvastatin], Morphine and codeine, Neurontin [gabapentin], Omnicef [cefdinir], Penicillins, and Relafen [nabumetone]   Social History   Tobacco Use   Smoking status: Never   Smokeless tobacco: Never  Vaping Use   Vaping status: Never Used  Substance Use Topics   Alcohol use: Never    Alcohol/week: 0.0 standard drinks of alcohol   Drug use: Never     Family History: The patient's family history includes Asthma in her daughter and mother; Breast cancer in her maternal aunt; COPD in her mother; Colon cancer in her paternal aunt; Diabetes in her brother, daughter, and mother; Heart attack in her brother and mother; Hypertension in her brother; Kidney failure in her brother; Lung cancer in her father.  ROS:   Please see the history of present illness.     All other systems reviewed and are negative.  EKGs/Labs/Other Studies Reviewed:    The following studies were reviewed today:  Left heart cath 03/24/22:   Ost LAD to Prox LAD lesion is 10% stenosed.   Prox Cx to Mid Cx  lesion is 30% stenosed.   Ost RCA to Prox RCA lesion is 20% stenosed.   Mid RCA lesion is 20% stenosed.   Ost 1st Diag lesion is 30% stenosed.   Ost RPDA to RPDA lesion is 20% stenosed.   Ost 2nd Diag lesion is 30% stenosed.   Mid LAD lesion is 60% stenosed.   Prox LAD lesion is 65% stenosed.   A drug-eluting stent was successfully placed using a STENT ONYX FRONTIER 3.0X34.   Post intervention,  there is a 0% residual stenosis.   Post intervention, there is a 0% residual stenosis.   The left ventricular systolic function is normal.   LV end diastolic pressure is mildly elevated.   The left ventricular ejection fraction is 55-65% by visual estimate. Single vessel obstructive CAD involving the mid LAD which is heavily calcified. Flow analysis suggest no fixed microvascular disease but abnormal CFR suggesting significant vasoreactivity abnormality.  So she has both epicardial disease and abnormal vasoreactivity.  Normal LV function Mildly elevated LVEDP Successful PCI of the mid LAD with OCT guidance and DES x 1  Echo 11/19/2021: Sonographer Comments: Technically difficult study due to poor echo windows and suboptimal apical window. Image acquisition challenging due to patient body habitus.  IMPRESSIONS   1. Left ventricular ejection fraction, by estimation, is 55 to 60%. The  left ventricle has normal function. The left ventricle has no regional  wall motion abnormalities. Left ventricular diastolic parameters are  indeterminate.   2. Right ventricular systolic function is normal. The right ventricular  size is normal. Tricuspid regurgitation signal is inadequate for assessing  PA pressure.   3. Left atrial size was moderately dilated.   4. The mitral valve is degenerative. Mild mitral valve regurgitation. No  evidence of mitral stenosis.   5. The aortic valve is tricuspid. Aortic valve regurgitation is trivial.  No aortic stenosis is present.  Comparison(s): No significant change from  prior study. 01/16/18 EF 55-60%.   Cardiopulmonary Exercise Test 10/14/2021: Conclusion: Exercise testing with gas exchange demonstrates normal functional capacity when compared to matched sedentary norms. There is no indication for cardiopulmonary abnormality. Patient appears primarily ventilatory limited due to body habitus.   Bilateral LE Venous Doppler 01/28/2021: Summary:  BILATERAL:  - No evidence of deep vein thrombosis seen in the lower extremities,  bilaterally.  - No evidence of superficial venous thrombosis in the lower extremities,  bilaterally.  -No evidence of popliteal cyst, bilaterally.  RIGHT:  - Findings consistent with chronic superficial vein thrombosis involving  the right small saphenous vein.   Monitor 11/16/19 IMPRESSION: infrequent ectopy, brief episode of asymptomatic SVT. Symptoms associated with SR and sinus tachycardia.   Lower Venous DVT 06/20/19 RIGHT:  - No evidence of common femoral vein obstruction.  LEFT:  - There is no evidence of deep vein thrombosis in the lower extremity.  - A cystic structure is found in the popliteal fossa extending into the  proximal medial calf, suggestive of possible ruptured Baker's cyst.    EKG: EKG is personally reviewed. EKG Interpretation Date/Time:  Wednesday November 26 2022 16:19:23 EDT Ventricular Rate:  79 PR Interval:  166 QRS Duration:  96 QT Interval:  386 QTC Calculation: 442 R Axis:   -31  Text Interpretation: Normal sinus rhythm Possible Left atrial enlargement Left axis deviation Incomplete right bundle branch block When compared with ECG of 03-Apr-2022 09:42, No significant change was found Confirmed by Weston Brass ([id number]) on 11/26/2022 5:06:38 PM   06/06/22: Not ordered today. 03/27/2022:  Sinus rhythm. LVH.  10/10/21: NSR irbbb, lvh 01/15/21: NSR, rate 65 bpm 07/09/2020: NSR, incomplete RBBB, non-specific t waves abnormality in the lateral leads, Rate: 72 bpm 12/28/2019: NSR, possible  LVH   Recent Labs: 03/28/2022: BUN 16; Creatinine, Ser 0.94; Hemoglobin 12.8; Platelets 223; Potassium 4.1; Sodium 142   Recent Lipid Panel    Component Value Date/Time   CHOL 130 03/22/2020 0042   TRIG 58 03/22/2020 0042   HDL 46 03/22/2020 0042   CHOLHDL 2.8  03/22/2020 0042   VLDL 12 03/22/2020 0042   LDLCALC 72 03/22/2020 0042    Physical Exam:    VS:  BP 110/68 (BP Location: Left Arm, Patient Position: Sitting, Cuff Size: Large)   Pulse 79   Ht 5' 5.5" (1.664 m)   Wt 231 lb (104.8 kg)   BMI 37.86 kg/m     Wt Readings from Last 5 Encounters:  11/26/22 231 lb (104.8 kg)  07/24/22 233 lb 3.2 oz (105.8 kg)  06/06/22 229 lb 12.8 oz (104.2 kg)  04/25/22 235 lb 12.8 oz (107 kg)  04/03/22 240 lb (108.9 kg)    Constitutional: No acute distress Eyes: sclera non-icteric, normal conjunctiva and lids ENMT: normal dentition, moist mucous membranes Cardiovascular: regular rhythm, normal rate. 1/6 systolic murmur. S1 and S2 normal.  No jugular venous distention.  Respiratory: clear to auscultation bilaterally GI : normal bowel sounds, soft and nontender. No distention.   MSK: extremities warm, well perfused. Excoriations medial left leg. Venous stasis changes. NEURO: grossly nonfocal exam, moves all extremities. PSYCH: alert and oriented x 3, normal mood and affect.   ASSESSMENT:    1. Coronary artery disease involving native coronary artery of native heart without angina pectoris   2. S/P coronary artery stent placement   3. Other chest pain   4. Essential hypertension   5. History of DVT (deep vein thrombosis)   6. History of pulmonary embolus (PE)   7. Hyperlipidemia, unspecified hyperlipidemia type   8. Syncope and collapse   9. BMI 39.0-39.9,adult   10. Dyspnea on exertion   11. Pain in both lower extremities   12. Medication management     PLAN:    Chest pain CAD Med management -Does not tolerate beta-blocker due to worsening depression -Did not tolerate  Repatha for unclear reasons, she may be amenable to inclisiran. - Imdur 90 mg daily with good control of angina symptoms. - Had DES to mid LAD on 04/03/22 with Dr. Peter Swaziland.  -noted vasoreactivity on cath, which we were strongly suspicious for. Continue imdur and amlodipine.  - stop plavix today. Indefinite Xarelto for CAD and PE.  HTN - Blood pressures well controlled. - Continue Norvasc 2.5mg  daily and imdur 90 mg daily.  HLD - lipids stable, LDL of 76. - Continue Lipitor 40mg  daily. Consider inclisiran, patient defers.    DOE - CPET showed obesity related restriction of lung physiology, deconditioning and mild chronotropic incompetance - repeat echo unchanged.  - recommend sleep study for DOE and fatigue.   History of DVT and PE - continue Xarelto. No bleeding events. - Again recommend OTC compression socks.  Syncope - no interval syncope.  Obesity - complicating symptoms above.    Total time of encounter: 30 minutes total time of encounter, including 20 minutes spent in face-to-face patient care on the date of this encounter. This time includes coordination of care and counseling regarding above mentioned problem list. Remainder of non-face-to-face time involved reviewing chart documents/testing relevant to the patient encounter and documentation in the medical record. I have independently reviewed documentation from referring provider.   Weston Brass, MD, [facility]  [facility]   Medication Adjustments/Labs and Tests Ordered: Current medicines are reviewed at length with the patient today.  Concerns regarding medicines are outlined above.   Orders Placed This Encounter  Procedures   EKG 12-Lead   No orders of the defined types were placed in this encounter.  Patient Instructions  Medication Instructions:  The current medical regimen  is effective;  continue present plan and medications as directed. Please refer to the Current Medication list given  to you today.  *If you need a refill on your cardiac medications before your next appointment, please call your pharmacy*  Lab Work: NONE If you have labs (blood work) drawn today and your tests are completely normal, you will receive your results only by:  MyChart Message (if you have MyChart) OR  A paper copy in the mail If you have any lab test that is abnormal or we need to change your treatment, we will call you to review the results.  Testing/Procedures: NONE  Follow-Up: At [facility], you and your health needs are our priority.  As part of our continuing mission to provide you with exceptional heart care, we have created designated Provider Care Teams.  These Care Teams include your primary Cardiologist (physician) and Advanced Practice Providers (APPs -  Physician Assistants and Nurse Practitioners) who all work together to provide you with the care you need, when you need it.  Your next appointment:   6 month(s)  Provider:   Parke Poisson, MD     Other Instructions

## 2022-11-29 ENCOUNTER — Encounter: Payer: Self-pay | Admitting: Internal Medicine

## 2022-12-22 ENCOUNTER — Ambulatory Visit (INDEPENDENT_AMBULATORY_CARE_PROVIDER_SITE_OTHER): Payer: 59 | Admitting: Physician Assistant

## 2022-12-22 ENCOUNTER — Encounter: Payer: Self-pay | Admitting: Physician Assistant

## 2022-12-22 DIAGNOSIS — M170 Bilateral primary osteoarthritis of knee: Secondary | ICD-10-CM | POA: Diagnosis not present

## 2022-12-22 DIAGNOSIS — M1712 Unilateral primary osteoarthritis, left knee: Secondary | ICD-10-CM

## 2022-12-22 DIAGNOSIS — M1711 Unilateral primary osteoarthritis, right knee: Secondary | ICD-10-CM

## 2022-12-22 MED ORDER — METHYLPREDNISOLONE ACETATE 40 MG/ML IJ SUSP
40.0000 mg | INTRAMUSCULAR | Status: AC | PRN
Start: 2022-12-22 — End: 2022-12-22
  Administered 2022-12-22: 40 mg via INTRA_ARTICULAR

## 2022-12-22 MED ORDER — LIDOCAINE HCL 1 % IJ SOLN
3.0000 mL | INTRAMUSCULAR | Status: AC | PRN
  Administered 2022-12-22: 3 mL

## 2022-12-22 MED ORDER — LIDOCAINE HCL 1 % IJ SOLN
5.0000 mL | INTRAMUSCULAR | Status: AC | PRN
  Administered 2022-12-22: 5 mL

## 2022-12-22 NOTE — Progress Notes (Signed)
Procedure Note  Patient: Diane Barker             Date of Birth: 01-22-1951           MRN: [id number]             Visit Date: 12/22/2022  HPI: Diane Barker 72 year old female comes in today with bilateral knee pain.  She has pain whenever she first gets up to ambulate.  She has had no acute falls or injuries.  States the last cortisone injection was given on 09/08/2022 by Dr. August Saucer were helpful.  She has known bilateral knee arthritis.  She is on chronic anticoagulation.  Bilateral knees: Good range of motion of both knees.  Patellofemoral crepitus bilateral knees.  Left knee slight effusion.  No abnormal warmth erythema of either knee.  No instability valgus varus stressing laterally. Procedures: Visit Diagnoses:  1. Primary osteoarthritis of right knee   2. Primary osteoarthritis of left knee     Large Joint Inj: bilateral knee on 12/22/2022 11:39 AM Indications: pain Details: 22 G 1.5 in needle, anterolateral approach  Arthrogram: No  Medications (Right): 3 mL lidocaine 1 %; 40 mg methylPREDNISolone acetate 40 MG/ML Medications (Left): 5 mL lidocaine 1 %; 40 mg methylPREDNISolone acetate 40 MG/ML Aspirate (Left): 7 mL blood-tinged Outcome: tolerated well, no immediate complications Procedure, treatment alternatives, risks and benefits explained, specific risks discussed. Consent was given by the patient. Immediately prior to procedure a time out was called to verify the correct patient, procedure, equipment, support staff and site/side marked as required. Patient was prepped and draped in the usual sterile fashion.     Plan: Discussed with her quad strengthening exercises.  Follow-up as needed knows to wait at least 3 months between injections.  Questions were encouraged and answered at length.

## 2023-01-28 NOTE — Progress Notes (Unsigned)
Patient ID: Diane Barker, female    DOB: 04/09/1950, 72 y.o.   MRN: [id number]  HPI F never smoker followed for bronchitis with hx rhinitis, GERD, hx breast cancer, Hx DVT/ PE/ Xarelto. Office Spirometry 04/30/2015- WNL- FVC 2.75/87%, FEV1 2.29/93%, FEV1/FVC 0.83, FEF 25-75 percent 2.83/126%. PFT 01/08/2018- insignificant response to dilator, mild restriction, diffusion mildly reduced.  FVC 2.52/78%, FEV1 2.21/90%, ratio 1.88, FEF 25-75% 3.30/259%, TLC 75%, DLCO 63% CPET 10/14/21- Conclusion: Exercise testing with gas exchange demonstrates normal functional capacity when compared to matched sedentary norms. There is no indication for cardiopulmonary abnormality. Patient appears primarily ventilatory limited due to body habitus.  ECHO 11/19/21- WNL ------------------------------------------------------------------------------   07/24/22--72 year old female never smoker followed for Bronchitis, Rhinitis, complicated by GERD, history breast cancer/XRT, history DVT/PE/Xarelto, Covid infection November 2020, CAD,  -Ventolin hfa, Neb Duoneb, Acute-Coughing, headache, wheezing-grand daughter had pneumonia. Patient has been sick x 1week T 98 degrees here, but she reports low grade fever at night. Cough is from her granddaughter.  Persistent cough for the last 3 days.  She was given Hycodan which has not been enough help for cough.  Scant sputum of yellow/clear.  Using her inhaler but has not pulled out her nebulizer machine and I encouraged her to do so.  01/29/23- 72 year old female never smoker followed for Bronchitis, Rhinitis, complicated by GERD, hx DVT/PE 2015,  history Breast Cancer/XRT, history DVT/PE/Xarelto, CAD/ stents, Covid infection November 2020,  -Ventolin hfa, [employer], She had reported Markus Daft was too strong for her. -----Breathing has been good  Satisfied with her albuterol, used occasionally. Not wheezing. For 2 weeks has had tight ache in L calf. No chest pain or systemic issues. Had  DVT/PE in 2015. Remains on Xarelto but has concern possible recurrence. Discussed D-dimer/ dopplers. Much family stress. Daughter and grand children living w her. Agrees to referral to [facility]. She asks about getting a PCP in [facility] system so records can share. CXR 07/30/22-  IMPRESSION: No active cardiopulmonary disease.  Review of Systems-See HPI   + = positive Constitutional:   No-   weight loss, night sweats, fevers, chills, fatigue, lassitude. HEENT:   +  headaches, No-difficulty swallowing, tooth/dental problems, +sore throat,        sneezing, itching, ear ache, nasal congestion, +post nasal drip,  CV:  +atypical chest pain, orthopnea, PND, swelling in lower extremities, anasarca,  dizziness, palpitations Resp: +shortness of breath with exertion or at rest.              + productive cough,  + non-productive cough,  No- coughing up of blood.           change in color of mucus.  No- wheezing.   Skin: No-   rash or lesions. GI:  +  heartburn, indigestion, abdominal pain, +nausea, vomiting,  GU: MS:  + L calf pain Neuro-     nothing unusual Psych:  No- change in mood or affect. No depression or anxiety.  No memory loss.  Objective:  OBJ- Physical Exam General- Alert, Oriented, Affect-+episodic crying, then calm, Distress- none acute, + obese Skin- rash-none, lesions- none, excoriation+  Lymphadenopathy- none Head- atraumatic            Eyes- Gross vision intact, PERRLA, conjunctivae and secretions clear            Ears- Hearing, canals-normal            Nose- clear, no-Septal dev, mucus, polyps, erosion, perforation  Throat- Mallampati IV , mucosa clear , drainage- none, tonsils- atrophic,  + Upper plate and many missing teeth Neck- flexible , trachea midline, no stridor , thyroid nl, carotid no bruit Chest - symmetrical excursion , unlabored           Heart/CV- RRR , no murmur , no gallop  , no rub, nl s1 s2                           - JVD- none , edema-none,  stasis changes- none, varices- none           Lung-  unlabored, wheeze- none, cough-none, dullness-none, rub- none           Chest wall-  Abd-  Br/ Gen/ Rectal- Not done, not indicated Extrem- Neg Homan's L, No edema. Neuro- grossly intact to observation

## 2023-01-29 ENCOUNTER — Encounter: Payer: Self-pay | Admitting: Internal Medicine

## 2023-01-29 ENCOUNTER — Ambulatory Visit: Payer: 59 | Admitting: Internal Medicine

## 2023-01-29 VITALS — BP 118/74 | HR 58 | Ht 65.5 in | Wt 228.0 lb

## 2023-01-29 DIAGNOSIS — I824Z1 Acute embolism and thrombosis of unspecified deep veins of right distal lower extremity: Secondary | ICD-10-CM | POA: Diagnosis not present

## 2023-01-29 DIAGNOSIS — F320 Major depressive disorder, single episode, mild: Secondary | ICD-10-CM

## 2023-01-29 DIAGNOSIS — M79669 Pain in unspecified lower leg: Secondary | ICD-10-CM | POA: Diagnosis not present

## 2023-01-29 DIAGNOSIS — J4530 Mild persistent asthma, uncomplicated: Secondary | ICD-10-CM

## 2023-01-29 DIAGNOSIS — F419 Anxiety disorder, unspecified: Secondary | ICD-10-CM

## 2023-01-29 LAB — D-DIMER, QUANTITATIVE: D-Dimer, Quant: 0.41 ug{FEU}/mL (ref <0.50)

## 2023-01-29 NOTE — Assessment & Plan Note (Signed)
Good control. Feels albuterol is sufficient, Plan- no changes needed now.

## 2023-01-29 NOTE — Patient Instructions (Signed)
Order- lab- D-dimer    dx calf pain  Order- Doppler bilateral leg veins     dx calf pain, hx DVT  Ask Florentina Addison to help you access the [facility] Patient web site for help locating a primary care doctor for you in the [facility] system.  Order- referral to Dr Dawayne Cirri group at [facility]  dx Anxiety/ Depression

## 2023-01-29 NOTE — Assessment & Plan Note (Signed)
Now 2 weeks tight "toothache" L calf. Has been getting steroid inj for osteoarthritis in knees. Continues Xarelto Plan- D-dimer, Doppler leg veins

## 2023-01-29 NOTE — Assessment & Plan Note (Signed)
Episodic crying while here, talking about family stress.  Plan- agrees to referral to [facility]. Needs to talk and needs direction to community resources.

## 2023-02-05 ENCOUNTER — Other Ambulatory Visit: Payer: Self-pay

## 2023-02-05 DIAGNOSIS — M79661 Pain in right lower leg: Secondary | ICD-10-CM

## 2023-02-05 DIAGNOSIS — I824Z1 Acute embolism and thrombosis of unspecified deep veins of right distal lower extremity: Secondary | ICD-10-CM

## 2023-02-06 ENCOUNTER — Encounter (HOSPITAL_COMMUNITY): Payer: 59

## 2023-02-09 ENCOUNTER — Other Ambulatory Visit: Payer: Self-pay | Admitting: Internal Medicine

## 2023-02-09 ENCOUNTER — Ambulatory Visit: Payer: 59 | Admitting: Physician Assistant

## 2023-02-09 DIAGNOSIS — Z1231 Encounter for screening mammogram for malignant neoplasm of breast: Secondary | ICD-10-CM

## 2023-02-18 ENCOUNTER — Ambulatory Visit (HOSPITAL_COMMUNITY)
Admission: RE | Admit: 2023-02-18 | Discharge: 2023-02-18 | Disposition: A | Discharge status: Home / Self Care | Payer: 59 | Source: Ambulatory Visit | Attending: Cardiovascular Disease | Admitting: Cardiovascular Disease

## 2023-02-18 DIAGNOSIS — M79661 Pain in right lower leg: Secondary | ICD-10-CM | POA: Insufficient documentation

## 2023-02-18 DIAGNOSIS — I824Z1 Acute embolism and thrombosis of unspecified deep veins of right distal lower extremity: Secondary | ICD-10-CM | POA: Diagnosis not present

## 2023-02-18 DIAGNOSIS — M79662 Pain in left lower leg: Secondary | ICD-10-CM | POA: Diagnosis not present

## 2023-02-19 ENCOUNTER — Other Ambulatory Visit: Payer: Self-pay | Admitting: Internal Medicine

## 2023-02-22 ENCOUNTER — Other Ambulatory Visit: Payer: Self-pay | Admitting: Internal Medicine

## 2023-03-05 ENCOUNTER — Ambulatory Visit: Payer: 59 | Admitting: Physician Assistant

## 2023-03-16 ENCOUNTER — Encounter: Payer: Self-pay | Admitting: Physician Assistant

## 2023-03-16 ENCOUNTER — Ambulatory Visit: Payer: 59 | Admitting: Physician Assistant

## 2023-03-16 DIAGNOSIS — M25561 Pain in right knee: Secondary | ICD-10-CM

## 2023-03-16 DIAGNOSIS — M1711 Unilateral primary osteoarthritis, right knee: Secondary | ICD-10-CM | POA: Diagnosis not present

## 2023-03-16 MED ORDER — METHYLPREDNISOLONE ACETATE 40 MG/ML IJ SUSP
40.0000 mg | INTRAMUSCULAR | Status: AC | PRN
  Administered 2023-03-16: 40 mg via INTRA_ARTICULAR

## 2023-03-16 MED ORDER — LIDOCAINE HCL 1 % IJ SOLN
3.0000 mL | INTRAMUSCULAR | Status: AC | PRN
  Administered 2023-03-16: 3 mL

## 2023-03-16 NOTE — Progress Notes (Signed)
   Procedure Note  Patient: Diane Barker             Date of Birth: 04-21-1950           MRN: [id number]             Visit Date: 03/16/2023 HPI: [name] comes in today due to bilateral knee pain.  She has known osteoarthritis both knees.  She states that her right knee is bothering her right the most today.  She did have a Doppler lower extremities ordered by her pulmonologist and she is found to have a Baker's cyst left knee.  States she has had no new injury.  Last cortisone injections given on 12/22/2022 only gave her relief for about 2 weeks.  Review of systems: See HPI otherwise negative  Physical exam:  General: Well-developed well-nourished female in no acute distress ambulates with an antalgic gait no assistive device. Right knee positive effusion.  Global tenderness no abnormal warmth erythema.  Full extension flexion to approximately 110 degrees.  Procedures: Visit Diagnoses:  1. Primary osteoarthritis of right knee     Large Joint Inj: R knee on 03/16/2023 1:57 PM Indications: pain Details: 22 G 1.5 in needle, anterolateral approach  Arthrogram: No  Medications: 3 mL lidocaine 1 %; 40 mg methylPREDNISolone acetate 40 MG/ML Aspirate: 38 mL yellow Outcome: tolerated well, no immediate complications Procedure, treatment alternatives, risks and benefits explained, specific risks discussed. Consent was given by the patient. Immediately prior to procedure a time out was called to verify the correct patient, procedure, equipment, support staff and site/side marked as required. Patient was prepped and draped in the usual sterile fashion.     Plan: She knows to wait at least 3 months between cortisone injections.  Ace bandage was applied she will remove this this evening before retiring to bed.  Follow-up as needed.

## 2023-03-17 ENCOUNTER — Ambulatory Visit
Admission: RE | Admit: 2023-03-17 | Discharge: 2023-03-17 | Disposition: A | Discharge status: Home / Self Care | Payer: 59 | Source: Ambulatory Visit | Attending: Internal Medicine | Admitting: Internal Medicine

## 2023-03-17 DIAGNOSIS — Z1231 Encounter for screening mammogram for malignant neoplasm of breast: Secondary | ICD-10-CM

## 2023-04-06 ENCOUNTER — Other Ambulatory Visit: Payer: Self-pay | Admitting: Internal Medicine

## 2023-04-16 ENCOUNTER — Ambulatory Visit: Payer: 59 | Admitting: Physician Assistant

## 2023-04-16 ENCOUNTER — Encounter: Payer: Self-pay | Admitting: Physician Assistant

## 2023-04-16 DIAGNOSIS — M1712 Unilateral primary osteoarthritis, left knee: Secondary | ICD-10-CM

## 2023-04-16 MED ORDER — LIDOCAINE HCL 1 % IJ SOLN
3.0000 mL | INTRAMUSCULAR | Status: AC | PRN
  Administered 2023-04-16: 3 mL

## 2023-04-16 MED ORDER — METHYLPREDNISOLONE ACETATE 40 MG/ML IJ SUSP
40.0000 mg | INTRAMUSCULAR | Status: AC | PRN
  Administered 2023-04-16: 40 mg via INTRA_ARTICULAR

## 2023-04-16 NOTE — Progress Notes (Signed)
   Procedure Note  Patient: Diane Barker             Date of Birth: 12/05/1950           MRN: [id number]             Visit Date: 04/16/2023 HPI: Mrs. Scheibe comes in today due to left knee pain.  She is someone that has known osteoarthritis of her left knee.  She has had no new injury.  She does state the knee gives way at times.  She has pain whenever she first stands up and begins to walk and walks with a limp.  Does use a cane is not using today.  She wears compression stockings.  She states she uses ice packs hot water soaks and wearing Tylenol she is unable to take NSAIDs.  She is wondering what else can be done in regards to her knee pain.  She has tried braces without any real relief.  Review of systems: See HPI otherwise negative.  Denies any fevers or chills.  Physical exam: General: Well-developed well-nourished female in no acute distress.  Mood affect appropriate. Psych: Alert and oriented x 3 Bilateral knees: Good range of motion of both knees.  No abnormal warmth erythema or effusions patellofemoral crepitus with range of motion both knees.  No instability valgus varus stressing.  Procedures: Visit Diagnoses:  1. Primary osteoarthritis of left knee     Large Joint Inj: L knee on 04/16/2023 3:43 PM Indications: pain Details: 22 G 1.5 in needle, anterolateral approach  Arthrogram: No  Medications: 3 mL lidocaine 1 %; 40 mg methylPREDNISolone acetate 40 MG/ML Outcome: tolerated well, no immediate complications Procedure, treatment alternatives, risks and benefits explained, specific risks discussed. Consent was given by the patient. Immediately prior to procedure a time out was called to verify the correct patient, procedure, equipment, support staff and site/side marked as required. Patient was prepped and draped in the usual sterile fashion.    Plan: Reviewed her previous radiographs which showed near bone-on-bone arthritis medial compartment of the left knee along with  severe patellofemoral arthritis.  Lateral compartment overall well-preserved with mild arthritic changes.  At this point time she is not interested in any type of surgical intervention.  We decided to place a cortisone injection today and see how she does overall.  She knows to follow-up as needed and understands to wait 3 months between cortisone injections.

## 2023-05-04 ENCOUNTER — Other Ambulatory Visit: Payer: Self-pay | Admitting: Internal Medicine

## 2023-05-04 ENCOUNTER — Other Ambulatory Visit: Payer: Self-pay | Admitting: *Deleted

## 2023-05-04 ENCOUNTER — Other Ambulatory Visit: Payer: Self-pay

## 2023-05-04 ENCOUNTER — Other Ambulatory Visit (HOSPITAL_COMMUNITY): Payer: Self-pay

## 2023-05-04 DIAGNOSIS — I25112 Atherosclerotic heart disease of native coronary artery with refractory angina pectoris: Secondary | ICD-10-CM

## 2023-05-04 DIAGNOSIS — I824Z1 Acute embolism and thrombosis of unspecified deep veins of right distal lower extremity: Secondary | ICD-10-CM

## 2023-05-04 MED ORDER — RIVAROXABAN 20 MG PO TABS: 20.0000 mg | ORAL_TABLET | Freq: Every morning | ORAL | 5 refills | Status: AC

## 2023-05-04 MED ORDER — AMLODIPINE BESYLATE 2.5 MG PO TABS
2.5000 mg | ORAL_TABLET | Freq: Every day | ORAL | 2 refills | Status: DC
  Filled 2023-05-04: qty 90, 90d supply, fill #0

## 2023-05-04 MED ORDER — ISOSORBIDE MONONITRATE ER 60 MG PO TB24
90.0000 mg | ORAL_TABLET | Freq: Every day | ORAL | 2 refills | Status: DC
  Filled 2023-05-04: qty 135, 90d supply, fill #0

## 2023-05-04 MED ORDER — ATORVASTATIN CALCIUM 40 MG PO TABS
40.0000 mg | ORAL_TABLET | Freq: Every day | ORAL | 2 refills | Status: DC
  Filled 2023-05-04: qty 90, 90d supply, fill #0

## 2023-05-04 NOTE — Telephone Encounter (Signed)
*  STAT* If patient is at the pharmacy, call can be transferred to refill team.   1. Which medications need to be refilled? (please list name of each medication and dose if known) amLODipine (NORVASC) 2.5 MG tablet Take 1 tablet (2.5 mg total) by mouth daily.   2. Would you like to learn more about the convenience, safety, & potential cost savings by using the [facility]? No   3. Are you open to using the [facility] No   4. Which pharmacy/location (including street and city if local pharmacy) is medication to be sent to?[id number] - [place], [place] - [address]    5. Do they need a 30 day or 90 day supply? 90 Day Supply  Pt is currently out of medication.   *STAT* If patient is at the pharmacy, call can be transferred to refill team.   1. Which medications need to be refilled? (please list name of each medication and dose if known) atorvastatin (LIPITOR) 40 MG tablet   Take 1 tablet (40 mg total) by mouth daily.    dexlansoprazole (DEXILANT) 60 MG capsule   Take 60 mg by mouth in the morning.   isosorbide mononitrate (IMDUR) 60 MG 24 hr tablet Take 1.5 tablets (90 mg total) by mouth daily.   rivaroxaban (XARELTO) 20 MG TABS tablet    Take 20 mg by mouth in the morning.   2. Would you like to learn more about the convenience, safety, & potential cost savings by using the [facility]? No   3. Are you open to using the [facility] No   4. Which pharmacy/location (including street and city if local pharmacy) is medication to be sent to?[id number] - [place], [place] - [address]    5. Do they need a 30 day or 90 day supply? 90 Day Supply

## 2023-05-05 ENCOUNTER — Telehealth: Payer: Self-pay | Admitting: Internal Medicine

## 2023-05-05 ENCOUNTER — Other Ambulatory Visit (HOSPITAL_COMMUNITY): Payer: Self-pay

## 2023-05-05 DIAGNOSIS — I25112 Atherosclerotic heart disease of native coronary artery with refractory angina pectoris: Secondary | ICD-10-CM

## 2023-05-05 MED ORDER — AMLODIPINE BESYLATE 2.5 MG PO TABS: 2.5000 mg | ORAL_TABLET | Freq: Every day | ORAL | 2 refills | Status: DC

## 2023-05-05 MED ORDER — ATORVASTATIN CALCIUM 40 MG PO TABS: 40.0000 mg | ORAL_TABLET | Freq: Every day | ORAL | 2 refills | Status: AC

## 2023-05-05 MED ORDER — ISOSORBIDE MONONITRATE ER 60 MG PO TB24: 90.0000 mg | ORAL_TABLET | Freq: Every day | ORAL | 2 refills | Status: DC

## 2023-05-05 NOTE — Telephone Encounter (Signed)
 Pt's medication was sent to pt's pharmacy as requested. Confirmation received.

## 2023-05-05 NOTE — Telephone Encounter (Signed)
*  STAT* If patient is at the pharmacy, call can be transferred to refill team.   1. Which medications need to be refilled? (please list name of each medication and dose if known) amLODipine (NORVASC) 2.5 MG tablet   2. Which pharmacy/location (including street and city if local pharmacy) is medication to be sent to? [id number] - [place], [place] - [address]   3. Do they need a 30 day or 90 day supply? 90  This was sent to the wrong pharmacy.

## 2023-05-14 ENCOUNTER — Ambulatory Visit: Payer: 59 | Attending: Internal Medicine | Admitting: Internal Medicine

## 2023-05-14 ENCOUNTER — Encounter: Payer: Self-pay | Admitting: Internal Medicine

## 2023-05-14 VITALS — BP 122/68 | HR 65 | Ht 65.5 in | Wt 227.8 lb

## 2023-05-14 DIAGNOSIS — Z86711 Personal history of pulmonary embolism: Secondary | ICD-10-CM

## 2023-05-14 DIAGNOSIS — I25112 Atherosclerotic heart disease of native coronary artery with refractory angina pectoris: Secondary | ICD-10-CM | POA: Diagnosis not present

## 2023-05-14 DIAGNOSIS — I10 Essential (primary) hypertension: Secondary | ICD-10-CM

## 2023-05-14 DIAGNOSIS — Z86718 Personal history of other venous thrombosis and embolism: Secondary | ICD-10-CM

## 2023-05-14 DIAGNOSIS — Z955 Presence of coronary angioplasty implant and graft: Secondary | ICD-10-CM | POA: Diagnosis not present

## 2023-05-14 DIAGNOSIS — M79604 Pain in right leg: Secondary | ICD-10-CM

## 2023-05-14 DIAGNOSIS — M79605 Pain in left leg: Secondary | ICD-10-CM

## 2023-05-14 NOTE — Patient Instructions (Signed)
 Medication Instructions:  No Changes  Lab Work: None  Testing/Procedures: None  Follow-Up: At [facility], you and your health needs are our priority.  As part of our continuing mission to provide you with exceptional heart care, we have created designated Provider Care Teams.  These Care Teams include your primary Cardiologist (physician) and Advanced Practice Providers (APPs -  Physician Assistants and Nurse Practitioners) who all work together to provide you with the care you need, when you need it.   Your next appointment:   6 month(s)  Provider:   Parke Poisson, MD

## 2023-05-14 NOTE — Progress Notes (Unsigned)
 Cardiology Office Note:  .   Date:  05/14/2023  ID:  Diane Barker, DOB [birth year], MRN [id number] PCP: Creola Corn, MD  [facility] Providers Cardiologist:  Parke Poisson, MD    History of Present Illness: .   Diane Barker is a 73 y.o. female.  Discussed the use of AI scribe software for clinical note transcription with the patient, who gave verbal consent to proceed.  History of Present Illness   The patient, with a history of coronary artery disease and breast cancer, presents with multiple health concerns. She reports unintentional weight loss, which she attributes to a decreased appetite and increased craving for sweets. She denies any associated symptoms such as fatigue or changes in bowel habits.  The patient also complains of leg pain, which she attributes to recent knee injections. The pain is severe enough to affect her mobility, particularly at night. She suspects she might be suffering from sciatica, but this has not been confirmed by a healthcare professional.  In addition to the leg pain, the patient reports urinary issues. She describes a sensation of incomplete bladder emptying and frequent dribbling. She denies any associated pain, blood in the urine, or recent changes in urinary habits.  The patient also mentions a history of heart disease, for which she is currently on medication. She reports occasional arm pain, but denies any chest pain or other cardiac symptoms. She also has a history of breast cancer, with no current complaints related to this condition.        ROS: negative except per HPI above.  Studies Reviewed: Marland Kitchen   EKG Interpretation Date/Time:  Thursday May 14 2023 08:39:44 EST Ventricular Rate:  65 PR Interval:  170 QRS Duration:  94 QT Interval:  388 QTC Calculation: 403 R Axis:   -32  Text Interpretation: Normal sinus rhythm Left axis deviation When compared with ECG of 26-Nov-2022 16:19, No significant change was found Confirmed by  Weston Brass ([id number]) on 05/14/2023 9:13:23 AM    Results   LABS LDL: 63 (02/04/2023) Triglycerides: 72 (02/04/2023) HDL: 52 (02/04/2023)  DIAGNOSTIC EKG: Normal (05/14/2023)     Risk Assessment/Calculations:             Physical Exam:   VS:  BP 122/68 (BP Location: Left Arm, Patient Position: Sitting, Cuff Size: Normal)   Pulse 65   Ht 5' 5.5" (1.664 m)   Wt 227 lb 12.8 oz (103.3 kg)   SpO2 98%   BMI 37.33 kg/m    Wt Readings from Last 3 Encounters:  05/14/23 227 lb 12.8 oz (103.3 kg)  01/29/23 228 lb (103.4 kg)  11/26/22 231 lb (104.8 kg)     Physical Exam   CHEST: Lungs clear to auscultation bilaterally. CARDIOVASCULAR: Heart sounds normal. EXTREMITIES: Trace ankle edema.     GEN: Well nourished, well developed in no acute distress NECK: No JVD; No carotid bruits CARDIAC: RRR, no murmurs, rubs, gallops RESPIRATORY:  Clear to auscultation without rales, wheezing or rhonchi  ABDOMEN: Soft, non-tender, non-distended EXTREMITIES:  trace edema; No deformity   ASSESSMENT AND PLAN: .    1. Coronary artery disease involving native coronary artery of native heart with refractory angina pectoris (HCC)     Assessment and Plan    Coronary Artery Disease Stable, no chest pain. On Imdur 60mg  daily, Atorvastatin 40mg  daily, and Xarelto 20mg  daily. LDL was 63 on 02/04/2023. -Continue current medications. -Intermediate risk for knee surgery from a cardiac perspective as of today with no  current chest pain.  Osteoarthritis Pain in knees, receiving injections. Possible sciatica with pain from left buttock down leg. -Orthopedic follow-up next Thursday.  Urinary Incontinence Dribbling and incomplete emptying. -Consider referral to urology or gynecology.  General Health Maintenance  -Follow-up in 6 months.     DVT/PE - continue xarelto 20 mg daily, no bleeding.  HTN - continue amlodipine 2.5 mg daily, imdur 60 mg daily, BP well controlled.

## 2023-05-15 ENCOUNTER — Encounter: Payer: Self-pay | Admitting: Internal Medicine

## 2023-05-18 ENCOUNTER — Encounter (HOSPITAL_COMMUNITY): Payer: Self-pay | Admitting: Emergency Medicine

## 2023-05-18 ENCOUNTER — Encounter: Payer: 59 | Admitting: Physician Assistant

## 2023-05-18 ENCOUNTER — Emergency Department (HOSPITAL_COMMUNITY)
Admission: EM | Admit: 2023-05-18 | Discharge: 2023-05-19 | Disposition: A | Discharge status: Home / Self Care | Attending: Emergency Medicine | Admitting: Emergency Medicine

## 2023-05-18 ENCOUNTER — Emergency Department (HOSPITAL_COMMUNITY)

## 2023-05-18 ENCOUNTER — Other Ambulatory Visit: Payer: Self-pay

## 2023-05-18 DIAGNOSIS — N3000 Acute cystitis without hematuria: Secondary | ICD-10-CM | POA: Diagnosis not present

## 2023-05-18 DIAGNOSIS — R509 Fever, unspecified: Secondary | ICD-10-CM | POA: Diagnosis not present

## 2023-05-18 DIAGNOSIS — R112 Nausea with vomiting, unspecified: Secondary | ICD-10-CM | POA: Insufficient documentation

## 2023-05-18 DIAGNOSIS — R197 Diarrhea, unspecified: Secondary | ICD-10-CM | POA: Diagnosis not present

## 2023-05-18 DIAGNOSIS — R300 Dysuria: Secondary | ICD-10-CM | POA: Diagnosis present

## 2023-05-18 LAB — CBC
HCT: 41.8 % (ref 36.0–46.0)
Hemoglobin: 13.8 g/dL (ref 12.0–15.0)
MCH: 33.3 pg (ref 26.0–34.0)
MCHC: 33 g/dL (ref 30.0–36.0)
MCV: 100.7 fL — ABNORMAL HIGH (ref 80.0–100.0)
Platelets: 238 10*3/uL (ref 150–400)
RBC: 4.15 MIL/uL (ref 3.87–5.11)
RDW: 12.8 % (ref 11.5–15.5)
WBC: 9.7 10*3/uL (ref 4.0–10.5)
nRBC: 0 % (ref 0.0–0.2)

## 2023-05-18 LAB — RESP PANEL BY RT-PCR (RSV, FLU A&B, COVID)  RVPGX2
Influenza A by PCR: NEGATIVE
Influenza B by PCR: NEGATIVE
Resp Syncytial Virus by PCR: NEGATIVE
SARS Coronavirus 2 by RT PCR: NEGATIVE

## 2023-05-18 LAB — URINALYSIS, ROUTINE W REFLEX MICROSCOPIC
Bilirubin Urine: NEGATIVE
Glucose, UA: NEGATIVE mg/dL
Hgb urine dipstick: NEGATIVE
Ketones, ur: NEGATIVE mg/dL
Nitrite: NEGATIVE
Protein, ur: NEGATIVE mg/dL
Specific Gravity, Urine: 1.028 (ref 1.005–1.030)
pH: 5 (ref 5.0–8.0)

## 2023-05-18 LAB — HEPATIC FUNCTION PANEL
ALT: 14 U/L (ref 0–44)
AST: 19 U/L (ref 15–41)
Albumin: 3.6 g/dL (ref 3.5–5.0)
Alkaline Phosphatase: 68 U/L (ref 38–126)
Bilirubin, Direct: 0.2 mg/dL (ref 0.0–0.2)
Indirect Bilirubin: 0.7 mg/dL (ref 0.3–0.9)
Total Bilirubin: 0.9 mg/dL (ref 0.0–1.2)
Total Protein: 7.2 g/dL (ref 6.5–8.1)

## 2023-05-18 LAB — TROPONIN I (HIGH SENSITIVITY)
Troponin I (High Sensitivity): 4 ng/L (ref <18)
Troponin I (High Sensitivity): 3 ng/L (ref <18)

## 2023-05-18 LAB — LIPASE, BLOOD: Lipase: 24 U/L (ref 11–51)

## 2023-05-18 LAB — BASIC METABOLIC PANEL
Anion gap: 12 (ref 5–15)
BUN: 21 mg/dL (ref 8–23)
CO2: 22 mmol/L (ref 22–32)
Calcium: 9 mg/dL (ref 8.9–10.3)
Chloride: 103 mmol/L (ref 98–111)
Creatinine, Ser: 0.8 mg/dL (ref 0.44–1.00)
GFR, Estimated: >60 mL/min (ref >60)
Glucose, Bld: 119 mg/dL — ABNORMAL HIGH (ref 70–99)
Potassium: 3.4 mmol/L — ABNORMAL LOW (ref 3.5–5.1)
Sodium: 137 mmol/L (ref 135–145)

## 2023-05-18 MED ORDER — ACETAMINOPHEN 325 MG PO TABS
650.0000 mg | ORAL_TABLET | Freq: Once | ORAL | Status: AC
  Administered 2023-05-18: 650 mg via ORAL
  Filled 2023-05-18: qty 2

## 2023-05-18 MED ORDER — ONDANSETRON HCL 4 MG/2ML IJ SOLN
4.0000 mg | Freq: Once | INTRAMUSCULAR | Status: AC
  Administered 2023-05-18: 4 mg via INTRAVENOUS
  Filled 2023-05-18: qty 2

## 2023-05-18 MED ORDER — IOHEXOL 350 MG/ML SOLN
75.0000 mL | Freq: Once | INTRAVENOUS | Status: AC | PRN
  Administered 2023-05-18: 75 mL via INTRAVENOUS

## 2023-05-18 MED ORDER — SULFAMETHOXAZOLE-TRIMETHOPRIM 800-160 MG PO TABS: 1.0000 | ORAL_TABLET | Freq: Two times a day (BID) | ORAL | 0 refills | Status: AC

## 2023-05-18 MED ORDER — SODIUM CHLORIDE 0.9 % IV BOLUS
1000.0000 mL | Freq: Once | INTRAVENOUS | Status: AC
  Administered 2023-05-18: 1000 mL via INTRAVENOUS

## 2023-05-18 MED ORDER — ONDANSETRON 4 MG PO TBDP: 4.0000 mg | ORAL_TABLET | Freq: Three times a day (TID) | ORAL | 0 refills | Status: DC | PRN

## 2023-05-18 MED ORDER — SODIUM CHLORIDE 0.9 % IV SOLN
1.0000 g | Freq: Once | INTRAVENOUS | Status: AC
  Administered 2023-05-19: 1 g via INTRAVENOUS
  Filled 2023-05-18: qty 10

## 2023-05-18 NOTE — ED Provider Notes (Signed)
 [place] EMERGENCY DEPARTMENT AT [facility] Provider Note   CSN: [id number] Arrival date & time: 05/18/23  1005    History  Chief Complaint  Patient presents with   Emesis   Weakness    Diane Barker is a 73 y.o. female 0230 with CP, N/V/D. 7 episodes of NBNB emesis since this morning. CP has had previously, seen Cards for it. Took nitroglycerin at home, didn't help. Better since 0930 however no PO intake since this morning. Thinks CP due to emesis earlier. Hx of PE on DOAC. No LE pain ,swelling. Does not feel like prior PE.  Had some dysuria and lower abdominal pain over the last few days. Few episodes of diarrhea. No blood in stool. Feels like a UTI. Hx of similar.  Noted to have fever 102.3 at home and with EMS  No cough ,congestion, rhinorrhea, sore throat, back pain, le swelling, pain, rashes or lesions.   No recent illness, Grandchildren had flu last month.      HPI     Home Medications Prior to Admission medications   Medication Sig Start Date End Date Taking? Authorizing Provider  ondansetron (ZOFRAN-ODT) 4 MG disintegrating tablet Take 1 tablet (4 mg total) by mouth every 8 (eight) hours as needed. 05/18/23  Yes Joffrey Kerce A, PA-C  sulfamethoxazole-trimethoprim (BACTRIM DS) 800-160 MG tablet Take 1 tablet by mouth 2 (two) times daily for 7 days. 05/18/23 05/25/23 Yes Mahogani Holohan A, PA-C  acetaminophen (TYLENOL) 500 MG tablet Take 500-1,000 mg by mouth every 6 (six) hours as needed for mild pain.    [provider]  albuterol (VENTOLIN HFA) 108 (90 Base) MCG/ACT inhaler INHALE 2 PUFFS INTO THE LUNGS EVERY 4 HOURS AS NEEDED FOR WHEEZING OR SHORTNESS OF BREATH. 04/06/23   Jetty Duhamel D, MD  amLODipine (NORVASC) 2.5 MG tablet Take 1 tablet (2.5 mg total) by mouth daily. 05/05/23   Parke Poisson, MD  atorvastatin (LIPITOR) 40 MG tablet Take 1 tablet (40 mg total) by mouth daily. 05/05/23   Parke Poisson, MD  dexlansoprazole (DEXILANT)  60 MG capsule Take 60 mg by mouth in the morning.    [provider]  escitalopram (LEXAPRO) 10 MG tablet Take 10 mg by mouth in the morning.    [provider]  isosorbide mononitrate (IMDUR) 60 MG 24 hr tablet Take 1.5 tablets (90 mg total) by mouth daily. 05/05/23   Parke Poisson, MD  nitroGLYCERIN (NITROSTAT) 0.4 MG SL tablet Place 1 tablet (0.4 mg total) under the tongue every 5 (five) minutes as needed. 03/27/22   Parke Poisson, MD  rivaroxaban (XARELTO) 20 MG TABS tablet Take 1 tablet (20 mg total) by mouth in the morning. 05/04/23   Parke Poisson, MD  Spacer/Aero-Holding Chambers (AEROCHAMBER MV) inhaler Use as instructed 07/30/18   Waymon Budge, MD      Allergies    E.e.s. [erythromycin], Lipitor [atorvastatin], Morphine and codeine, Neurontin [gabapentin], Omnicef [cefdinir], Penicillins, and Relafen [nabumetone]    Review of Systems   Review of Systems  Constitutional: Negative.   HENT: Negative.    Respiratory: Negative.    Cardiovascular:  Positive for chest pain.  Gastrointestinal:  Positive for abdominal pain, diarrhea, nausea and vomiting.  Genitourinary:  Positive for dysuria, frequency and urgency.  Musculoskeletal: Negative.   Skin: Negative.   Neurological: Negative.   All other systems reviewed and are negative.   Physical Exam Updated Vital Signs BP 115/60   Pulse 87  Temp (!) 100.5 F (38.1 C) (Oral)   Resp (!) 28   SpO2 94%  Physical Exam Vitals and nursing note reviewed.  Constitutional:      General: She is not in acute distress.    Appearance: She is well-developed. She is not ill-appearing, toxic-appearing or diaphoretic.  HENT:     Head: Normocephalic and atraumatic.     Nose: Nose normal.     Mouth/Throat:     Mouth: Mucous membranes are moist.  Eyes:     Pupils: Pupils are equal, round, and reactive to light.  Cardiovascular:     Rate and Rhythm: Normal rate.     Pulses: Normal pulses.          Radial  pulses are 2+ on the right side and 2+ on the left side.       Dorsalis pedis pulses are 2+ on the right side and 2+ on the left side.     Heart sounds: Normal heart sounds.  Pulmonary:     Effort: Pulmonary effort is normal. No respiratory distress.     Breath sounds: Normal breath sounds and air entry.  Abdominal:     General: Bowel sounds are normal. There is no distension.     Palpations: Abdomen is soft. There is no mass.     Tenderness: There is abdominal tenderness. There is no right CVA tenderness, left CVA tenderness, guarding or rebound.     Hernia: No hernia is present.  Musculoskeletal:        General: No swelling, tenderness, deformity or signs of injury. Normal range of motion.     Cervical back: Normal range of motion.     Right lower leg: No edema.     Left lower leg: No edema.     Comments: Full rom, no bony tenderness  Skin:    General: Skin is warm and dry.     Capillary Refill: Capillary refill takes less than 2 seconds.     Comments: No rashes or lesions on exposed skin  Neurological:     General: No focal deficit present.     Mental Status: She is alert.  Psychiatric:        Mood and Affect: Mood normal.    ED Results / Procedures / Treatments   Labs (all labs ordered are listed, but only abnormal results are displayed) Labs Reviewed  BASIC METABOLIC PANEL - Abnormal; Notable for the following components:      Result Value   Potassium 3.4 (*)    Glucose, Bld 119 (*)    All other components within normal limits  CBC - Abnormal; Notable for the following components:   MCV 100.7 (*)    All other components within normal limits  URINALYSIS, ROUTINE W REFLEX MICROSCOPIC - Abnormal; Notable for the following components:   Leukocytes,Ua SMALL (*)    Bacteria, UA RARE (*)    All other components within normal limits  RESP PANEL BY RT-PCR (RSV, FLU A&B, COVID)  [id number]  GASTROINTESTINAL PANEL BY PCR, STOOL (REPLACES STOOL CULTURE)  C DIFFICILE QUICK SCREEN W  PCR REFLEX    URINE CULTURE  HEPATIC FUNCTION PANEL  LIPASE, BLOOD  TROPONIN I (HIGH SENSITIVITY)  TROPONIN I (HIGH SENSITIVITY)    EKG None  Radiology CT ABDOMEN PELVIS W CONTRAST Result Date: 05/18/2023 CLINICAL DATA:  Acute nonlocalized abdominal pain, fever, shortness of breath EXAM: CT ABDOMEN AND PELVIS WITH CONTRAST TECHNIQUE: Multidetector CT imaging of the abdomen and pelvis was performed using  the standard protocol following bolus administration of intravenous contrast. RADIATION DOSE REDUCTION: This exam was performed according to the departmental dose-optimization program which includes automated exposure control, adjustment of the mA and/or kV according to patient size and/or use of iterative reconstruction technique. CONTRAST:  75mL OMNIPAQUE IOHEXOL 350 MG/ML SOLN COMPARISON:  CT abdomen and pelvis 11/13/2013 FINDINGS: Lower chest: Chest is reported separately. Hepatobiliary: No acute abnormality. Pancreas: Fatty atrophy without acute abnormality. Spleen: Unremarkable. Adrenals/Urinary Tract: Normal adrenal glands. Moderate left pelvicaliectasis with abrupt transition point at the UPJ, slightly progressed from 11/13/2013. This is favored chronic as there is excreted contrast beyond the transition point in the proximal left ureter on delayed imaging. Punctate 1 mm stone at the right UVJ (series 8/image 67). No hydronephrosis on the right. Unremarkable bladder. Stomach/Bowel: Normal caliber large and small bowel. Extensive sigmoid diverticulosis without evidence of diverticulitis. The appendix is not visualized. Small hiatal hernia. Vascular/Lymphatic: Aortic atherosclerosis. Misty mesentery about a few subcentimeter lymph nodes in the central mesentery Reproductive: Uterus and bilateral adnexa are unremarkable. Other: No free intraperitoneal fluid or air. Musculoskeletal: No acute fracture. Bilateral L5 pars defects with grade 1 anterolisthesis of L5. IMPRESSION: 1. Punctate 1 mm stone at  the right UVJ. No hydronephrosis on the right. 2. Moderate left pelvicaliectasis with abrupt transition point at the UPJ, slightly progressed from 11/13/2013. 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 On delayed phase, there is excreted contrast beyond the site of stenosis. 3. Extensive sigmoid diverticulosis without evidence of diverticulitis. 4. Misty mesentery about a few subcentimeter lymph nodes in the central mesentery, nonspecific but can be seen in the setting of mesenteric panniculitis. 5. Chronic L5 spondylolysis with grade 1 spondylolisthesis. 6. Aortic Atherosclerosis (ICD10-I70.0). Electronically Signed   By: Minerva Fester M.D.   On: 05/18/2023 22:28   CT Angio Chest PE W and/or Wo Contrast Result Date: 05/18/2023 CLINICAL DATA:  Nausea and vomiting with chest pain, initial encounter EXAM: CT ANGIOGRAPHY CHEST WITH CONTRAST TECHNIQUE: Multidetector CT imaging of the chest was performed using the standard protocol during bolus administration of intravenous contrast. Multiplanar CT image reconstructions and MIPs were obtained to evaluate the vascular anatomy. RADIATION DOSE REDUCTION: This exam was performed according to the departmental dose-optimization program which includes automated exposure control, adjustment of the mA and/or kV according to patient size and/or use of iterative reconstruction technique. CONTRAST:  75mL OMNIPAQUE IOHEXOL 350 MG/ML SOLN COMPARISON:  Chest x-ray from earlier in the same day. FINDINGS: Cardiovascular: Right-sided thoracic aortic arch is noted. Aberrant left subclavian artery is seen. Atherosclerotic calcifications are noted. No aneurysmal dilatation or dissection is seen. No cardiac enlargement is noted. The pulmonary artery shows a normal branching pattern bilaterally. No intraluminal filling defect to suggest pulmonary embolism is noted. Heavy coronary calcifications are seen. Mediastinum/Nodes: Thoracic inlet is within normal limits. No hilar or mediastinal  adenopathy is noted. Small mediastinal lymph nodes are noted likely reactive in nature. The esophagus as visualized shows evidence of a small sliding-type hiatal hernia. Lungs/Pleura: Lungs are well aerated bilaterally. No focal infiltrate or effusion is noted. Mild mosaic attenuation is noted consistent with air trapping. No parenchymal nodule is noted. Upper Abdomen: Visualized upper abdomen shows no acute abnormality. Musculoskeletal: Degenerative changes of the thoracic spine are seen. No rib abnormality is noted. Review of the MIP images confirms the above findings. IMPRESSION: No evidence of pulmonary emboli. Fall sliding-type hiatal hernia. No other focal abnormality is noted. Electronically Signed   By: Eulah Pont.D.  On: 05/18/2023 22:21   DG Chest 2 View Result Date: 05/18/2023 CLINICAL DATA:  Chest pain. EXAM: CHEST - 2 VIEW COMPARISON:  Chest radiograph dated Jul 25, 2022. FINDINGS: The heart size and mediastinal contours are unchanged. Right-sided aortic arch again noted. Similar right perihilar scarring. No focal consolidation, pleural effusion, or pneumothorax. Degenerative changes of the thoracic spine. No acute osseous abnormality. IMPRESSION: No acute cardiopulmonary findings. Electronically Signed   By: Hart Robinsons M.D.   On: 05/18/2023 12:19    Procedures Procedures    Medications Ordered in ED Medications  cefTRIAXone (ROCEPHIN) 1 g in sodium chloride 0.9 % 100 mL IVPB (1 g Intravenous New Bag/Given 05/19/23 0001)  sodium chloride 0.9 % bolus 1,000 mL (0 mLs Intravenous Stopped 05/18/23 1829)  ondansetron (ZOFRAN) injection 4 mg (4 mg Intravenous Given 05/18/23 1547)  iohexol (OMNIPAQUE) 350 MG/ML injection 75 mL (75 mLs Intravenous Contrast Given 05/18/23 1916)  acetaminophen (TYLENOL) tablet 650 mg (650 mg Oral Given 05/18/23 2220)    ED Course/ Medical Decision Making/ A&P Clinical Course as of 05/19/23 0016  Mon May 18, 2023  2347 With Dr. Dalbert Mayotte with urology.  He has  reviewed patient's imaging today.  He is also reviewed her prior imaging.  Imaging appears similar.  He thinks this is likely chronic.  Does not think patient symptoms due to new stone. Ok to FU outpatient per urology [BH]    Clinical Course User Index [BH] Diane Barker A, PA-C   73 year old here for evaluation multiple complaints.  Woke up this morning with nausea, vomiting, diarrhea, abdominal pain as well as chest pain.  History of PE and DVT however does not feel similar.  No pain or swelling the legs.  Took a nitro at home however vomited back up.  Feels dehydrated and nauseated, emesis improved earlier.  She is some diffuse pain to her lower abdomen as well as endorses some dysuria as well as frequency.  She thought she has a history of UTI.  Does have history of stones.  Will plan on labs imaging and reassess  Labs and imaging personally viewed and interpreted:  Viral panel negative UA small leuks, rare bacteria CBC without leukocytosis Metabolic panel without significant abnormality X-ray without acute abnormality CT angio chest with hiatal hernia, sliding CT on pelvis with diverticulosis without diverticulitis.  Does mention a 1 mm right UPJ stone   Discussed with Dr. Jennette Dubin with Urology will review imaging given her fever  Discussed again with urology, Dr. Dalbert Mayotte.  He feels imaging findings are similar to what she has had previously and feels like this is likely chronic and not the source of her fever.  Recommend treat for any  infection and can FU outpatient in office. Does not need admission from urology perspective  Discussed plan with patient.  Will give rocephin in ED.  Patient states she has tolerated this previously.  She has been able to tolerate p.o. intake here.  Patient does not meet SIRS criteria.  Could also possibly have a virus as cause of her symptoms.  Tolerating p.o. intake here.  Plan on dc after abx, attempted to add a urine culture to prior urine however  no remaining urine lab.  Will get culture here given symptoms with equivocal urine  Discussed results with patient.  Plan to DC home.  The patient has been appropriately medically screened and/or stabilized in the ED. I have low suspicion for any other emergent medical condition which would require further screening, evaluation or  treatment in the ED or require inpatient management.  Patient is hemodynamically stable and in no acute distress.  Patient able to ambulate in department prior to ED.  Evaluation does not show acute pathology that would require ongoing or additional emergent interventions while in the emergency department or further inpatient treatment.  I have discussed the diagnosis with the patient and answered all questions.  Pain is been managed while in the emergency department and patient has no further complaints prior to discharge.  Patient is comfortable with plan discussed in room and is stable for discharge at this time.  I have discussed strict return precautions for returning to the emergency department.  Patient was encouraged to follow-up with PCP/specialist refer to at discharge.                                 Medical Decision Making Amount and/or Complexity of Data Reviewed Independent Historian: EMS External Data Reviewed: labs, radiology, ECG and notes. Labs: ordered. Decision-making details documented in ED Course. Radiology: ordered and independent interpretation performed. Decision-making details documented in ED Course. ECG/medicine tests: ordered and independent interpretation performed. Decision-making details documented in ED Course.  Risk OTC drugs. Prescription drug management. Parenteral controlled substances. Decision regarding hospitalization. Diagnosis or treatment significantly limited by social determinants of health.         Final Clinical Impression(s) / ED Diagnoses Final diagnoses:  Acute cystitis without hematuria  Nausea vomiting  and diarrhea    Rx / DC Orders ED Discharge Orders          Ordered    sulfamethoxazole-trimethoprim (BACTRIM DS) 800-160 MG tablet  2 times daily        05/18/23 2348    ondansetron (ZOFRAN-ODT) 4 MG disintegrating tablet  Every 8 hours PRN        05/18/23 2348              Arlis Everly A, PA-C 05/19/23 0016    Horton, Clabe Seal, DO 05/19/23 2328

## 2023-05-18 NOTE — ED Notes (Signed)
 Awaiting patient from lobby.

## 2023-05-18 NOTE — ED Notes (Signed)
 Patient transported to X-ray

## 2023-05-18 NOTE — ED Notes (Signed)
 Pt reports constant emesis since 0200 today 05/18/23.

## 2023-05-18 NOTE — ED Triage Notes (Signed)
 Pt BIB by EMS for N/V/D. Endorses soreness in chest upon waking up this morning that has resolved. EMS reports fever even route using temporal scanner 102.4.  Temperature on arrival 99.8. Decreased appetite and weakness. Recent exposure to flu through grandchildren.

## 2023-05-18 NOTE — ED Provider Triage Note (Signed)
 Emergency Medicine Provider Triage Evaluation Note  Diane Barker , a 73 y.o. female  was evaluated in triage.  Pt complains of n/v/d.  Pt woke up this am with cp.  She took a nitroglycerin which did not help.  Shortly afterwards, she had n/v/d all at the same time.  She did not sleep due to multiple episodes.  She feels dehydrated.  No cp now.  Review of Systems  Positive: Cp/n/v/d Negative: No sob  Physical Exam  BP 134/67   Pulse 93   Temp 99.8 F (37.7 C) (Oral)   Resp 18   SpO2 100%  Gen:   Awake, no distress   Resp:  Normal effort  MSK:   Moves extremities without difficulty  Other:  Cardiac:  RRR  Medical Decision Making  Medically screening exam initiated at 11:33 AM.  Appropriate orders placed.  JODINE MUCHMORE was informed that the remainder of the evaluation will be completed by another provider, this initial triage assessment does not replace that evaluation, and the importance of remaining in the ED until their evaluation is complete.  Ivfs/zofran/labs ordered   Jacalyn Lefevre, MD 05/18/23 1135

## 2023-05-18 NOTE — Treatment Plan (Signed)
 Spoke with the emergency department team.  Briefly, patient is a 73 year old female who presented with fevers with EMS and lower abdominal pain.  CT scan shows possible 1 mm calcification at the right UVJ.  She has no white count, her creatinine is within normal limits and not elevated.  Her urine analysis has small leukocytes and rare bacteria.   In the emergency department, she is hemodynamically stable.  She does have a very mild fever at 100.5, but is nontachycardic, and is normotensive.  Upon personal review of imaging, she has no hydronephrosis on the right side. She also has some left hydronephrosis at the UPJ, however, on delayed imaging, there is no clear obstructive pattern.  I was also able to find a chest abdomen pelvis back from 2015, which shows an identical calcification in the bladder near the right UVJ.  Most likely an incidental finding, do not believe that this is a obstructive ureteral stone.  Urine culture should be drawn, patient can be observed in the emergency department for a few hours.  However, no urgent urologic intervention is indicated at this time.  Would recommend following up final cultures, and patient can schedule an outpatient appointment with [place] if they would like.  Roby Lofts, MD Resident Physician [facility]

## 2023-05-18 NOTE — ED Notes (Signed)
 Pt has urine cup and is aware of need for urine collection.

## 2023-05-18 NOTE — ED Provider Notes (Incomplete)
 [place] EMERGENCY DEPARTMENT AT [facility] Provider Note   CSN: [id number] Arrival date & time: 05/18/23  1005     History {Add pertinent medical, surgical, social history, OB history to HPI:1} Chief Complaint  Patient presents with  . Emesis  . Weakness    KERISHA GOUGHNOUR is a 73 y.o. female 0230 with CP, N/V/D. 7 episodes of NBNB emesis since this morning. CP has had previously, seen Cards for it. Took nitroglycerin at home, didn't help. Better since 0930 however no PO intake since this morning. Thinks CP due to emesis earlier. Hx of PE on DOAC. No LE pain ,swelling. Does not feel like prior PE.  Had some dysuria and lower abdominal pain over the last few days. Few episodes of diarrhea. No blood in stool. Feels like a UTI.  Noted to have fever 102.3 at home and with EMS   No recent illness, Grandchildren had flu last month.      HPI     Home Medications Prior to Admission medications   Medication Sig Start Date End Date Taking? Authorizing Provider  ondansetron (ZOFRAN-ODT) 4 MG disintegrating tablet Take 1 tablet (4 mg total) by mouth every 8 (eight) hours as needed. 05/18/23  Yes Jasslyn Finkel A, PA-C  sulfamethoxazole-trimethoprim (BACTRIM DS) 800-160 MG tablet Take 1 tablet by mouth 2 (two) times daily for 7 days. 05/18/23 05/25/23 Yes Gibran Veselka A, PA-C  acetaminophen (TYLENOL) 500 MG tablet Take 500-1,000 mg by mouth every 6 (six) hours as needed for mild pain.    [provider]  albuterol (VENTOLIN HFA) 108 (90 Base) MCG/ACT inhaler INHALE 2 PUFFS INTO THE LUNGS EVERY 4 HOURS AS NEEDED FOR WHEEZING OR SHORTNESS OF BREATH. 04/06/23   Jetty Duhamel D, MD  amLODipine (NORVASC) 2.5 MG tablet Take 1 tablet (2.5 mg total) by mouth daily. 05/05/23   Parke Poisson, MD  atorvastatin (LIPITOR) 40 MG tablet Take 1 tablet (40 mg total) by mouth daily. 05/05/23   Parke Poisson, MD  dexlansoprazole (DEXILANT) 60 MG capsule Take 60 mg by mouth in  the morning.    [provider]  escitalopram (LEXAPRO) 10 MG tablet Take 10 mg by mouth in the morning.    [provider]  isosorbide mononitrate (IMDUR) 60 MG 24 hr tablet Take 1.5 tablets (90 mg total) by mouth daily. 05/05/23   Parke Poisson, MD  nitroGLYCERIN (NITROSTAT) 0.4 MG SL tablet Place 1 tablet (0.4 mg total) under the tongue every 5 (five) minutes as needed. 03/27/22   Parke Poisson, MD  rivaroxaban (XARELTO) 20 MG TABS tablet Take 1 tablet (20 mg total) by mouth in the morning. 05/04/23   Parke Poisson, MD  Spacer/Aero-Holding Chambers (AEROCHAMBER MV) inhaler Use as instructed 07/30/18   Waymon Budge, MD      Allergies    E.e.s. [erythromycin], Lipitor [atorvastatin], Morphine and codeine, Neurontin [gabapentin], Omnicef [cefdinir], Penicillins, and Relafen [nabumetone]    Review of Systems   Review of Systems  Constitutional: Negative.   HENT: Negative.    Respiratory: Negative.    Cardiovascular:  Positive for chest pain.  Gastrointestinal:  Positive for abdominal pain, diarrhea, nausea and vomiting.  Genitourinary:  Positive for dysuria, frequency and urgency.  Musculoskeletal: Negative.   Skin: Negative.   Neurological: Negative.   All other systems reviewed and are negative.   Physical Exam Updated Vital Signs BP (!) 118/54   Pulse 85   Temp (!) 100.5 F (38.1 C) (  Oral)   Resp 18   SpO2 96%  Physical Exam Vitals and nursing note reviewed.  Constitutional:      General: She is not in acute distress.    Appearance: She is well-developed. She is not ill-appearing, toxic-appearing or diaphoretic.  HENT:     Head: Normocephalic and atraumatic.     Nose: Nose normal.     Mouth/Throat:     Mouth: Mucous membranes are moist.  Eyes:     Pupils: Pupils are equal, round, and reactive to light.  Cardiovascular:     Rate and Rhythm: Normal rate.     Pulses: Normal pulses.     Heart sounds: Normal heart sounds.  Pulmonary:      Effort: Pulmonary effort is normal. No respiratory distress.     Breath sounds: Normal breath sounds.  Abdominal:     General: Bowel sounds are normal. There is no distension.     Palpations: Abdomen is soft. There is no mass.     Tenderness: There is abdominal tenderness. There is no right CVA tenderness, left CVA tenderness, guarding or rebound.     Hernia: No hernia is present.  Musculoskeletal:        General: No swelling, tenderness, deformity or signs of injury. Normal range of motion.     Cervical back: Normal range of motion.     Right lower leg: No edema.     Left lower leg: No edema.  Skin:    General: Skin is warm and dry.  Neurological:     General: No focal deficit present.     Mental Status: She is alert.  Psychiatric:        Mood and Affect: Mood normal.    ED Results / Procedures / Treatments   Labs (all labs ordered are listed, but only abnormal results are displayed) Labs Reviewed  BASIC METABOLIC PANEL - Abnormal; Notable for the following components:      Result Value   Potassium 3.4 (*)    Glucose, Bld 119 (*)    All other components within normal limits  CBC - Abnormal; Notable for the following components:   MCV 100.7 (*)    All other components within normal limits  URINALYSIS, ROUTINE W REFLEX MICROSCOPIC - Abnormal; Notable for the following components:   Leukocytes,Ua SMALL (*)    Bacteria, UA RARE (*)    All other components within normal limits  RESP PANEL BY RT-PCR (RSV, FLU A&B, COVID)  [id number]  GASTROINTESTINAL PANEL BY PCR, STOOL (REPLACES STOOL CULTURE)  C DIFFICILE QUICK SCREEN W PCR REFLEX    URINE CULTURE  HEPATIC FUNCTION PANEL  LIPASE, BLOOD  TROPONIN I (HIGH SENSITIVITY)  TROPONIN I (HIGH SENSITIVITY)    EKG None  Radiology CT ABDOMEN PELVIS W CONTRAST Result Date: 05/18/2023 CLINICAL DATA:  Acute nonlocalized abdominal pain, fever, shortness of breath EXAM: CT ABDOMEN AND PELVIS WITH CONTRAST TECHNIQUE: Multidetector CT  imaging of the abdomen and pelvis was performed using the standard protocol following bolus administration of intravenous contrast. RADIATION DOSE REDUCTION: This exam was performed according to the departmental dose-optimization program which includes automated exposure control, adjustment of the mA and/or kV according to patient size and/or use of iterative reconstruction technique. CONTRAST:  75mL OMNIPAQUE IOHEXOL 350 MG/ML SOLN COMPARISON:  CT abdomen and pelvis 11/13/2013 FINDINGS: Lower chest: Chest is reported separately. Hepatobiliary: No acute abnormality. Pancreas: Fatty atrophy without acute abnormality. Spleen: Unremarkable. Adrenals/Urinary Tract: Normal adrenal glands. Moderate left pelvicaliectasis with abrupt transition point  at the UPJ, slightly progressed from 11/13/2013. This is favored chronic as there is excreted contrast beyond the transition point in the proximal left ureter on delayed imaging. Punctate 1 mm stone at the right UVJ (series 8/image 67). No hydronephrosis on the right. Unremarkable bladder. Stomach/Bowel: Normal caliber large and small bowel. Extensive sigmoid diverticulosis without evidence of diverticulitis. The appendix is not visualized. Small hiatal hernia. Vascular/Lymphatic: Aortic atherosclerosis. Misty mesentery about a few subcentimeter lymph nodes in the central mesentery Reproductive: Uterus and bilateral adnexa are unremarkable. Other: No free intraperitoneal fluid or air. Musculoskeletal: No acute fracture. Bilateral L5 pars defects with grade 1 anterolisthesis of L5. IMPRESSION: 1. Punctate 1 mm stone at the right UVJ. No hydronephrosis on the right. 2. Moderate left pelvicaliectasis with abrupt transition point at the UPJ, slightly progressed from 11/13/2013. 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 On delayed phase, there is excreted contrast beyond the site of stenosis. 3. Extensive sigmoid diverticulosis without evidence of diverticulitis. 4. Misty  mesentery about a few subcentimeter lymph nodes in the central mesentery, nonspecific but can be seen in the setting of mesenteric panniculitis. 5. Chronic L5 spondylolysis with grade 1 spondylolisthesis. 6. Aortic Atherosclerosis (ICD10-I70.0). Electronically Signed   By: Minerva Fester M.D.   On: 05/18/2023 22:28   CT Angio Chest PE W and/or Wo Contrast Result Date: 05/18/2023 CLINICAL DATA:  Nausea and vomiting with chest pain, initial encounter EXAM: CT ANGIOGRAPHY CHEST WITH CONTRAST TECHNIQUE: Multidetector CT imaging of the chest was performed using the standard protocol during bolus administration of intravenous contrast. Multiplanar CT image reconstructions and MIPs were obtained to evaluate the vascular anatomy. RADIATION DOSE REDUCTION: This exam was performed according to the departmental dose-optimization program which includes automated exposure control, adjustment of the mA and/or kV according to patient size and/or use of iterative reconstruction technique. CONTRAST:  75mL OMNIPAQUE IOHEXOL 350 MG/ML SOLN COMPARISON:  Chest x-ray from earlier in the same day. FINDINGS: Cardiovascular: Right-sided thoracic aortic arch is noted. Aberrant left subclavian artery is seen. Atherosclerotic calcifications are noted. No aneurysmal dilatation or dissection is seen. No cardiac enlargement is noted. The pulmonary artery shows a normal branching pattern bilaterally. No intraluminal filling defect to suggest pulmonary embolism is noted. Heavy coronary calcifications are seen. Mediastinum/Nodes: Thoracic inlet is within normal limits. No hilar or mediastinal adenopathy is noted. Small mediastinal lymph nodes are noted likely reactive in nature. The esophagus as visualized shows evidence of a small sliding-type hiatal hernia. Lungs/Pleura: Lungs are well aerated bilaterally. No focal infiltrate or effusion is noted. Mild mosaic attenuation is noted consistent with air trapping. No parenchymal nodule is noted.  Upper Abdomen: Visualized upper abdomen shows no acute abnormality. Musculoskeletal: Degenerative changes of the thoracic spine are seen. No rib abnormality is noted. Review of the MIP images confirms the above findings. IMPRESSION: No evidence of pulmonary emboli. Fall sliding-type hiatal hernia. No other focal abnormality is noted. Electronically Signed   By: Alcide Clever M.D.   On: 05/18/2023 22:21   DG Chest 2 View Result Date: 05/18/2023 CLINICAL DATA:  Chest pain. EXAM: CHEST - 2 VIEW COMPARISON:  Chest radiograph dated Jul 25, 2022. FINDINGS: The heart size and mediastinal contours are unchanged. Right-sided aortic arch again noted. Similar right perihilar scarring. No focal consolidation, pleural effusion, or pneumothorax. Degenerative changes of the thoracic spine. No acute osseous abnormality. IMPRESSION: No acute cardiopulmonary findings. Electronically Signed   By: Hart Robinsons M.D.   On: 05/18/2023 12:19    Procedures Procedures  {  Document cardiac monitor, telemetry assessment procedure when appropriate:1}  Medications Ordered in ED Medications  cefTRIAXone (ROCEPHIN) 1 g in sodium chloride 0.9 % 100 mL IVPB (has no administration in time range)  sodium chloride 0.9 % bolus 1,000 mL (0 mLs Intravenous Stopped 05/18/23 1829)  ondansetron (ZOFRAN) injection 4 mg (4 mg Intravenous Given 05/18/23 1547)  iohexol (OMNIPAQUE) 350 MG/ML injection 75 mL (75 mLs Intravenous Contrast Given 05/18/23 1916)  acetaminophen (TYLENOL) tablet 650 mg (650 mg Oral Given 05/18/23 2220)    ED Course/ Medical Decision Making/ A&P Clinical Course as of 05/18/23 2357  Mon May 18, 2023  2347 With Dr. Dalbert Mayotte with urology.  He has reviewed patient's imaging today.  He is also reviewed her prior imaging.  Imaging appears similar.  He thinks this is likely chronic.  Does not think patient symptoms due to new stone. Ok to FU outpatient per urology [BH]    Clinical Course User Index [BH] Dalia Jollie A, PA-C    73 year old here for evaluation multiple complaints.  Woke up this morning with nausea, vomiting, diarrhea, abdominal pain as well as chest pain.  History of PE and DVT however does not feel similar.  No pain or swelling the legs.  Took a nitro at home however vomited back up.  Feels dehydrated and nauseated, emesis improved earlier.  She is some diffuse pain to her lower abdomen as well as endorses some dysuria as well as frequency.  She thought she has a history of UTI.  Does have history of stones.  Will plan on labs imaging and reassess  Labs and imaging personally viewed and interpreted:  Viral panel negative UA small leuks, rare bacteria CBC without leukocytosis Metabolic panel without significant abnormality X-ray without acute abnormality CT angio chest with hiatal hernia, sliding CT on pelvis with diverticulosis without diverticulitis.  Does mention a 1 mm right UPJ stone   Discussed with Dr. Jennette Dubin with Urology will review imaging given her fever  Discussed again with urology, Dr. Dalbert Mayotte.  He feels imaging findings are similar to what she has had previously and feels like this is likely chronic and not the source of her fever.  Recommend treat for any urine infection and can FU outpatient in office. Does not need admission from urology perspective  Discussed plan with patient.  Will give rocephin in ED.  Patient states she has tolerated this previously.  She has been able to tolerate p.o. intake here.  Plan on dc after abx, attempted to add a urine culture to prior urine however no remaining urine lab.  Will get culture here given symptoms with equivocal urine  {   Click here for ABCD2, HEART and other calculatorsREFRESH Note before signing :1}                              Medical Decision Making Amount and/or Complexity of Data Reviewed Independent Historian: EMS External Data Reviewed: labs, radiology, ECG and notes. Labs: ordered. Decision-making details documented in  ED Course. Radiology: ordered and independent interpretation performed. Decision-making details documented in ED Course. ECG/medicine tests: ordered and independent interpretation performed. Decision-making details documented in ED Course.  Risk OTC drugs. Prescription drug management. Parenteral controlled substances. Decision regarding hospitalization. Diagnosis or treatment significantly limited by social determinants of health.   {Document critical care time when appropriate:1} {Document review of labs and clinical decision tools ie heart score, Chads2Vasc2 etc:1}  {Document your independent review of  radiology images, and any outside records:1} {Document your discussion with family members, caretakers, and with consultants:1} {Document social determinants of health affecting pt's care:1} {Document your decision making why or why not admission, treatments were needed:1} Final Clinical Impression(s) / ED Diagnoses Final diagnoses:  Acute cystitis without hematuria  Nausea vomiting and diarrhea    Rx / DC Orders ED Discharge Orders          Ordered    sulfamethoxazole-trimethoprim (BACTRIM DS) 800-160 MG tablet  2 times daily        05/18/23 2348    ondansetron (ZOFRAN-ODT) 4 MG disintegrating tablet  Every 8 hours PRN        05/18/23 2348

## 2023-05-18 NOTE — Discharge Instructions (Signed)
 It was a pleasure taking care of you here in the ED today.    Given your fever and urinary symptoms you were started on antibiotics for a possible urinary tract infection.    You do have a possible stone in your bladder however the urologist looked at the images and thought this was likely chronic  I have also written for Zofran which is a nausea medication.  Take as needed.  Make sure to follow-up outpatient, return for new or worsening symptoms  Follow-up with urology.  The numbers listed in your discharge instructions

## 2023-05-19 DIAGNOSIS — N3000 Acute cystitis without hematuria: Secondary | ICD-10-CM | POA: Diagnosis not present

## 2023-05-19 NOTE — ED Notes (Signed)
 Patient discharged home. VSS. Patient wheeled out of treatment area with clean and steady gait. All questions answered at time of discharge. Belongings sent home with patient.

## 2023-05-20 LAB — URINE CULTURE

## 2023-05-21 ENCOUNTER — Encounter: Payer: 59 | Admitting: Physician Assistant

## 2023-06-15 ENCOUNTER — Ambulatory Visit (INDEPENDENT_AMBULATORY_CARE_PROVIDER_SITE_OTHER): Admitting: Physician Assistant

## 2023-06-15 ENCOUNTER — Encounter: Payer: Self-pay | Admitting: Physician Assistant

## 2023-06-15 ENCOUNTER — Ambulatory Visit: Payer: Self-pay

## 2023-06-15 DIAGNOSIS — G8929 Other chronic pain: Secondary | ICD-10-CM

## 2023-06-15 DIAGNOSIS — M1712 Unilateral primary osteoarthritis, left knee: Secondary | ICD-10-CM

## 2023-06-15 DIAGNOSIS — M5442 Lumbago with sciatica, left side: Secondary | ICD-10-CM

## 2023-06-15 MED ORDER — METHYLPREDNISOLONE 4 MG PO TABS: ORAL_TABLET | ORAL | 0 refills | Status: DC

## 2023-06-15 NOTE — Progress Notes (Signed)
 Office Visit Note   Patient: Diane Barker           Date of Birth: [birth year]           MRN: [id number] Visit Date: 06/15/2023              Requested by: Creola Corn, MD [address] [place],  [place] [address] PCP: Creola Corn, MD   Assessment & Plan: Visit Diagnoses:  1. Chronic left-sided low back pain with left-sided sciatica   2. Primary osteoarthritis of left knee     Plan:  We will obtain an MRI of her lumbar spine given her radicular symptoms down the left leg weight loss and urinary incontinence.  In regards to treatment at this point in time we will place her on a Medrol Dosepak.  Questions were encouraged and answered at length.  Follow-up after the MRI to go over results discuss further treatment with Dr. Magnus Ivan.  Follow-Up Instructions: Return After MRI.   Orders:  Orders Placed This Encounter  Procedures   XR Lumbar Spine 2-3 Views   MR Lumbar Spine w/o contrast   Meds ordered this encounter  Medications   methylPREDNISolone (MEDROL) 4 MG tablet    Sig: Take as directed    Dispense:  21 tablet    Refill:  0      Procedures: No procedures performed   Clinical Data: No additional findings.   Subjective: Chief Complaint  Patient presents with   Left Knee - Pain   Lower Back - Pain    HPI Mrs. Wimmer returns today wanting her left knee aspirated.  She states the injection on 04/16/2023 which gave her some relief.  She is now having left-sided pain that radiates from her hip down to her ankle area.  Is worse when driving.  She has had no new injuries.  She would like the left knee aspirated she feels it has fluid on the today.  Regards to her back she denies any waking pain.  Denies any saddle anesthesia like symptoms.  She has had bladder incontinence for for the past few months and is due to see urology.  She is also had an unexplained weight loss of 13 pounds over the last 6 months.  She takes Tylenol arthritis without any real relief.  She is  nondiabetic.  Review of Systems Denies any fevers chills or ongoing infections  Objective: Vital Signs: There were no vitals taken for this visit.  Physical Exam Constitutional:      Appearance: She is not ill-appearing or diaphoretic.  Pulmonary:     Effort: Pulmonary effort is normal.  Neurological:     Mental Status: She is alert and oriented to person, place, and time.  Psychiatric:        Mood and Affect: Mood normal.     Ortho Exam Bilateral knees: Good range of motion of both knees no abnormal warmth of either knee.  Left knee slight effusion. Lower extremities 5 out of 5 strength throughout.  Negative straight leg raise bilaterally.  Good range of motion both hips.  She has full forward flexion is able to touch her toes.  Good extension lumbar spine. Specialty Comments:  No specialty comments available.  Imaging: Lumbar spine 2 views: No acute fractures.  Spondylolisthesis grade 1 L5 on S1.  Degenerative disc disease at L1/L2.  No acute findings.  Facet arthritic changes lower lumbar spine.   PMFS History: Patient Active Problem List   Diagnosis Date Noted  Hyperlipidemia 08/01/2020   Asthmatic bronchitis, mild persistent, uncomplicated    Unstable angina (HCC) 01/16/2018   Primary osteoarthritis of both knees 07/29/2017   Cellulitis of right arm 10/07/2016   Sepsis (HCC) 10/07/2016   Chest pain 10/07/2016   Dyspnea on exertion 05/24/2015   DVT, lower extremity (HCC) 05/09/2014   Right calf pain 06/21/2013   Pulmonary embolism (HCC) 06/21/2013   Dysphagia 06/27/2011   Cough 06/27/2011   CHEST PAIN 01/18/2009   RHINITIS 04/13/2007   Obstructive chronic bronchitis with acute bronchitis (HCC) 04/13/2007   GERD 04/13/2007   Depression 01/21/2007   OSTEOARTHRITIS 01/21/2007   LOW BACK PAIN 01/21/2007   BREAST CANCER, HX OF 01/21/2007   Past Medical History:  Diagnosis Date   Anxiety    Arthritis    "all over my body" (01/15/2018)   Breast cancer, right  breast (HCC) 2000   Rt lumpectomy, Chemo/XRT/rt axillary node    Bronchial asthma    Chronic lower back pain    Depression    Diverticulosis    DVT (deep venous thrombosis) (HCC) 06/2013   RLE   GERD (gastroesophageal reflux disease)    High cholesterol    Hx of adenomatous colonic polyps    Hypertension    Obesity    Osteoarthritis    Personal history of chemotherapy    Personal history of radiation therapy    Pulmonary embolism (HCC) 06/2013   "both lungs"   Spondylolisthesis     Family History  Problem Relation Age of Onset   Asthma Mother    COPD Mother    Heart attack Mother    Diabetes Mother    Lung cancer Father    Asthma Daughter    Kidney failure Brother    Diabetes Brother        x 2   Heart attack Brother    Breast cancer Maternal Aunt    Colon cancer Paternal Aunt    Diabetes Daughter        x 2   Hypertension Brother     Past Surgical History:  Procedure Laterality Date   BREAST BIOPSY Right 2000   BREAST LUMPECTOMY Right 2000   for CA txed w/ chemo and radiation, right   CARDIAC CATHETERIZATION  03/21/2020   CORONARY ATHERECTOMY N/A 04/03/2022   Procedure: CORONARY ATHERECTOMY;  Surgeon: Swaziland, Peter M, MD;  Location: [facility] INVASIVE CV LAB;  Service: Cardiovascular;  Laterality: N/A;   CORONARY IMAGING/OCT N/A 04/03/2022   Procedure: INTRAVASCULAR IMAGING/OCT;  Surgeon: Swaziland, Peter M, MD;  Location: [facility] INVASIVE CV LAB;  Service: Cardiovascular;  Laterality: N/A;   CORONARY PRESSURE/FFR STUDY N/A 01/18/2018   Procedure: INTRAVASCULAR PRESSURE WIRE/FFR STUDY;  Surgeon: Kathleene Hazel, MD;  Location: [facility] INVASIVE CV LAB;  Service: Cardiovascular;  Laterality: N/A;   CORONARY PRESSURE/FFR STUDY N/A 04/03/2022   Procedure: INTRAVASCULAR PRESSURE WIRE/FFR STUDY;  Surgeon: Swaziland, Peter M, MD;  Location: [facility] INVASIVE CV LAB;  Service: Cardiovascular;  Laterality: N/A;   CORONARY STENT INTERVENTION N/A 04/03/2022   Procedure: CORONARY STENT INTERVENTION;   Surgeon: Swaziland, Peter M, MD;  Location: [facility] INVASIVE CV LAB;  Service: Cardiovascular;  Laterality: N/A;   HERNIA REPAIR     LEFT HEART CATH AND CORONARY ANGIOGRAPHY N/A 01/18/2018   Procedure: LEFT HEART CATH AND CORONARY ANGIOGRAPHY;  Surgeon: Kathleene Hazel, MD;  Location: [facility] INVASIVE CV LAB;  Service: Cardiovascular;  Laterality: N/A;   LEFT HEART CATH AND CORONARY ANGIOGRAPHY N/A 03/21/2020   Procedure: LEFT HEART  CATH AND CORONARY ANGIOGRAPHY;  Surgeon: Iran Ouch, MD;  Location: [facility] INVASIVE CV LAB;  Service: Cardiovascular;  Laterality: N/A;   LEFT HEART CATH AND CORONARY ANGIOGRAPHY N/A 04/03/2022   Procedure: LEFT HEART CATH AND CORONARY ANGIOGRAPHY;  Surgeon: Swaziland, Peter M, MD;  Location: [facility] INVASIVE CV LAB;  Service: Cardiovascular;  Laterality: N/A;   TUBAL LIGATION     UMBILICAL HERNIA REPAIR  1983   Social History   Occupational History   Occupation: Personnel officer    Comment: retired    Associate Professor:  [employer]  Tobacco Use   Smoking status: Never   Smokeless tobacco: Never  Vaping Use   Vaping status: Never Used  Substance and Sexual Activity   Alcohol use: Never    Alcohol/week: 0.0 standard drinks of alcohol   Drug use: Never   Sexual activity: Not Currently

## 2023-06-18 ENCOUNTER — Ambulatory Visit
Admission: RE | Admit: 2023-06-18 | Discharge: 2023-06-18 | Disposition: A | Discharge status: Home / Self Care | Source: Ambulatory Visit | Attending: Physician Assistant | Admitting: Physician Assistant

## 2023-06-18 DIAGNOSIS — G8929 Other chronic pain: Secondary | ICD-10-CM

## 2023-06-30 ENCOUNTER — Other Ambulatory Visit

## 2023-07-08 ENCOUNTER — Encounter: Payer: Self-pay | Admitting: Orthopaedic Surgery

## 2023-07-08 ENCOUNTER — Ambulatory Visit (INDEPENDENT_AMBULATORY_CARE_PROVIDER_SITE_OTHER): Admitting: Orthopaedic Surgery

## 2023-07-08 DIAGNOSIS — M5442 Lumbago with sciatica, left side: Secondary | ICD-10-CM

## 2023-07-08 DIAGNOSIS — M1712 Unilateral primary osteoarthritis, left knee: Secondary | ICD-10-CM | POA: Diagnosis not present

## 2023-07-08 DIAGNOSIS — G8929 Other chronic pain: Secondary | ICD-10-CM

## 2023-07-08 NOTE — Progress Notes (Signed)
 The patient is very well-known to us .  She is 73 years old and has debilitating arthritis involving both of her knees but is much worse on the left than the right.  At this point she does wish to consider knee replacement surgery.  She has had gel injections/hyaluronic acid injections as well as steroid injections.  She has been on weight loss journey and continues to lose weight.  We did recently obtain MRI of her lumbar spine because she was having such sciatic symptoms going down her left leg with weakness and was complaining of some bowel and bladder function changes.  That is no longer present but she still having the radicular symptoms going down the left leg.  The MRI of her lumbar spine shows a spondylolisthesis at L5-S1 but there is no foraminal stenosis and no central stenosis.  There is no nerve compression fortunately.  Previous x-rays of her left knee show tricompartment arthritis with bone-on-bone wear of all 3 compartments and slight varus malalignment.  Examination of her left knee today shows severe pain throughout the arc of motion of her knee with medial and lateral tenderness and patellofemoral tenderness.  This point we did discuss knee replacement surgery.  I showed her knee replacement model and discussed the risk and benefits of surgery and what to expect from an intraoperative and postoperative standpoint.  Given the failure of all forms of conservative treatment for over a year now, I think this is the next step for her given her continued severe pain and decreased mobility.  At this point her pain is daily with her left knee and it is detrimentally affecting her mobility, her quality of life and actives daily living to the point she does wish to proceed with knee replacement surgery.  She has our surgery scheduler's card.  She will give us  a call if she decides to have this scheduled.  She is on Xarelto  and will need to stop this 3 full days prior to surgery.

## 2023-07-28 ENCOUNTER — Telehealth: Payer: Self-pay | Admitting: Orthopaedic Surgery

## 2023-07-28 ENCOUNTER — Telehealth: Payer: Self-pay | Admitting: Internal Medicine

## 2023-07-28 NOTE — Telephone Encounter (Signed)
 Patient called and said that we are supposed to sign this form telling the cardiologist what kind of medication she will be using during surgery. CB#[phone]

## 2023-07-28 NOTE — Telephone Encounter (Signed)
 Patient calling in to speak with the nurse about her upcoming surgery. Please advise

## 2023-07-28 NOTE — Telephone Encounter (Signed)
 Called pt she states she is having surgery on 5/27 and was told to let all her doctors know. Pt informed we need a surgical clearance form. She will call the office to have them fax it to us .

## 2023-07-29 ENCOUNTER — Telehealth: Payer: Self-pay

## 2023-07-29 NOTE — Progress Notes (Unsigned)
 Patient ID: Diane Barker, female    DOB: 05-May-1950, 73 y.o.   MRN: [id number]  HPI F never smoker followed for bronchitis with hx rhinitis, GERD, hx breast cancer, Hx DVT/ PE/ Xarelto . Office Spirometry 04/30/2015- WNL- FVC 2.75/87%, FEV1 2.29/93%, FEV1/FVC 0.83, FEF 25-75 percent 2.83/126%. PFT 01/08/2018- insignificant response to dilator, mild restriction, diffusion mildly reduced.  FVC 2.52/78%, FEV1 2.21/90%, ratio 1.88, FEF 25-75% 3.30/259%, TLC 75%, DLCO 63% CPET 10/14/21- Conclusion: Exercise testing with gas exchange demonstrates normal functional capacity when compared to matched sedentary norms. There is no indication for cardiopulmonary abnormality. Patient appears primarily ventilatory limited due to body habitus.  ECHO 11/19/21- WNL ------------------------------------------------------------------------------   01/29/23- 73 year old female never smoker followed for Bronchitis, Rhinitis, complicated by GERD, hx DVT/PE 2015,  history Breast Cancer/XRT, history DVT/PE/Xarelto , CAD/ stents, Covid infection November 2020,  -Ventolin  hfa, Neb Duoneb, She had reported Breztri  was too strong for her. -----Breathing has been good  Satisfied with her albuterol , used occasionally. Not wheezing. For 2 weeks has had tight ache in L calf. No chest pain or systemic issues. Had DVT/PE in 2015. Remains on Xarelto  but has concern possible recurrence. Discussed D-dimer/ dopplers. Much family stress. Daughter and grand children living w her. Agrees to referral to [facility]. She asks about getting a PCP in [facility] system so records can share. CXR 07/30/22-  IMPRESSION: No active cardiopulmonary disease.  07/30/23- 73 year old female never smoker followed for Bronchitis, Rhinitis, complicated by GERD, hx DVT/PE 2015,  history Breast Cancer/XRT, history DVT/PE/Xarelto , CAD/ stents, Covid infection November 2020,  -Ventolin  hfa, Neb Duoneb, She had reported Breztri  was too strong for her. We had  referred to [facility] for stress/ depression- didn't go?? Pending knee replacement CTa PE chest 05/18/23 IMPRESSION: No evidence of pulmonary emboli. Fall sliding-type hiatal hernia. No other focal abnormality is noted. Discussed the use of AI scribe software for clinical note transcription with the patient, who gave verbal consent to proceed.  History of Present Illness   Diane Barker is a 73 year old female who presents for follow-up of asthma.  Her breathing is well-controlled despite a heavy pollen season. She uses an albuterol  inhaler and a nebulizer machine at home. No breathing difficulties are present, and no refills are needed at this time.   She is pending left total knee replacement soon. No obvious pulmonary red-flags at this time.     Assessment and Plan:   Asthma moderate persistent uncomplicated Use of albuterol  inhaler and nebulizer Respiratory symptoms well-controlled with albuterol . No recent exacerbations. - Continue albuterol  inhaler and nebulizer as needed.  Left knee pain Chronic pain requires surgical intervention. Discussed skilled nursing facility for post-operative recovery. - Discuss post-operative care options with surgical team, including potential stay at a skilled nursing facility.          Review of Systems-See HPI   + = positive Constitutional:   No-   weight loss, night sweats, fevers, chills, fatigue, lassitude. HEENT:   +  headaches, No-difficulty swallowing, tooth/dental problems, +sore throat,        sneezing, itching, ear ache, nasal congestion, +post nasal drip,  CV:  +atypical chest pain, orthopnea, PND, swelling in lower extremities, anasarca,  dizziness, palpitations Resp: +shortness of breath with exertion or at rest.              + productive cough,  + non-productive cough,  No- coughing up of blood.           change in color  of mucus.  No- wheezing.   Skin: No-   rash or lesions. GI:  +  heartburn, indigestion, abdominal  pain, +nausea, vomiting,  GU: MS:  + L calf pain Neuro-     nothing unusual Psych:  No- change in mood or affect. No depression or anxiety.  No memory loss.  Objective:  OBJ- Physical Exam General- Alert, Oriented, Affect-+episodic crying, then calm, Distress- none acute, + obese Skin- rash-none, lesions- none, excoriation+  Lymphadenopathy- none Head- atraumatic            Eyes- Gross vision intact, PERRLA, conjunctivae and secretions clear            Ears- Hearing, canals-normal            Nose- clear, no-Septal dev, mucus, polyps, erosion, perforation             Throat- Mallampati IV , mucosa clear , drainage- none, tonsils- atrophic,  + Upper plate and many missing teeth Neck- flexible , trachea midline, no stridor , thyroid  nl, carotid no bruit Chest - symmetrical excursion , unlabored           Heart/CV- RRR , no murmur , no gallop  , no rub, nl s1 s2                           - JVD- none , edema-none, stasis changes- none, varices- none           Lung-  unlabored, wheeze- none, cough-none, dullness-none, rub- none           Chest wall-  Abd-  Br/ Gen/ Rectal- Not done, not indicated Extrem- Neg Homan's L, No edema. Neuro- grossly intact to observation

## 2023-07-29 NOTE — Telephone Encounter (Signed)
   Pre-operative Risk Assessment    Patient Name: Diane Barker  DOB: [birth year] MRN: [id number]   Date of last office visit: 05/14/23 Grady Lawman, MD Date of next office visit: 11/12/23 Grady Lawman, MD   Request for Surgical Clearance    Procedure:  LEFT TOTAL KNEE ARTHROPLASTY  Date of Surgery:  Clearance 08/11/23                                Surgeon:  Norberto Bear, MD Surgeon's Group or Practice Name:  [facility] Phone number:  [phone] Fax number:  [phone]  ATTN: SHERRIE   Type of Clearance Requested:   - Medical  - Pharmacy:  Hold Rivaroxaban  (Xarelto ) 3 DAYS PRE-OP    Type of Anesthesia:  Not Indicated   Additional requests/questions:    SignedCollin Deal   07/29/2023, 4:22 PM

## 2023-07-29 NOTE — Telephone Encounter (Signed)
 Faxed clearance request.  Spoke with patient and advised.

## 2023-07-29 NOTE — Progress Notes (Signed)
 Surgical Instructions   Your procedure is scheduled on Tuesday, May 27th, 2025. Report to [name] Main Entrance "A" at 9:15 A.M., then check in with the Admitting office. Any questions or running late day of surgery: call [phone]  Questions prior to your surgery date: call [phone], Monday-Friday, 8am-4pm. If you experience any cold or flu symptoms such as cough, fever, chills, shortness of breath, etc. between now and your scheduled surgery, please notify us  at the above number.     Remember:  Do not eat after midnight the night before your surgery  You may drink clear liquids until 8:15 the morning of your surgery.   Clear liquids allowed are: Water, Non-Citrus Juices (without pulp), Carbonated Beverages, Clear Tea (no milk, honey, etc.), Black Coffee Only (NO MILK, CREAM OR POWDERED CREAMER of any kind), and Gatorade.    Take these medicines the morning of surgery with A SIP OF WATER: Amlodipine  (Norvasc ) Atorvastatin  (Lipitor) Dexlansoprazole  (Dexilant ) Escitalopram  (Lexapro ) Isosorbide  Mononitrate (IMDUR )    May take these medicines IF NEEDED: Acetaminophen  (Tylenol ) Albuterol  (Ventolin  HF) inhaler - please bring with you on day of surgery. Nitroglycerin  (Nitrostat )     One week prior to surgery, STOP taking any Aspirin  (unless otherwise instructed by your surgeon) Aleve, Naproxen, Ibuprofen , Motrin , Advil , Goody's, BC's, all herbal medications, fish oil, and non-prescription vitamins.  Follow your surgeon's instructions on when to stop your Rivaroxaban  (Xarelto ).  If no instructions received, please reach out to your surgeon's office ASAP.                       Do NOT Smoke (Tobacco/Vaping) for 24 hours prior to your procedure.  If you use a CPAP at night, you may bring your mask/headgear for your overnight stay.   You will be asked to remove any contacts, glasses, piercing's, hearing aid's, dentures/partials prior to surgery. Please bring cases for these  items if needed.    Patients discharged the day of surgery will not be allowed to drive home, and someone needs to stay with them for 24 hours.  SURGICAL WAITING ROOM VISITATION Patients may have no more than 2 support people in the waiting area - these visitors may rotate.   Pre-op nurse will coordinate an appropriate time for 1 ADULT support person, who may not rotate, to accompany patient in pre-op.  Children under the age of 6 must have an adult with them who is not the patient and must remain in the main waiting area with an adult.  If the patient needs to stay at the hospital during part of their recovery, the visitor guidelines for inpatient rooms apply.  Please refer to the [facility] website for the visitor guidelines for any additional information.   If you received a COVID test during your pre-op visit  it is requested that you wear a mask when out in public, stay away from anyone that may not be feeling well and notify your surgeon if you develop symptoms. If you have been in contact with anyone that has tested positive in the last 10 days please notify you surgeon.      Pre-operative 5 CHG Bathing Instructions   You can play a key role in reducing the risk of infection after surgery. Your skin needs to be as free of germs as possible. You can reduce the number of germs on your skin by washing with CHG (chlorhexidine gluconate) soap before surgery. CHG is an antiseptic soap that kills germs and continues  to kill germs even after washing.   DO NOT use if you have an allergy to chlorhexidine/CHG or antibacterial soaps. If your skin becomes reddened or irritated, stop using the CHG and notify one of our RNs at [phone].   Please shower with the CHG soap starting 4 days before surgery using the following schedule:     Please keep in mind the following:  DO NOT shave, including legs and underarms, starting the day of your first shower.   You may shave your face at any  point before/day of surgery.  Place clean sheets on your bed the day you start using CHG soap. Use a clean washcloth (not used since being washed) for each shower. DO NOT sleep with pets once you start using the CHG.   CHG Shower Instructions:  Wash your face and private area with normal soap. If you choose to wash your hair, wash first with your normal shampoo.  After you use shampoo/soap, rinse your hair and body thoroughly to remove shampoo/soap residue.  Turn the water OFF and apply about 3 tablespoons (45 ml) of CHG soap to a CLEAN washcloth.  Apply CHG soap ONLY FROM YOUR NECK DOWN TO YOUR TOES (washing for 3-5 minutes)  DO NOT use CHG soap on face, private areas, open wounds, or sores.  Pay special attention to the area where your surgery is being performed.  If you are having back surgery, having someone wash your back for you may be helpful. Wait 2 minutes after CHG soap is applied, then you may rinse off the CHG soap.  Pat dry with a clean towel  Put on clean clothes/pajamas   If you choose to wear lotion, please use ONLY the CHG-compatible lotions that are listed below.  Additional instructions for the day of surgery: DO NOT APPLY any lotions, deodorants, cologne, or perfumes.   Do not bring valuables to the hospital. [facility] is not responsible for any belongings/valuables. Do not wear nail polish, gel polish, artificial nails, or any other type of covering on natural nails (fingers and toes) Do not wear jewelry or makeup Put on clean/comfortable clothes.  Please brush your teeth.  Ask your nurse before applying any prescription medications to the skin.     CHG Compatible Lotions   Aveeno Moisturizing lotion  Cetaphil Moisturizing Cream  Cetaphil Moisturizing Lotion  Clairol Herbal Essence Moisturizing Lotion, Dry Skin  Clairol Herbal Essence Moisturizing Lotion, Extra Dry Skin  Clairol Herbal Essence Moisturizing Lotion, Normal Skin  Curel Age Defying Therapeutic  Moisturizing Lotion with Alpha Hydroxy  Curel Extreme Care Body Lotion  Curel Soothing Hands Moisturizing Hand Lotion  Curel Therapeutic Moisturizing Cream, Fragrance-Free  Curel Therapeutic Moisturizing Lotion, Fragrance-Free  Curel Therapeutic Moisturizing Lotion, Original Formula  Eucerin Daily Replenishing Lotion  Eucerin Dry Skin Therapy Plus Alpha Hydroxy Crme  Eucerin Dry Skin Therapy Plus Alpha Hydroxy Lotion  Eucerin Original Crme  Eucerin Original Lotion  Eucerin Plus Crme Eucerin Plus Lotion  Eucerin TriLipid Replenishing Lotion  Keri Anti-Bacterial Hand Lotion  Keri Deep Conditioning Original Lotion Dry Skin Formula Softly Scented  Keri Deep Conditioning Original Lotion, Fragrance Free Sensitive Skin Formula  Keri Lotion Fast Absorbing Fragrance Free Sensitive Skin Formula  Keri Lotion Fast Absorbing Softly Scented Dry Skin Formula  Keri Original Lotion  Keri Skin Renewal Lotion Keri Silky Smooth Lotion  Keri Silky Smooth Sensitive Skin Lotion  Nivea Body Creamy Conditioning Oil  Nivea Body Extra Enriched Insurance claims handler  Body Sheer Moisturizing Lotion Nivea Crme  Nivea Skin Firming Lotion  NutraDerm 30 Skin Lotion  NutraDerm Skin Lotion  NutraDerm Therapeutic Skin Cream  NutraDerm Therapeutic Skin Lotion  ProShield Protective Hand Cream  Provon moisturizing lotion  Please read over the following fact sheets that you were given.

## 2023-07-30 ENCOUNTER — Telehealth: Payer: Self-pay | Admitting: *Deleted

## 2023-07-30 ENCOUNTER — Other Ambulatory Visit: Payer: Self-pay

## 2023-07-30 ENCOUNTER — Encounter: Payer: Self-pay | Admitting: Internal Medicine

## 2023-07-30 ENCOUNTER — Encounter (HOSPITAL_COMMUNITY): Payer: Self-pay

## 2023-07-30 ENCOUNTER — Encounter (HOSPITAL_COMMUNITY)
Admission: RE | Admit: 2023-07-30 | Discharge: 2023-07-30 | Disposition: A | Discharge status: Home / Self Care | Source: Ambulatory Visit | Attending: Orthopaedic Surgery | Admitting: Orthopaedic Surgery

## 2023-07-30 ENCOUNTER — Ambulatory Visit (INDEPENDENT_AMBULATORY_CARE_PROVIDER_SITE_OTHER): Payer: 59 | Admitting: Internal Medicine

## 2023-07-30 VITALS — BP 110/68 | HR 64 | Ht 65.0 in | Wt 233.5 lb

## 2023-07-30 VITALS — BP 110/66 | HR 59 | Temp 98.1°F | Resp 18 | Ht 65.0 in | Wt 230.9 lb

## 2023-07-30 DIAGNOSIS — I2510 Atherosclerotic heart disease of native coronary artery without angina pectoris: Secondary | ICD-10-CM | POA: Diagnosis not present

## 2023-07-30 DIAGNOSIS — M1712 Unilateral primary osteoarthritis, left knee: Secondary | ICD-10-CM | POA: Diagnosis not present

## 2023-07-30 DIAGNOSIS — Z01818 Encounter for other preprocedural examination: Secondary | ICD-10-CM

## 2023-07-30 DIAGNOSIS — G8929 Other chronic pain: Secondary | ICD-10-CM | POA: Insufficient documentation

## 2023-07-30 DIAGNOSIS — M25562 Pain in left knee: Secondary | ICD-10-CM | POA: Diagnosis not present

## 2023-07-30 DIAGNOSIS — M545 Low back pain, unspecified: Secondary | ICD-10-CM | POA: Insufficient documentation

## 2023-07-30 DIAGNOSIS — Z01812 Encounter for preprocedural laboratory examination: Secondary | ICD-10-CM | POA: Insufficient documentation

## 2023-07-30 DIAGNOSIS — Z9221 Personal history of antineoplastic chemotherapy: Secondary | ICD-10-CM | POA: Insufficient documentation

## 2023-07-30 DIAGNOSIS — J45909 Unspecified asthma, uncomplicated: Secondary | ICD-10-CM | POA: Diagnosis not present

## 2023-07-30 DIAGNOSIS — Z923 Personal history of irradiation: Secondary | ICD-10-CM | POA: Diagnosis not present

## 2023-07-30 DIAGNOSIS — I10 Essential (primary) hypertension: Secondary | ICD-10-CM | POA: Diagnosis not present

## 2023-07-30 DIAGNOSIS — Z853 Personal history of malignant neoplasm of breast: Secondary | ICD-10-CM | POA: Diagnosis not present

## 2023-07-30 DIAGNOSIS — J4530 Mild persistent asthma, uncomplicated: Secondary | ICD-10-CM

## 2023-07-30 DIAGNOSIS — E669 Obesity, unspecified: Secondary | ICD-10-CM | POA: Diagnosis not present

## 2023-07-30 DIAGNOSIS — J4540 Moderate persistent asthma, uncomplicated: Secondary | ICD-10-CM | POA: Diagnosis not present

## 2023-07-30 LAB — BASIC METABOLIC PANEL WITH GFR
Anion gap: 3 — ABNORMAL LOW (ref 5–15)
BUN: 14 mg/dL (ref 8–23)
CO2: 28 mmol/L (ref 22–32)
Calcium: 8.9 mg/dL (ref 8.9–10.3)
Chloride: 107 mmol/L (ref 98–111)
Creatinine, Ser: 0.88 mg/dL (ref 0.44–1.00)
GFR, Estimated: >60 mL/min (ref >60)
Glucose, Bld: 100 mg/dL — ABNORMAL HIGH (ref 70–99)
Potassium: 4.4 mmol/L (ref 3.5–5.1)
Sodium: 138 mmol/L (ref 135–145)

## 2023-07-30 LAB — CBC
HCT: 36.2 % (ref 36.0–46.0)
Hemoglobin: 11.8 g/dL — ABNORMAL LOW (ref 12.0–15.0)
MCH: 33.4 pg (ref 26.0–34.0)
MCHC: 32.6 g/dL (ref 30.0–36.0)
MCV: 102.5 fL — ABNORMAL HIGH (ref 80.0–100.0)
Platelets: 207 10*3/uL (ref 150–400)
RBC: 3.53 MIL/uL — ABNORMAL LOW (ref 3.87–5.11)
RDW: 12.9 % (ref 11.5–15.5)
WBC: 4.3 10*3/uL (ref 4.0–10.5)
nRBC: 0 % (ref 0.0–0.2)

## 2023-07-30 LAB — SURGICAL PCR SCREEN
MRSA, PCR: NEGATIVE
Staphylococcus aureus: POSITIVE — AB

## 2023-07-30 NOTE — Telephone Encounter (Signed)
   Name: Diane Barker  DOB: 17-Apr-1950  MRN: [id number]  Primary Cardiologist: Euell Herrlich, MD   Preoperative team, please contact this patient and set up a phone call appointment for further preoperative risk assessment. Please obtain consent and complete medication review. Thank you for your help.  I confirm that guidance regarding antiplatelet and oral anticoagulation therapy has been completed and, if necessary, noted below.  Patient with diagnosis of DVT on Xarelto  for anticoagulation.     Procedure: LEFT TOTAL KNEE ARTHROPLASTY  Date of procedure: 08/11/23     CrCl 75 ml/min Platelet count 238   Pt w/ DVT in 2015   Per office protocol, patient can hold Xarelto  for 3 days prior to procedure.    I also confirmed the patient resides in the state of [place] . As per [facility] [employer] telemedicine laws, the patient must reside in the state in which the provider is licensed.   Ava Boatman, NP 07/30/2023, 12:43 PM [facility]

## 2023-07-30 NOTE — Telephone Encounter (Signed)
 Patient with diagnosis of DVT on Xarelto  for anticoagulation.    Procedure: LEFT TOTAL KNEE ARTHROPLASTY  Date of procedure: 08/11/23   CrCl 75 ml/min Platelet count 238  Pt w/ DVT in 2015  Per office protocol, patient can hold Xarelto  for 3 days prior to procedure.    **This guidance is not considered finalized until pre-operative APP has relayed final recommendations.**

## 2023-07-30 NOTE — Progress Notes (Addendum)
 Surgical Instructions   Your procedure is scheduled on Tuesday, May 27th, 2025. Report to [facility] Main Entrance "A" at 9:15 A.M., then check in with the Admitting office. Any questions or running late day of surgery: call [phone]  Questions prior to your surgery date: call [phone], Monday-Friday, 8am-4pm. If you experience any cold or flu symptoms such as cough, fever, chills, shortness of breath, etc. between now and your scheduled surgery, please notify us  at the above number.     Remember:  Do not eat after midnight the night before your surgery  You may drink clear liquids until 8:15 the morning of your surgery.   Clear liquids allowed are: Water, Non-Citrus Juices (without pulp), Carbonated Beverages, Clear Tea (no milk, honey, etc.), Black Coffee Only (NO MILK, CREAM OR POWDERED CREAMER of any kind), and Gatorade.  Patient Instructions  The night before surgery:  No food after midnight. ONLY clear liquids after midnight  The day of surgery (if you do NOT have diabetes):  Drink ONE (1) Pre-Surgery Clear Ensure by 8:15 the morning of surgery. Drink in one sitting. Do not sip.  This drink was given to you during your hospital  pre-op appointment visit.  Nothing else to drink after completing the  Pre-Surgery Clear Ensure.         If you have questions, please contact your surgeon's office.  Take these medicines the morning of surgery with A SIP OF WATER: Amlodipine  (Norvasc ) Atorvastatin  (Lipitor) Dexlansoprazole  (Dexilant ) Escitalopram  (Lexapro ) Isosorbide  Mononitrate (IMDUR )    May take these medicines IF NEEDED: Acetaminophen  (Tylenol ) Albuterol  (Ventolin  HF) inhaler - please bring with you on day of surgery. Nitroglycerin  (Nitrostat )   PER YOUR CARDIOLOGIST'S INSTRUCTIONS HOLD YOUR Rivaroxaban  (Xarelto ) FOR 3 DAYS PRIOR TO SURGERY, WITH THE LAST DOSE BEING 5/37/2025.    One week prior to surgery, STOP taking any Aspirin  (unless otherwise instructed  by your surgeon) Aleve, Naproxen, Ibuprofen , Motrin , Advil , Goody's, BC's, all herbal medications, fish oil, and non-prescription vitamins.       Do NOT Smoke (Tobacco/Vaping) for 24 hours prior to your procedure.  If you use a CPAP at night, you may bring your mask/headgear for your overnight stay.   You will be asked to remove any contacts, glasses, piercing's, hearing aid's, dentures/partials prior to surgery. Please bring cases for these items if needed.    Patients discharged the day of surgery will not be allowed to drive home, and someone needs to stay with them for 24 hours.  SURGICAL WAITING ROOM VISITATION Patients may have no more than 2 support people in the waiting area - these visitors may rotate.   Pre-op nurse will coordinate an appropriate time for 1 ADULT support person, who may not rotate, to accompany patient in pre-op.  Children under the age of 57 must have an adult with them who is not the patient and must remain in the main waiting area with an adult.  If the patient needs to stay at the hospital during part of their recovery, the visitor guidelines for inpatient rooms apply.  Please refer to the [facility] website for the visitor guidelines for any additional information.   If you received a COVID test during your pre-op visit  it is requested that you wear a mask when out in public, stay away from anyone that may not be feeling well and notify your surgeon if you develop symptoms. If you have been in contact with anyone that has tested positive in the last 10 days  please notify you surgeon.      Pre-operative 5 CHG Bathing Instructions   You can play a key role in reducing the risk of infection after surgery. Your skin needs to be as free of germs as possible. You can reduce the number of germs on your skin by washing with CHG (chlorhexidine gluconate) soap before surgery. CHG is an antiseptic soap that kills germs and continues to kill germs even after washing.    DO NOT use if you have an allergy to chlorhexidine/CHG or antibacterial soaps. If your skin becomes reddened or irritated, stop using the CHG and notify one of our RNs at [phone].   Please shower with the CHG soap starting 4 days before surgery using the following schedule:     Please keep in mind the following:  DO NOT shave, including legs and underarms, starting the day of your first shower.   You may shave your face at any point before/day of surgery.  Place clean sheets on your bed the day you start using CHG soap. Use a clean washcloth (not used since being washed) for each shower. DO NOT sleep with pets once you start using the CHG.   CHG Shower Instructions:  Wash your face and private area with normal soap. If you choose to wash your hair, wash first with your normal shampoo.  After you use shampoo/soap, rinse your hair and body thoroughly to remove shampoo/soap residue.  Turn the water OFF and apply about 3 tablespoons (45 ml) of CHG soap to a CLEAN washcloth.  Apply CHG soap ONLY FROM YOUR NECK DOWN TO YOUR TOES (washing for 3-5 minutes)  DO NOT use CHG soap on face, private areas, open wounds, or sores.  Pay special attention to the area where your surgery is being performed.  If you are having back surgery, having someone wash your back for you may be helpful. Wait 2 minutes after CHG soap is applied, then you may rinse off the CHG soap.  Pat dry with a clean towel  Put on clean clothes/pajamas   If you choose to wear lotion, please use ONLY the CHG-compatible lotions that are listed below.  Additional instructions for the day of surgery: DO NOT APPLY any lotions, deodorants, cologne, or perfumes.   Do not bring valuables to the hospital. [facility] is not responsible for any belongings/valuables. Do not wear nail polish, gel polish, artificial nails, or any other type of covering on natural nails (fingers and toes) Do not wear jewelry or makeup Put on  clean/comfortable clothes.  Please brush your teeth.  Ask your nurse before applying any prescription medications to the skin.     CHG Compatible Lotions   Aveeno Moisturizing lotion  Cetaphil Moisturizing Cream  Cetaphil Moisturizing Lotion  Clairol Herbal Essence Moisturizing Lotion, Dry Skin  Clairol Herbal Essence Moisturizing Lotion, Extra Dry Skin  Clairol Herbal Essence Moisturizing Lotion, Normal Skin  Curel Age Defying Therapeutic Moisturizing Lotion with Alpha Hydroxy  Curel Extreme Care Body Lotion  Curel Soothing Hands Moisturizing Hand Lotion  Curel Therapeutic Moisturizing Cream, Fragrance-Free  Curel Therapeutic Moisturizing Lotion, Fragrance-Free  Curel Therapeutic Moisturizing Lotion, Original Formula  Eucerin Daily Replenishing Lotion  Eucerin Dry Skin Therapy Plus Alpha Hydroxy Crme  Eucerin Dry Skin Therapy Plus Alpha Hydroxy Lotion  Eucerin Original Crme  Eucerin Original Lotion  Eucerin Plus Crme Eucerin Plus Lotion  Eucerin TriLipid Replenishing Lotion  Keri Anti-Bacterial Hand Lotion  Keri Deep Conditioning Original Lotion Dry Skin Formula Softly Scented  Keri Deep Conditioning Original Lotion, Fragrance Free Sensitive Skin Formula  Keri Lotion Fast Absorbing Fragrance Free Sensitive Skin Formula  Keri Lotion Fast Absorbing Softly Scented Dry Skin Formula  Keri Original Lotion  Keri Skin Renewal Lotion Keri Silky Smooth Lotion  Keri Silky Smooth Sensitive Skin Lotion  Nivea Body Creamy Conditioning Oil  Nivea Body Extra Enriched Lotion  Nivea Body Original Lotion  Nivea Body Sheer Moisturizing Lotion Nivea Crme  Nivea Skin Firming Lotion  NutraDerm 30 Skin Lotion  NutraDerm Skin Lotion  NutraDerm Therapeutic Skin Cream  NutraDerm Therapeutic Skin Lotion  ProShield Protective Hand Cream  Provon moisturizing lotion  Please read over the following fact sheets that you were given.

## 2023-07-30 NOTE — Progress Notes (Addendum)
 PCP - Dr. Margarete Sharps Cardiologist - Dr. Grady Lawman, LOV 05/14/2023.  Clearance re: xaralto 07/29/2023  PPM/ICD - denies Device Orders - na Rep Notified - na  Chest x-ray - 05/18/2023 EKG - 05/18/2023 Stress Test -  ECHO - [id number]/52023 Cardiac Cath - 04/03/2022  Sleep Study -  CPAP -   Non-diabetic  Blood Thinner Instructions: Diane Barker, per cardiologist, hold 3 days, last dost 08/07/2023 Aspirin  Instructions:  ERAS Protcol -Ensure until 0815  Anesthesia review: Yes.  HTN, xarelto   Patient denies shortness of breath, fever, cough and chest pain at PAT appointment   All instructions explained to the patient, with a verbal understanding of the material. Patient agrees to go over the instructions while at home for a better understanding. Patient also instructed to self quarantine after being tested for COVID-19. The opportunity to ask questions was provided.

## 2023-07-30 NOTE — Telephone Encounter (Signed)
 Pt has been scheduled tele preop appt 08/06/23. Consent has been done. Med rec to be done at the hospital now as the pt is there for her preop.      Patient Consent for Virtual Visit        Diane Barker has provided verbal consent on 07/30/2023 for a virtual visit (video or telephone).   CONSENT FOR VIRTUAL VISIT FOR:  Diane Barker  By participating in this virtual visit I agree to the following:  I hereby voluntarily request, consent and authorize [facility] and its employed or contracted physicians, physician assistants, nurse practitioners or other licensed health care professionals (the Practitioner), to provide me with telemedicine health care services (the "Services") as deemed necessary by the treating Practitioner. I acknowledge and consent to receive the Services by the Practitioner via telemedicine. I understand that the telemedicine visit will involve communicating with the Practitioner through live audiovisual communication technology and the disclosure of certain medical information by electronic transmission. I acknowledge that I have been given the opportunity to request an in-person assessment or other available alternative prior to the telemedicine visit and am voluntarily participating in the telemedicine visit.  I understand that I have the right to withhold or withdraw my consent to the use of telemedicine in the course of my care at any time, without affecting my right to future care or treatment, and that the Practitioner or I may terminate the telemedicine visit at any time. I understand that I have the right to inspect all information obtained and/or recorded in the course of the telemedicine visit and may receive copies of available information for a reasonable fee.  I understand that some of the potential risks of receiving the Services via telemedicine include:  Delay or interruption in medical evaluation due to technological equipment failure or  disruption; Information transmitted may not be sufficient (e.g. poor resolution of images) to allow for appropriate medical decision making by the Practitioner; and/or  In rare instances, security protocols could fail, causing a breach of personal health information.  Furthermore, I acknowledge that it is my responsibility to provide information about my medical history, conditions and care that is complete and accurate to the best of my ability. I acknowledge that Practitioner's advice, recommendations, and/or decision may be based on factors not within their control, such as incomplete or inaccurate data provided by me or distortions of diagnostic images or specimens that may result from electronic transmissions. I understand that the practice of medicine is not an exact science and that Practitioner makes no warranties or guarantees regarding treatment outcomes. I acknowledge that a copy of this consent can be made available to me via my patient portal [facility] MyChart), or I can request a printed copy by calling the office of [facility].    I understand that my insurance will be billed for this visit.   I have read or had this consent read to me. I understand the contents of this consent, which adequately explains the benefits and risks of the Services being provided via telemedicine.  I have been provided ample opportunity to ask questions regarding this consent and the Services and have had my questions answered to my satisfaction. I give my informed consent for the services to be provided through the use of telemedicine in my medical care

## 2023-07-30 NOTE — Telephone Encounter (Signed)
 Pt has been scheduled tele preop appt 08/06/23. Consent has been done. Med rec to be done at the hospital now as the pt is there for her preop.

## 2023-07-30 NOTE — Patient Instructions (Signed)
 Good luck with your knee- it will go fine. Please call if we scan help.

## 2023-07-31 ENCOUNTER — Other Ambulatory Visit: Payer: Self-pay | Admitting: Orthopaedic Surgery

## 2023-07-31 ENCOUNTER — Telehealth: Payer: Self-pay | Admitting: Orthopaedic Surgery

## 2023-07-31 MED ORDER — TRAMADOL HCL 50 MG PO TABS: 50.0000 mg | ORAL_TABLET | Freq: Four times a day (QID) | ORAL | 0 refills | Status: DC | PRN

## 2023-07-31 MED ORDER — METHOCARBAMOL 500 MG PO TABS: 500.0000 mg | ORAL_TABLET | Freq: Four times a day (QID) | ORAL | 1 refills | Status: DC | PRN

## 2023-07-31 NOTE — Progress Notes (Addendum)
 Anesthesia Chart Review:  Case: [id number] Date/Time: 08/11/23 1100   Procedure: ARTHROPLASTY, KNEE, TOTAL (Left: Knee)   Anesthesia type: Choice   Diagnosis: Primary osteoarthritis of left knee [M17.12]   Pre-op diagnosis: left knee osteoarthritis   [facility] ROOM 05 / [facility]   Surgeons: Arnie Lao, MD       DISCUSSION: Patient is a 73 year old female scheduled for the above procedure.  History includes never smoker, HTN, hypercholesterolemia, CAD (DES LAD 04/03/22), right breast cancer (s/p right lumpectomy, chemoradiation 2000), RLE DVT (06/2013), PE (06/2013), asthma, GERD, chronic low back pain, osteoarthritis, obesity.  She had recent pulmonology visit with Dr. Linder Revere on 07/30/23. Moderate, persistent asthma stable without recent exacerbations. Continue albuterol  as needed. Noted, "She is pending left total knee replacement soon. No obvious pulmonary red-flags at this time."  She had cardiology evaluation by Dr. Chancy Comber on 05/14/23. CAD was stable at that time. She wrote, "Intermediate risk for knee surgery from a cardiac perspective as of today with no current chest pain." Six month follow-up planned. Additional APP input as of 07/30/23 states, "Per office protocol, patient can hold Xarelto  for 3 days prior to procedure." Last Xarelto  is planned for 08/07/23.    By CTA Chest 05/18/23 she has a Right-sided thoracic aortic arch is noted. Aberrant left subclavian artery.  Anesthesia team to evaluate on the day of surgery.   UPDATE 08/06/23 4:03 PM:  Updated preoperative cardiology risk assessment today per Rejeana Card, NP:  "- Patient's RCRI score is 0.9%   The patient affirms she has been doing well without any new cardiac symptoms. They are able to achieve 5 METS without cardiac limitations. Therefore, based on ACC/AHA guidelines, the patient would be at acceptable risk for the planned procedure without further cardiovascular testing. The patient was advised that if she  develops new symptoms prior to surgery to contact our office to arrange for a follow-up visit, and she verbalized understanding.    The patient was advised that if she develops new symptoms prior to surgery to contact our office to arrange for a follow-up visit, and she verbalized understanding.   Per office protocol, patient can hold Xarelto  for 3 days prior to procedure."   VS: BP 110/66   Pulse (!) 59   Temp 36.7 C   Resp 18   Ht 5\' 5"  (1.651 m)   Wt 104.7 kg   SpO2 98%   BMI 38.42 kg/m   PROVIDERS: Margarete Sharps, MD is PCP  Grady Lawman, MD is cardiologist Rosa College, MD is pulmonologist   LABS: Labs reviewed: Acceptable for surgery. (all labs ordered are listed, but only abnormal results are displayed)  Labs Reviewed  SURGICAL PCR SCREEN - Abnormal; Notable for the following components:      Result Value   Staphylococcus aureus POSITIVE (*)    All other components within normal limits  CBC - Abnormal; Notable for the following components:   RBC 3.53 (*)    Hemoglobin 11.8 (*)    MCV 102.5 (*)    All other components within normal limits  BASIC METABOLIC PANEL WITH GFR - Abnormal; Notable for the following components:   Glucose, Bld 100 (*)    Anion gap 3 (*)    All other components within normal limits   PFT 01/08/2018: "insignificant response to dilator, mild restriction, diffusion mildly reduced.  FVC 2.52/78%, FEV1 2.21/90%, ratio 1.88, FEF 25-75% 3.30/259%, TLC 75%, DLCO 63%"   IMAGES: MRI L-spine 06/18/23: IMPRESSION: 1. Chronic  bilateral pars defects at L5 with anterolisthesis of 4-5 mm. Disc degeneration with mild bulging of the disc. No compressive stenosis of the canal or foramina. Findings could relate to low back pain. 2. Mild bulging of the discs at T12-L1, L3-4 and L4-5 without stenosis.   CTA Chest 05/18/23: IMPRESSION: No evidence of pulmonary emboli. Fall sliding-type hiatal hernia. No other focal abnormality is noted.   CT  Abd/pelvis 05/18/23: IMPRESSION: 1. Punctate 1 mm stone at the right UVJ. No hydronephrosis on the right. 2. Moderate left pelvicaliectasis with abrupt transition point at the UPJ, slightly progressed from 11/13/2013. 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 On delayed phase, there is excreted contrast beyond the site of stenosis. 3. Extensive sigmoid diverticulosis without evidence of diverticulitis. 4. Misty mesentery about a few subcentimeter lymph nodes in the central mesentery, nonspecific but can be seen in the setting of mesenteric panniculitis. 5. Chronic L5 spondylolysis with grade 1 spondylolisthesis. 6. Aortic Atherosclerosis (ICD10-I70.0).      EKG: 05/18/23: Normal sinus rhythm Left ventricular hypertrophy with repolarization abnormality ( R in aVL , Cornell product ) Abnormal ECG When compared with ECG of 14-May-2023 08:39, PREVIOUS ECG IS PRESENT since last tracing no significant change Confirmed by Hershel Los [phone]) on 05/19/2023 8:38:33 AM   CV: Cardiac cath 04/03/22:   Estella Helling LAD to Prox LAD lesion is 10% stenosed.   Prox Cx to Mid Cx lesion is 30% stenosed.   Ost RCA to Prox RCA lesion is 20% stenosed.   Mid RCA lesion is 20% stenosed.   Ost 1st Diag lesion is 30% stenosed.   Ost RPDA to RPDA lesion is 20% stenosed.   Ost 2nd Diag lesion is 30% stenosed.   Mid LAD lesion is 60% stenosed.   Prox LAD lesion is 65% stenosed.   A drug-eluting stent was successfully placed using a STENT ONYX FRONTIER 3.0X34.   Post intervention, there is a 0% residual stenosis.   Post intervention, there is a 0% residual stenosis.   The left ventricular systolic function is normal.   LV end diastolic pressure is mildly elevated.   The left ventricular ejection fraction is 55-65% by visual estimate.   Single vessel obstructive CAD involving the mid LAD which is heavily calcified. Flow analysis suggest no fixed microvascular disease but abnormal CFR suggesting significant  vasoreactivity abnormality.  So she has both epicardial disease and abnormal vasoreactivity.  Normal LV function Mildly elevated LVEDP Successful PCI of the mid LAD with OCT guidance and DES x 1   Plan: recommend DAPT with ASA 81 mg daily for one month. Plavix  75 mg daily for 6 months. May resume Xarelto  tonight. Anticipate same day DC. Recommend intensive vasodilator therapy with nitrates and calcium  channel blocker.    Echo 11/19/21: IMPRESSIONS   1. Left ventricular ejection fraction, by estimation, is 55 to 60%. The  left ventricle has normal function. The left ventricle has no regional  wall motion abnormalities. Left ventricular diastolic parameters are  indeterminate.   2. Right ventricular systolic function is normal. The right ventricular  size is normal. Tricuspid regurgitation signal is inadequate for assessing  PA pressure.   3. Left atrial size was moderately dilated.   4. The mitral valve is degenerative. Mild mitral valve regurgitation. No  evidence of mitral stenosis.   5. The aortic valve is tricuspid. Aortic valve regurgitation is trivial.  No aortic stenosis is present.  - Comparison(s): No significant change from prior study. 01/16/18 EF 55-60%.  CPET 10/14/21: "Conclusion: Exercise testing with gas exchange demonstrates normal functional capacity when compared to matched sedentary norms. There is no indication for cardiopulmonary abnormality. Patient appears primarily ventilatory limited due to body habitus."   Cardiac event monitor 10/25/19 - 11/14/19: IMPRESSION: Infrequent ectopy, brief episode of asymptomatic SVT. Symptoms associated with SR and sinus tachycardia.     Past Medical History:  Diagnosis Date   Anxiety    Arthritis    "all over my body" (01/15/2018)   Breast cancer, right breast (HCC) 2000   Rt lumpectomy, Chemo/XRT/rt axillary node    Bronchial asthma    Chronic lower back pain    Depression    Diverticulosis    DVT (deep venous  thrombosis) (HCC) 06/2013   RLE   GERD (gastroesophageal reflux disease)    High cholesterol    Hx of adenomatous colonic polyps    Hypertension    Obesity    Osteoarthritis    Personal history of chemotherapy    Personal history of radiation therapy    Pulmonary embolism (HCC) 06/2013   "both lungs"   Spondylolisthesis     Past Surgical History:  Procedure Laterality Date   BREAST BIOPSY Right 2000   BREAST LUMPECTOMY Right 2000   for CA txed w/ chemo and radiation, right   CARDIAC CATHETERIZATION  03/21/2020   CORONARY ATHERECTOMY N/A 04/03/2022   Procedure: CORONARY ATHERECTOMY;  Surgeon: Swaziland, Peter M, MD;  Location: [facility] INVASIVE CV LAB;  Service: Cardiovascular;  Laterality: N/A;   CORONARY IMAGING/OCT N/A 04/03/2022   Procedure: INTRAVASCULAR IMAGING/OCT;  Surgeon: Swaziland, Peter M, MD;  Location: [facility] INVASIVE CV LAB;  Service: Cardiovascular;  Laterality: N/A;   CORONARY PRESSURE/FFR STUDY N/A 01/18/2018   Procedure: INTRAVASCULAR PRESSURE WIRE/FFR STUDY;  Surgeon: Odie Benne, MD;  Location: [facility] INVASIVE CV LAB;  Service: Cardiovascular;  Laterality: N/A;   CORONARY PRESSURE/FFR STUDY N/A 04/03/2022   Procedure: INTRAVASCULAR PRESSURE WIRE/FFR STUDY;  Surgeon: Swaziland, Peter M, MD;  Location: [facility] INVASIVE CV LAB;  Service: Cardiovascular;  Laterality: N/A;   CORONARY STENT INTERVENTION N/A 04/03/2022   Procedure: CORONARY STENT INTERVENTION;  Surgeon: Swaziland, Peter M, MD;  Location: [facility] INVASIVE CV LAB;  Service: Cardiovascular;  Laterality: N/A;   HERNIA REPAIR     LEFT HEART CATH AND CORONARY ANGIOGRAPHY N/A 01/18/2018   Procedure: LEFT HEART CATH AND CORONARY ANGIOGRAPHY;  Surgeon: Odie Benne, MD;  Location: [facility] INVASIVE CV LAB;  Service: Cardiovascular;  Laterality: N/A;   LEFT HEART CATH AND CORONARY ANGIOGRAPHY N/A 03/21/2020   Procedure: LEFT HEART CATH AND CORONARY ANGIOGRAPHY;  Surgeon: Wenona Hamilton, MD;  Location: [facility] INVASIVE CV LAB;  Service:  Cardiovascular;  Laterality: N/A;   LEFT HEART CATH AND CORONARY ANGIOGRAPHY N/A 04/03/2022   Procedure: LEFT HEART CATH AND CORONARY ANGIOGRAPHY;  Surgeon: Swaziland, Peter M, MD;  Location: [facility] INVASIVE CV LAB;  Service: Cardiovascular;  Laterality: N/A;   TUBAL LIGATION     UMBILICAL HERNIA REPAIR  1983    MEDICATIONS:  methocarbamol  (ROBAXIN ) 500 MG tablet   mirabegron ER (MYRBETRIQ) 50 MG TB24 tablet   acetaminophen  (TYLENOL ) 650 MG CR tablet   albuterol  (VENTOLIN  HFA) 108 (90 Base) MCG/ACT inhaler   amLODipine  (NORVASC ) 2.5 MG tablet   atorvastatin  (LIPITOR) 40 MG tablet   Cholecalciferol  (D3) 50 MCG (2000 UT) TABS   dexlansoprazole  (DEXILANT ) 60 MG capsule   escitalopram  (LEXAPRO ) 10 MG tablet   isosorbide  mononitrate (IMDUR ) 60 MG 24 hr tablet   nitroGLYCERIN  (  NITROSTAT ) 0.4 MG SL tablet   rivaroxaban  (XARELTO ) 20 MG TABS tablet   Spacer/Aero-Holding Chambers (AEROCHAMBER MV) inhaler   traMADol  (ULTRAM ) 50 MG tablet   No current facility-administered medications for this encounter.    Ella Gun, PA-C Surgical Short Stay/Anesthesiology [facility] Phone [phone] [facility] [address] Phone [phone] 07/31/2023 3:24 PM

## 2023-07-31 NOTE — Telephone Encounter (Signed)
 Pt called stating she has surgery for knee on 5/27 but she has been up crying all night due to knee pain is so severe. She is asking for this message to be sent urgent. She needs something for pain. She states she can take it no more. Please send pain medication to [facility]. Please call pt when sent I. Pt phone number is

## 2023-07-31 NOTE — Anesthesia Preprocedure Evaluation (Addendum)
 Anesthesia Evaluation  Patient identified by MRN, date of birth, ID band Patient awake    Reviewed: Allergy & Precautions, H&P , NPO status , Patient's Chart, lab work & pertinent test results  Airway Mallampati: II  TM Distance: >3 FB Neck ROM: Full    Dental  (+) Edentulous Upper, Poor Dentition, Missing   Pulmonary asthma , COPD   Pulmonary exam normal breath sounds clear to auscultation       Cardiovascular hypertension, Pt. on medications + angina  Normal cardiovascular exam Rhythm:Regular Rate:Normal     Neuro/Psych   Anxiety Depression    negative neurological ROS  negative psych ROS   GI/Hepatic Neg liver ROS,GERD  ,,  Endo/Other  negative endocrine ROS    Renal/GU negative Renal ROS  negative genitourinary   Musculoskeletal  (+) Arthritis , Osteoarthritis,    Abdominal  (+) + obese  Peds negative pediatric ROS (+)  Hematology negative hematology ROS (+)   Anesthesia Other Findings   Reproductive/Obstetrics negative OB ROS                             Anesthesia Physical Anesthesia Plan  ASA: 3  Anesthesia Plan: MAC   Post-op Pain Management: Regional block*   Induction: Intravenous  PONV Risk Score and Plan: 2 and Ondansetron , Propofol infusion and Treatment may vary due to age or medical condition  Airway Management Planned: Simple Face Mask  Additional Equipment:   Intra-op Plan:   Post-operative Plan:   Informed Consent: I have reviewed the patients History and Physical, chart, labs and discussed the procedure including the risks, benefits and alternatives for the proposed anesthesia with the patient or authorized representative who has indicated his/her understanding and acceptance.     Dental advisory given  Plan Discussed with: CRNA  Anesthesia Plan Comments: (PAT note written 07/31/2023 by Allison Zelenak, PA-C.  )       Anesthesia Quick  Evaluation

## 2023-08-04 IMAGING — CT CT NECK W/ CM
1 of 5 series · 2 of 14 positions shown, 3 images · IV contrast (agent unspecified)
Comparison: None.

CLINICAL DATA: Lymphadenopathy

EXAM:
CT NECK WITH CONTRAST
TECHNIQUE: Multidetector CT imaging of the neck was performed using the
standard protocol following the bolus administration of intravenous
contrast.

[Series 3: neck · axial · 0.47mm/px · z∈[-266,-186]mm · 2 of 121 slices shown, 3 images]
[im 41/121  soft-tissue]
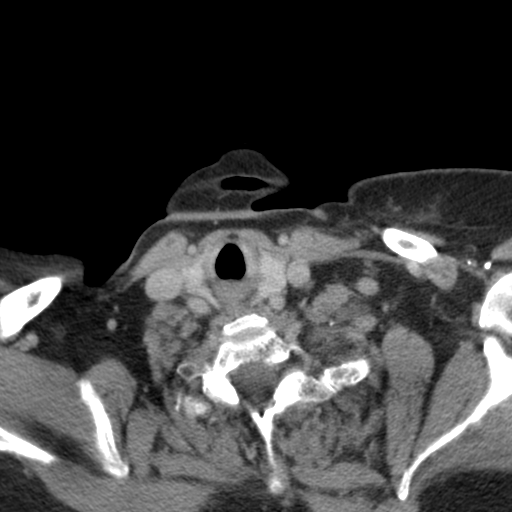
[im 41/121  bone]
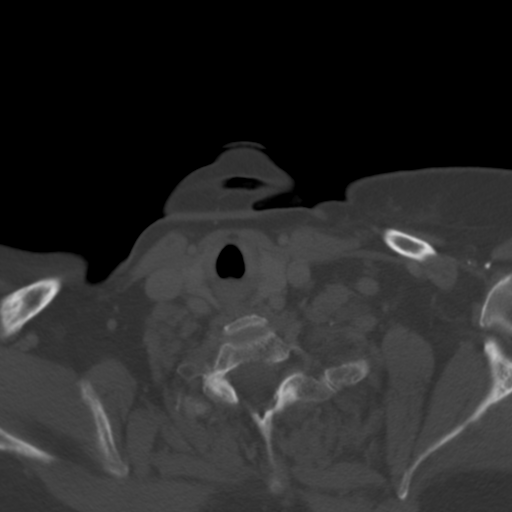
[im 81/121  bone]
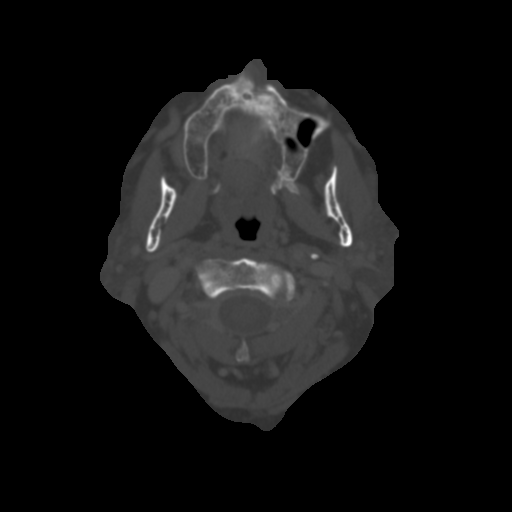

[2 of 14 positions shown; findings below may reference images not displayed]

RADIATION DOSE REDUCTION: This exam was performed according to the
departmental dose-optimization program which includes automated
exposure control, adjustment of the mA and/or kV according to
patient size and/or use of iterative reconstruction technique.

CONTRAST:  75mL 9FJO0R-LII IOPAMIDOL (9FJO0R-LII) INJECTION 61%

Creatinine was obtained on site at [HOSPITAL] at [HOSPITAL].

Results: Creatinine 1 mg/dL.
FINDINGS: Pharynx and larynx: Unremarkable.  No mass or swelling.

Salivary glands: Parotid and submandibular glands are unremarkable.

Thyroid: Unremarkable.

Lymph nodes: No enlarged or abnormal density nodes.

Vascular: Partially imaged right-sided aortic arch. Minor calcified
plaque at the common carotid bifurcations.

Limited intracranial: No abnormal enhancement.

Visualized orbits: No significant abnormality.

Mastoids and visualized paranasal sinuses: No significant
opacification.

Skeleton: Mild cervical spine degenerative changes. Vertebral body
hemangioma at C7.

Upper chest: No apical lung mass.

Other: Skin marker overlies the posterolateral right neck at the
level of the mandible. There are a few small posterior triangle
nodes in this region.
IMPRESSION: No neck mass.

Several small and nonenlarged right posterior triangle lymph nodes
underlie the skin marker. These are nonspecific and suggest clinical
follow-up since palpable.

## 2023-08-06 ENCOUNTER — Ambulatory Visit: Attending: Cardiology

## 2023-08-06 DIAGNOSIS — Z01810 Encounter for preprocedural cardiovascular examination: Secondary | ICD-10-CM

## 2023-08-06 NOTE — Progress Notes (Signed)
 Virtual Visit via Telephone Note   Because of Diane Barker co-morbid illnesses, she is at least at moderate risk for complications without adequate follow up.  This format is felt to be most appropriate for this patient at this time.  Due to technical limitations with video connection (technology), today's appointment will be conducted as an audio only telehealth visit, and MILISSA FESPERMAN verbally agreed to proceed in this manner.   All issues noted in this document were discussed and addressed.  No physical exam could be performed with this format.  Evaluation Performed:  Preoperative cardiovascular risk assessment _____________   Date:  08/06/2023   Patient ID:  Diane Barker, DOB 19-Nov-1950, MRN [id number] Patient Location:  Home Provider location:   Office  Primary Care Provider:  Margarete Sharps, MD Primary Cardiologist:  Euell Herrlich, MD  Chief Complaint / Patient Profile   73 y.o. y/o female with a h/o history of PE (on Xarelto ), CAD, breast CA, moderate persistent asthma, HTN who is pending left total knee arthroplasty and presents today for telephonic preoperative cardiovascular risk assessment.  History of Present Illness    Diane Barker is a 73 y.o. female who presents via audio/video conferencing for a telehealth visit today.  Pt was last seen in cardiology clinic on 05/14/2023 by Dr. Chancy Comber.  At that time Diane Barker was doing well with no complaints of chest pain and well-controlled BP.  She was granted clearance for knee procedure at that time.  She was seen in the ED on 06/04/2023 with nausea vomiting and weakness.  She was treated for UTI and advised to follow-up with PCP.  The patient is now pending procedure as outlined above. Since her last visit, she reports doing well with no new cardiac complaints.  She is able to complete greater than 4 METS of activity but is limited with knee pain.  She denies any new cardiac issues since her previous follow-up visit.  She denies  chest pain, shortness of breath, lower extremity edema, fatigue, palpitations, melena, hematuria, hemoptysis, diaphoresis, weakness, presyncope, syncope, orthopnea, and PND.    Past Medical History    Past Medical History:  Diagnosis Date   Anxiety    Arthritis    "all over my body" (01/15/2018)   Breast cancer, right breast (HCC) 2000   Rt lumpectomy, Chemo/XRT/rt axillary node    Bronchial asthma    Chronic lower back pain    Depression    Diverticulosis    DVT (deep venous thrombosis) (HCC) 06/2013   RLE   GERD (gastroesophageal reflux disease)    High cholesterol    Hx of adenomatous colonic polyps    Hypertension    Obesity    Osteoarthritis    Personal history of chemotherapy    Personal history of radiation therapy    Pulmonary embolism (HCC) 06/2013   "both lungs"   Spondylolisthesis    Past Surgical History:  Procedure Laterality Date   BREAST BIOPSY Right 2000   BREAST LUMPECTOMY Right 2000   for CA txed w/ chemo and radiation, right   CARDIAC CATHETERIZATION  03/21/2020   CORONARY ATHERECTOMY N/A 04/03/2022   Procedure: CORONARY ATHERECTOMY;  Surgeon: Swaziland, Peter M, MD;  Location: [facility] INVASIVE CV LAB;  Service: Cardiovascular;  Laterality: N/A;   CORONARY IMAGING/OCT N/A 04/03/2022   Procedure: INTRAVASCULAR IMAGING/OCT;  Surgeon: Swaziland, Peter M, MD;  Location: [facility] INVASIVE CV LAB;  Service: Cardiovascular;  Laterality: N/A;   CORONARY PRESSURE/FFR STUDY N/A  01/18/2018   Procedure: INTRAVASCULAR PRESSURE WIRE/FFR STUDY;  Surgeon: Odie Benne, MD;  Location: [facility] INVASIVE CV LAB;  Service: Cardiovascular;  Laterality: N/A;   CORONARY PRESSURE/FFR STUDY N/A 04/03/2022   Procedure: INTRAVASCULAR PRESSURE WIRE/FFR STUDY;  Surgeon: Swaziland, Peter M, MD;  Location: [facility] INVASIVE CV LAB;  Service: Cardiovascular;  Laterality: N/A;   CORONARY STENT INTERVENTION N/A 04/03/2022   Procedure: CORONARY STENT INTERVENTION;  Surgeon: Swaziland, Peter M, MD;  Location: [facility]  INVASIVE CV LAB;  Service: Cardiovascular;  Laterality: N/A;   HERNIA REPAIR     LEFT HEART CATH AND CORONARY ANGIOGRAPHY N/A 01/18/2018   Procedure: LEFT HEART CATH AND CORONARY ANGIOGRAPHY;  Surgeon: Odie Benne, MD;  Location: [facility] INVASIVE CV LAB;  Service: Cardiovascular;  Laterality: N/A;   LEFT HEART CATH AND CORONARY ANGIOGRAPHY N/A 03/21/2020   Procedure: LEFT HEART CATH AND CORONARY ANGIOGRAPHY;  Surgeon: Wenona Hamilton, MD;  Location: [facility] INVASIVE CV LAB;  Service: Cardiovascular;  Laterality: N/A;   LEFT HEART CATH AND CORONARY ANGIOGRAPHY N/A 04/03/2022   Procedure: LEFT HEART CATH AND CORONARY ANGIOGRAPHY;  Surgeon: Swaziland, Peter M, MD;  Location: [facility] INVASIVE CV LAB;  Service: Cardiovascular;  Laterality: N/A;   TUBAL LIGATION     UMBILICAL HERNIA REPAIR  1983    Allergies  Allergies  Allergen Reactions   E.E.S. [Erythromycin] Other (See Comments)    (Higher doses) stomach cramps   Lipitor [Atorvastatin ]     High doses cause fatigued myalgias   Morphine And Codeine  Other (See Comments)    hallucinations   Neurontin [Gabapentin] Other (See Comments)    Causes blackouts   Omnicef  [Cefdinir ] Itching   Penicillins Other (See Comments)    Unknown; childhood reaction.   Relafen [Nabumetone] Nausea Only    Home Medications    Prior to Admission medications   Medication Sig Start Date End Date Taking? Authorizing Provider  methocarbamol  (ROBAXIN ) 500 MG tablet Take 1 tablet (500 mg total) by mouth every 6 (six) hours as needed. 07/31/23   Arnie Lao, MD  acetaminophen  (TYLENOL ) 650 MG CR tablet Take 1,300 mg by mouth in the morning and at bedtime.    [provider]  albuterol  (VENTOLIN  HFA) 108 (90 Base) MCG/ACT inhaler INHALE 2 PUFFS INTO THE LUNGS EVERY 4 HOURS AS NEEDED FOR WHEEZING OR SHORTNESS OF BREATH. 04/06/23   [employer]osa [employer] D, MD  amLODipine  (NORVASC ) 2.5 MG tablet Take 1 tablet (2.5 mg total) by mouth daily. 05/05/23   Acharya,  Gayatri A, MD  atorvastatin  (LIPITOR) 40 MG tablet Take 1 tablet (40 mg total) by mouth daily. 05/05/23   Acharya, Gayatri A, MD  Cholecalciferol  (D3) 50 MCG (2000 UT) TABS Take 2,000 Units by mouth in the morning.    [provider]  dexlansoprazole  (DEXILANT ) 60 MG capsule Take 60 mg by mouth in the morning.    [provider]  escitalopram  (LEXAPRO ) 10 MG tablet Take 10 mg by mouth in the morning.    [provider]  isosorbide  mononitrate (IMDUR ) 60 MG 24 hr tablet Take 1.5 tablets (90 mg total) by mouth daily. Patient taking differently: Take 60 mg by mouth in the morning. 05/05/23   Acharya, Gayatri A, MD  mirabegron ER (MYRBETRIQ) 50 MG TB24 tablet Take 50 mg by mouth daily.    [provider]  nitroGLYCERIN  (NITROSTAT ) 0.4 MG SL tablet Place 1 tablet (0.4 mg total) under the tongue every 5 (five) minutes as needed. 03/27/22   Acharya, Gayatri  A, MD  rivaroxaban  (XARELTO ) 20 MG TABS tablet Take 1 tablet (20 mg total) by mouth in the morning. 05/04/23   Acharya, Gayatri A, MD  Spacer/Aero-Holding Idelle Majors (AEROCHAMBER MV) inhaler Use as instructed 07/30/18   [employer] D, MD  traMADol  (ULTRAM ) 50 MG tablet Take 1-2 tablets (50-100 mg total) by mouth every 6 (six) hours as needed. 07/31/23   Arnie Lao, MD    Physical Exam    Vital Signs:  BETHENY SUCHECKI does not have vital signs available for review today.110/68  Given telephonic nature of communication, physical exam is limited. AAOx3. NAD. Normal affect.  Speech and respirations are unlabored.  Accessory Clinical Findings    None  Assessment & Plan    1.  Preoperative Cardiovascular Risk Assessment: - Patient's RCRI score is 0.9%  The patient affirms she has been doing well without any new cardiac symptoms. They are able to achieve 5 METS without cardiac limitations. Therefore, based on ACC/AHA guidelines, the patient would be at acceptable risk for the planned procedure without  further cardiovascular testing. The patient was advised that if she develops new symptoms prior to surgery to contact our office to arrange for a follow-up visit, and she verbalized understanding.   The patient was advised that if she develops new symptoms prior to surgery to contact our office to arrange for a follow-up visit, and she verbalized understanding.  Per office protocol, patient can hold Xarelto  for 3 days prior to procedure.    A copy of this note will be routed to requesting surgeon.  Time:   Today, I have spent 6 minutes with the patient with telehealth technology discussing medical history, symptoms, and management plan.     Francene Ing, Retha Cast, NP  08/06/2023, 7:29 AM

## 2023-08-07 NOTE — Progress Notes (Signed)
 Patient updated on arrival time for aurgery on 08/11/2023. Patient to arrive at 109. Patient verbalized understanding.

## 2023-08-10 DIAGNOSIS — M1712 Unilateral primary osteoarthritis, left knee: Secondary | ICD-10-CM | POA: Insufficient documentation

## 2023-08-10 NOTE — H&P (Signed)
 TOTAL KNEE ADMISSION H&P  Patient is being admitted for left total knee arthroplasty.  Subjective:  Chief Complaint:left knee pain.  HPI: Diane Barker, 73 y.o. female, has a history of pain and functional disability in the left knee due to arthritis and has failed non-surgical conservative treatments for greater than 12 weeks to includeNSAID's and/or analgesics, corticosteriod injections, viscosupplementation injections, flexibility and strengthening excercises, use of assistive devices, weight reduction as appropriate, and activity modification.  Onset of symptoms was gradual, starting 6 years ago with gradually worsening course since that time. The patient noted no past surgery on the left knee(s).  Patient currently rates pain in the left knee(s) at 10 out of 10 with activity. Patient has night pain, worsening of pain with activity and weight bearing, pain that interferes with activities of daily living, pain with passive range of motion, crepitus, and joint swelling.  Patient has evidence of subchondral sclerosis, periarticular osteophytes, and joint space narrowing by imaging studies. There is no active infection.  Patient Active Problem List   Diagnosis Date Noted   Unilateral primary osteoarthritis, left knee 08/10/2023   Hyperlipidemia 08/01/2020   Asthmatic bronchitis, mild persistent, uncomplicated    Unstable angina (HCC) 01/16/2018   Cellulitis of right arm 10/07/2016   Sepsis (HCC) 10/07/2016   Chest pain 10/07/2016   Dyspnea on exertion 05/24/2015   DVT, lower extremity (HCC) 05/09/2014   Right calf pain 06/21/2013   Pulmonary embolism (HCC) 06/21/2013   Dysphagia 06/27/2011   Cough 06/27/2011   CHEST PAIN 01/18/2009   RHINITIS 04/13/2007   Obstructive chronic bronchitis with acute bronchitis (HCC) 04/13/2007   GERD 04/13/2007   Depression 01/21/2007   OSTEOARTHRITIS 01/21/2007   LOW BACK PAIN 01/21/2007   BREAST CANCER, HX OF 01/21/2007   Past Medical History:   Diagnosis Date   Anxiety    Arthritis    "all over my body" (01/15/2018)   Breast cancer, right breast (HCC) 2000   Rt lumpectomy, Chemo/XRT/rt axillary node    Bronchial asthma    Chronic lower back pain    Depression    Diverticulosis    DVT (deep venous thrombosis) (HCC) 06/2013   RLE   GERD (gastroesophageal reflux disease)    High cholesterol    Hx of adenomatous colonic polyps    Hypertension    Obesity    Osteoarthritis    Personal history of chemotherapy    Personal history of radiation therapy    Pulmonary embolism (HCC) 06/2013   "both lungs"   Spondylolisthesis     Past Surgical History:  Procedure Laterality Date   BREAST BIOPSY Right 2000   BREAST LUMPECTOMY Right 2000   for CA txed w/ chemo and radiation, right   CARDIAC CATHETERIZATION  03/21/2020   CORONARY ATHERECTOMY N/A 04/03/2022   Procedure: CORONARY ATHERECTOMY;  Surgeon: Swaziland, Peter M, MD;  Location[facility] INVASIVE CV LAB;  Service: Cardiovascular;  Laterality: N/A;   CORONARY IMAGING/OCT N/A 04/03/2022   Procedure: INTRAVASCULAR IMAGING/OCT;  Surgeon: Swaziland, Peter M, MD;  Location: [facility] INVASIVE CV LAB;  Service: Cardiovascular;  Laterality: N/A;   CORONARY PRESSURE/FFR STUDY N/A 01/18/2018   Procedure: INTRAVASCULAR PRESSURE WIRE/FFR STUDY;  Surgeon: Odie Benne, MD;  Location: [facility] INVASIVE CV LAB;  Service: Cardiovascular;  Laterality: N/A;   CORONARY PRESSURE/FFR STUDY N/A 04/03/2022   Procedure: INTRAVASCULAR PRESSURE WIRE/FFR STUDY;  Surgeon: Swaziland, Peter M, MD;  Location: [facility] INVASIVE CV LAB;  Service: Cardiovascular;  Laterality: N/A;   CORONARY STENT INTERVENTION N/A  04/03/2022   Procedure: CORONARY STENT INTERVENTION;  Surgeon: Swaziland, Peter M, MD;  Location: [facility] INVASIVE CV LAB;  Service: Cardiovascular;  Laterality: N/A;   HERNIA REPAIR     LEFT HEART CATH AND CORONARY ANGIOGRAPHY N/A 01/18/2018   Procedure: LEFT HEART CATH AND CORONARY ANGIOGRAPHY;  Surgeon: Odie Benne, MD;   Location: [facility] INVASIVE CV LAB;  Service: Cardiovascular;  Laterality: N/A;   LEFT HEART CATH AND CORONARY ANGIOGRAPHY N/A 03/21/2020   Procedure: LEFT HEART CATH AND CORONARY ANGIOGRAPHY;  Surgeon: Wenona Hamilton, MD;  Location: [facility] INVASIVE CV LAB;  Service: Cardiovascular;  Laterality: N/A;   LEFT HEART CATH AND CORONARY ANGIOGRAPHY N/A 04/03/2022   Procedure: LEFT HEART CATH AND CORONARY ANGIOGRAPHY;  Surgeon: Swaziland, Peter M, MD;  Location: [facility] INVASIVE CV LAB;  Service: Cardiovascular;  Laterality: N/A;   TUBAL LIGATION     UMBILICAL HERNIA REPAIR  1983    No current facility-administered medications for this encounter.   Current Outpatient Medications  Medication Sig Dispense Refill Last Dose/Taking   acetaminophen  (TYLENOL ) 650 MG CR tablet Take 1,300 mg by mouth in the morning and at bedtime.   Taking   albuterol  (VENTOLIN  HFA) 108 (90 Base) MCG/ACT inhaler INHALE 2 PUFFS INTO THE LUNGS EVERY 4 HOURS AS NEEDED FOR WHEEZING OR SHORTNESS OF BREATH. 51 each 4 Taking As Needed   amLODipine  (NORVASC ) 2.5 MG tablet Take 1 tablet (2.5 mg total) by mouth daily. 90 tablet 2 Taking   atorvastatin  (LIPITOR) 40 MG tablet Take 1 tablet (40 mg total) by mouth daily. 90 tablet 2 Taking   Cholecalciferol  (D3) 50 MCG (2000 UT) TABS Take 2,000 Units by mouth in the morning.   Taking   dexlansoprazole  (DEXILANT ) 60 MG capsule Take 60 mg by mouth in the morning.   Taking   escitalopram  (LEXAPRO ) 10 MG tablet Take 10 mg by mouth in the morning.   Taking   isosorbide  mononitrate (IMDUR ) 60 MG 24 hr tablet Take 1.5 tablets (90 mg total) by mouth daily. (Patient taking differently: Take 60 mg by mouth in the morning.) 135 tablet 2 Taking Differently   methocarbamol  (ROBAXIN ) 500 MG tablet Take 1 tablet (500 mg total) by mouth every 6 (six) hours as needed. 40 tablet 1    nitroGLYCERIN  (NITROSTAT ) 0.4 MG SL tablet Place 1 tablet (0.4 mg total) under the tongue every 5 (five) minutes as needed. 25 tablet 3 Taking  As Needed   rivaroxaban  (XARELTO ) 20 MG TABS tablet Take 1 tablet (20 mg total) by mouth in the morning. 30 tablet 5 Taking   mirabegron ER (MYRBETRIQ) 50 MG TB24 tablet Take 50 mg by mouth daily.      Spacer/Aero-Holding Chambers (AEROCHAMBER MV) inhaler Use as instructed 1 each 0    traMADol  (ULTRAM ) 50 MG tablet Take 1-2 tablets (50-100 mg total) by mouth every 6 (six) hours as needed. 30 tablet 0    Allergies  Allergen Reactions   E.E.S. [Erythromycin] Other (See Comments)    (Higher doses) stomach cramps   Lipitor [Atorvastatin ]     High doses cause fatigued myalgias   Morphine And Codeine  Other (See Comments)    hallucinations   Neurontin [Gabapentin] Other (See Comments)    Causes blackouts   Omnicef  [Cefdinir ] Itching   Penicillins Other (See Comments)    Unknown; childhood reaction.   Relafen [Nabumetone] Nausea Only    Social History   Tobacco Use   Smoking status: Never   Smokeless tobacco: Never  Substance  Use Topics   Alcohol use: Never    Alcohol/week: 0.0 standard drinks of alcohol    Family History  Problem Relation Age of Onset   Asthma Mother    COPD Mother    Heart attack Mother    Diabetes Mother    Lung cancer Father    Asthma Daughter    Kidney failure Brother    Diabetes Brother        x 2   Heart attack Brother    Breast cancer Maternal Aunt    Colon cancer Paternal Aunt    Diabetes Daughter        x 2   Hypertension Brother      Review of Systems  Objective:  Physical Exam Vitals reviewed.  Constitutional:      Appearance: Normal appearance. She is obese.  HENT:     Head: Normocephalic and atraumatic.  Eyes:     Extraocular Movements: Extraocular movements intact.     Pupils: Pupils are equal, round, and reactive to light.  Cardiovascular:     Rate and Rhythm: Normal rate and regular rhythm.  Pulmonary:     Effort: Pulmonary effort is normal.     Breath sounds: Normal breath sounds.  Abdominal:     Palpations: Abdomen is  soft.  Musculoskeletal:     Cervical back: Normal range of motion and neck supple.     Left knee: Effusion, bony tenderness and crepitus present. Decreased range of motion. Tenderness present over the medial joint line and lateral joint line. Abnormal alignment and abnormal meniscus.  Neurological:     Mental Status: She is alert and oriented to person, place, and time.  Psychiatric:        Behavior: Behavior normal.     Vital signs in last 24 hours:    Labs:   Estimated body mass index is 38.86 kg/m as calculated from the following:   Height as of 07/30/23: 5\' 5"  (1.651 m).   Weight as of 07/30/23: 105.9 kg.   Imaging Review Plain radiographs demonstrate severe degenerative joint disease of the left knee(s). The overall alignment ismild varus. The bone quality appears to be good for age and reported activity level.      Assessment/Plan:  End stage arthritis, left knee   The patient history, physical examination, clinical judgment of the provider and imaging studies are consistent with end stage degenerative joint disease of the left knee(s) and total knee arthroplasty is deemed medically necessary. The treatment options including medical management, injection therapy arthroscopy and arthroplasty were discussed at length. The risks and benefits of total knee arthroplasty were presented and reviewed. The risks due to aseptic loosening, infection, stiffness, patella tracking problems, thromboembolic complications and other imponderables were discussed. The patient acknowledged the explanation, agreed to proceed with the plan and consent was signed. Patient is being admitted for inpatient treatment for surgery, pain control, PT, OT, prophylactic antibiotics, VTE prophylaxis, progressive ambulation and ADL's and discharge planning. The patient is planning to be discharged home with home health services

## 2023-08-11 ENCOUNTER — Encounter (HOSPITAL_COMMUNITY)
Admission: RE | Disposition: A | Discharge status: Skilled Nursing Facility | Payer: Self-pay | Source: Home / Self Care | Attending: Orthopaedic Surgery

## 2023-08-11 ENCOUNTER — Other Ambulatory Visit: Payer: Self-pay

## 2023-08-11 ENCOUNTER — Ambulatory Visit (HOSPITAL_COMMUNITY): Admitting: Certified Registered"

## 2023-08-11 ENCOUNTER — Ambulatory Visit (HOSPITAL_COMMUNITY): Payer: Self-pay | Admitting: Vascular Surgery

## 2023-08-11 ENCOUNTER — Observation Stay (HOSPITAL_COMMUNITY)

## 2023-08-11 ENCOUNTER — Encounter (HOSPITAL_COMMUNITY): Payer: Self-pay | Admitting: Orthopaedic Surgery

## 2023-08-11 ENCOUNTER — Observation Stay (HOSPITAL_COMMUNITY)
Admission: RE | Admit: 2023-08-11 | Discharge: 2023-08-14 | Disposition: A | Discharge status: Skilled Nursing Facility | Attending: Orthopaedic Surgery | Admitting: Orthopaedic Surgery

## 2023-08-11 DIAGNOSIS — J4489 Other specified chronic obstructive pulmonary disease: Secondary | ICD-10-CM

## 2023-08-11 DIAGNOSIS — M1712 Unilateral primary osteoarthritis, left knee: Secondary | ICD-10-CM | POA: Diagnosis not present

## 2023-08-11 DIAGNOSIS — F418 Other specified anxiety disorders: Secondary | ICD-10-CM | POA: Diagnosis not present

## 2023-08-11 DIAGNOSIS — Z79899 Other long term (current) drug therapy: Secondary | ICD-10-CM | POA: Insufficient documentation

## 2023-08-11 DIAGNOSIS — Z955 Presence of coronary angioplasty implant and graft: Secondary | ICD-10-CM | POA: Insufficient documentation

## 2023-08-11 DIAGNOSIS — Z853 Personal history of malignant neoplasm of breast: Secondary | ICD-10-CM | POA: Insufficient documentation

## 2023-08-11 DIAGNOSIS — I10 Essential (primary) hypertension: Secondary | ICD-10-CM

## 2023-08-11 DIAGNOSIS — Z86718 Personal history of other venous thrombosis and embolism: Secondary | ICD-10-CM | POA: Insufficient documentation

## 2023-08-11 DIAGNOSIS — Z7901 Long term (current) use of anticoagulants: Secondary | ICD-10-CM | POA: Insufficient documentation

## 2023-08-11 DIAGNOSIS — J45909 Unspecified asthma, uncomplicated: Secondary | ICD-10-CM | POA: Diagnosis not present

## 2023-08-11 DIAGNOSIS — Z86711 Personal history of pulmonary embolism: Secondary | ICD-10-CM | POA: Diagnosis not present

## 2023-08-11 DIAGNOSIS — Z96652 Presence of left artificial knee joint: Secondary | ICD-10-CM

## 2023-08-11 HISTORY — PX: TOTAL KNEE ARTHROPLASTY: SHX125

## 2023-08-11 SURGERY — ARTHROPLASTY, KNEE, TOTAL
Anesthesia: Monitor Anesthesia Care | Site: Knee | Laterality: Left

## 2023-08-11 MED ORDER — BUPIVACAINE IN DEXTROSE 0.75-8.25 % IT SOLN
INTRATHECAL | Status: DC | PRN
  Administered 2023-08-11: 2 mL via INTRATHECAL

## 2023-08-11 MED ORDER — 0.9 % SODIUM CHLORIDE (POUR BTL) OPTIME
TOPICAL | Status: DC | PRN
  Administered 2023-08-11: 1000 mL

## 2023-08-11 MED ORDER — PANTOPRAZOLE SODIUM 40 MG PO TBEC
40.0000 mg | DELAYED_RELEASE_TABLET | Freq: Every day | ORAL | Status: DC
  Administered 2023-08-12 – 2023-08-14 (×3): 40 mg via ORAL
  Filled 2023-08-11 (×3): qty 1

## 2023-08-11 MED ORDER — PHENOL 1.4 % MT LIQD
1.0000 | OROMUCOSAL | Status: DC | PRN
Start: 2023-08-11 — End: 2023-08-15

## 2023-08-11 MED ORDER — RIVAROXABAN 20 MG PO TABS
20.0000 mg | ORAL_TABLET | Freq: Every day | ORAL | Status: DC
  Administered 2023-08-12 – 2023-08-14 (×3): 20 mg via ORAL
  Filled 2023-08-11 (×4): qty 1

## 2023-08-11 MED ORDER — MIDAZOLAM HCL 2 MG/2ML IJ SOLN
INTRAMUSCULAR | Status: AC
  Administered 2023-08-11: 1 mg via INTRAVENOUS
  Filled 2023-08-11: qty 2

## 2023-08-11 MED ORDER — LEVOFLOXACIN IN D5W 500 MG/100ML IV SOLN
500.0000 mg | INTRAVENOUS | Status: AC
  Administered 2023-08-11: 500 mg via INTRAVENOUS
  Filled 2023-08-11: qty 100

## 2023-08-11 MED ORDER — ALBUTEROL SULFATE (2.5 MG/3ML) 0.083% IN NEBU: 2.5000 mg | INHALATION_SOLUTION | RESPIRATORY_TRACT | Status: DC | PRN

## 2023-08-11 MED ORDER — PHENYLEPHRINE HCL-NACL 20-0.9 MG/250ML-% IV SOLN
INTRAVENOUS | Status: DC | PRN
  Administered 2023-08-11: 50 ug/min via INTRAVENOUS

## 2023-08-11 MED ORDER — SODIUM CHLORIDE 0.9 % IV SOLN: INTRAVENOUS | Status: AC

## 2023-08-11 MED ORDER — ORAL CARE MOUTH RINSE: 15.0000 mL | Freq: Once | OROMUCOSAL | Status: AC

## 2023-08-11 MED ORDER — LIDOCAINE HCL (PF) 2 % IJ SOLN
INTRAMUSCULAR | Status: DC | PRN
Start: 2023-08-11 — End: 2023-08-11
  Administered 2023-08-11: 40 mg via INTRADERMAL

## 2023-08-11 MED ORDER — AMLODIPINE BESYLATE 2.5 MG PO TABS
2.5000 mg | ORAL_TABLET | Freq: Every day | ORAL | Status: DC
  Administered 2023-08-12 – 2023-08-14 (×3): 2.5 mg via ORAL
  Filled 2023-08-11 (×4): qty 1

## 2023-08-11 MED ORDER — PROPOFOL 500 MG/50ML IV EMUL
INTRAVENOUS | Status: DC | PRN
  Administered 2023-08-11: 100 ug/kg/min via INTRAVENOUS

## 2023-08-11 MED ORDER — ONDANSETRON HCL 4 MG PO TABS
4.0000 mg | ORAL_TABLET | Freq: Four times a day (QID) | ORAL | Status: DC | PRN
  Administered 2023-08-13 – 2023-08-14 (×2): 4 mg via ORAL
  Filled 2023-08-11 (×2): qty 1

## 2023-08-11 MED ORDER — TRANEXAMIC ACID-NACL 1000-0.7 MG/100ML-% IV SOLN
1000.0000 mg | INTRAVENOUS | Status: AC
  Administered 2023-08-11: 1000 mg via INTRAVENOUS
  Filled 2023-08-11: qty 100

## 2023-08-11 MED ORDER — HYDROMORPHONE HCL 1 MG/ML IJ SOLN: 0.5000 mg | INTRAMUSCULAR | Status: DC | PRN

## 2023-08-11 MED ORDER — DEXAMETHASONE SODIUM PHOSPHATE 10 MG/ML IJ SOLN
INTRAMUSCULAR | Status: AC
  Filled 2023-08-11: qty 1

## 2023-08-11 MED ORDER — DOCUSATE SODIUM 100 MG PO CAPS
100.0000 mg | ORAL_CAPSULE | Freq: Two times a day (BID) | ORAL | Status: DC
  Administered 2023-08-11 – 2023-08-14 (×6): 100 mg via ORAL
  Filled 2023-08-11 (×7): qty 1

## 2023-08-11 MED ORDER — HYDROMORPHONE HCL 1 MG/ML IJ SOLN
INTRAMUSCULAR | Status: AC
  Filled 2023-08-11: qty 1

## 2023-08-11 MED ORDER — FENTANYL CITRATE (PF) 100 MCG/2ML IJ SOLN
INTRAMUSCULAR | Status: AC
  Administered 2023-08-11: 100 ug via INTRAVENOUS
  Filled 2023-08-11: qty 2

## 2023-08-11 MED ORDER — ATORVASTATIN CALCIUM 40 MG PO TABS
40.0000 mg | ORAL_TABLET | Freq: Every day | ORAL | Status: DC
  Administered 2023-08-12 – 2023-08-14 (×3): 40 mg via ORAL
  Filled 2023-08-11 (×3): qty 1

## 2023-08-11 MED ORDER — FENTANYL CITRATE (PF) 100 MCG/2ML IJ SOLN: 100.0000 ug | Freq: Once | INTRAMUSCULAR | Status: AC

## 2023-08-11 MED ORDER — ROPIVACAINE HCL 5 MG/ML IJ SOLN
INTRAMUSCULAR | Status: DC | PRN
  Administered 2023-08-11: 20 mL via PERINEURAL

## 2023-08-11 MED ORDER — METOCLOPRAMIDE HCL 5 MG/ML IJ SOLN: 5.0000 mg | Freq: Three times a day (TID) | INTRAMUSCULAR | Status: DC | PRN

## 2023-08-11 MED ORDER — BUPIVACAINE-EPINEPHRINE (PF) 0.25% -1:200000 IJ SOLN
INTRAMUSCULAR | Status: AC
  Filled 2023-08-11: qty 30

## 2023-08-11 MED ORDER — ONDANSETRON HCL 4 MG/2ML IJ SOLN: 4.0000 mg | Freq: Four times a day (QID) | INTRAMUSCULAR | Status: DC | PRN

## 2023-08-11 MED ORDER — SODIUM CHLORIDE 0.9 % IR SOLN
Status: DC | PRN
  Administered 2023-08-11: 1000 mL

## 2023-08-11 MED ORDER — ONDANSETRON HCL 4 MG/2ML IJ SOLN
INTRAMUSCULAR | Status: DC | PRN
  Administered 2023-08-11: 4 mg via INTRAVENOUS

## 2023-08-11 MED ORDER — PHENYLEPHRINE 80 MCG/ML (10ML) SYRINGE FOR IV PUSH (FOR BLOOD PRESSURE SUPPORT)
PREFILLED_SYRINGE | INTRAVENOUS | Status: AC
  Filled 2023-08-11: qty 10

## 2023-08-11 MED ORDER — OXYCODONE HCL 5 MG/5ML PO SOLN: 5.0000 mg | Freq: Once | ORAL | Status: DC | PRN

## 2023-08-11 MED ORDER — MIDAZOLAM HCL 2 MG/2ML IJ SOLN
INTRAMUSCULAR | Status: DC | PRN
  Administered 2023-08-11: 2 mg via INTRAVENOUS

## 2023-08-11 MED ORDER — METOCLOPRAMIDE HCL 5 MG PO TABS
5.0000 mg | ORAL_TABLET | Freq: Three times a day (TID) | ORAL | Status: DC | PRN
  Administered 2023-08-12: 10 mg via ORAL
  Filled 2023-08-11: qty 2

## 2023-08-11 MED ORDER — METHOCARBAMOL 1000 MG/10ML IJ SOLN: 500.0000 mg | Freq: Four times a day (QID) | INTRAMUSCULAR | Status: DC | PRN

## 2023-08-11 MED ORDER — OXYCODONE HCL 5 MG PO TABS: 5.0000 mg | ORAL_TABLET | Freq: Once | ORAL | Status: DC | PRN

## 2023-08-11 MED ORDER — DEXAMETHASONE SODIUM PHOSPHATE 10 MG/ML IJ SOLN
INTRAMUSCULAR | Status: DC | PRN
  Administered 2023-08-11: 5 mg via INTRAVENOUS

## 2023-08-11 MED ORDER — ISOSORBIDE MONONITRATE ER 60 MG PO TB24
60.0000 mg | ORAL_TABLET | Freq: Every morning | ORAL | Status: DC
  Administered 2023-08-12 – 2023-08-14 (×3): 60 mg via ORAL
  Filled 2023-08-11 (×3): qty 1

## 2023-08-11 MED ORDER — DIPHENHYDRAMINE HCL 12.5 MG/5ML PO ELIX: 12.5000 mg | ORAL_SOLUTION | ORAL | Status: DC | PRN

## 2023-08-11 MED ORDER — VANCOMYCIN HCL 1500 MG/300ML IV SOLN
1500.0000 mg | INTRAVENOUS | Status: AC
  Administered 2023-08-11: 1500 mg via INTRAVENOUS
  Filled 2023-08-11: qty 300

## 2023-08-11 MED ORDER — MUPIROCIN 2 % EX OINT: 1.0000 | TOPICAL_OINTMENT | Freq: Two times a day (BID) | CUTANEOUS | 0 refills | Status: AC

## 2023-08-11 MED ORDER — VITAMIN D 25 MCG (1000 UNIT) PO TABS
2000.0000 [IU] | ORAL_TABLET | Freq: Every morning | ORAL | Status: DC
  Administered 2023-08-12 – 2023-08-14 (×3): 2000 [IU] via ORAL
  Filled 2023-08-11 (×3): qty 2

## 2023-08-11 MED ORDER — MENTHOL 3 MG MT LOZG: 1.0000 | LOZENGE | OROMUCOSAL | Status: DC | PRN

## 2023-08-11 MED ORDER — LACTATED RINGERS IV SOLN: INTRAVENOUS | Status: DC

## 2023-08-11 MED ORDER — ALUM & MAG HYDROXIDE-SIMETH 200-200-20 MG/5ML PO SUSP
30.0000 mL | ORAL | Status: DC | PRN
  Administered 2023-08-12: 30 mL via ORAL
  Filled 2023-08-11: qty 30

## 2023-08-11 MED ORDER — CHLORHEXIDINE GLUCONATE 4 % EX SOLN: 1.0000 | CUTANEOUS | 1 refills | Status: AC

## 2023-08-11 MED ORDER — SODIUM CHLORIDE 0.9 % IV SOLN: 12.5000 mg | INTRAVENOUS | Status: DC | PRN

## 2023-08-11 MED ORDER — BUPIVACAINE-EPINEPHRINE (PF) 0.25% -1:200000 IJ SOLN
INTRAMUSCULAR | Status: DC | PRN
  Administered 2023-08-11: 30 mL

## 2023-08-11 MED ORDER — OXYCODONE HCL 5 MG PO TABS
10.0000 mg | ORAL_TABLET | ORAL | Status: DC | PRN
  Administered 2023-08-11: 10 mg via ORAL
  Administered 2023-08-12 – 2023-08-14 (×4): 15 mg via ORAL
  Administered 2023-08-14: 10 mg via ORAL
  Filled 2023-08-11 (×4): qty 3

## 2023-08-11 MED ORDER — ESCITALOPRAM OXALATE 10 MG PO TABS
10.0000 mg | ORAL_TABLET | Freq: Every morning | ORAL | Status: DC
  Administered 2023-08-12 – 2023-08-14 (×3): 10 mg via ORAL
  Filled 2023-08-11 (×3): qty 1

## 2023-08-11 MED ORDER — CHLORHEXIDINE GLUCONATE 0.12 % MT SOLN
15.0000 mL | Freq: Once | OROMUCOSAL | Status: AC
  Administered 2023-08-11: 15 mL via OROMUCOSAL
  Filled 2023-08-11: qty 15

## 2023-08-11 MED ORDER — OXYCODONE HCL 5 MG PO TABS
5.0000 mg | ORAL_TABLET | ORAL | Status: DC | PRN
  Administered 2023-08-11 (×2): 5 mg via ORAL
  Administered 2023-08-12: 10 mg via ORAL
  Administered 2023-08-13: 5 mg via ORAL
  Administered 2023-08-14: 10 mg via ORAL
  Filled 2023-08-11 (×2): qty 1
  Filled 2023-08-11 (×5): qty 2

## 2023-08-11 MED ORDER — MIDAZOLAM HCL 2 MG/2ML IJ SOLN: 1.0000 mg | Freq: Once | INTRAMUSCULAR | Status: AC

## 2023-08-11 MED ORDER — MIRABEGRON ER 50 MG PO TB24
50.0000 mg | ORAL_TABLET | Freq: Every day | ORAL | Status: DC
  Administered 2023-08-12 – 2023-08-14 (×3): 50 mg via ORAL
  Filled 2023-08-11 (×4): qty 1

## 2023-08-11 MED ORDER — ACETAMINOPHEN 325 MG PO TABS
325.0000 mg | ORAL_TABLET | Freq: Four times a day (QID) | ORAL | Status: DC | PRN
  Administered 2023-08-13: 325 mg via ORAL
  Administered 2023-08-13 – 2023-08-14 (×2): 650 mg via ORAL
  Filled 2023-08-11 (×3): qty 2

## 2023-08-11 MED ORDER — MIDAZOLAM HCL 2 MG/2ML IJ SOLN
INTRAMUSCULAR | Status: AC
  Filled 2023-08-11: qty 2

## 2023-08-11 MED ORDER — ONDANSETRON HCL 4 MG/2ML IJ SOLN
INTRAMUSCULAR | Status: AC
  Filled 2023-08-11: qty 2

## 2023-08-11 MED ORDER — METHOCARBAMOL 500 MG PO TABS
500.0000 mg | ORAL_TABLET | Freq: Four times a day (QID) | ORAL | Status: DC | PRN
  Administered 2023-08-11 – 2023-08-14 (×7): 500 mg via ORAL
  Filled 2023-08-11 (×8): qty 1

## 2023-08-11 MED ORDER — LIDOCAINE 2% (20 MG/ML) 5 ML SYRINGE
INTRAMUSCULAR | Status: AC
  Filled 2023-08-11: qty 5

## 2023-08-11 MED ORDER — HYDROMORPHONE HCL 1 MG/ML IJ SOLN
0.2500 mg | INTRAMUSCULAR | Status: DC | PRN
  Administered 2023-08-11 (×3): 0.5 mg via INTRAVENOUS

## 2023-08-11 SURGICAL SUPPLY — 47 items
BAG COUNTER SPONGE SURGICOUNT (BAG) ×1 IMPLANT
BASEPLATE CMT PS KNEE F 0D LT (Joint) IMPLANT
BLADE SAG 18X100X1.27 (BLADE) ×1 IMPLANT
BNDG ELASTIC 6INX 5YD STR LF (GAUZE/BANDAGES/DRESSINGS) IMPLANT
CEMENT BONE R 1X40 (Cement) IMPLANT
COMPONENT FEM CEMT PRNSA SZ9LT (Knees) IMPLANT
COOLER ICEMAN CLASSIC (MISCELLANEOUS) ×1 IMPLANT
COVER SURGICAL LIGHT HANDLE (MISCELLANEOUS) ×1 IMPLANT
CUFF TRNQT CYL 34X4.125X (TOURNIQUET CUFF) ×1 IMPLANT
DRAPE EXTREMITY T 121X128X90 (DISPOSABLE) ×1 IMPLANT
DRAPE HALF SHEET 40X57 (DRAPES) ×1 IMPLANT
DRAPE U-SHAPE 47X51 STRL (DRAPES) ×1 IMPLANT
DRSG XEROFORM 1X8 (GAUZE/BANDAGES/DRESSINGS) IMPLANT
DURAPREP 26ML APPLICATOR (WOUND CARE) ×1 IMPLANT
ELECT CAUTERY BLADE 6.4 (BLADE) ×1 IMPLANT
ELECTRODE REM PT RTRN 9FT ADLT (ELECTROSURGICAL) ×1 IMPLANT
FACESHIELD WRAPAROUND (MASK) ×3 IMPLANT
FACESHIELD WRAPAROUND OR TEAM (MASK) ×2 IMPLANT
GAUZE PAD ABD 8X10 STRL (GAUZE/BANDAGES/DRESSINGS) IMPLANT
GAUZE SPONGE 4X4 12PLY STRL (GAUZE/BANDAGES/DRESSINGS) IMPLANT
GLOVE BIOGEL PI IND STRL 8 (GLOVE) ×2 IMPLANT
GLOVE ORTHO TXT STRL SZ7.5 (GLOVE) ×1 IMPLANT
GLOVE SURG ORTHO 8.0 STRL STRW (GLOVE) ×1 IMPLANT
GOWN STRL REUS W/ TWL XL LVL3 (GOWN DISPOSABLE) ×2 IMPLANT
IMMOBILIZER KNEE 22 UNIV (SOFTGOODS) ×1 IMPLANT
IV NS 1000ML BAXH (IV SOLUTION) ×1 IMPLANT
KIT BASIN OR (CUSTOM PROCEDURE TRAY) ×1 IMPLANT
KIT TURNOVER KIT B (KITS) ×1 IMPLANT
MANIFOLD NEPTUNE II (INSTRUMENTS) ×1 IMPLANT
NS IRRIG 1000ML POUR BTL (IV SOLUTION) ×1 IMPLANT
PACK TOTAL JOINT (CUSTOM PROCEDURE TRAY) ×1 IMPLANT
PAD ARMBOARD POSITIONER FOAM (MISCELLANEOUS) ×1 IMPLANT
PAD COLD SHLDR WRAP-ON (PAD) ×1 IMPLANT
PADDING CAST COTTON 6X4 STRL (CAST SUPPLIES) IMPLANT
PIN DRILL HDLS TROCAR 75 4PK (PIN) IMPLANT
SCREW FEMALE HEX FIX 25X2.5 (ORTHOPEDIC DISPOSABLE SUPPLIES) IMPLANT
SET HNDPC FAN SPRY TIP SCT (DISPOSABLE) ×1 IMPLANT
SET PAD KNEE POSITIONER (MISCELLANEOUS) ×1 IMPLANT
STEM ARTISURF EF 12 SZ8-11 (Stem) IMPLANT
STEM POLY PAT PLY 32M KNEE (Knees) IMPLANT
SUCTION TUBE FRAZIER 10FR DISP (SUCTIONS) ×1 IMPLANT
SUT VIC AB 0 CT1 27XBRD ANBCTR (SUTURE) ×1 IMPLANT
SUT VIC AB 0 CT1 27XBRD ANTBC (SUTURE) IMPLANT
SUT VIC AB 1 CT1 27XBRD ANBCTR (SUTURE) ×2 IMPLANT
SUT VIC AB 2-0 CT1 TAPERPNT 27 (SUTURE) ×2 IMPLANT
TOWEL GREEN STERILE (TOWEL DISPOSABLE) ×1 IMPLANT
TOWEL GREEN STERILE FF (TOWEL DISPOSABLE) ×1 IMPLANT

## 2023-08-11 NOTE — Anesthesia Procedure Notes (Signed)
 Spinal  Patient location during procedure: OR End time: 08/11/2023 10:48 AM Reason for block: surgical anesthesia Staffing Performed: anesthesiologist  Anesthesiologist: Earvin Goldberg, MD Performed by: Earvin Goldberg, MD Authorized by: Earvin Goldberg, MD   Preanesthetic Checklist Completed: patient identified, IV checked, site marked, risks and benefits discussed, surgical consent, monitors and equipment checked, pre-op evaluation and timeout performed Spinal Block Patient position: sitting Prep: DuraPrep Patient monitoring: heart rate, cardiac monitor, continuous pulse ox and blood pressure Approach: midline Location: L3-4 Injection technique: single-shot Needle Needle type: Sprotte  Needle gauge: 24 G Needle length: 9 cm Assessment Sensory level: T4 Events: CSF return

## 2023-08-11 NOTE — Discharge Instructions (Signed)

## 2023-08-11 NOTE — Interval H&P Note (Signed)
 History and Physical Interval Note: The patient understands that she is here today for a left total knee replacement to treat her severe left knee pain and arthritis.  There has been no acute or interval change in her medical status.  The risks and benefits of surgery have been discussed in detail and informed consent has been obtained.  The left operative knee has been marked.  08/11/2023 9:15 AM  Diane Barker  has presented today for surgery, with the diagnosis of left knee osteoarthritis.  The various methods of treatment have been discussed with the patient and family. After consideration of risks, benefits and other options for treatment, the patient has consented to  Procedure(s): ARTHROPLASTY, KNEE, TOTAL (Left) as a surgical intervention.  The patient's history has been reviewed, patient examined, no change in status, stable for surgery.  I have reviewed the patient's chart and labs.  Questions were answered to the patient's satisfaction.     Arnie Lao

## 2023-08-11 NOTE — Anesthesia Procedure Notes (Signed)
 Anesthesia Regional Block: Adductor canal block   Pre-Anesthetic Checklist: , timeout performed,  Correct Patient, Correct Site, Correct Laterality,  Correct Procedure, Correct Position, site marked,  Risks and benefits discussed,  Surgical consent,  Pre-op evaluation,  At surgeon's request and post-op pain management  Laterality: Left  Prep: chloraprep       Needles:  Injection technique: Single-shot  Needle Type: Stimiplex     Needle Length: 9cm  Needle Gauge: 21     Additional Needles:   Procedures:,,,, ultrasound used (permanent image in chart),,    Narrative:  Start time: 08/11/2023 9:13 AM End time: 08/11/2023 9:18 AM Injection made incrementally with aspirations every 5 mL.  Performed by: Personally  Anesthesiologist: Earvin Goldberg, MD

## 2023-08-11 NOTE — Op Note (Signed)
 Operative Note  Date of operation: 08/11/2023 Preoperative diagnosis: Left knee primary osteoarthritis Postoperative diagnosis: Same  Procedure: Left cemented total knee arthroplasty  Implants: Biomet/Zimmer persona knee system Implant Name Type Inv. Item Serial No. Manufacturer Lot No. LRB No. Used Action  CEMENT BONE R 1X40 - [id number] Cement CEMENT BONE R 1X40  ZIMMER [employer]ON(ORTH,TRAU,BIO,SG) [id number] Left 1 Implanted  CEMENT BONE R 1X40 - [id number] Cement CEMENT BONE R 1X40  ZIMMER [employer]ON(ORTH,TRAU,BIO,SG) [id number] Left 1 Implanted  STEM ARTISURF EF 12 SZ8-11 - [id number] Stem STEM ARTISURF EF 12 SZ8-11  ZIMMER [employer]ON(ORTH,TRAU,BIO,SG) [id number] Left 1 Implanted  BASEPLATE CMT PS KNEE F 0D LT - [id number] Joint BASEPLATE CMT PS KNEE F 0D LT  ZIMMER [employer]ON(ORTH,TRAU,BIO,SG) [id number] Left 1 Implanted  STEM POLY PAT PLY 47M KNEE - [id number] Knees STEM POLY PAT PLY 47M KNEE  ZIMMER [employer]ON(ORTH,TRAU,BIO,SG) [id number] Left 1 Implanted  COMPONENT FEM CEMT PRNSA SZ9LT - [id number] Knees COMPONENT FEM CEMT PRNSA [id number]  ZIMMER [employer]ON(ORTH,TRAU,BIO,SG) [id number] Left 1 Implanted   Surgeon: Jeanella Milan. Lucienne Ryder, MD Assistant: Malena Scull, PA-C  Anesthesia: #1 left lower extremity adductor canal block, #2 spinal, #3 local Tourniquet time: Under 1 hour EBL: Less than 50 cc Complications: None  Indications: The patient is a 73 year old female that we have seen for well over 5 to 6 years now as a relates to knee pain and known arthritis of her left knee.  She has tried and failed all forms of conservative treatment.  At this point her left knee pain is daily and it is definitely affecting her mobility, her quality of life and her actives daily living to the point she does wish to proceed with a knee replacement and we agree with this as well having known her for very long time and given the significance of her left knee pain and arthritis.  We did discuss in length in detail the risks of acute blood  loss anemia, nerve and vessel injury, fracture, infection, DVT, implant failure, wound healing issues.  She understands that our goals are hopefully decreased pain, improved mobility and improved quality of life.  Procedure description: After informed consent was obtained and the appropriate left knee was marked, the patient was brought to the operating room from the holding room.  In the holding room a left lower extremity adductor canal block was obtained by anesthesia.  In the operating room she was placed sitting up on the operating table where spinal anesthesia was obtained.  She was then laid in supine position on the operating table and a Foley catheter is placed.  A nonsterile tractors placed around her upper left thigh and her left thigh, knee, leg, ankle and foot were prepped and draped with DuraPrep including sterile drapes and sterile stockinette.  A timeout was called and she was notified of the correct patient and correct left knee.  An Esmarch was then used to wrap out the leg and the tourniquet was plated to 300 mm pressure.  With the knee extended a direct medial incision was made over the patella and carried proximally distally.  Dissection was carried down to the knee joint and a medial parapatellar arthrotomy is made finding a moderate joint effusion and synovitis.  With the knee in a flexed position we felt complete cartilage wear throughout the knee in all 3 compartments.  We removed large osteophytes of all the compartments as well as remnants of the ACL and medial and lateral meniscus.  We then used an extramedullary cutting guide for making her  proximal tibia cut correcting for varus and valgus and a 7 degree slope.  We made this cut to take 2 mm off the low side.  We then backed it down to more millimeters.  We then on the back to the femur and used an intramedullary based cutting guide for distal femur cut setting this for a left knee at 5 degrees x-ray rotated and a 10 mm distal femoral  cut.  We then brought the knee back down to full extension and we have achieved full extension with a 10 mm block.  We then went back to the femur and put a femoral sizing guide based off the epicondylar axis and Whitesides line.  Based off of this we chose a size 9 femur.  We put a 4-in-1 cutting block for a size 9 femur and made our anterior and posterior cuts followed by her chamfer cuts.  We then backed the tibia and chose a size F left tibial tray for coverage over the tibial plateau setting the rotation of the tibial tubercle and the femur.  We did our drill hole and keel punch off of this.  We then trialed our size F left tibia combined with our size 9 left CR standard femur.  We trialed up to a 12 mm thickness medial congruent left polythene insert and we are pleased with the range of motion and stability with the 12  insert.  We then made our patella cut and drilled 3 holes for a size 32 cemented patella.  Again with all trial instrumentation in the knee we are pleased with range of motion and stability.  We then removed all trial instrumentation with the knee and irrigated the knee with normal saline solution.  We then placed Marcaine  with epinephrine  around the arthrotomy.  Next with the knee in a flexed position we mixed our cement and then cemented our Biomet/Zimmer persona tibial tray for a left knee size F followed by cementing our size 9 left CR standard femur.  We placed our 12 mm thickness left medial congruent polythene insert and cemented our size 32 patella button.  We then held the knee compressed and fully extended while the cemented hardened.  We removed excess cement debris from the knee.  Once the cemented hardened the tourniquet was let down and hemostasis was obtained with electrocautery.  The arthrotomy was then closed with interrupted #1 Vicryl suture followed by 0 Vicryl close deep tissue and 2-0 Vicryl to close subcutaneous tissue.  The skin was closed with staples.  Well-padded  sterile dressings applied.  The patient was taken the recovery room in stable condition.  Malena Scull, PA-C did assist during the entire case and beginning and his assistance was crucial and medically necessary for soft tissue management and retraction, helping guide implant placement and a layered closure of the wound.

## 2023-08-11 NOTE — Plan of Care (Signed)
  Problem: Education: Goal: Knowledge of General Education information will improve Description: Including pain rating scale, medication(s)/side effects and non-pharmacologic comfort measures 08/11/2023 1647 by Gregor Learned, RN Outcome: Progressing 08/11/2023 1647 by Gregor Learned, RN Outcome: Progressing   Problem: Health Behavior/Discharge Planning: Goal: Ability to manage health-related needs will improve Outcome: Progressing   Problem: Clinical Measurements: Goal: Ability to maintain clinical measurements within normal limits will improve Outcome: Progressing Goal: Will remain free from infection Outcome: Progressing Goal: Diagnostic test results will improve Outcome: Progressing Goal: Respiratory complications will improve Outcome: Progressing Goal: Cardiovascular complication will be avoided Outcome: Progressing   Problem: Activity: Goal: Risk for activity intolerance will decrease Outcome: Progressing   Problem: Nutrition: Goal: Adequate nutrition will be maintained Outcome: Progressing   Problem: Coping: Goal: Level of anxiety will decrease Outcome: Progressing   Problem: Elimination: Goal: Will not experience complications related to bowel motility Outcome: Progressing Goal: Will not experience complications related to urinary retention Outcome: Progressing   Problem: Pain Managment: Goal: General experience of comfort will improve and/or be controlled Outcome: Progressing   Problem: Safety: Goal: Ability to remain free from injury will improve Outcome: Progressing   Problem: Skin Integrity: Goal: Risk for impaired skin integrity will decrease Outcome: Progressing   Problem: Education: Goal: Knowledge of the prescribed therapeutic regimen will improve Outcome: Progressing Goal: Individualized Educational Video(s) Outcome: Progressing   Problem: Activity: Goal: Ability to avoid complications of mobility impairment will improve Outcome:  Progressing Goal: Range of joint motion will improve Outcome: Progressing   Problem: Clinical Measurements: Goal: Postoperative complications will be avoided or minimized Outcome: Progressing   Problem: Pain Management: Goal: Pain level will decrease with appropriate interventions Outcome: Progressing   Problem: Skin Integrity: Goal: Will show signs of wound healing Outcome: Progressing

## 2023-08-11 NOTE — Evaluation (Signed)
 Physical Therapy Evaluation Patient Details Name: Diane Barker MRN: [id number] DOB: Jun 18, 1950 Today's Date: 08/11/2023  History of Present Illness  Pt is 73 yo female s/p L TKA on 08/11/23.  Pt with hx including but not limited to arthritis, unstable angina, HLD, DVT, PE, low back pain, breast CA s/p lumpectomy and chemo, anxiety, HTN  Clinical Impression  Pt is s/p TKA resulting in the deficits listed below (see PT Problem List). At baseline, pt is independent and lives alone.  Does report increased difficulty with mobility lately due to pain but still independent.  Today, pt with good quad activation, pain control, and ROM.  She was able to transfer with min A and ambulated 46' with RW and CGA.  Pt expected to progress well. Pt will benefit from acute skilled PT to increase their independence and safety with mobility to allow discharge.  Pt does report she would like to go to SNF at d/c since she lives alone - reports she has told Careers adviser.           If plan is discharge home, recommend the following: A little help with walking and/or transfers;A little help with bathing/dressing/bathroom;Assistance with cooking/housework;Help with stairs or ramp for entrance   Can travel by private vehicle        Equipment Recommendations Rolling walker (2 wheels)  Recommendations for Other Services       Functional Status Assessment Patient has had a recent decline in their functional status and demonstrates the ability to make significant improvements in function in a reasonable and predictable amount of time.     Precautions / Restrictions Precautions Precautions: Fall;Knee Required Braces or Orthoses: Knee Immobilizer - Left Knee Immobilizer - Left: Discontinue once straight leg raise with < 10 degree lag Restrictions LLE Weight Bearing Per Provider Order: Weight bearing as tolerated      Mobility  Bed Mobility Overal bed mobility: Needs Assistance Bed Mobility: Supine to Sit     Supine  to sit: Supervision, HOB elevated          Transfers Overall transfer level: Needs assistance Equipment used: Rolling walker (2 wheels) Transfers: Sit to/from Stand Sit to Stand: Contact guard assist           General transfer comment: CGA for safety; good hand placement; min cues for L LE management    Ambulation/Gait Ambulation/Gait assistance: Contact guard assist Gait Distance (Feet): 60 Feet Assistive device: Rolling walker (2 wheels) Gait Pattern/deviations: Step-to pattern, Decreased stride length, Decreased weight shift to left Gait velocity: decreased     General Gait Details: Pt preferring to step with R LE first, min cues for RW proximity  Stairs            Wheelchair Mobility     Tilt Bed    Modified Rankin (Stroke Patients Only)       Balance Overall balance assessment: Needs assistance Sitting-balance support: No upper extremity supported Sitting balance-Leahy Scale: Good     Standing balance support: Bilateral upper extremity supported, Reliant on assistive device for balance Standing balance-Leahy Scale: Poor Standing balance comment: steady with RW                             Pertinent Vitals/Pain Pain Assessment Pain Assessment: 0-10 Pain Score: 4  Pain Location: L knee Pain Descriptors / Indicators: Discomfort Pain Intervention(s): Limited activity within patient's tolerance, Monitored during session, Premedicated before session, Repositioned, Ice applied  Home Living Family/patient expects to be discharged to:: Private residence Living Arrangements: Alone Available Help at Discharge: Family;Available PRN/intermittently Type of Home: House Home Access: Stairs to enter Entrance Stairs-Rails: None Entrance Stairs-Number of Steps: 3   Home Layout: One level Home Equipment: None      Prior Function Prior Level of Function : Independent/Modified Independent             Mobility Comments: Reports  ambulatory without AD; does report difficulty due to pain lately with community ambulation ADLs Comments: Reports independent with adls and iadls although have been more difficult the past few weeks due to knee pain     Extremity/Trunk Assessment   Upper Extremity Assessment Upper Extremity Assessment: Overall WFL for tasks assessed    Lower Extremity Assessment Lower Extremity Assessment: LLE deficits/detail;RLE deficits/detail RLE Deficits / Details: ROM WFL; MMT 5/5 RLE Sensation: WNL LLE Deficits / Details: Expected post op changes; ROM: knee ~5 to 80 degrees; MMT: ankle 5/5, knee 3/5, hip 3/5 ;able to SLR LLE Sensation: WNL    Cervical / Trunk Assessment Cervical / Trunk Assessment: Normal  Communication        Cognition Arousal: Alert Behavior During Therapy: WFL for tasks assessed/performed   PT - Cognitive impairments: No apparent impairments                                 Cueing       General Comments General comments (skin integrity, edema, etc.): VSS; Educated on resting wtih leg straight/no pillow under knee; also encouraged ankle pumps and quad sets tonight    Exercises Total Joint Exercises Ankle Circles/Pumps: AROM, Both, 10 reps, Supine Quad Sets: AROM, Left, 5 reps, Supine   Assessment/Plan    PT Assessment Patient needs continued PT services  PT Problem List Decreased strength;Pain;Decreased range of motion;Decreased activity tolerance;Decreased balance;Decreased mobility;Decreased knowledge of use of DME       PT Treatment Interventions DME instruction;Therapeutic exercise;Gait training;Balance training;Stair training;Functional mobility training;Therapeutic activities;Patient/family education;Modalities    PT Goals (Current goals can be found in the Care Plan section)  Acute Rehab PT Goals Patient Stated Goal: pt reports go to rehab since she lives alone PT Goal Formulation: With patient Time For Goal Achievement:  08/25/23 Potential to Achieve Goals: Good    Frequency 7X/week     Co-evaluation               AM-PAC PT "6 Clicks" Mobility  Outcome Measure Help needed turning from your back to your side while in a flat bed without using bedrails?: A Little Help needed moving from lying on your back to sitting on the side of a flat bed without using bedrails?: A Little Help needed moving to and from a bed to a chair (including a wheelchair)?: A Little Help needed standing up from a chair using your arms (e.g., wheelchair or bedside chair)?: A Little Help needed to walk in hospital room?: A Little Help needed climbing 3-5 steps with a railing? : A Little 6 Click Score: 18    End of Session Equipment Utilized During Treatment: Gait belt Activity Tolerance: Patient tolerated treatment well Patient left: in chair;with call bell/phone within reach (on 3C, no alarms; pt following commands) Nurse Communication: Mobility status PT Visit Diagnosis: Other abnormalities of gait and mobility (R26.89);Muscle weakness (generalized) (M62.81)    Time: 4098-1191 PT Time Calculation (min) (ACUTE ONLY): 27 min   Charges:  PT Evaluation $PT Eval Low Complexity: 1 Low PT Treatments $Gait Training: 8-22 mins PT General Charges $$ ACUTE PT VISIT: 1 Visit         Cyd Dowse, PT Acute Rehab [facility] [phone]   Carolynn Citrin 08/11/2023, 5:21 PM

## 2023-08-11 NOTE — Transfer of Care (Signed)
 Immediate Anesthesia Transfer of Care Note  Patient: Diane Barker  Procedure(s) Performed: LEFT TOTAL KNEE ARTHROPLASTY (Left: Knee)  Patient Location: PACU  Anesthesia Type:Spinal  Level of Consciousness: awake, alert , and oriented  Airway & Oxygen Therapy: Patient Spontanous Breathing  Post-op Assessment: Report given to RN and Post -op Vital signs reviewed and stable  Post vital signs: Reviewed and stable  Last Vitals:  Vitals Value Taken Time  BP 114/59 08/11/23 1245  Temp    Pulse 71 08/11/23 1248  Resp 15 08/11/23 1248  SpO2 95 % 08/11/23 1248  Vitals shown include unfiled device data.  Last Pain:  Vitals:   08/11/23 0924  TempSrc:   PainSc: 0-No pain      Patients Stated Pain Goal: 0 (08/11/23 0924)  Complications: No notable events documented.

## 2023-08-11 NOTE — Anesthesia Postprocedure Evaluation (Signed)
 Anesthesia Post Note  Patient: Diane Barker  Procedure(s) Performed: LEFT TOTAL KNEE ARTHROPLASTY (Left: Knee)     Patient location during evaluation: PACU Anesthesia Type: MAC Level of consciousness: awake and alert Pain management: pain level controlled Vital Signs Assessment: post-procedure vital signs reviewed and stable Respiratory status: spontaneous breathing, nonlabored ventilation and respiratory function stable Cardiovascular status: blood pressure returned to baseline and stable Postop Assessment: no apparent nausea or vomiting Anesthetic complications: no   No notable events documented.  Last Vitals:  Vitals:   08/11/23 1415 08/11/23 1430  BP: 126/67 122/64  Pulse: 60 (!) 58  Resp: 15 12  Temp:  36.4 C  SpO2: 98% 98%    Last Pain:  Vitals:   08/11/23 1415  TempSrc:   PainSc: 5    Pain Goal: Patients Stated Pain Goal: 0 (08/11/23 0924)  LLE Motor Response: Purposeful movement, Responds to commands (08/11/23 1415)   RLE Motor Response: Purposeful movement, Responds to commands (08/11/23 1415)   L Sensory Level: S1-Sole of foot, small toes (08/11/23 1415) R Sensory Level: S1-Sole of foot, small toes (08/11/23 1415) Epidural/Spinal Function Cutaneous sensation: Able to Wiggle Toes (08/11/23 1415), Patient able to flex knees: Yes (08/11/23 1415), Patient able to lift hips off bed: Yes (08/11/23 1415), Back pain beyond tenderness at insertion site: No (08/11/23 1415), Progressively worsening motor and/or sensory loss: No (08/11/23 1415), Bowel and/or bladder incontinence post epidural: No (08/11/23 1415)  Earvin Goldberg

## 2023-08-12 ENCOUNTER — Encounter (HOSPITAL_COMMUNITY): Payer: Self-pay | Admitting: Orthopaedic Surgery

## 2023-08-12 DIAGNOSIS — M1712 Unilateral primary osteoarthritis, left knee: Secondary | ICD-10-CM | POA: Diagnosis not present

## 2023-08-12 LAB — CBC
HCT: 36.6 % (ref 36.0–46.0)
Hemoglobin: 11.9 g/dL — ABNORMAL LOW (ref 12.0–15.0)
MCH: 33 pg (ref 26.0–34.0)
MCHC: 32.5 g/dL (ref 30.0–36.0)
MCV: 101.4 fL — ABNORMAL HIGH (ref 80.0–100.0)
Platelets: 220 10*3/uL (ref 150–400)
RBC: 3.61 MIL/uL — ABNORMAL LOW (ref 3.87–5.11)
RDW: 12.6 % (ref 11.5–15.5)
WBC: 10.3 10*3/uL (ref 4.0–10.5)
nRBC: 0 % (ref 0.0–0.2)

## 2023-08-12 LAB — BASIC METABOLIC PANEL WITH GFR
Anion gap: 6 (ref 5–15)
BUN: 13 mg/dL (ref 8–23)
CO2: 23 mmol/L (ref 22–32)
Calcium: 8.7 mg/dL — ABNORMAL LOW (ref 8.9–10.3)
Chloride: 108 mmol/L (ref 98–111)
Creatinine, Ser: 1.01 mg/dL — ABNORMAL HIGH (ref 0.44–1.00)
GFR, Estimated: 59 mL/min — ABNORMAL LOW (ref >60)
Glucose, Bld: 148 mg/dL — ABNORMAL HIGH (ref 70–99)
Potassium: 4.3 mmol/L (ref 3.5–5.1)
Sodium: 137 mmol/L (ref 135–145)

## 2023-08-12 MED ORDER — ORAL CARE MOUTH RINSE: 15.0000 mL | OROMUCOSAL | Status: DC | PRN

## 2023-08-12 NOTE — Progress Notes (Signed)
 Physical Therapy Treatment Patient Details Name: Diane Barker MRN: [id number] DOB: [birth year] Today's Date: 08/12/2023   History of Present Illness Pt is 73 yo female s/p L TKA on 08/11/23.  Pt with hx including but not limited to arthritis, unstable angina, HLD, DVT, PE, low back pain, breast CA s/p lumpectomy and chemo, anxiety, HTN    PT Comments  Pt seen for PT tx with pt received asleep but awakened, pt agreeable to tx with encouragement but only bed level exercises as pt noting significant pain following AM session. Pt required AAROM & cuing for technique for LLE strengthening/ROM exercises. PT also educated pt on need to promote L knee extension & positioning of pillows under LLE. Will continue to follow pt acutely to progress mobility as able.    If plan is discharge home, recommend the following: A little help with walking and/or transfers;A little help with bathing/dressing/bathroom;Assistance with cooking/housework;Help with stairs or ramp for entrance   Can travel by private vehicle        Equipment Recommendations  Rolling walker (2 wheels)    Recommendations for Other Services       Precautions / Restrictions Precautions Precautions: Fall;Knee Required Braces or Orthoses: Knee Immobilizer - Left Knee Immobilizer - Left: Discontinue once straight leg raise with < 10 degree lag Restrictions Weight Bearing Restrictions Per Provider Order: Yes LLE Weight Bearing Per Provider Order: Weight bearing as tolerated     Mobility  Bed Mobility                    Transfers                        Ambulation/Gait                   Stairs             Wheelchair Mobility     Tilt Bed    Modified Rankin (Stroke Patients Only)       Balance                                            Communication Communication Communication: No apparent difficulties  Cognition Arousal:  (somewhat drowsy, possibly 2/2 pain meds  (also c/o feeling "hot")) Behavior During Therapy: WFL for tasks assessed/performed   PT - Cognitive impairments: No apparent impairments                         Following commands: Intact      Cueing Cueing Techniques: Verbal cues, Visual cues  Exercises Total Joint Exercises Quad Sets: AROM, Left, 5 reps Short Arc Quad: AROM, Supine, Strengthening, Left, 10 reps Heel Slides: AAROM, Supine, Strengthening, Left, 10 reps Hip ABduction/ADduction: Supine, AAROM, Strengthening, Left, 10 reps (hip abduction slides) Straight Leg Raises: AAROM, Supine, Strengthening, Left, 10 reps    General Comments General comments (skin integrity, edema, etc.): does better with shoes on      Pertinent Vitals/Pain Pain Assessment Pain Assessment: Faces Faces Pain Scale: Hurts whole lot Pain Location: L knee Pain Descriptors / Indicators: Discomfort, Grimacing, Guarding Pain Intervention(s): Monitored during session, Limited activity within patient's tolerance (notified nurse of pt's ongoing c/o pain)    Home Living  Prior Function            PT Goals (current goals can now be found in the care plan section) Acute Rehab PT Goals Patient Stated Goal: pt reports go to rehab since she lives alone PT Goal Formulation: With patient Time For Goal Achievement: 08/25/23 Potential to Achieve Goals: Good Progress towards PT goals: Progressing toward goals    Frequency    7X/week      PT Plan      Co-evaluation              AM-PAC PT "6 Clicks" Mobility   Outcome Measure  Help needed turning from your back to your side while in a flat bed without using bedrails?: A Little Help needed moving from lying on your back to sitting on the side of a flat bed without using bedrails?: A Little Help needed moving to and from a bed to a chair (including a wheelchair)?: A Little Help needed standing up from a chair using your arms (e.g., wheelchair or  bedside chair)?: A Little Help needed to walk in hospital room?: A Little Help needed climbing 3-5 steps with a railing? : A Little 6 Click Score: 18    End of Session Equipment Utilized During Treatment: Gait belt Activity Tolerance: Patient limited by pain Patient left: in bed;with call bell/phone within reach;with bed alarm set Nurse Communication: Mobility status (c/o feeling "hot", ?drowsy possibly 2/2 pain meds) PT Visit Diagnosis: Other abnormalities of gait and mobility (R26.89);Muscle weakness (generalized) (M62.81);Pain Pain - Right/Left: Left Pain - part of body: Knee     Time: 1610-9604 PT Time Calculation (min) (ACUTE ONLY): 12 min  Charges:     $Therapeutic Exercise: 8-22 mins PT General Charges $$ ACUTE PT VISIT: 1 Visit                     Emaline Handsome, PT, DPT 08/12/23, 2:04 PM   Venetta Gill 08/12/2023, 2:03 PM

## 2023-08-12 NOTE — TOC Initial Note (Signed)
 Transition of Care [facility]) - Initial/Assessment Note    Patient Details  Name: Diane Barker MRN: [id number] Date of Birth: [birth year]  Transition of Care [facility]) CM/SW Contact:    Elspeth Hals, LCSW Phone Number: 08/12/2023, 1:18 PM  Clinical Narrative:      CSW met with pt regarding PT recommendation/MD consult for SNF.  Pt is agreeable, requesting [facility], permission given to send out referral to [facility].  Pt from home alone, no current services.  Permission given to speak with daughters Alston Jerry and Edwina Gram.  Referral sent to [facility]. PASSR requires additional info.              Expected Discharge Plan: Skilled Nursing Facility Barriers to Discharge: Continued Medical Work up, SNF Pending bed offer   Patient Goals and CMS Choice Patient states their goals for this hospitalization and ongoing recovery are:: do the things I normally do   Choice offered to / list presented to : Patient (pt requesting [facility])      Expected Discharge Plan and Services In-house Referral: Clinical Social Work   Post Acute Care Choice: Skilled Nursing Facility Living arrangements for the past 2 months: Single Family Home                                      Prior Living Arrangements/Services Living arrangements for the past 2 months: Single Family Home Lives with:: Self Patient language and need for interpreter reviewed:: Yes Do you feel safe going back to the place where you live?: Yes      Need for Family Participation in Patient Care: No (Comment) Care giver support system in place?: Yes (comment) Current home services: Other (comment) (none) Criminal Activity/Legal Involvement Pertinent to Current Situation/Hospitalization: No - Comment as needed  Activities of Daily Living   ADL Screening (condition at time of admission) Independently performs ADLs?: Yes (appropriate for developmental age) Is the patient deaf or have difficulty hearing?: No Does the patient  have difficulty seeing, even when wearing glasses/contacts?: No Does the patient have difficulty concentrating, remembering, or making decisions?: Yes  Permission Sought/Granted Permission sought to share information with : Family Supports Permission granted to share information with : Yes, Verbal Permission Granted  Share Information with NAME: daughters Alston Jerry and Edwina Gram  Permission granted to share info w AGENCY: [facility]        Emotional Assessment Appearance:: Appears stated age Attitude/Demeanor/Rapport: Engaged Affect (typically observed): Appropriate, Pleasant Orientation: : Oriented to Self, Oriented to Place, Oriented to  Time, Oriented to Situation      Admission diagnosis:  Primary osteoarthritis of left knee [M17.12] Status post total left knee replacement [Z96.652] Patient Active Problem List   Diagnosis Date Noted   Status post total left knee replacement 08/11/2023   Unilateral primary osteoarthritis, left knee 08/10/2023   Hyperlipidemia 08/01/2020   Asthmatic bronchitis, mild persistent, uncomplicated    Unstable angina (HCC) 01/16/2018   Cellulitis of right arm 10/07/2016   Sepsis (HCC) 10/07/2016   Chest pain 10/07/2016   Dyspnea on exertion 05/24/2015   DVT, lower extremity (HCC) 05/09/2014   Right calf pain 06/21/2013   Pulmonary embolism (HCC) 06/21/2013   Dysphagia 06/27/2011   Cough 06/27/2011   CHEST PAIN 01/18/2009   RHINITIS 04/13/2007   Obstructive chronic bronchitis with acute bronchitis (HCC) 04/13/2007   GERD 04/13/2007   Depression 01/21/2007   OSTEOARTHRITIS 01/21/2007   LOW BACK PAIN 01/21/2007  BREAST CANCER, HX OF 01/21/2007   PCP:  Margarete Sharps, MD Pharmacy:   [phone] - [place], [place] - [address] AT [facility] [address] [place] [address] Phone: [phone] Fax: [phone]  [id number] [place], [place] - [address] AT [facility] [address]  [place] [address] Phone: [phone] Fax: [phone]     Social Drivers of Health (SDOH) Social History: SDOH Screenings   Food Insecurity: No Food Insecurity (08/12/2023)  Housing: Low Risk  (08/12/2023)  Transportation Needs: No Transportation Needs (08/12/2023)  Utilities: Not At Risk (08/12/2023)  Depression (PHQ2-9): Low Risk  (05/03/2018)  Social Connections: Moderately Integrated (08/12/2023)  Tobacco Use: Low Risk  (08/11/2023)   SDOH Interventions:     Readmission Risk Interventions     No data to display

## 2023-08-12 NOTE — Progress Notes (Signed)
 RE:  Diane Barker       Date of Birth:  03/27/1950     Date:   08/12/23       To Whom It May Concern:  Please be advised that the above-named patient will require a short-term nursing home stay - anticipated 30 days or less for rehabilitation and strengthening.  The plan is for return home.                 MD signature                Date

## 2023-08-12 NOTE — Plan of Care (Signed)
  Problem: Education: Goal: Knowledge of General Education information will improve Description: Including pain rating scale, medication(s)/side effects and non-pharmacologic comfort measures Outcome: Progressing   Problem: Clinical Measurements: Goal: Ability to maintain clinical measurements within normal limits will improve Outcome: Progressing Goal: Will remain free from infection Outcome: Progressing Goal: Diagnostic test results will improve Outcome: Progressing Goal: Respiratory complications will improve Outcome: Progressing Goal: Cardiovascular complication will be avoided Outcome: Progressing   Problem: Activity: Goal: Risk for activity intolerance will decrease Outcome: Progressing   Problem: Nutrition: Goal: Adequate nutrition will be maintained Outcome: Progressing   Problem: Coping: Goal: Level of anxiety will decrease Outcome: Progressing   Problem: Elimination: Goal: Will not experience complications related to bowel motility Outcome: Progressing Goal: Will not experience complications related to urinary retention Outcome: Progressing   Problem: Pain Managment: Goal: General experience of comfort will improve and/or be controlled Outcome: Progressing   Problem: Safety: Goal: Ability to remain free from injury will improve Outcome: Progressing   Problem: Skin Integrity: Goal: Risk for impaired skin integrity will decrease Outcome: Progressing   Problem: Education: Goal: Knowledge of the prescribed therapeutic regimen will improve Outcome: Progressing   Problem: Activity: Goal: Ability to avoid complications of mobility impairment will improve Outcome: Progressing Goal: Range of joint motion will improve Outcome: Progressing   Problem: Clinical Measurements: Goal: Postoperative complications will be avoided or minimized Outcome: Progressing   Problem: Pain Management: Goal: Pain level will decrease with appropriate interventions Outcome:  Progressing   Problem: Skin Integrity: Goal: Will show signs of wound healing Outcome: Progressing

## 2023-08-12 NOTE — Plan of Care (Signed)
   Problem: Education: Goal: Knowledge of General Education information will improve Description: Including pain rating scale, medication(s)/side effects and non-pharmacologic comfort measures Outcome: Progressing   Problem: Activity: Goal: Risk for activity intolerance will decrease Outcome: Progressing   Problem: Pain Managment: Goal: General experience of comfort will improve and/or be controlled Outcome: Progressing

## 2023-08-12 NOTE — NC FL2 (Signed)
 [place]  MEDICAID FL2 LEVEL OF CARE FORM     IDENTIFICATION  Patient Name: Diane Barker Birthdate: 1951/02/25 Sex: female Admission Date (Current Location): 08/11/2023  [place] and [place] Number:  Ernesto Heady [id number] F Facility and Address:  [facility], [address], [place], [place] [address]      Provider Number: [id number]  Attending Physician Name and Address:  Arnie Lao,*  Relative Name and Phone Number:  Rachele Buddy Daughter   [phone]    Current Level of Care: Hospital Recommended Level of Care: Skilled Nursing Facility Prior Approval Number:    Date Approved/Denied:   PASRR Number:    Discharge Plan: SNF    Current Diagnoses: Patient Active Problem List   Diagnosis Date Noted   Status post total left knee replacement 08/11/2023   Unilateral primary osteoarthritis, left knee 08/10/2023   Hyperlipidemia 08/01/2020   Asthmatic bronchitis, mild persistent, uncomplicated    Unstable angina (HCC) 01/16/2018   Cellulitis of right arm 10/07/2016   Sepsis (HCC) 10/07/2016   Chest pain 10/07/2016   Dyspnea on exertion 05/24/2015   DVT, lower extremity (HCC) 05/09/2014   Right calf pain 06/21/2013   Pulmonary embolism (HCC) 06/21/2013   Dysphagia 06/27/2011   Cough 06/27/2011   CHEST PAIN 01/18/2009   RHINITIS 04/13/2007   Obstructive chronic bronchitis with acute bronchitis (HCC) 04/13/2007   GERD 04/13/2007   Depression 01/21/2007   OSTEOARTHRITIS 01/21/2007   LOW BACK PAIN 01/21/2007   BREAST CANCER, HX OF 01/21/2007    Orientation RESPIRATION BLADDER Height & Weight     Self, Time, Situation, Place  Normal Continent Weight: 230 lb (104.3 kg) Height:  5\' 5"  (165.1 cm)  BEHAVIORAL SYMPTOMS/MOOD NEUROLOGICAL BOWEL NUTRITION STATUS      Continent Diet (see discharge summary)  AMBULATORY STATUS COMMUNICATION OF NEEDS Skin   Limited Assist Verbally Surgical wounds                       Personal Care  Assistance Level of Assistance  Bathing, Feeding, Dressing Bathing Assistance: Limited assistance Feeding assistance: Independent       Functional Limitations Info  Sight, Hearing, Speech Sight Info: Adequate Hearing Info: Adequate Speech Info: Adequate    SPECIAL CARE FACTORS FREQUENCY  PT (By licensed PT), OT (By licensed OT)     PT Frequency: 5x week OT Frequency: 5x week            Contractures Contractures Info: Not present    Additional Factors Info  Code Status, Allergies Code Status Info: full Allergies Info: E.e.s. (Erythromycin), Lipitor (Atorvastatin ), Morphine And Codeine , Neurontin (Gabapentin), Omnicef  (Cefdinir ), Penicillins, Relafen (Nabumetone)           Current Medications (08/12/2023):  This is the current hospital active medication list Current Facility-Administered Medications  Medication Dose Route Frequency Provider Last Rate Last Admin   0.9 %  sodium chloride  infusion   Intravenous Continuous Arnie Lao, MD   Stopped at 08/12/23 0510   acetaminophen  (TYLENOL ) tablet 325-650 mg  325-650 mg Oral Q6H PRN Arnie Lao, MD       albuterol  (PROVENTIL ) (2.5 MG/3ML) 0.083% nebulizer solution 2.5 mg  2.5 mg Inhalation Q4H PRN Arnie Lao, MD       alum & mag hydroxide-simeth (MAALOX/MYLANTA) 200-200-20 MG/5ML suspension 30 mL  30 mL Oral Q4H PRN Arnie Lao, MD       amLODipine  (NORVASC ) tablet 2.5 mg  2.5 mg Oral Daily Norberto Bear  Y, MD   2.5 mg at 08/12/23 0846   atorvastatin  (LIPITOR) tablet 40 mg  40 mg Oral Daily Arnie Lao, MD   40 mg at 08/12/23 0846   cholecalciferol  (VITAMIN D3) 25 MCG (1000 UNIT) tablet 2,000 Units  2,000 Units Oral q AM Arnie Lao, MD   2,000 Units at 08/12/23 0845   diphenhydrAMINE (BENADRYL) 12.5 MG/5ML elixir 12.5-25 mg  12.5-25 mg Oral Q4H PRN Arnie Lao, MD       docusate sodium (COLACE) capsule 100 mg  100 mg Oral BID Arnie Lao, MD   100 mg at 08/12/23 1610   escitalopram  (LEXAPRO ) tablet 10 mg  10 mg Oral q AM Arnie Lao, MD   10 mg at 08/12/23 9604   HYDROmorphone  (DILAUDID ) injection 0.5-1 mg  0.5-1 mg Intravenous Q4H PRN Arnie Lao, MD       isosorbide  mononitrate (IMDUR ) 24 hr tablet 60 mg  60 mg Oral q AM Arnie Lao, MD   60 mg at 08/12/23 0845   menthol-cetylpyridinium (CEPACOL) lozenge 3 mg  1 lozenge Oral PRN Arnie Lao, MD       Or   phenol (CHLORASEPTIC) mouth spray 1 spray  1 spray Mouth/Throat PRN Arnie Lao, MD       methocarbamol  (ROBAXIN ) tablet 500 mg  500 mg Oral Q6H PRN Arnie Lao, MD   500 mg at 08/12/23 5409   Or   methocarbamol  (ROBAXIN ) injection 500 mg  500 mg Intravenous Q6H PRN Arnie Lao, MD       metoCLOPramide (REGLAN) tablet 5-10 mg  5-10 mg Oral Q8H PRN Arnie Lao, MD       Or   metoCLOPramide (REGLAN) injection 5-10 mg  5-10 mg Intravenous Q8H PRN Arnie Lao, MD       mirabegron [facility]) tablet 50 mg  50 mg Oral Daily Blackman, Christopher Y, MD   50 mg at 08/12/23 8119   ondansetron  (ZOFRAN ) tablet 4 mg  4 mg Oral Q6H PRN Arnie Lao, MD       Or   ondansetron  (ZOFRAN ) injection 4 mg  4 mg Intravenous Q6H PRN Arnie Lao, MD       Oral care mouth rinse  15 mL Mouth Rinse PRN Arnie Lao, MD       oxyCODONE  (Oxy IR/ROXICODONE ) immediate release tablet 10-15 mg  10-15 mg Oral Q4H PRN Arnie Lao, MD   15 mg at 08/12/23 0845   oxyCODONE  (Oxy IR/ROXICODONE ) immediate release tablet 5-10 mg  5-10 mg Oral Q4H PRN Arnie Lao, MD   10 mg at 08/12/23 0408   pantoprazole  (PROTONIX ) EC tablet 40 mg  40 mg Oral Daily Blackman, Christopher Y, MD   40 mg at 08/12/23 1478   rivaroxaban  (XARELTO ) tablet 20 mg  20 mg Oral Q breakfast Arnie Lao, MD   20 mg at 08/12/23 2956     Discharge  Medications: Please see discharge summary for a list of discharge medications.  Relevant Imaging Results:  Relevant Lab Results:   Additional Information SSN: [id number]  Elspeth Hals, LCSW

## 2023-08-12 NOTE — Progress Notes (Addendum)
 Physical Therapy Treatment Patient Details Name: Diane Barker MRN: [id number] DOB: 1950-08-31 Today's Date: 08/12/2023   History of Present Illness Pt is 72 yo female s/p L TKA on 08/11/23.  Pt with hx including but not limited to arthritis, unstable angina, HLD, DVT, PE, low back pain, breast CA s/p lumpectomy and chemo, anxiety, HTN    PT Comments  Continuing work on functional mobility and activity tolerance;  Session focused on progressive amb without KI and monitor for L knee stability in stance; no overt buckling noted; Pt tell s me she feels better walking in her shoes; Anticipate good progress at post-acute rehab    If plan is discharge home, recommend the following: A little help with walking and/or transfers;A little help with bathing/dressing/bathroom;Assistance with cooking/housework;Help with stairs or ramp for entrance   Can travel by private vehicle        Equipment Recommendations  Rolling walker (2 wheels)    Recommendations for Other Services       Precautions / Restrictions Precautions Precautions: Fall;Knee Precaution/Restrictions Comments: Pt educated to not allow any pillow or bolster under knee for healing with optimal range of motion.  Required Braces or Orthoses: Knee Immobilizer - Left (no longer needed) Restrictions LLE Weight Bearing Per Provider Order: Weight bearing as tolerated     Mobility  Bed Mobility Overal bed mobility: Needs Assistance Bed Mobility: Supine to Sit     Supine to sit: Supervision          Transfers Overall transfer level: Needs assistance Equipment used: Rolling walker (2 wheels) Transfers: Sit to/from Stand Sit to Stand: Contact guard assist           General transfer comment: CGA for safety; good hand placement; min cues for L LE management    Ambulation/Gait Ambulation/Gait assistance: Contact guard assist Gait Distance (Feet): 60 Feet Assistive device: Rolling walker (2 wheels) Gait Pattern/deviations:  Step-through pattern (emerging) Gait velocity: decreased     General Gait Details: Cues for sequence and to use RW to offload painful LLE in stance; no overt buckling noted   Stairs             Wheelchair Mobility     Tilt Bed    Modified Rankin (Stroke Patients Only)       Balance     Sitting balance-Leahy Scale: Good       Standing balance-Leahy Scale: Poor                              Communication Communication Communication: No apparent difficulties  Cognition Arousal: Alert Behavior During Therapy: WFL for tasks assessed/performed   PT - Cognitive impairments: No apparent impairments                         Following commands: Intact      Cueing Cueing Techniques: Verbal cues, Visual cues  Exercises Total Joint Exercises Ankle Circles/Pumps: AROM, Both, 10 reps, Supine Quad Sets: AROM, Left, 5 reps, Supine Short Arc Quad: AROM, Left, 10 reps    General Comments General comments (skin integrity, edema, etc.): does better with shoes on      Pertinent Vitals/Pain Pain Assessment Pain Assessment: 0-10 Pain Score: 7  Pain Location: L knee Pain Descriptors / Indicators: Discomfort Pain Intervention(s): Monitored during session    Home Living  Prior Function            PT Goals (current goals can now be found in the care plan section) Acute Rehab PT Goals Patient Stated Goal: pt reports go to rehab since she lives alone PT Goal Formulation: With patient Time For Goal Achievement: 08/25/23 Potential to Achieve Goals: Good Progress towards PT goals: Progressing toward goals    Frequency    7X/week      PT Plan      Co-evaluation              AM-PAC PT "6 Clicks" Mobility   Outcome Measure  Help needed turning from your back to your side while in a flat bed without using bedrails?: A Little Help needed moving from lying on your back to sitting on the side of a  flat bed without using bedrails?: A Little Help needed moving to and from a bed to a chair (including a wheelchair)?: A Little Help needed standing up from a chair using your arms (e.g., wheelchair or bedside chair)?: A Little Help needed to walk in hospital room?: A Little Help needed climbing 3-5 steps with a railing? : A Little 6 Click Score: 18    End of Session Equipment Utilized During Treatment: Gait belt Activity Tolerance: Patient tolerated treatment well Patient left: with call bell/phone within reach;Other (comment) (on commode) Nurse Communication: Mobility status PT Visit Diagnosis: Other abnormalities of gait and mobility (R26.89);Muscle weakness (generalized) (M62.81)     Time: 9629-5284 PT Time Calculation (min) (ACUTE ONLY): 38 min  Charges:    $Gait Training: 23-37 mins $Therapeutic Activity: 8-22 mins PT General Charges $$ ACUTE PT VISIT: 1 Visit                     Darcus Eastern, PT  Acute Rehabilitation Services Office [phone] Secure Chat welcomed    Marcial Setting 08/12/2023, 1:54 PM

## 2023-08-12 NOTE — Progress Notes (Addendum)
 Subjective: 1 Day Post-Op Procedure(s) (LRB): LEFT TOTAL KNEE ARTHROPLASTY (Left) Patient reports pain as severe.  States having a lot of pain in her thigh and behind knee. Also some pain in plantar aspect of left foot.   Objective: Vital signs in last 24 hours: Temp:  [97.6 F (36.4 C)-99.5 F (37.5 C)] 98.2 F (36.8 C) (05/28 0521) Pulse Rate:  [58-82] 80 (05/28 0521) Resp:  [9-20] 20 (05/28 0521) BP: (95-140)/(54-73) 140/65 (05/28 0521) SpO2:  [94 %-100 %] 96 % (05/28 0521) Weight:  [104.3 kg] 104.3 kg (05/27 0808)  Intake/Output from previous day: 05/27 0701 - 05/28 0700 In: 2527.5 [P.O.:960; I.V.:1567.5] Out: 1800 [Urine:1750; Blood:50] Intake/Output this shift: No intake/output data recorded.  Recent Labs    08/12/23 0526  HGB 11.9*   Recent Labs    08/12/23 0526  WBC 10.3  RBC 3.61*  HCT 36.6  PLT 220   Recent Labs    08/12/23 0526  NA 137  K 4.3  CL 108  CO2 23  BUN 13  CREATININE 1.01*  GLUCOSE 148*  CALCIUM  8.7*   No results for input(s): "LABPT", "INR" in the last 72 hours.  Sensation intact distally Dorsiflexion/Plantar flexion intact Incision: dressing C/D/I Compartment soft   Assessment/Plan: 1 Day Post-Op Procedure(s) (LRB): LEFT TOTAL KNEE ARTHROPLASTY (Left) Up with therapy Dressing changed.       Trevonte Ashkar 08/12/2023, 7:38 AM

## 2023-08-13 ENCOUNTER — Telehealth: Payer: Self-pay | Admitting: Orthopaedic Surgery

## 2023-08-13 DIAGNOSIS — M1712 Unilateral primary osteoarthritis, left knee: Secondary | ICD-10-CM | POA: Diagnosis not present

## 2023-08-13 LAB — CBC
HCT: 33.5 % — ABNORMAL LOW (ref 36.0–46.0)
Hemoglobin: 11 g/dL — ABNORMAL LOW (ref 12.0–15.0)
MCH: 33.2 pg (ref 26.0–34.0)
MCHC: 32.8 g/dL (ref 30.0–36.0)
MCV: 101.2 fL — ABNORMAL HIGH (ref 80.0–100.0)
Platelets: 190 K/uL (ref 150–400)
RBC: 3.31 MIL/uL — ABNORMAL LOW (ref 3.87–5.11)
RDW: 12.8 % (ref 11.5–15.5)
WBC: 12.2 K/uL — ABNORMAL HIGH (ref 4.0–10.5)
nRBC: 0 % (ref 0.0–0.2)

## 2023-08-13 MED ORDER — OXYCODONE HCL 5 MG PO TABS: 5.0000 mg | ORAL_TABLET | ORAL | 0 refills | Status: DC | PRN

## 2023-08-13 NOTE — Care Management Obs Status (Signed)
 MEDICARE OBSERVATION STATUS NOTIFICATION   Patient Details  Name: Diane Barker MRN: [id number] Date of Birth: 1951-03-11   Medicare Observation Status Notification Given:  Yes    Elspeth Hals, LCSW 08/13/2023, 10:21 AM

## 2023-08-13 NOTE — Progress Notes (Signed)
 Subjective: 2 Days Post-Op Procedure(s) (LRB): LEFT TOTAL KNEE ARTHROPLASTY (Left) Patient reports pain as moderate.    Objective: Vital signs in last 24 hours: Temp:  [98.4 F (36.9 C)-100.1 F (37.8 C)] 98.4 F (36.9 C) (05/29 0752) Pulse Rate:  [80-93] 84 (05/29 0752) Resp:  [16-18] 16 (05/29 0752) BP: (124-134)/(62-65) 130/62 (05/29 0752) SpO2:  [94 %-96 %] 94 % (05/29 0752)  Intake/Output from previous day: 05/28 0701 - 05/29 0700 In: 120 [P.O.:120] Out: -  Intake/Output this shift: No intake/output data recorded.  Recent Labs    08/12/23 0526 08/13/23 0629  HGB 11.9* 11.0*   Recent Labs    08/12/23 0526 08/13/23 0629  WBC 10.3 12.2*  RBC 3.61* 3.31*  HCT 36.6 33.5*  PLT 220 190   Recent Labs    08/12/23 0526  NA 137  K 4.3  CL 108  CO2 23  BUN 13  CREATININE 1.01*  GLUCOSE 148*  CALCIUM  8.7*   No results for input(s): "LABPT", "INR" in the last 72 hours.  Sensation intact distally Intact pulses distally Dorsiflexion/Plantar flexion intact Incision: dressing C/D/I Compartment soft   Assessment/Plan: 2 Days Post-Op Procedure(s) (LRB): LEFT TOTAL KNEE ARTHROPLASTY (Left) Up with therapy Discharge to SNF as soon as bed available, even today. Prescriptions on the chart      Arnie Lao 08/13/2023, 9:46 AM

## 2023-08-13 NOTE — TOC Progression Note (Addendum)
 Transition of Care [facility]) - Progression Note    Patient Details  Name: Diane Barker MRN: [id number] Date of Birth: 07/19/1950  Transition of Care [facility]) CM/SW Contact  Elspeth Hals, LCSW Phone Number: 08/13/2023, 9:49 AM  Clinical Narrative:   Pt with [employer] Medicare.  Does not require 3 midnight stay for SNF at [place].   1030: CSW confirmed [facility] that they can receive pt today with approved auth.  Auth request submitted in [place].   PASSR received: [id number]  Expected Discharge Plan: Skilled Nursing Facility Barriers to Discharge: Continued Medical Work up, SNF Pending bed offer  Expected Discharge Plan and Services In-house Referral: Clinical Social Work   Post Acute Care Choice: Skilled Nursing Facility Living arrangements for the past 2 months: Single Family Home                                       Social Determinants of Health (SDOH) Interventions SDOH Screenings   Food Insecurity: No Food Insecurity (08/12/2023)  Housing: Low Risk  (08/12/2023)  Transportation Needs: No Transportation Needs (08/12/2023)  Utilities: Not At Risk (08/12/2023)  Depression (PHQ2-9): Low Risk  (05/03/2018)  Social Connections: Moderately Integrated (08/12/2023)  Tobacco Use: Low Risk  (08/11/2023)    Readmission Risk Interventions     No data to display

## 2023-08-13 NOTE — Telephone Encounter (Signed)
 Trai from [facility] called requesting expediated order change from inpatient to observation. She state pt's authoraztion is for observation and not inpatinet

## 2023-08-13 NOTE — Telephone Encounter (Signed)
 Traci from [facility] called requesting expediated order change from inpatient to observation. She state pt's authoraztion is for observation and not inpatient. Traci states this is urgent order. Please call Traci at [phone]

## 2023-08-13 NOTE — Progress Notes (Signed)
 Physical Therapy Treatment Patient Details Name: Diane Barker MRN: [id number] DOB: April 16, 1950 Today's Date: 08/13/2023   History of Present Illness Pt is 73 yo female s/p L TKA on 08/11/23.  Pt with hx including but not limited to arthritis, unstable angina, HLD, DVT, PE, low back pain, breast CA s/p lumpectomy and chemo, anxiety, HTN    PT Comments  Pt endorsing moderate L knee pain, states LLE is mostly stiff and difficult to maneuver in bed. Pt requiring light assist for in and out of bed given LLE pain and post-op weakness, once OOB pt ambulatory for ~40 ft with use of RW. Pt with increasingly antalgic gait with further distance. Pt tolerating LLE TKA exercises well, ROM limited by pain and stiffness. Plan remains appropriate.      If plan is discharge home, recommend the following: A little help with walking and/or transfers;A little help with bathing/dressing/bathroom;Assistance with cooking/housework;Help with stairs or ramp for entrance   Can travel by private vehicle        Equipment Recommendations  Rolling walker (2 wheels)    Recommendations for Other Services       Precautions / Restrictions Precautions Precautions: Fall;Knee Required Braces or Orthoses: Knee Immobilizer - Left Knee Immobilizer - Left: Discontinue once straight leg raise with < 10 degree lag Restrictions Weight Bearing Restrictions Per Provider Order: Yes LLE Weight Bearing Per Provider Order: Weight bearing as tolerated     Mobility  Bed Mobility Overal bed mobility: Needs Assistance Bed Mobility: Supine to Sit, Sit to Supine     Supine to sit: Min assist Sit to supine: Min assist   General bed mobility comments: assist for progression of LLE into and out of bed, cues for hooking RLE under LLE to facilitate. increased time and effort    Transfers Overall transfer level: Needs assistance Equipment used: Rolling walker (2 wheels) Transfers: Sit to/from Stand Sit to Stand: Contact guard  assist           General transfer comment: close guard for safety, cues for hand placement when rising/sitting.    Ambulation/Gait Ambulation/Gait assistance: Contact guard assist Gait Distance (Feet): 40 Feet Assistive device: Rolling walker (2 wheels) Gait Pattern/deviations: Step-through pattern, Decreased stride length, Antalgic (emerging) Gait velocity: decreased     General Gait Details: cues for upright posture, placement in RW, sequencing with LLE leading to offweight with RW as needed   Stairs             Wheelchair Mobility     Tilt Bed    Modified Rankin (Stroke Patients Only)       Balance Overall balance assessment: Needs assistance Sitting-balance support: No upper extremity supported Sitting balance-Leahy Scale: Good     Standing balance support: Bilateral upper extremity supported, Reliant on assistive device for balance Standing balance-Leahy Scale: Poor                              Communication Communication Communication: No apparent difficulties  Cognition Arousal: Alert (somewhat drowsy, possibly 2/2 pain meds (also c/o feeling "hot")) Behavior During Therapy: WFL for tasks assessed/performed   PT - Cognitive impairments: No apparent impairments                         Following commands: Intact      Cueing Cueing Techniques: Verbal cues, Visual cues  Exercises Total Joint Exercises Ankle Circles/Pumps: AROM, Both, 10 reps,  Supine Quad Sets: AROM, Left, 10 reps, Supine Heel Slides: AAROM, Left, 15 reps, Supine Goniometric ROM: knee aarom ~5-40 deg limited by pain and stiffness    General Comments General comments (skin integrity, edema, etc.): shoes donned during gait      Pertinent Vitals/Pain Pain Assessment Pain Assessment: Faces Faces Pain Scale: Hurts even more Pain Location: L knee Pain Descriptors / Indicators: Discomfort, Grimacing, Guarding Pain Intervention(s): Limited activity within  patient's tolerance, Monitored during session, Repositioned    Home Living                          Prior Function            PT Goals (current goals can now be found in the care plan section) Acute Rehab PT Goals Patient Stated Goal: pt reports go to rehab since she lives alone PT Goal Formulation: With patient Time For Goal Achievement: 08/25/23 Potential to Achieve Goals: Good Progress towards PT goals: Progressing toward goals    Frequency    7X/week      PT Plan      Co-evaluation              AM-PAC PT "6 Clicks" Mobility   Outcome Measure  Help needed turning from your back to your side while in a flat bed without using bedrails?: A Little Help needed moving from lying on your back to sitting on the side of a flat bed without using bedrails?: A Little Help needed moving to and from a bed to a chair (including a wheelchair)?: A Little Help needed standing up from a chair using your arms (e.g., wheelchair or bedside chair)?: A Little Help needed to walk in hospital room?: A Little Help needed climbing 3-5 steps with a railing? : A Little 6 Click Score: 18    End of Session Equipment Utilized During Treatment: Gait belt Activity Tolerance: Patient limited by pain Patient left: in bed;with call bell/phone within reach;with bed alarm set Nurse Communication: Mobility status (c/o feeling "hot", ?drowsy possibly 2/2 pain meds) PT Visit Diagnosis: Other abnormalities of gait and mobility (R26.89);Muscle weakness (generalized) (M62.81);Pain Pain - Right/Left: Left Pain - part of body: Knee     Time: 1610-9604 PT Time Calculation (min) (ACUTE ONLY): 27 min  Charges:    $Gait Training: 8-22 mins $Therapeutic Exercise: 8-22 mins PT General Charges $$ ACUTE PT VISIT: 1 Visit                     Shirlene Doughty, PT DPT Acute Rehabilitation Services Secure Chat Preferred  Office [phone]    Talin Feister E Burnadette Carrion 08/13/2023, 3:07 PM

## 2023-08-13 NOTE — Plan of Care (Signed)
  Problem: Education: Goal: Knowledge of General Education information will improve Description: Including pain rating scale, medication(s)/side effects and non-pharmacologic comfort measures Outcome: Progressing   Problem: Health Behavior/Discharge Planning: Goal: Ability to manage health-related needs will improve Outcome: Progressing   Problem: Activity: Goal: Risk for activity intolerance will decrease Outcome: Progressing   Problem: Elimination: Goal: Will not experience complications related to bowel motility Outcome: Progressing   Problem: Elimination: Goal: Will not experience complications related to urinary retention Outcome: Progressing   Problem: Pain Managment: Goal: General experience of comfort will improve and/or be controlled Outcome: Progressing

## 2023-08-13 NOTE — Discharge Summary (Signed)
 Patient ID: Diane Barker MRN: [id number] DOB/AGE: [birth year] 73 y.o.  Admit date: 08/11/2023 Discharge date: 08/13/2023  Admission Diagnoses:  Principal Problem:   Unilateral primary osteoarthritis, left knee Active Problems:   Status post total left knee replacement   Discharge Diagnoses:  Same  Past Medical History:  Diagnosis Date   Anxiety    Arthritis    "all over my body" (01/15/2018)   Breast cancer, right breast (HCC) 2000   Rt lumpectomy, Chemo/XRT/rt axillary node    Bronchial asthma    Chronic lower back pain    Depression    Diverticulosis    DVT (deep venous thrombosis) (HCC) 06/2013   RLE   GERD (gastroesophageal reflux disease)    High cholesterol    Hx of adenomatous colonic polyps    Hypertension    Obesity    Osteoarthritis    Personal history of chemotherapy    Personal history of radiation therapy    Pulmonary embolism (HCC) 06/2013   "both lungs"   Spondylolisthesis     Surgeries: Procedure(s): LEFT TOTAL KNEE ARTHROPLASTY on 08/11/2023   Consultants:   Discharged Condition: Improved  Hospital Course: Diane Barker is an 73 y.o. female who was admitted 08/11/2023 for operative treatment ofUnilateral primary osteoarthritis, left knee. Patient has severe unremitting pain that affects sleep, daily activities, and work/hobbies. After pre-op clearance the patient was taken to the operating room on 08/11/2023 and underwent  Procedure(s): LEFT TOTAL KNEE ARTHROPLASTY.    Patient was given perioperative antibiotics:  Anti-infectives (From admission, onward)    Start     Dose/Rate Route Frequency Ordered Stop   08/11/23 0835  levofloxacin (LEVAQUIN) IVPB 500 mg        500 mg 100 mL/hr over 60 Minutes Intravenous On call to O.R. 08/11/23 0758 08/11/23 1059   08/11/23 0800  vancomycin (VANCOREADY) IVPB 1500 mg/300 mL        1,500 mg 150 mL/hr over 120 Minutes Intravenous On call to O.R. 08/11/23 4782 08/12/23 0526        Patient was given  sequential compression devices, early ambulation, and chemoprophylaxis to prevent DVT.  Patient benefited maximally from hospital stay and there were no complications.    Recent vital signs: Patient Vitals for the past 24 hrs:  BP Temp Temp src Pulse Resp SpO2  08/13/23 0752 130/62 98.4 F (36.9 C) -- 84 16 94 %  08/13/23 0544 134/62 98.4 F (36.9 C) Oral 80 18 96 %  08/12/23 2100 -- 99.1 F (37.3 C) Oral -- -- --  08/12/23 2039 124/65 100.1 F (37.8 C) -- 93 18 94 %     Recent laboratory studies:  Recent Labs    08/12/23 0526 08/13/23 0629  WBC 10.3 12.2*  HGB 11.9* 11.0*  HCT 36.6 33.5*  PLT 220 190  NA 137  --   K 4.3  --   CL 108  --   CO2 23  --   BUN 13  --   CREATININE 1.01*  --   GLUCOSE 148*  --   CALCIUM  8.7*  --      Discharge Medications:   Allergies as of 08/13/2023       Reactions   E.e.s. [erythromycin] Other (See Comments)   (Higher doses) stomach cramps   Lipitor [atorvastatin ]    High doses cause fatigued myalgias   Morphine And Codeine  Other (See Comments)   hallucinations   Neurontin [gabapentin] Other (See Comments)   Causes blackouts   Omnicef  [  cefdinir ] Itching   Penicillins Other (See Comments)   Unknown; childhood reaction.   Relafen [nabumetone] Nausea Only        Medication List     STOP taking these medications    traMADol  50 MG tablet Commonly known as: ULTRAM        TAKE these medications    acetaminophen  650 MG CR tablet Commonly known as: TYLENOL  Take 1,300 mg by mouth in the morning and at bedtime.   AeroChamber MV inhaler Use as instructed   albuterol  108 (90 Base) MCG/ACT inhaler Commonly known as: VENTOLIN  HFA INHALE 2 PUFFS INTO THE LUNGS EVERY 4 HOURS AS NEEDED FOR WHEEZING OR SHORTNESS OF BREATH.   amLODipine  2.5 MG tablet Commonly known as: NORVASC  Take 1 tablet (2.5 mg total) by mouth daily.   atorvastatin  40 MG tablet Commonly known as: LIPITOR Take 1 tablet (40 mg total) by mouth daily.    chlorhexidine 4 % external liquid Commonly known as: HIBICLENS Apply 15 mLs (1 Application total) topically as directed for 30 doses. Use as directed daily for 5 days every other week for 6 weeks.   D3 50 MCG (2000 UT) Tabs Generic drug: Cholecalciferol  Take 2,000 Units by mouth in the morning.   dexlansoprazole  60 MG capsule Commonly known as: DEXILANT  Take 60 mg by mouth in the morning.   escitalopram  10 MG tablet Commonly known as: LEXAPRO  Take 10 mg by mouth in the morning.   isosorbide  mononitrate 60 MG 24 hr tablet Commonly known as: IMDUR  Take 1.5 tablets (90 mg total) by mouth daily. What changed:  how much to take when to take this   methocarbamol  500 MG tablet Commonly known as: ROBAXIN  Take 1 tablet (500 mg total) by mouth every 6 (six) hours as needed.   mupirocin ointment 2 % Commonly known as: BACTROBAN Place 1 Application into the nose 2 (two) times daily for 60 doses. Use as directed 2 times daily for 5 days every other week for 6 weeks.   Myrbetriq 50 MG Tb24 tablet Generic drug: mirabegron ER Take 50 mg by mouth daily.   nitroGLYCERIN  0.4 MG SL tablet Commonly known as: NITROSTAT  Place 1 tablet (0.4 mg total) under the tongue every 5 (five) minutes as needed.   oxyCODONE  5 MG immediate release tablet Commonly known as: Oxy IR/ROXICODONE  Take 1-2 tablets (5-10 mg total) by mouth every 4 (four) hours as needed for moderate pain (pain score 4-6) (pain score 4-6).   rivaroxaban  20 MG Tabs tablet Commonly known as: XARELTO  Take 1 tablet (20 mg total) by mouth in the morning.               Durable Medical Equipment  (From admission, onward)           Start     Ordered   08/11/23 1408  DME 3 n 1  Once        08/11/23 1408   08/11/23 1408  DME Walker rolling  Once       Question Answer Comment  Walker: With 5 Inch Wheels   Patient needs a walker to treat with the following condition Status post total left knee replacement      08/11/23  1408            Diagnostic Studies: DG Knee Left Port Result Date: 08/11/2023 CLINICAL DATA:  Status post left knee replacement. EXAM: PORTABLE LEFT KNEE - 1-2 VIEW COMPARISON:  None Available. FINDINGS: Left knee arthroplasty in expected alignment. No periprosthetic lucency  or fracture. There has been patellar resurfacing. Recent postsurgical change includes air and edema in the soft tissues and joint space. Anterior skin staples in place. IMPRESSION: Left knee arthroplasty without immediate postoperative complication. Electronically Signed   By: Chadwick Colonel M.D.   On: 08/11/2023 15:20    Disposition: Discharge disposition: 03-Skilled Nursing Facility          Follow-up Information     [facility] Follow up.   Specialty: Home Health Services Why: [facility] will contact you for the first home visit Contact information: [address] [place] [address] [phone]         Arnie Lao, MD Follow up in 2 week(s).   Specialty: Orthopedic Surgery Contact information: [address] [place] [address] [phone]                  Signed: Arnie Lao 08/13/2023, 9:57 AM

## 2023-08-13 NOTE — Care Management CC44 (Signed)
 Condition Code 44 Documentation Completed  Patient Details  Name: Diane Barker MRN: [id number] Date of Birth: 09/27/1950   Condition Code 44 given:  Yes Patient signature on Condition Code 44 notice:  Yes Documentation of 2 MD's agreement:  Yes Code 44 added to claim:  Yes    Elspeth Hals, LCSW 08/13/2023, 10:21 AM

## 2023-08-14 DIAGNOSIS — M1712 Unilateral primary osteoarthritis, left knee: Secondary | ICD-10-CM | POA: Diagnosis not present

## 2023-08-14 NOTE — Progress Notes (Signed)
 Physical Therapy Treatment Patient Details Name: Diane Barker MRN: [id number] DOB: [birth year] Today's Date: 08/14/2023   History of Present Illness Pt is 73 yo female s/p L TKA on 08/11/23.  Pt with hx including but not limited to arthritis, unstable angina, HLD, DVT, PE, low back pain, breast CA s/p lumpectomy and chemo, anxiety, HTN    PT Comments  Pt complaining of moderate L knee pain and stiffness, eager to work with PT to loosen L knee up. Pt tolerating gentle knee flexion ROM exercises well, pt reaching approximately 45 deg flexion before it becomes too uncomfortable for pt to continue. Pt ambulatory for short hallway distance, endorsing incisional stinging and burning when up and moving. Pt plan remains appropriate for d/c to SNF.     If plan is discharge home, recommend the following: A little help with walking and/or transfers;A little help with bathing/dressing/bathroom;Assistance with cooking/housework;Help with stairs or ramp for entrance   Can travel by private vehicle        Equipment Recommendations  Rolling walker (2 wheels)    Recommendations for Other Services       Precautions / Restrictions Precautions Precautions: Fall;Knee Required Braces or Orthoses: Knee Immobilizer - Left (not donned during session) Knee Immobilizer - Left: Discontinue once straight leg raise with < 10 degree lag Restrictions Weight Bearing Restrictions Per Provider Order: Yes LLE Weight Bearing Per Provider Order: Weight bearing as tolerated     Mobility  Bed Mobility Overal bed mobility: Needs Assistance Bed Mobility: Supine to Sit     Supine to sit: Min assist     General bed mobility comments: assist for LLE progression OOB    Transfers Overall transfer level: Needs assistance Equipment used: Rolling walker (2 wheels) Transfers: Sit to/from Stand Sit to Stand: Min assist           General transfer comment: light power up assist, cues for hand placement when standing and  sitting    Ambulation/Gait Ambulation/Gait assistance: Contact guard assist Gait Distance (Feet): 70 Feet Assistive device: Rolling walker (2 wheels) Gait Pattern/deviations: Step-through pattern, Decreased stride length, Antalgic Gait velocity: decr     General Gait Details: cues for upright posture, proximity to RW, sequencing with LLE leading   Stairs             Wheelchair Mobility     Tilt Bed    Modified Rankin (Stroke Patients Only)       Balance Overall balance assessment: Needs assistance Sitting-balance support: No upper extremity supported Sitting balance-Leahy Scale: Good     Standing balance support: Bilateral upper extremity supported, Reliant on assistive device for balance Standing balance-Leahy Scale: Poor                              Communication Communication Communication: No apparent difficulties  Cognition Arousal: Alert Behavior During Therapy: WFL for tasks assessed/performed   PT - Cognitive impairments: No apparent impairments                         Following commands: Intact      Cueing Cueing Techniques: Verbal cues, Visual cues  Exercises Total Joint Exercises Heel Slides: AAROM, Left, 15 reps, Supine Goniometric ROM: knee aarom ~5-45 deg limited by pain and stiffness    General Comments        Pertinent Vitals/Pain Pain Assessment Pain Assessment: Faces Faces Pain Scale: Hurts even more Pain  Location: L knee Pain Descriptors / Indicators: Discomfort, Grimacing, Guarding Pain Intervention(s): Monitored during session, Repositioned, Limited activity within patient's tolerance    Home Living                          Prior Function            PT Goals (current goals can now be found in the care plan section) Acute Rehab PT Goals Patient Stated Goal: pt reports go to rehab since she lives alone PT Goal Formulation: With patient Time For Goal Achievement: 08/25/23 Potential to  Achieve Goals: Good Progress towards PT goals: Progressing toward goals    Frequency    7X/week      PT Plan      Co-evaluation              AM-PAC PT "6 Clicks" Mobility   Outcome Measure  Help needed turning from your back to your side while in a flat bed without using bedrails?: A Little Help needed moving from lying on your back to sitting on the side of a flat bed without using bedrails?: A Little Help needed moving to and from a bed to a chair (including a wheelchair)?: A Little Help needed standing up from a chair using your arms (e.g., wheelchair or bedside chair)?: A Little Help needed to walk in hospital room?: A Little Help needed climbing 3-5 steps with a railing? : A Lot 6 Click Score: 17    End of Session   Activity Tolerance: Patient limited by pain Patient left: with call bell/phone within reach;in chair;with chair alarm set Nurse Communication: Mobility status PT Visit Diagnosis: Other abnormalities of gait and mobility (R26.89);Muscle weakness (generalized) (M62.81);Pain Pain - Right/Left: Left Pain - part of body: Knee     Time: 1610-9604 PT Time Calculation (min) (ACUTE ONLY): 25 min  Charges:    $Gait Training: 8-22 mins $Therapeutic Activity: 8-22 mins PT General Charges $$ ACUTE PT VISIT: 1 Visit                     Shirlene Doughty, PT DPT Acute Rehabilitation Services Secure Chat Preferred  Office [phone]    Jamarquis Crull Cydney Draft 08/14/2023, 2:37 PM

## 2023-08-14 NOTE — Progress Notes (Signed)
 Patient ID: Diane Barker, female   DOB: 12-Aug-1950, 73 y.o.   MRN: [id number] There has been no acute changes in the patient's medical status.  She is stable.  She can be discharged to skilled nursing today.  Her vital signs are stable and her left operative knee is stable.

## 2023-08-14 NOTE — TOC Progression Note (Addendum)
 Transition of Care [facility]) - Progression Note    Patient Details  Name: Diane Barker MRN: [id number] Date of Birth: 1950/12/03  Transition [facility]) CM/SW Contact  Elspeth Hals, LCSW Phone Number: 08/14/2023, 8:29 AM  Clinical Narrative:   SNF auth request remains pending in [place].   1200: SNF auth still pending.   1255: [employer] offering P2P for SNF auth by 5pm today.  Call [phone], #5.  Will need insurance ID: [id number]    MD notified by chat.  TC MD office, MD not in today.  CSW left several messages as directed by office staff.    1400: TC pt daughter Alston Jerry.  She received call from someone at [facility] stating they needed additional information to process SNF auth request.  [phone].  CSW called and this is [employer] number.  CSW contacted [employer]: they confirmed they are waiting on P2P but have all other needed info.   Message from Dr Lucienne Ryder: P2P completed, auth approved.    CSW confirmed with [facility] that they can still receive pt today.  1500: Auth now showing in [place]: [id number], 5 days: 5/29-6/2.   Expected Discharge Plan: Skilled Nursing Facility Barriers to Discharge: Continued Medical Work up, SNF Pending bed offer  Expected Discharge Plan and Services In-house Referral: Clinical Social Work   Post Acute Care Choice: Skilled Nursing Facility Living arrangements for the past 2 months: Single Family Home Expected Discharge Date: 08/14/23                                     Social Determinants of Health (SDOH) Interventions SDOH Screenings   Food Insecurity: No Food Insecurity (08/12/2023)  Housing: Low Risk  (08/12/2023)  Transportation Needs: No Transportation Needs (08/12/2023)  Utilities: Not At Risk (08/12/2023)  Depression (PHQ2-9): Low Risk  (05/03/2018)  Social Connections: Moderately Integrated (08/12/2023)  Tobacco Use: Low Risk  (08/11/2023)    Readmission Risk Interventions     No data to display

## 2023-08-14 NOTE — TOC Transition Note (Signed)
 Transition of Care [facility]) - Discharge Note   Patient Details  Name: Diane Barker MRN: [id number] Date of Birth: August 31, 1950  Transition of Care [facility]) CM/SW Contact:  Elspeth Hals, LCSW Phone Number: 08/14/2023, 3:02 PM   Clinical Narrative:  Pt discharging to [facility].  RN call report to [phone].  PTAR called 1500.       Final next level of care: Skilled Nursing Facility Barriers to Discharge: Barriers Resolved   Patient Goals and CMS Choice Patient states their goals for this hospitalization and ongoing recovery are:: do the things I normally do   Choice offered to / list presented to : Patient (pt requesting [facility])      Discharge Placement              Patient chooses bed at: [facility] Patient to be transferred to facility by: ptar Name of family member notified: daughter Alston Jerry Patient and family notified of of transfer: 08/14/23  Discharge Plan and Services Additional resources added to the After Visit Summary for   In-house Referral: Clinical Social Work   Post Acute Care Choice: Skilled Nursing Facility                               Social Drivers of Health (SDOH) Interventions SDOH Screenings   Food Insecurity: No Food Insecurity (08/12/2023)  Housing: Low Risk  (08/12/2023)  Transportation Needs: No Transportation Needs (08/12/2023)  Utilities: Not At Risk (08/12/2023)  Depression (PHQ2-9): Low Risk  (05/03/2018)  Social Connections: Moderately Integrated (08/12/2023)  Tobacco Use: Low Risk  (08/11/2023)     Readmission Risk Interventions     No data to display

## 2023-08-14 NOTE — Progress Notes (Signed)
 Report called to Nurse Lane Pinon at [facility]. Patient picked up by [facility]

## 2023-08-14 NOTE — Discharge Summary (Signed)
 Patient ID: Diane Barker MRN: [id number] DOB/AGE: October 20, 1950 73 y.o.  Admit date: 08/11/2023 Discharge date: 08/14/2023  Admission Diagnoses:  Principal Problem:   Unilateral primary osteoarthritis, left knee Active Problems:   Status post total left knee replacement   Discharge Diagnoses:  Same  Past Medical History:  Diagnosis Date   Anxiety    Arthritis    "all over my body" (01/15/2018)   Breast cancer, right breast (HCC) 2000   Rt lumpectomy, Chemo/XRT/rt axillary node    Bronchial asthma    Chronic lower back pain    Depression    Diverticulosis    DVT (deep venous thrombosis) (HCC) 06/2013   RLE   GERD (gastroesophageal reflux disease)    High cholesterol    Hx of adenomatous colonic polyps    Hypertension    Obesity    Osteoarthritis    Personal history of chemotherapy    Personal history of radiation therapy    Pulmonary embolism (HCC) 06/2013   "both lungs"   Spondylolisthesis     Surgeries: Procedure(s): LEFT TOTAL KNEE ARTHROPLASTY on 08/11/2023   Consultants:   Discharged Condition: Improved  Hospital Course: Diane Barker is an 73 y.o. female who was admitted 08/11/2023 for operative treatment ofUnilateral primary osteoarthritis, left knee. Patient has severe unremitting pain that affects sleep, daily activities, and work/hobbies. After pre-op clearance the patient was taken to the operating room on 08/11/2023 and underwent  Procedure(s): LEFT TOTAL KNEE ARTHROPLASTY.    Patient was given perioperative antibiotics:  Anti-infectives (From admission, onward)    Start     Dose/Rate Route Frequency Ordered Stop   08/11/23 0835  levofloxacin (LEVAQUIN) IVPB 500 mg        500 mg 100 mL/hr over 60 Minutes Intravenous On call to O.R. 08/11/23 0758 08/11/23 1059   08/11/23 0800  vancomycin (VANCOREADY) IVPB 1500 mg/300 mL        1,500 mg 150 mL/hr over 120 Minutes Intravenous On call to O.R. 08/11/23 4098 08/12/23 0526        Patient was given  sequential compression devices, early ambulation, and chemoprophylaxis to prevent DVT.  Patient benefited maximally from hospital stay and there were no complications.    Recent vital signs: Patient Vitals for the past 24 hrs:  BP Temp Temp src Pulse Resp SpO2  08/14/23 0504 (!) 113/48 98.2 F (36.8 C) -- 79 18 98 %  08/13/23 1957 (!) 134/59 97.9 F (36.6 C) Oral 85 18 100 %  08/13/23 1504 116/65 98.6 F (37 C) -- 96 16 95 %  08/13/23 0752 130/62 98.4 F (36.9 C) -- 84 16 94 %     Recent laboratory studies:  Recent Labs    08/12/23 0526 08/13/23 0629  WBC 10.3 12.2*  HGB 11.9* 11.0*  HCT 36.6 33.5*  PLT 220 190  NA 137  --   K 4.3  --   CL 108  --   CO2 23  --   BUN 13  --   CREATININE 1.01*  --   GLUCOSE 148*  --   CALCIUM  8.7*  --      Discharge Medications:   Allergies as of 08/14/2023       Reactions   E.e.s. [erythromycin] Other (See Comments)   (Higher doses) stomach cramps   Lipitor [atorvastatin ]    High doses cause fatigued myalgias   Morphine And Codeine  Other (See Comments)   hallucinations   Neurontin [gabapentin] Other (See Comments)   Causes blackouts  Omnicef  [cefdinir ] Itching   Penicillins Other (See Comments)   Unknown; childhood reaction.   Relafen [nabumetone] Nausea Only        Medication List     STOP taking these medications    traMADol  50 MG tablet Commonly known as: ULTRAM        TAKE these medications    acetaminophen  650 MG CR tablet Commonly known as: TYLENOL  Take 1,300 mg by mouth in the morning and at bedtime.   AeroChamber MV inhaler Use as instructed   albuterol  108 (90 Base) MCG/ACT inhaler Commonly known as: VENTOLIN  HFA INHALE 2 PUFFS INTO THE LUNGS EVERY 4 HOURS AS NEEDED FOR WHEEZING OR SHORTNESS OF BREATH.   amLODipine  2.5 MG tablet Commonly known as: NORVASC  Take 1 tablet (2.5 mg total) by mouth daily.   atorvastatin  40 MG tablet Commonly known as: LIPITOR Take 1 tablet (40 mg total) by mouth  daily.   chlorhexidine 4 % external liquid Commonly known as: HIBICLENS Apply 15 mLs (1 Application total) topically as directed for 30 doses. Use as directed daily for 5 days every other week for 6 weeks.   D3 50 MCG (2000 UT) Tabs Generic drug: Cholecalciferol  Take 2,000 Units by mouth in the morning.   dexlansoprazole  60 MG capsule Commonly known as: DEXILANT  Take 60 mg by mouth in the morning.   escitalopram  10 MG tablet Commonly known as: LEXAPRO  Take 10 mg by mouth in the morning.   isosorbide  mononitrate 60 MG 24 hr tablet Commonly known as: IMDUR  Take 1.5 tablets (90 mg total) by mouth daily. What changed:  how much to take when to take this   methocarbamol  500 MG tablet Commonly known as: ROBAXIN  Take 1 tablet (500 mg total) by mouth every 6 (six) hours as needed.   mupirocin ointment 2 % Commonly known as: BACTROBAN Place 1 Application into the nose 2 (two) times daily for 60 doses. Use as directed 2 times daily for 5 days every other week for 6 weeks.   Myrbetriq 50 MG Tb24 tablet Generic drug: mirabegron ER Take 50 mg by mouth daily.   nitroGLYCERIN  0.4 MG SL tablet Commonly known as: NITROSTAT  Place 1 tablet (0.4 mg total) under the tongue every 5 (five) minutes as needed.   oxyCODONE  5 MG immediate release tablet Commonly known as: Oxy IR/ROXICODONE  Take 1-2 tablets (5-10 mg total) by mouth every 4 (four) hours as needed for moderate pain (pain score 4-6) (pain score 4-6).   rivaroxaban  20 MG Tabs tablet Commonly known as: XARELTO  Take 1 tablet (20 mg total) by mouth in the morning.               Durable Medical Equipment  (From admission, onward)           Start     Ordered   08/11/23 1408  DME 3 n 1  Once        08/11/23 1408   08/11/23 1408  DME Walker rolling  Once       Question Answer Comment  Walker: With 5 Inch Wheels   Patient needs a walker to treat with the following condition Status post total left knee replacement       08/11/23 1408            Diagnostic Studies: DG Knee Left Port Result Date: 08/11/2023 CLINICAL DATA:  Status post left knee replacement. EXAM: PORTABLE LEFT KNEE - 1-2 VIEW COMPARISON:  None Available. FINDINGS: Left knee arthroplasty in expected alignment. No periprosthetic  lucency or fracture. There has been patellar resurfacing. Recent postsurgical change includes air and edema in the soft tissues and joint space. Anterior skin staples in place. IMPRESSION: Left knee arthroplasty without immediate postoperative complication. Electronically Signed   By: Chadwick Colonel M.D.   On: 08/11/2023 15:20    Disposition: Discharge disposition: 03-Skilled Nursing Facility          Follow-up Information     [facility] Follow up.   Specialty: Home Health Services Why: [facility] will contact you for the first home visit Contact information: [address] [place] [address] [phone]         Arnie Lao, MD Follow up in 2 week(s).   Specialty: Orthopedic Surgery Contact information: [address] [place]  [place] [address] [phone]                  Signed: Arnie Lao 08/14/2023, 6:33 AM

## 2023-08-14 NOTE — Progress Notes (Signed)
 Attempted to call report to [phone]. [facility] staff unable to take report at this time. She asked for my number to call me back.

## 2023-08-24 ENCOUNTER — Encounter: Payer: Self-pay | Admitting: Physician Assistant

## 2023-08-24 ENCOUNTER — Ambulatory Visit (INDEPENDENT_AMBULATORY_CARE_PROVIDER_SITE_OTHER): Admitting: Physician Assistant

## 2023-08-24 DIAGNOSIS — Z96652 Presence of left artificial knee joint: Secondary | ICD-10-CM

## 2023-08-24 NOTE — Progress Notes (Signed)
 HPI: Diane Barker returns today status post left total knee arthroplasty 08/11/2023.  She states that she is overall doing well.  She states that the pain in her knee was present for approximately 4 days or so.  She has some discomfort in the knee mostly medial side.  She takes oxycodone  and muscle relaxant which she states is very helpful.  She is currently out of facility.  She is on chronic Xarelto  for history of DVT.  Review of systems see HPI otherwise negative  Physical exam: General Well-developed well-nourished female in no acute distress able to get up and down out of the wheelchair on her own. Left knee full extension flexion 90 degrees.  No instability.  Staples well-approximated skin no signs of infection or wound dehiscence.  Staples are harvested today Steri-Strips applied.  Left calf supple nontender dorsiflexion plantarflexion left ankle intact.  Impression: Status post left total knee arthroplasty  Plan: She will continue work on range of motion strengthening.  Will set her up for outpatient physical therapy here at our office to work on range of motion strengthening.  Questions were encouraged and answered at length.  She will follow-up with us  in 1 month sooner if there is any questions or concerns .

## 2023-08-26 ENCOUNTER — Telehealth: Payer: Self-pay | Admitting: Physician Assistant

## 2023-08-26 NOTE — Telephone Encounter (Signed)
 LMOM for patient if she is talking about the knee immobilizer, then she needs to get rid of that

## 2023-08-26 NOTE — Telephone Encounter (Signed)
 Patient called, would like to know how often she needs to wear the brace given to her? Her cb# [phone]

## 2023-08-27 ENCOUNTER — Other Ambulatory Visit: Payer: Self-pay

## 2023-08-27 DIAGNOSIS — Z96652 Presence of left artificial knee joint: Secondary | ICD-10-CM

## 2023-08-28 ENCOUNTER — Telehealth: Payer: Self-pay | Admitting: Physician Assistant

## 2023-08-28 NOTE — Telephone Encounter (Signed)
 Pt called to check the status of the referral for PT. Pt request call back

## 2023-09-02 ENCOUNTER — Ambulatory Visit: Attending: Orthopaedic Surgery

## 2023-09-02 ENCOUNTER — Other Ambulatory Visit: Payer: Self-pay

## 2023-09-02 DIAGNOSIS — Z96652 Presence of left artificial knee joint: Secondary | ICD-10-CM | POA: Insufficient documentation

## 2023-09-02 DIAGNOSIS — M6281 Muscle weakness (generalized): Secondary | ICD-10-CM | POA: Diagnosis present

## 2023-09-02 DIAGNOSIS — M25562 Pain in left knee: Secondary | ICD-10-CM | POA: Diagnosis present

## 2023-09-02 DIAGNOSIS — M25662 Stiffness of left knee, not elsewhere classified: Secondary | ICD-10-CM | POA: Diagnosis present

## 2023-09-02 DIAGNOSIS — R600 Localized edema: Secondary | ICD-10-CM | POA: Diagnosis present

## 2023-09-02 DIAGNOSIS — R262 Difficulty in walking, not elsewhere classified: Secondary | ICD-10-CM | POA: Insufficient documentation

## 2023-09-02 NOTE — Therapy (Signed)
 OUTPATIENT PHYSICAL THERAPY LOWER EXTREMITY EVALUATION   Patient Name: Diane Barker MRN: [id number] DOB:03-Apr-1950, 73 y.o., female Today's Date: 09/02/2023  END OF SESSION:  PT End of Session - 09/02/23 1923     Visit Number 1    Number of Visits 17    Date for PT Re-Evaluation 11/06/23    Authorization Type [employer] DUAL COMPLETE    PT Start Time 1336    PT Stop Time 1419    PT Time Calculation (min) 43 min    Activity Tolerance Patient tolerated treatment well    Behavior During Therapy WFL for tasks assessed/performed          Past Medical History:  Diagnosis Date   Anxiety    Arthritis    all over my body (01/15/2018)   Breast cancer, right breast (HCC) 2000   Rt lumpectomy, Chemo/XRT/rt axillary node    Bronchial asthma    Chronic lower back pain    Depression    Diverticulosis    DVT (deep venous thrombosis) (HCC) 06/2013   RLE   GERD (gastroesophageal reflux disease)    High cholesterol    Hx of adenomatous colonic polyps    Hypertension    Obesity    Osteoarthritis    Personal history of chemotherapy    Personal history of radiation therapy    Pulmonary embolism (HCC) 06/2013   both lungs   Spondylolisthesis    Past Surgical History:  Procedure Laterality Date   BREAST BIOPSY Right 2000   BREAST LUMPECTOMY Right 2000   for CA txed w/ chemo and radiation, right   CARDIAC CATHETERIZATION  03/21/2020   CORONARY ATHERECTOMY N/A 04/03/2022   Procedure: CORONARY ATHERECTOMY;  Surgeon: Swaziland, Peter M, MD;  Location: [facility] INVASIVE CV LAB;  Service: Cardiovascular;  Laterality: N/A;   CORONARY IMAGING/OCT N/A 04/03/2022   Procedure: INTRAVASCULAR IMAGING/OCT;  Surgeon: Swaziland, Peter M, MD;  Location: [facility] INVASIVE CV LAB;  Service: Cardiovascular;  Laterality: N/A;   CORONARY PRESSURE/FFR STUDY N/A 01/18/2018   Procedure: INTRAVASCULAR PRESSURE WIRE/FFR STUDY;  Surgeon: Odie Benne, MD;  Location: [facility] INVASIVE CV LAB;  Service:  Cardiovascular;  Laterality: N/A;   CORONARY PRESSURE/FFR STUDY N/A 04/03/2022   Procedure: INTRAVASCULAR PRESSURE WIRE/FFR STUDY;  Surgeon: Swaziland, Peter M, MD;  Location: [facility] INVASIVE CV LAB;  Service: Cardiovascular;  Laterality: N/A;   CORONARY STENT INTERVENTION N/A 04/03/2022   Procedure: CORONARY STENT INTERVENTION;  Surgeon: Swaziland, Peter M, MD;  Location: [facility] INVASIVE CV LAB;  Service: Cardiovascular;  Laterality: N/A;   HERNIA REPAIR     LEFT HEART CATH AND CORONARY ANGIOGRAPHY N/A 01/18/2018   Procedure: LEFT HEART CATH AND CORONARY ANGIOGRAPHY;  Surgeon: Odie Benne, MD;  Location: [facility] INVASIVE CV LAB;  Service: Cardiovascular;  Laterality: N/A;   LEFT HEART CATH AND CORONARY ANGIOGRAPHY N/A 03/21/2020   Procedure: LEFT HEART CATH AND CORONARY ANGIOGRAPHY;  Surgeon: Wenona Hamilton, MD;  Location: [facility] INVASIVE CV LAB;  Service: Cardiovascular;  Laterality: N/A;   LEFT HEART CATH AND CORONARY ANGIOGRAPHY N/A 04/03/2022   Procedure: LEFT HEART CATH AND CORONARY ANGIOGRAPHY;  Surgeon: Swaziland, Peter M, MD;  Location: [facility] INVASIVE CV LAB;  Service: Cardiovascular;  Laterality: N/A;   TOTAL KNEE ARTHROPLASTY Left 08/11/2023   Procedure: LEFT TOTAL KNEE ARTHROPLASTY;  Surgeon: Arnie Lao, MD;  Location: [facility] OR;  Service: Orthopedics;  Laterality: Left;   TUBAL LIGATION     UMBILICAL HERNIA REPAIR  1983   Patient Active Problem  List   Diagnosis Date Noted   Status post total left knee replacement 08/11/2023   Unilateral primary osteoarthritis, left knee 08/10/2023   Hyperlipidemia 08/01/2020   Asthmatic bronchitis, mild persistent, uncomplicated    Unstable angina (HCC) 01/16/2018   Cellulitis of right arm 10/07/2016   Sepsis (HCC) 10/07/2016   Chest pain 10/07/2016   Dyspnea on exertion 05/24/2015   DVT, lower extremity (HCC) 05/09/2014   Right calf pain 06/21/2013   Pulmonary embolism (HCC) 06/21/2013   Dysphagia 06/27/2011   Cough 06/27/2011   CHEST PAIN  01/18/2009   RHINITIS 04/13/2007   Obstructive chronic bronchitis with acute bronchitis (HCC) 04/13/2007   GERD 04/13/2007   Depression 01/21/2007   OSTEOARTHRITIS 01/21/2007   LOW BACK PAIN 01/21/2007   BREAST CANCER, HX OF 01/21/2007    PCP: Margarete Sharps, MD   REFERRING PROVIDER: Arnie Lao, MD   REFERRING DIAG: [phone] (ICD-10-CM) - Status post total left knee replacement   THERAPY DIAG:  Acute pain of left knee  Difficulty in walking, not elsewhere classified  Localized edema  Muscle weakness (generalized)  Stiffness of left knee, not elsewhere classified  Rationale for Evaluation and Treatment: Rehabilitation  ONSET DATE: 5/27/25TKA  SUBJECTIVE:   SUBJECTIVE STATEMENT: Pt reports she is doing well with her knee following surgery. Pt notes her L foot at times feels cold and the top of her L foot will hurt. Following her hospitalization, the pt reports she received therapy at [facility] for approx 1 week.  PERTINENT HISTORY: High BMI, arthritis, spondilolisthesis  PAIN:  Are you having pain? Yes: NPRS scale: 2-10/10 Pain location: Medial L knee Pain description: soreness Aggravating factors: Activity level Relieving factors: pain medication, moving, cold pack  PRECAUTIONS: Knee  RED FLAGS: None   WEIGHT BEARING RESTRICTIONS: No  FALLS:  Has patient fallen in last 6 months? No  LIVING ENVIRONMENT: Lives with: lives alone but daughter is helping Lives in: House/apartment Stairs: Yes: External: 2 steps; none Has following equipment at home: Walker - 2 wheeled, shower chair, and bed side commode  OCCUPATION: Retired  PLOF: Independent  PATIENT GOALS: Good use of my L knee  NEXT MD VISIT: 09/24/23  OBJECTIVE:  Note: Objective measures were completed at Evaluation unless otherwise noted.  DIAGNOSTIC FINDINGS:   PATIENT SURVEYS:  LEFS: 31/80=39% ability  COGNITION: Overall cognitive status: Within functional limits for  tasks assessed     SENSATION: WFL  EDEMA:  Present for the L knee  POSTURE: rounded shoulders, forward head, and flexed trunk   PALPATION: TTP of the L peri-knee especially medially  LOWER EXTREMITY ROM:  Active ROM Right eval Left eval  Hip flexion    Hip extension    Hip abduction    Hip adduction    Hip internal rotation    Hip external rotation    Knee flexion 90   Knee extension 20 lacking    Ankle dorsiflexion    Ankle plantarflexion    Ankle inversion    Ankle eversion     (Blank rows = not tested)  LOWER EXTREMITY MMT:  MMT Right eval Left eval  Hip flexion 3   Hip extension    Hip abduction 3   Hip adduction    Hip internal rotation    Hip external rotation 3   Knee flexion 3   Knee extension 3   Ankle dorsiflexion    Ankle plantarflexion    Ankle inversion    Ankle eversion     (Blank  rows = not tested)   FUNCTIONAL TESTS:  5 times sit to stand: TBA 2 minute walk test: TBA  GAIT: Distance walked: 200' Assistive device utilized: Environmental consultant - 2 wheeled Level of assistance: Modified independence Comments: Decreased pace                                                                                                                                TREATMENT DATE:  [facility] Adult PT Treatment:                                                DATE: 09/02/23 Therapeutic Exercise: Developed, instructed in, and pt completed therex as noted in HEP  Self Care: RICE for symptom management    PATIENT EDUCATION:  Education details: Eval findings, POC, HEP, self care  Person educated: Patient Education method: Explanation, Demonstration, Tactile cues, Verbal cues, and Handouts Education comprehension: verbalized understanding, returned demonstration, verbal cues required, and tactile cues required  HOME EXERCISE PROGRAM: Access Code: [email or web address] URL: [email or web address] Date: 09/02/2023 Prepared by: Liborio Reeds  Exercises - Supine Quad  Set  - 2 x daily - 7 x weekly - 1 sets - 10 reps - 5 hold - Active Straight Leg Raise with Quad Set  - 2 x daily - 7 x weekly - 2 sets - 10 reps - 3 hold - Supine Heel Slide  - 2 x daily - 7 x weekly - 1 sets - 10 reps - 5 hold - Supine Heel Slide with Strap  - 2 x daily - 7 x weekly - 1 sets - 5 reps - 20 hold - Seated Table Hamstring Stretch  - 2 x daily - 7 x weekly - 1 sets - 5 reps - 30 hold - Seated Long Arc Quad  - 2 x daily - 7 x weekly - 1 sets - 10 reps - 5 hold  ASSESSMENT:  CLINICAL IMPRESSION: Patient is a 73 y.o. female who was seen today for physical therapy evaluation and treatment for Z96.652 (ICD-10-CM) - Status post total left knee replacement .   OBJECTIVE IMPAIRMENTS: decreased activity tolerance, difficulty walking, decreased ROM, decreased strength, increased edema, obesity, and pain.   ACTIVITY LIMITATIONS: carrying, lifting, bending, sitting, standing, squatting, sleeping, stairs, transfers, bed mobility, bathing, dressing, hygiene/grooming, locomotion level, and caring for others  PARTICIPATION LIMITATIONS: meal prep, cleaning, laundry, driving, shopping, and community activity  PERSONAL FACTORS: Fitness, Past/current experiences, Time since onset of injury/illness/exacerbation, and 1 comorbidity: high BMI are also affecting patient's functional outcome.   REHAB POTENTIAL: Good  CLINICAL DECISION MAKING: Evolving/moderate complexity  EVALUATION COMPLEXITY: Moderate   GOALS:  SHORT TERM GOALS: Target date: 09/18/23 Pt will be Ind in an initial HEP  Baseline:started Goal status: INITIAL  2.  Pt will demonstrate AROM for the L knee for  10-100d to progress toward functional ROM  Baseline: 20-90 Goal status: INITIAL  LONG TERM GOALS: Target date: 11/06/23  Pt will be Ind in a final HEP to maintain achieved LOF  Baseline:  Goal status: INITIAL  2.  Pt will demonstrate AROM for the L knee for 0-115d for appropriate functional use with sitting and  negotiating steps Baseline: 20-90 Goal status: INITIAL  3.  Pt will demonstrate L knee and hip strength of 4/5 or greater for appropriate functional mobility Baseline: 3/5 Goal status: INITIAL  4.  Improve 5xSTS by MCID of 5 and by MCID of 3ft as indication of improved functional mobility  Baseline: TBA when pt is able to walk without the assistance of a SPC Goal status: INITIAL  5.  Pt will be able to walk Indly 598ft and negotiate 12 steps for community ambulation Baseline:  Goal status: INITIAL  6.  Pt's LEFS score will improve by the MCID to 59% % as indication of improved function  Baseline: 39% Goal status: INITIAL   PLAN:  PT FREQUENCY: 2x/week  PT DURATION: 8 weeks  PLANNED INTERVENTIONS: 97164- PT Re-evaluation, 97110-Therapeutic exercises, 97530- Therapeutic activity, 97112- Neuromuscular re-education, 97535- Self Care, 47829- Manual therapy, [id number]- Gait training, [phone]- Electrical stimulation (unattended), 97016- Vasopneumatic device, 20560 (1-2 muscles), 20561 (3+ muscles)- Dry Needling, Patient/Family education, Balance training, Stair training, Taping, Joint mobilization, Cryotherapy, and Moist heat  PLAN FOR NEXT SESSION: Assess 5xSTS and when pt is able to walk without SPC assistance; assess response to HEP; progress therex as indicated; use of modalities, manual therapy; and TPDN as indicated.   Korinna Tat MS, PT 09/02/23 7:38 PM

## 2023-09-03 ENCOUNTER — Other Ambulatory Visit: Payer: Self-pay | Admitting: Orthopaedic Surgery

## 2023-09-03 ENCOUNTER — Telehealth: Payer: Self-pay | Admitting: Physician Assistant

## 2023-09-03 MED ORDER — OXYCODONE HCL 5 MG PO TABS: 5.0000 mg | ORAL_TABLET | Freq: Four times a day (QID) | ORAL | 0 refills | Status: DC | PRN

## 2023-09-03 NOTE — Telephone Encounter (Signed)
 Pt called requesting refill hydrocodone . Please send to [facility]. Pt phone number [phone].

## 2023-09-09 ENCOUNTER — Ambulatory Visit

## 2023-09-09 DIAGNOSIS — R262 Difficulty in walking, not elsewhere classified: Secondary | ICD-10-CM

## 2023-09-09 DIAGNOSIS — R600 Localized edema: Secondary | ICD-10-CM

## 2023-09-09 DIAGNOSIS — M25562 Pain in left knee: Secondary | ICD-10-CM | POA: Diagnosis not present

## 2023-09-09 NOTE — Therapy (Signed)
 OUTPATIENT PHYSICAL THERAPY NOTE   Patient Name: Diane Barker MRN: [id number] DOB:[birth year], 73 y.o., female Today's Date: 09/09/2023  END OF SESSION:   PT End of Session - 09/09/23 1356     Visit Number 2    Number of Visits 17    Date for PT Re-Evaluation 11/06/23    Authorization Type [employer] DUAL COMPLETE    PT Start Time 1400    PT Stop Time 1445    PT Time Calculation (min) 45 min    Activity Tolerance Patient tolerated treatment well    Behavior During Therapy WFL for tasks assessed/performed           Past Medical History:  Diagnosis Date   Anxiety    Arthritis    all over my body (01/15/2018)   Breast cancer, right breast (HCC) 2000   Rt lumpectomy, Chemo/XRT/rt axillary node    Bronchial asthma    Chronic lower back pain    Depression    Diverticulosis    DVT (deep venous thrombosis) (HCC) 06/2013   RLE   GERD (gastroesophageal reflux disease)    High cholesterol    Hx of adenomatous colonic polyps    Hypertension    Obesity    Osteoarthritis    Personal history of chemotherapy    Personal history of radiation therapy    Pulmonary embolism (HCC) 06/2013   both lungs   Spondylolisthesis    Past Surgical History:  Procedure Laterality Date   BREAST BIOPSY Right 2000   BREAST LUMPECTOMY Right 2000   for CA txed w/ chemo and radiation, right   CARDIAC CATHETERIZATION  03/21/2020   CORONARY ATHERECTOMY N/A 04/03/2022   Procedure: CORONARY ATHERECTOMY;  Surgeon: Swaziland, Peter M, MD;  Location: [facility] INVASIVE CV [facility];  Service: Cardiovascular;  Laterality: N/A;   CORONARY IMAGING/OCT N/A 04/03/2022   Procedure: INTRAVASCULAR IMAGING/OCT;  Surgeon: Swaziland, Peter M, MD;  Location: [facility] INVASIVE CV LAB;  Service: Cardiovascular;  Laterality: N/A;   CORONARY PRESSURE/FFR STUDY N/A 01/18/2018   Procedure: INTRAVASCULAR PRESSURE WIRE/FFR STUDY;  Surgeon: Verlin Lonni BIRCH, MD;  Location: [facility] INVASIVE CV [facility];  Service: Cardiovascular;  Laterality:  N/A;   CORONARY PRESSURE/FFR STUDY N/A 04/03/2022   Procedure: INTRAVASCULAR PRESSURE WIRE/FFR STUDY;  Surgeon: Swaziland, Peter M, MD;  Location: [facility] INVASIVE CV LAB;  Service: Cardiovascular;  Laterality: N/A;   CORONARY STENT INTERVENTION N/A 04/03/2022   Procedure: CORONARY STENT INTERVENTION;  Surgeon: Swaziland, Peter M, MD;  Location: [facility] INVASIVE CV LAB;  Service: Cardiovascular;  Laterality: N/A;   HERNIA REPAIR     LEFT HEART CATH AND CORONARY ANGIOGRAPHY N/A 01/18/2018   Procedure: LEFT HEART CATH AND CORONARY ANGIOGRAPHY;  Surgeon: Verlin Lonni BIRCH, MD;  Location: [facility] INV[facility] CV [facility];  Service: Cardiovascular;  Laterality: N/A;   LEFT HEART CATH AND CORONARY ANGIOGRAPHY N/A 03/21/2020   Procedure: LEFT HEART CATH AND CORONARY ANGIOGRAPHY;  Surgeon: Darron Deatrice LABOR, MD;  Location: [facility] INVASIVE CV [facility];  Service: Cardiovascular;  Laterality: N/A;   LEFT HEART CATH AND CORONARY ANGIOGRAPHY N/A 04/03/2022   Procedure: LEFT HEART CATH AND CORONARY ANGIOGRAPHY;  Surgeon: Swaziland, Peter M, MD;  Location: [facility] INVASIVE CV LAB;  Service: Cardiovascular;  Laterality: N/A;   TOTAL KNEE ARTHROPLASTY Left 08/11/2023   Procedure: LEFT TOTAL KNEE ARTHROPLASTY;  Surgeon: Vernetta Lonni GRADE, MD;  Location: [facility] OR;  Service: Orthopedics;  Laterality: Left;   TUBAL LIGATION     UMBILICAL HERNIA REPAIR  1983   Patient Active Problem  List   Diagnosis Date Noted   Status post total left knee replacement 08/11/2023   Unilateral primary osteoarthritis, left knee 08/10/2023   Hyperlipidemia 08/01/2020   Asthmatic bronchitis, mild persistent, uncomplicated    Unstable angina (HCC) 01/16/2018   Cellulitis of right arm 10/07/2016   Sepsis (HCC) 10/07/2016   Chest pain 10/07/2016   Dyspnea on exertion 05/24/2015   DVT, lower extremity (HCC) 05/09/2014   Right calf pain 06/21/2013   Pulmonary embolism (HCC) 06/21/2013   Dysphagia 06/27/2011   Cough 06/27/2011   CHEST PAIN 01/18/2009   RHINITIS 04/13/2007    Obstructive chronic bronchitis with acute bronchitis (HCC) 04/13/2007   GERD 04/13/2007   Depression 01/21/2007   OSTEOARTHRITIS 01/21/2007   LOW BACK PAIN 01/21/2007   BREAST CANCER, HX OF 01/21/2007    PCP: Onita Rush, MD  REFERRING PROVIDER: Vernetta Lonni GRADE, MD  REFERRING DIAG: [phone] (ICD-10-CM) - Status post total left knee replacement   THERAPY DIAG:  Acute pain of left knee  Difficulty in walking, not elsewhere classified  Localized edema  Rationale for Evaluation and Treatment: Rehabilitation  ONSET DATE: 08/11/23 TKA  SUBJECTIVE:   SUBJECTIVE STATEMENT:  09/10/2023 Patient reports that she had pain her knee last night. She ambulated without RW today, stating she is no longer using it, because she feels fine walking without it.   Eval: Pt reports she is doing well with her knee following surgery. Pt notes her L foot at times feels cold and the top of her L foot will hurt. Following her hospitalization, the pt reports she received therapy at [facility] for approx 1 week.  PERTINENT HISTORY: High BMI, arthritis, spondilolisthesis  PAIN:  Are you having pain? Yes: NPRS scale: 2-10/10 Pain location: Medial L knee Pain description: soreness Aggravating factors: Activity level Relieving factors: pain medication, moving, cold pack  PRECAUTIONS: Knee  RED FLAGS: None   WEIGHT BEARING RESTRICTIONS: No  FALLS:  Has patient fallen in last 6 months? No  LIVING ENVIRONMENT: Lives with: lives alone but daughter is helping Lives in: House/apartment Stairs: Yes: External: 2 steps; none Has following equipment at home: Walker - 2 wheeled, shower chair, and bed side commode  OCCUPATION: Retired  PLOF: Independent  PATIENT GOALS: Good use of my L knee  NEXT MD VISIT: 09/24/23  OBJECTIVE:  Note: Objective measures were completed at Evaluation unless otherwise noted.  DIAGNOSTIC FINDINGS:   PATIENT SURVEYS:  LEFS: 31/80=39%  ability  COGNITION: Overall cognitive status: Within functional limits for tasks assessed     SENSATION: WFL  EDEMA:  Present for the L knee  POSTURE: rounded shoulders, forward head, and flexed trunk   PALPATION: TTP of the L peri-knee especially medially  LOWER EXTREMITY ROM:  Active ROM Right eval Left eval  Hip flexion    Hip extension    Hip abduction    Hip adduction    Hip internal rotation    Hip external rotation    Knee flexion 90   Knee extension 20 lacking    Ankle dorsiflexion    Ankle plantarflexion    Ankle inversion    Ankle eversion     (Blank rows = not tested)  LOWER EXTREMITY MMT:  MMT Right eval Left eval  Hip flexion 3   Hip extension    Hip abduction 3   Hip adduction    Hip internal rotation    Hip external rotation 3   Knee flexion 3   Knee extension 3   Ankle  dorsiflexion    Ankle plantarflexion    Ankle inversion    Ankle eversion     (Blank rows = not tested)   FUNCTIONAL TESTS:  5 times sit to stand: TBA 2 minute walk test: TBA  GAIT: Distance walked: 200' Assistive device utilized: Environmental consultant - 2 wheeled Level of assistance: Modified independence Comments: Decreased pace                                                                                                                                TREATMENT DATE:   [facility] Adult PT Treatment:                                                DATE: 09/09/2023   Therapeutic Exercise: Supine heel slides, use of slide board and stretching strep, 2 x 10 with 3-5 sec hold  Supine quad sets with towel behind knee, 2 x 10, 3-5 sec hold  Supine long-duration knee extension  Concurrent with patient education and manual therapy  Reviewed HEP   Manual Therapy: Light pressure from ankle to mid thigh for facilitation of lymphatic drainage Concurrent with long-duration knee extension stretch  Modalities: Vasopneumatic Compression  10 minutes, low compression, 36 deg Leg elevated  on bolster  Therapeutic Activity  Patient education regarding post op expectations, progress, prognosis, swelling/edema management    [facility] Adult PT Treatment:                                                DATE: 09/02/23 Therapeutic Exercise: Developed, instructed in, and pt completed therex as noted in HEP  Self Care: RICE for symptom management    PATIENT EDUCATION:  Education details: Eval findings, POC, HEP, self care  Person educated: Patient Education method: Explanation, Demonstration, Tactile cues, Verbal cues, and Handouts Education comprehension: verbalized understanding, returned demonstration, verbal cues required, and tactile cues required  HOME EXERCISE PROGRAM: Access Code: [email or web address] URL: [email or web address] Date: 09/02/2023 Prepared by: Dasie Daft  Exercises - Supine Quad Set  - 2 x daily - 7 x weekly - 1 sets - 10 reps - 5 hold - Active Straight Leg Raise with Quad Set  - 2 x daily - 7 x weekly - 2 sets - 10 reps - 3 hold - Supine Heel Slide  - 2 x daily - 7 x weekly - 1 sets - 10 reps - 5 hold - Supine Heel Slide with Strap  - 2 x daily - 7 x weekly - 1 sets - 5 reps - 20 hold - Seated Table Hamstring Stretch  - 2 x daily - 7 x weekly - 1 sets - 5 reps - 30 hold -  Seated Long Arc Quad  - 2 x daily - 7 x weekly - 1 sets - 10 reps - 5 hold  ASSESSMENT:  CLINICAL IMPRESSION: 09/10/2023 Nai had good tolerance of initial treatment session. Tactile and Verbal cues required for instruction. Patient demonstrating improvement of knee extension AROM to lacking only 5 degrees of movement. Use of Vasopneumatic Compression at end of session for edema management, which patient responds well to. Patient requires ongoing skilled PT intervention to address current impairments and related functional deficits. We will continue to progress as tolerated.    OBJECTIVE IMPAIRMENTS: decreased activity tolerance, difficulty walking, decreased ROM, decreased strength,  increased edema, obesity, and pain.   ACTIVITY LIMITATIONS: carrying, lifting, bending, sitting, standing, squatting, sleeping, stairs, transfers, bed mobility, bathing, dressing, hygiene/grooming, locomotion level, and caring for others  PARTICIPATION LIMITATIONS: meal prep, cleaning, laundry, driving, shopping, and community activity  PERSONAL FACTORS: Fitness, Past/current experiences, Time since onset of injury/illness/exacerbation, and 1 comorbidity: high BMI are also affecting patient's functional outcome.   REHAB POTENTIAL: Good  CLINICAL DECISION MAKING: Evolving/moderate complexity  EVALUATION COMPLEXITY: Moderate   GOALS:  SHORT TERM GOALS: Target date: 09/18/23 Pt will be Ind in an initial HEP  Baseline:started Goal status: INITIAL  2.  Pt will demonstrate AROM for the L knee for 10-100d to progress toward functional ROM  Baseline: 20-90 Goal status: INITIAL  LONG TERM GOALS: Target date: 11/06/23  Pt will be Ind in a final HEP to maintain achieved LOF  Baseline:  Goal status: INITIAL  2.  Pt will demonstrate AROM for the L knee for 0-115d for appropriate functional use with sitting and negotiating steps Baseline: 20-90 Goal status: INITIAL  3.  Pt will demonstrate L knee and hip strength of 4/5 or greater for appropriate functional mobility Baseline: 3/5 Goal status: INITIAL  4.  Improve 5xSTS by MCID of 5 and by MCID of 109ft as indication of improved functional mobility  Baseline: TBA when pt is able to walk without the assistance of a SPC Goal status: INITIAL  5.  Pt will be able to walk Indly 587ft and negotiate 12 steps for community ambulation Baseline:  Goal status: INITIAL  6.  Pt's LEFS score will improve by the MCID to 59% % as indication of improved function  Baseline: 39% Goal status: INITIAL   PLAN:  PT FREQUENCY: 2x/week  PT DURATION: 8 weeks  PLANNED INTERVENTIONS: 97164- PT Re-evaluation, 97110-Therapeutic exercises, 97530-  Therapeutic activity, 97112- Neuromuscular re-education, 97535- Self Care, 02859- Manual therapy, [id number]- Gait training, [id number]- Electrical stimulation (unattended), 97016- Vasopneumatic device, 20560 (1-2 muscles), 20561 (3+ muscles)- Dry Needling, Patient/Family education, Balance training, Stair training, Taping, Joint mobilization, Cryotherapy, and Moist heat  PLAN FOR NEXT SESSION: Assess 5xSTS and when pt is able to walk without SPC assistance; assess response to HEP; progress therex as indicated; use of modalities, manual therapy; and TPDN as indicated.   [name], PT, DPT  09/10/2023 9:29 AM

## 2023-09-11 ENCOUNTER — Ambulatory Visit

## 2023-09-11 DIAGNOSIS — R262 Difficulty in walking, not elsewhere classified: Secondary | ICD-10-CM

## 2023-09-11 DIAGNOSIS — M25562 Pain in left knee: Secondary | ICD-10-CM

## 2023-09-11 DIAGNOSIS — R600 Localized edema: Secondary | ICD-10-CM

## 2023-09-11 NOTE — Therapy (Signed)
 OUTPATIENT PHYSICAL THERAPY NOTE   Patient Name: Diane Barker MRN: [id number] DOB:[birth year], 73 y.o., female Today's Date: 09/11/2023  END OF SESSION:      Past Medical History:  Diagnosis Date   Anxiety    Arthritis    all over my body (01/15/2018)   Breast cancer, right breast (HCC) 2000   Rt lumpectomy, Chemo/XRT/rt axillary node    Bronchial asthma    Chronic lower back pain    Depression    Diverticulosis    DVT (deep venous thrombosis) (HCC) 06/2013   RLE   GERD (gastroesophageal reflux disease)    High cholesterol    Hx of adenomatous colonic polyps    Hypertension    Obesity    Osteoarthritis    Personal history of chemotherapy    Personal history of radiation therapy    Pulmonary embolism (HCC) 06/2013   both lungs   Spondylolisthesis    Past Surgical History:  Procedure Laterality Date   BREAST BIOPSY Right 2000   BREAST LUMPECTOMY Right 2000   for CA txed w/ chemo and radiation, right   CARDIAC CATHETERIZATION  03/21/2020   CORONARY ATHERECTOMY N/A 04/03/2022   Procedure: CORONARY ATHERECTOMY;  Surgeon: Swaziland, Peter M, MD;  Location: [facility] INVAS[facility];  Service: Cardiovascular;  Laterality: N/A;   CORONARY IMAGING/OCT N/A 04/03/2022   Procedure: INTRAVASCULAR IMAGING/OCT;  Surgeon: Swaziland, Peter M, MD;  Location: [facility] INVASIVE CV LAB;  Service: Cardiovascular;  Laterality: N/A;   CORONARY PRESSURE/FFR STUDY N/A 01/18/2018   Procedure: INTRAVASCULAR PRESSURE WIRE/FFR STUDY;  Surgeon: Verlin Lonni BIRCH, MD;  Location: [facility];  Service: Cardiovascular;  Laterality: N/A;   CORONARY PRESSURE/FFR STUDY N/A 04/03/2022   Procedure: INTRAVASCULAR PRESSURE WIRE/FFR STUDY;  Surgeon: Swaziland, Peter M, MD;  Location: [facility] INVASIVE CV LAB;  Service: Cardiovascular;  Laterality: N/A;   CORONARY STENT INTERVENTION N/A 04/03/2022   Procedure: CORONARY STENT INTERVENTION;  Surgeon: Swaziland, Peter M, MD;  Location: [facility] INVASIVE CV LAB;  Service: Cardiovascular;   Laterality: N/A;   HERNIA REPAIR     LEFT HEART CATH AND CORONARY ANGIOGRAPHY N/A 01/18/2018   Procedure: LEFT HEART CATH AND CORONARY ANGIOGRAPHY;  Surgeon: Verlin Lonni BIRCH, MD;  Location: [facility];  Service: Cardiovascular;  Laterality: N/A;   LEFT HEART CATH AND CORONARY ANGIOGRAPHY N/A 03/21/2020   Procedure: LEFT HEART CATH AND CORONARY ANGIOGRAPHY;  Surgeon: Darron Deatrice LABOR, MD;  Location: [facility];  Service: Cardiovascular;  Laterality: N/A;   LEFT HEART CATH AND CORONARY ANGIOGRAPHY N/A 04/03/2022   Procedure: LEFT HEART CATH AND CORONARY ANGIOGRAPHY;  Surgeon: Swaziland, Peter M, MD;  Location: [facility] INVASIVE CV LAB;  Service: Cardiovascular;  Laterality: N/A;   TOTAL KNEE ARTHROPLASTY Left 08/11/2023   Procedure: LEFT TOTAL KNEE ARTHROPLASTY;  Surgeon: Vernetta Lonni GRADE, MD;  Location: [facility] OR;  Service: Orthopedics;  Laterality: Left;   TUBAL LIGATION     UMBILICAL HERNIA REPAIR  1983   Patient Active Problem List   Diagnosis Date Noted   Status post total left knee replacement 08/11/2023   Unilateral primary osteoarthritis, left knee 08/10/2023   Hyperlipidemia 08/01/2020   Asthmatic bronchitis, mild persistent, uncomplicated    Unstable angina (HCC) 01/16/2018   Cellulitis of right arm 10/07/2016   Sepsis (HCC) 10/07/2016   Chest pain 10/07/2016   Dyspnea on exertion 05/24/2015   DVT, lower extremity (HCC) 05/09/2014   Right calf pain 06/21/2013   Pulmonary embolism (HCC) 06/21/2013   Dysphagia  06/27/2011   Cough 06/27/2011   CHEST PAIN 01/18/2009   RHINITIS 04/13/2007   Obstructive chronic bronchitis with acute bronchitis (HCC) 04/13/2007   GERD 04/13/2007   Depression 01/21/2007   OSTEOARTHRITIS 01/21/2007   LOW BACK PAIN 01/21/2007   BREAST CANCER, HX OF 01/21/2007    PCP: Onita Rush, MD  REFERRING PROVIDER: Vernetta Lonni GRADE, MD  REFERRING DIAG: [phone] (ICD-10-CM) - Status post total left knee replacement   THERAPY DIAG:   No diagnosis found.  Rationale for Evaluation and Treatment: Rehabilitation  ONSET DATE: 08/11/23 TKA  SUBJECTIVE:   SUBJECTIVE STATEMENT:  09/11/2023 Patient reports that she had pain her knee last night. She ambulated without RW today, stating she is no longer using it, because she feels fine walking without it.   Eval: Pt reports she is doing well with her knee following surgery. Pt notes her L foot at times feels cold and the top of her L foot will hurt. Following her hospitalization, the pt reports she received therapy at [facility] SNF for approx 1 week.  PERTINENT HISTORY: High BMI, arthritis, spondilolisthesis  PAIN:  Are you having pain? Yes: NPRS scale: 2-10/10 Pain location: Medial L knee Pain description: soreness Aggravating factors: Activity level Relieving factors: pain medication, moving, cold pack  PRECAUTIONS: Knee  RED FLAGS: None   WEIGHT BEARING RESTRICTIONS: No  FALLS:  Has patient fallen in last 6 months? No  LIVING ENVIRONMENT: Lives with: lives alone but daughter is helping Lives in: House/apartment Stairs: Yes: External: 2 steps; none Has following equipment at home: Walker - 2 wheeled, shower chair, and bed side commode  OCCUPATION: Retired  PLOF: Independent  PATIENT GOALS: Good use of my L knee  NEXT MD VISIT: 09/24/23  OBJECTIVE:  Note: Objective measures were completed at Evaluation unless otherwise noted.  DIAGNOSTIC FINDINGS:   PATIENT SURVEYS:  LEFS: 31/80=39% ability  COGNITION: Overall cognitive status: Within functional limits for tasks assessed     SENSATION: WFL  EDEMA:  Present for the L knee  POSTURE: rounded shoulders, forward head, and flexed trunk   PALPATION: TTP of the L peri-knee especially medially  LOWER EXTREMITY ROM:  Active ROM Right eval Left eval  Hip flexion    Hip extension    Hip abduction    Hip adduction    Hip internal rotation    Hip external rotation    Knee flexion 90    Knee extension 20 lacking    Ankle dorsiflexion    Ankle plantarflexion    Ankle inversion    Ankle eversion     (Blank rows = not tested)  LOWER EXTREMITY MMT:  MMT Right eval Left eval  Hip flexion 3   Hip extension    Hip abduction 3   Hip adduction    Hip internal rotation    Hip external rotation 3   Knee flexion 3   Knee extension 3   Ankle dorsiflexion    Ankle plantarflexion    Ankle inversion    Ankle eversion     (Blank rows = not tested)   FUNCTIONAL TESTS:  5 times sit to stand: TBA 2 minute walk test: TBA  GAIT: Distance walked: 200' Assistive device utilized: Environmental consultant - 2 wheeled Level of assistance: Modified independence Comments: Decreased pace  TREATMENT DATE:    [facility] Adult PT Treatment:                                                DATE: 09/11/2023  Therapeutic Exercise: NuStep x 5 minutes, level 3 for AAROM  Supine heel slides, use of slide board and stretching strep, 2 x 10 with 3-5 sec hold  Supine quad sets with towel behind knee, 2 x 10, 3-5 sec hold  Supine long-duration knee extension  Concurrent with vasopneumatic compression Reviewed HEP   Manual Therapy: Light pressure from ankle to mid thigh for facilitation of lymphatic drainage Concurrent with long-duration knee extension stretch  Modalities: Vasopneumatic Compression  10 minutes, low compression, 34 deg Leg elevated on bolster  Therapeutic Activity  Patient education regarding post op expectations, progress, prognosis, swelling/edema management   [facility] Adult PT Treatment:                                                DATE: 09/09/2023   Therapeutic Exercise: Supine heel slides, use of slide board and stretching strep, 2 x 10 with 3-5 sec hold  Supine quad sets with towel behind knee, 2 x 10, 3-5 sec hold  Supine long-duration knee extension   Concurrent with patient education and manual therapy  Reviewed HEP   Manual Therapy: Light pressure from ankle to mid thigh for facilitation of lymphatic drainage Concurrent with long-duration knee extension stretch  Modalities: Vasopneumatic Compression  10 minutes, low compression, 36 deg Leg elevated on bolster  Therapeutic Activity  Patient education regarding post op expectations, progress, prognosis, swelling/edema management    [facility] Adult PT Treatment:                                                DATE: 09/02/23 Therapeutic Exercise: Developed, instructed in, and pt completed therex as noted in HEP  Self Care: RICE for symptom management    PATIENT EDUCATION:  Education details: Eval findings, POC, HEP, self care  Person educated: Patient Education method: Explanation, Demonstration, Tactile cues, Verbal cues, and Handouts Education comprehension: verbalized understanding, returned demonstration, verbal cues required, and tactile cues required  HOME EXERCISE PROGRAM: Access Code: [email or web address] URL: [email or web address] Date: 09/02/2023 Prepared by: Dasie Daft  Exercises - Supine Quad Set  - 2 x daily - 7 x weekly - 1 sets - 10 reps - 5 hold - Active Straight Leg Raise with Quad Set  - 2 x daily - 7 x weekly - 2 sets - 10 reps - 3 hold - Supine Heel Slide  - 2 x daily - 7 x weekly - 1 sets - 10 reps - 5 hold - Supine Heel Slide with Strap  - 2 x daily - 7 x weekly - 1 sets - 5 reps - 20 hold - Seated Table Hamstring Stretch  - 2 x daily - 7 x weekly - 1 sets - 5 reps - 30 hold - Seated Long Arc Quad  - 2 x daily - 7 x weekly - 1 sets - 10 reps - 5 hold  ASSESSMENT:  CLINICAL  IMPRESSION: 09/11/2023   Christiana had good tolerance of initial treatment session. Tactile and Verbal cues required for instruction. Patient demonstrating improvement of knee extension AROM to lacking only 5 degrees of movement. Use of Vasopneumatic Compression at end of session for  edema management, which patient responds well to. Patient requires ongoing skilled PT intervention to address current impairments and related functional deficits. We will continue to progress as tolerated.    OBJECTIVE IMPAIRMENTS: decreased activity tolerance, difficulty walking, decreased ROM, decreased strength, increased edema, obesity, and pain.   ACTIVITY LIMITATIONS: carrying, lifting, bending, sitting, standing, squatting, sleeping, stairs, transfers, bed mobility, bathing, dressing, hygiene/grooming, locomotion level, and caring for others  PARTICIPATION LIMITATIONS: meal prep, cleaning, laundry, driving, shopping, and community activity  PERSONAL FACTORS: Fitness, Past/current experiences, Time since onset of injury/illness/exacerbation, and 1 comorbidity: high BMI are also affecting patient's functional outcome.   REHAB POTENTIAL: Good  CLINICAL DECISION MAKING: Evolving/moderate complexity  EVALUATION COMPLEXITY: Moderate   GOALS:  SHORT TERM GOALS: Target date: 09/18/23 Pt will be Ind in an initial HEP  Baseline:started Goal status: INITIAL  2.  Pt will demonstrate AROM for the L knee for 10-100d to progress toward functional ROM  Baseline: 20-90 Goal status: INITIAL  LONG TERM GOALS: Target date: 11/06/23  Pt will be Ind in a final HEP to maintain achieved LOF  Baseline:  Goal status: INITIAL  2.  Pt will demonstrate AROM for the L knee for 0-115d for appropriate functional use with sitting and negotiating steps Baseline: 20-90 Goal status: INITIAL  3.  Pt will demonstrate L knee and hip strength of 4/5 or greater for appropriate functional mobility Baseline: 3/5 Goal status: INITIAL  4.  Improve 5xSTS by MCID of 5 and by MCID of 18ft as indication of improved functional mobility  Baseline: TBA when pt is able to walk without the assistance of a SPC Goal status: INITIAL  5.  Pt will be able to walk Indly 565ft and negotiate 12 steps for community  ambulation Baseline:  Goal status: INITIAL  6.  Pt's LEFS score will improve by the MCID to 59% % as indication of improved function  Baseline: 39% Goal status: INITIAL   PLAN:  PT FREQUENCY: 2x/week  PT DURATION: 8 weeks  PLANNED INTERVENTIONS: 97164- PT Re-evaluation, 97110-Therapeutic exercises, 97530- Therapeutic activity, 97112- Neuromuscular re-education, 97535- Self Care, 02859- Manual therapy, [id number]- Gait training, [phone]- Electrical stimulation (unattended), 97016- Vasopneumatic device, 20560 (1-2 muscles), 20561 (3+ muscles)- Dry Needling, Patient/Family education, Balance training, Stair training, Taping, Joint mobilization, Cryotherapy, and Moist heat  PLAN FOR NEXT SESSION: Assess 5xSTS and when pt is able to walk without SPC assistance; assess response to HEP; progress therex as indicated; use of modalities, manual therapy; and TPDN as indicated.   [name], PT, DPT  09/11/2023 12:15 PM

## 2023-09-15 ENCOUNTER — Ambulatory Visit: Attending: Orthopaedic Surgery

## 2023-09-15 DIAGNOSIS — M25562 Pain in left knee: Secondary | ICD-10-CM | POA: Insufficient documentation

## 2023-09-15 DIAGNOSIS — R262 Difficulty in walking, not elsewhere classified: Secondary | ICD-10-CM | POA: Diagnosis present

## 2023-09-15 DIAGNOSIS — M25662 Stiffness of left knee, not elsewhere classified: Secondary | ICD-10-CM | POA: Insufficient documentation

## 2023-09-15 DIAGNOSIS — R600 Localized edema: Secondary | ICD-10-CM | POA: Diagnosis present

## 2023-09-15 DIAGNOSIS — M6281 Muscle weakness (generalized): Secondary | ICD-10-CM | POA: Insufficient documentation

## 2023-09-15 NOTE — Therapy (Signed)
 OUTPATIENT PHYSICAL THERAPY NOTE   Patient Name: Diane Barker MRN: [id number] DOB:08-18-1950, 73 y.o., female Today's Date: 09/15/2023  END OF SESSION:        Past Medical History:  Diagnosis Date   Anxiety    Arthritis    all over my body (01/15/2018)   Breast cancer, right breast (HCC) 2000   Rt lumpectomy, Chemo/XRT/rt axillary node    Bronchial asthma    Chronic lower back pain    Depression    Diverticulosis    DVT (deep venous thrombosis) (HCC) 06/2013   RLE   GERD (gastroesophageal reflux disease)    High cholesterol    Hx of adenomatous colonic polyps    Hypertension    Obesity    Osteoarthritis    Personal history of chemotherapy    Personal history of radiation therapy    Pulmonary embolism (HCC) 06/2013   both lungs   Spondylolisthesis    Past Surgical History:  Procedure Laterality Date   BREAST BIOPSY Right 2000   BREAST LUMPECTOMY Right 2000   for CA txed w/ chemo and radiation, right   CARDIAC CATHETERIZATION  03/21/2020   CORONARY ATHERECTOMY N/A 04/03/2022   Procedure: CORONARY ATHERECTOMY;  Surgeon: Swaziland, Peter M, MD;  Location: [facility];  Service: Cardiovascular;  Laterality: N/A;   CORONARY IMAGING/OCT N/A 04/03/2022   Procedure: INTRAVASCULAR IMAGING/OCT;  Surgeon: Swaziland, Peter M, MD;  Location: [facility] INVASIVE CV LAB;  Service: Cardiovascular;  Laterality: N/A;   CORONARY PRESSURE/FFR STUDY N/A 01/18/2018   Procedure: INTRAVASCULAR PRESSURE WIRE/FFR STUDY;  Surgeon: Verlin Lonni BIRCH, MD;  Location: [facility] INVAS[facility] CV [facility];  Service: Cardiovascular;  Laterality: N/A;   CORONARY PRESSURE/FFR STUDY N/A 04/03/2022   Procedure: INTRAVASCULAR PRESSURE WIRE/FFR STUDY;  Surgeon: Swaziland, Peter M, MD;  Location: [facility] INVASIVE CV LAB;  Service: Cardiovascular;  Laterality: N/A;   CORONARY STENT INTERVENTION N/A 04/03/2022   Procedure: CORONARY STENT INTERVENTION;  Surgeon: Swaziland, Peter M, MD;  Location: [facility] INVASIVE CV LAB;  Service:  Cardiovascular;  Laterality: N/A;   HERNIA REPAIR     LEFT HEART CATH AND CORONARY ANGIOGRAPHY N/A 01/18/2018   Procedure: LEFT HEART CATH AND CORONARY ANGIOGRAPHY;  Surgeon: Verlin Lonni BIRCH, MD;  Location: [facility] INVAS[facility];  Service: Cardiovascular;  Laterality: N/A;   LEFT HEART CATH AND CORONARY ANGIOGRAPHY N/A 03/21/2020   Procedure: LEFT HEART CATH AND CORONARY ANGIOGRAPHY;  Surgeon: Darron Deatrice LABOR, MD;  Location: [facility]VAS[facility];  Service: Cardiovascular;  Laterality: N/A;   LEFT HEART CATH AND CORONARY ANGIOGRAPHY N/A 04/03/2022   Procedure: LEFT HEART CATH AND CORONARY ANGIOGRAPHY;  Surgeon: Swaziland, Peter M, MD;  Location: [facility] INVASIVE CV LAB;  Service: Cardiovascular;  Laterality: N/A;   TOTAL KNEE ARTHROPLASTY Left 08/11/2023   Procedure: LEFT TOTAL KNEE ARTHROPLASTY;  Surgeon: Vernetta Lonni GRADE, MD;  Location: [facility] OR;  Service: Orthopedics;  Laterality: Left;   TUBAL LIGATION     UMBILICAL HERNIA REPAIR  1983   Patient Active Problem List   Diagnosis Date Noted   Status post total left knee replacement 08/11/2023   Unilateral primary osteoarthritis, left knee 08/10/2023   Hyperlipidemia 08/01/2020   Asthmatic bronchitis, mild persistent, uncomplicated    Unstable angina (HCC) 01/16/2018   Cellulitis of right arm 10/07/2016   Sepsis (HCC) 10/07/2016   Chest pain 10/07/2016   Dyspnea on exertion 05/24/2015   DVT, lower extremity (HCC) 05/09/2014   Right calf pain 06/21/2013   Pulmonary embolism (HCC) 06/21/2013  Dysphagia 06/27/2011   Cough 06/27/2011   CHEST PAIN 01/18/2009   RHINITIS 04/13/2007   Obstructive chronic bronchitis with acute bronchitis (HCC) 04/13/2007   GERD 04/13/2007   Depression 01/21/2007   OSTEOARTHRITIS 01/21/2007   LOW BACK PAIN 01/21/2007   BREAST CANCER, HX OF 01/21/2007    PCP: Onita Rush, MD  REFERRING PROVIDER: Vernetta Lonni GRADE, MD  REFERRING DIAG: [phone] (ICD-10-CM) - Status post total left knee replacement    THERAPY DIAG:  No diagnosis found.  Rationale for Evaluation and Treatment: Rehabilitation  ONSET DATE: 08/11/23 TKA  SUBJECTIVE:   SUBJECTIVE STATEMENT:  09/15/2023 Patient reports that she feel good after last session. Continues to perform prescribed HEP  Eval: Pt reports she is doing well with her knee following surgery. Pt notes her L foot at times feels cold and the top of her L foot will hurt. Following her hospitalization, the pt reports she received therapy at [facility] SNF for approx 1 week.  PERTINENT HISTORY: High BMI, arthritis, spondilolisthesis  PAIN:  Are you having pain? Yes: NPRS scale: 2-10/10 Pain location: Medial L knee Pain description: soreness Aggravating factors: Activity level Relieving factors: pain medication, moving, cold pack  PRECAUTIONS: Knee  RED FLAGS: None   WEIGHT BEARING RESTRICTIONS: No  FALLS:  Has patient fallen in last 6 months? No  LIVING ENVIRONMENT: Lives with: lives alone but daughter is helping Lives in: House/apartment Stairs: Yes: External: 2 steps; none Has following equipment at home: Walker - 2 wheeled, shower chair, and bed side commode  OCCUPATION: Retired  PLOF: Independent  PATIENT GOALS: Good use of my L knee  NEXT MD VISIT: 09/24/23  OBJECTIVE:  Note: Objective measures were completed at Evaluation unless otherwise noted.  DIAGNOSTIC FINDINGS:   PATIENT SURVEYS:  LEFS: 31/80=39% ability  COGNITION: Overall cognitive status: Within functional limits for tasks assessed     SENSATION: WFL  EDEMA:  Present for the L knee  POSTURE: rounded shoulders, forward head, and flexed trunk   PALPATION: TTP of the L peri-knee especially medially  LOWER EXTREMITY ROM:  Active ROM Left eval Left eval  Hip flexion    Hip extension    Hip abduction    Hip adduction    Hip internal rotation    Hip external rotation    Knee flexion 90 95  Knee extension 20 lacking    Ankle dorsiflexion    Ankle  plantarflexion    Ankle inversion    Ankle eversion     (Blank rows = not tested)  LOWER EXTREMITY MMT:  MMT Right eval Left eval  Hip flexion 3   Hip extension    Hip abduction 3   Hip adduction    Hip internal rotation    Hip external rotation 3   Knee flexion 3   Knee extension 3   Ankle dorsiflexion    Ankle plantarflexion    Ankle inversion    Ankle eversion     (Blank rows = not tested)   FUNCTIONAL TESTS:  5 times sit to stand: TBA 2 minute walk test: TBA  GAIT: Distance walked: 200' Assistive device utilized: Environmental consultant - 2 wheeled Level of assistance: Modified independence Comments: Decreased pace  TREATMENT DATE:    [facility] Adult PT Treatment:                                                DATE: 09/15/2023   Therapeutic Exercise: NuStep x 5 minutes, level 3 for AAROM  Supine heel slides, use of slide board and stretching strep, 2 x 10 with 3-5 sec hold  Supine quad sets with towel behind knee, 2 x 10, 3-5 sec hold  LAQ STS 2 x 10 Reviewed HEP    [facility] Adult PT Treatment:                                                DATE: 09/09/2023   Therapeutic Exercise: Supine heel slides, use of slide board and stretching strep, 2 x 10 with 3-5 sec hold  Passive knee flexion ROM, 5 sec hold Supine quad sets with towel behind knee, 2 x 10, 3-5 sec hold  Supine long-duration knee extension  Concurrent with patient education and manual therapy  Reviewed HEP   Manual Therapy: Light pressure from ankle to mid thigh for facilitation of lymphatic drainage Concurrent with long-duration knee extension stretch  Modalities: Vasopneumatic Compression  10 minutes, low compression, 36 deg Leg elevated on wedge x 6 minutes, wedge removed after 6 minutes d/t inability to tolerate today  Therapeutic Activity  Patient education regarding post op  expectations, progress, prognosis, swelling/edema management    [facility] Adult PT Treatment:                                                DATE: 09/02/23 Therapeutic Exercise: Developed, instructed in, and pt completed therex as noted in HEP  Self Care: RICE for symptom management    PATIENT EDUCATION:  Education details: Eval findings, POC, HEP, self care  Person educated: Patient Education method: Explanation, Demonstration, Tactile cues, Verbal cues, and Handouts Education comprehension: verbalized understanding, returned demonstration, verbal cues required, and tactile cues required  HOME EXERCISE PROGRAM: Access Code: [email or web address] URL: [email or web address] Date: 09/02/2023 Prepared by: Dasie Daft  Exercises - Supine Quad Set  - 2 x daily - 7 x weekly - 1 sets - 10 reps - 5 hold - Active Straight Leg Raise with Quad Set  - 2 x daily - 7 x weekly - 2 sets - 10 reps - 3 hold - Supine Heel Slide  - 2 x daily - 7 x weekly - 1 sets - 10 reps - 5 hold - Supine Heel Slide with Strap  - 2 x daily - 7 x weekly - 1 sets - 5 reps - 20 hold - Seated Table Hamstring Stretch  - 2 x daily - 7 x weekly - 1 sets - 5 reps - 30 hold - Seated Long Arc Quad  - 2 x daily - 7 x weekly - 1 sets - 10 reps - 5 hold  ASSESSMENT:  CLINICAL IMPRESSION: 09/15/2023 ***    OBJECTIVE IMPAIRMENTS: decreased activity tolerance, difficulty walking, decreased ROM, decreased strength, increased edema, obesity, and pain.   ACTIVITY LIMITATIONS: carrying, lifting, bending, sitting, standing, squatting, sleeping,  stairs, transfers, bed mobility, bathing, dressing, hygiene/grooming, locomotion level, and caring for others  PARTICIPATION LIMITATIONS: meal prep, cleaning, laundry, driving, shopping, and community activity  PERSONAL FACTORS: Fitness, Past/current experiences, Time since onset of injury/illness/exacerbation, and 1 comorbidity: high BMI are also affecting patient's functional outcome.    REHAB POTENTIAL: Good  CLINICAL DECISION MAKING: Evolving/moderate complexity  EVALUATION COMPLEXITY: Moderate   GOALS:  SHORT TERM GOALS: Target date: 09/18/23 Pt will be Ind in an initial HEP  Baseline:started Goal status: INITIAL  2.  Pt will demonstrate AROM for the L knee for 10-100d to progress toward functional ROM  Baseline: 20-90 Goal status: INITIAL  LONG TERM GOALS: Target date: 11/06/23  Pt will be Ind in a final HEP to maintain achieved LOF  Baseline:  Goal status: INITIAL  2.  Pt will demonstrate AROM for the L knee for 0-115d for appropriate functional use with sitting and negotiating steps Baseline: 20-90 Goal status: INITIAL  3.  Pt will demonstrate L knee and hip strength of 4/5 or greater for appropriate functional mobility Baseline: 3/5 Goal status: INITIAL  4.  Improve 5xSTS by MCID of 5 and by MCID of 85ft as indication of improved functional mobility  Baseline: TBA when pt is able to walk without the assistance of a SPC Goal status: INITIAL  5.  Pt will be able to walk Indly 544ft and negotiate 12 steps for community ambulation Baseline:  Goal status: INITIAL  6.  Pt's LEFS score will improve by the MCID to 59% % as indication of improved function  Baseline: 39% Goal status: INITIAL   PLAN:  PT FREQUENCY: 2x/week  PT DURATION: 8 weeks  PLANNED INTERVENTIONS: 97164- PT Re-evaluation, 97110-Therapeutic exercises, 97530- Therapeutic activity, 97112- Neuromuscular re-education, 97535- Self Care, 02859- Manual therapy, [id number]- Gait training, [phone]- Electrical stimulation (unattended), 97016- Vasopneumatic device, 20560 (1-2 muscles), 20561 (3+ muscles)- Dry Needling, Patient/Family education, Balance training, Stair training, Taping, Joint mobilization, Cryotherapy, and Moist heat  PLAN FOR NEXT SESSION: Assess 5xSTS and when pt is able to walk without SPC assistance; assess response to HEP; progress therex as indicated; use of  modalities, manual therapy; and TPDN as indicated.   [name], PT, DPT  09/15/2023 2:03 PM

## 2023-09-17 ENCOUNTER — Ambulatory Visit: Admitting: Physical Therapy

## 2023-09-17 ENCOUNTER — Encounter: Payer: Self-pay | Admitting: Physical Therapy

## 2023-09-17 DIAGNOSIS — R262 Difficulty in walking, not elsewhere classified: Secondary | ICD-10-CM

## 2023-09-17 DIAGNOSIS — M25562 Pain in left knee: Secondary | ICD-10-CM | POA: Diagnosis not present

## 2023-09-17 NOTE — Therapy (Signed)
 OUTPATIENT PHYSICAL THERAPY NOTE   Patient Name: Diane Barker MRN: [id number] DOB:1950/04/28, 73 y.o., female Today's Date: 09/17/2023  END OF SESSION:  PT End of Session - 09/17/23 1309     Visit Number 5    Number of Visits 17    Date for PT Re-Evaluation 11/06/23    Authorization Type [employer] DUAL COMPLETE    PT Start Time 1315    PT Stop Time 1400    PT Time Calculation (min) 45 min                 Past Medical History:  Diagnosis Date   Anxiety    Arthritis    all over my body (01/15/2018)   Breast cancer, right breast (HCC) 2000   Rt lumpectomy, Chemo/XRT/rt axillary node    Bronchial asthma    Chronic lower back pain    Depression    Diverticulosis    DVT (deep venous thrombosis) (HCC) 06/2013   RLE   GERD (gastroesophageal reflux disease)    High cholesterol    Hx of adenomatous colonic polyps    Hypertension    Obesity    Osteoarthritis    Personal history of chemotherapy    Personal history of radiation therapy    Pulmonary embolism (HCC) 06/2013   both lungs   Spondylolisthesis    Past Surgical History:  Procedure Laterality Date   BREAST BIOPSY Right 2000   BREAST LUMPECTOMY Right 2000   for CA txed w/ chemo and radiation, right   CARDIAC CATHETERIZATION  03/21/2020   CORONARY ATHERECTOMY N/A 04/03/2022   Procedure: CORONARY ATHERECTOMY;  Surgeon: Swaziland, Peter M, MD;  Location: [facility] INVASIVE CV [facility];  Service: Cardiovascular;  Laterality: N/A;   CORONARY IMAGING/OCT N/A 04/03/2022   Procedure: INTRAVASCULAR IMAGING/OCT;  Surgeon: Swaziland, Peter M, MD;  Location: [facility] INVASIVE CV LAB;  Service: Cardiovascular;  Laterality: N/A;   CORONARY PRESSURE/FFR STUDY N/A 01/18/2018   Procedure: INTRAVASCULAR PRESSURE WIRE/FFR STUDY;  Surgeon: Verlin Lonni BIRCH, MD;  Location: [facility] CV [facility];  Service: Cardiovascular;  Laterality: N/A;   CORONARY PRESSURE/FFR STUDY N/A 04/03/2022   Procedure: INTRAVASCULAR PRESSURE WIRE/FFR STUDY;   Surgeon: Swaziland, Peter M, MD;  Location: [facility] INVASIVE CV LAB;  Service: Cardiovascular;  Laterality: N/A;   CORONARY STENT INTERVENTION N/A 04/03/2022   Procedure: CORONARY STENT INTERVENTION;  Surgeon: Swaziland, Peter M, MD;  Location: [facility] INVASIVE CV LAB;  Service: Cardiovascular;  Laterality: N/A;   HERNIA REPAIR     LEFT HEART CATH AND CORONARY ANGIOGRAPHY N/A 01/18/2018   Procedure: LEFT HEART CATH AND CORONARY ANGIOGRAPHY;  Surgeon: Verlin Lonni BIRCH, MD;  Location: [facility] IN[facility] CV [facility];  Service: Cardiovascular;  Laterality: N/A;   LEFT HEART CATH AND CORONARY ANGIOGRAPHY N/A 03/21/2020   Procedure: LEFT HEART CATH AND CORONARY ANGIOGRAPHY;  Surgeon: Darron Deatrice LABOR, MD;  Location: [facility] IN[facility] CV [facility];  Service: Cardiovascular;  Laterality: N/A;   LEFT HEART CATH AND CORONARY ANGIOGRAPHY N/A 04/03/2022   Procedure: LEFT HEART CATH AND CORONARY ANGIOGRAPHY;  Surgeon: Swaziland, Peter M, MD;  Location: [facility] INVASIVE CV LAB;  Service: Cardiovascular;  Laterality: N/A;   TOTAL KNEE ARTHROPLASTY Left 08/11/2023   Procedure: LEFT TOTAL KNEE ARTHROPLASTY;  Surgeon: Vernetta Lonni GRADE, MD;  Location: [facility] OR;  Service: Orthopedics;  Laterality: Left;   TUBAL LIGATION     UMBILICAL HERNIA REPAIR  1983   Patient Active Problem List   Diagnosis Date Noted   Status post total left knee replacement  08/11/2023   Unilateral primary osteoarthritis, left knee 08/10/2023   Hyperlipidemia 08/01/2020   Asthmatic bronchitis, mild persistent, uncomplicated    Unstable angina (HCC) 01/16/2018   Cellulitis of right arm 10/07/2016   Sepsis (HCC) 10/07/2016   Chest pain 10/07/2016   Dyspnea on exertion 05/24/2015   DVT, lower extremity (HCC) 05/09/2014   Right calf pain 06/21/2013   Pulmonary embolism (HCC) 06/21/2013   Dysphagia 06/27/2011   Cough 06/27/2011   CHEST PAIN 01/18/2009   RHINITIS 04/13/2007   Obstructive chronic bronchitis with acute bronchitis (HCC) 04/13/2007   GERD 04/13/2007   Depression  01/21/2007   OSTEOARTHRITIS 01/21/2007   LOW BACK PAIN 01/21/2007   BREAST CANCER, HX OF 01/21/2007    PCP: Onita Rush, MD  REFERRING PROVIDER: Vernetta Lonni GRADE, MD  REFERRING DIAG: [phone] (ICD-10-CM) - Status post total left knee replacement   THERAPY DIAG:  Acute pain of left knee  Difficulty in walking, not elsewhere classified  Rationale for Evaluation and Treatment: Rehabilitation  ONSET DATE: 08/11/23 TKA  SUBJECTIVE:   SUBJECTIVE STATEMENT:  09/17/2023 Patient reports that she over did it with walking on unlevel ground at a friend's house yesterday and had a rough night. Better now.   Eval: Pt reports she is doing well with her knee following surgery. Pt notes her L foot at times feels cold and the top of her L foot will hurt. Following her hospitalization, the pt reports she received therapy at [facility] for approx 1 week.  PERTINENT HISTORY: High BMI, arthritis, spondilolisthesis  PAIN:  Are you having pain? Yes: NPRS scale: 2-10/10 Pain location: Medial L knee Pain description: soreness Aggravating factors: Activity level Relieving factors: pain medication, moving, cold pack  PRECAUTIONS: Knee  RED FLAGS: None   WEIGHT BEARING RESTRICTIONS: No  FALLS:  Has patient fallen in last 6 months? No  LIVING ENVIRONMENT: Lives with: lives alone but daughter is helping Lives in: House/apartment Stairs: Yes: External: 2 steps; none Has following equipment at home: Walker - 2 wheeled, shower chair, and bed side commode  OCCUPATION: Retired  PLOF: Independent  PATIENT GOALS: Good use of my L knee  NEXT MD VISIT: 09/24/23  OBJECTIVE:  Note: Objective measures were completed at Evaluation unless otherwise noted.  DIAGNOSTIC FINDINGS:   PATIENT SURVEYS:  LEFS: 31/80=39% ability  COGNITION: Overall cognitive status: Within functional limits for tasks assessed     SENSATION: WFL  EDEMA:  Present for the L knee  POSTURE: rounded  shoulders, forward head, and flexed trunk   PALPATION: TTP of the L peri-knee especially medially  LOWER EXTREMITY ROM:  Active ROM Left eval Left eval Left  09/17/23  Hip flexion     Hip extension     Hip abduction     Hip adduction     Hip internal rotation     Hip external rotation     Knee flexion 90 95 102  Knee extension 20 lacking     Ankle dorsiflexion     Ankle plantarflexion     Ankle inversion     Ankle eversion      (Blank rows = not tested)  LOWER EXTREMITY MMT:  MMT Right eval Left eval  Hip flexion 3   Hip extension    Hip abduction 3   Hip adduction    Hip internal rotation    Hip external rotation 3   Knee flexion 3   Knee extension 3   Ankle dorsiflexion    Ankle plantarflexion  Ankle inversion    Ankle eversion     (Blank rows = not tested)   FUNCTIONAL TESTS:  5 times sit to stand: TBA 2 minute walk test: TBA  GAIT: Distance walked: 200' Assistive device utilized: Environmental consultant - 2 wheeled Level of assistance: Modified independence Comments: Decreased pace                                                                                                                                TREATMENT DATE:  [facility] Adult PT Treatment:                                                DATE: 09/17/23 Therapeutic Exercise: Nustep 5 minutes  LAQ 2# 10 x 2  Seated h/s curl  red 10 x 2  Supine QS/TKE 10 x 2  SLR with initial QS x 10 Passive knee flexion Passive knee ext Passive hamstring stretch  Manual Therapy: Seated knee flexion mobs, supine knee ext mobs Therapeutic Activity: 2 MWT 5 x STS STS 10 x 2  Supine heel slides, use of slide board and stretching strep, 2 x 10 with 3-5 sec hold  Modalities: Vasopneumatic Compression  10 minutes, low compression, 38 deg Leg elevated on wedge x 10   [facility] Adult PT Treatment:                                                DATE: 09/15/2023  Therapeutic Exercise: NuStep x 5 minutes, level 3 for AAROM   Supine heel slides, use of slide board and stretching strep, 2 x 10 with 3-5 sec hold  Supine quad sets with towel behind knee, 2 x 10, 3-5 sec hold  LAQ 3 sec, 2 x 10  STS 2 x 10 PROM knee flexion x 20     [facility] Adult PT Treatment:                                                DATE: 09/09/2023   Therapeutic Exercise: Supine heel slides, use of slide board and stretching strep, 2 x 10 with 3-5 sec hold  Passive knee flexion ROM, 5 sec hold Supine quad sets with towel behind knee, 2 x 10, 3-5 sec hold  Supine long-duration knee extension  Concurrent with patient education and manual therapy  Reviewed HEP   Manual Therapy: Light pressure from ankle to mid thigh for facilitation of lymphatic drainage Concurrent with long-duration knee extension stretch  Modalities: Vasopneumatic Compression  10 minutes, low compression, 36 deg Leg elevated  on wedge x 6 minutes, wedge removed after 6 minutes d/t inability to tolerate today  Therapeutic Activity  Patient education regarding post op expectations, progress, prognosis, swelling/edema management    [facility] Adult PT Treatment:                                                DATE: 09/02/23 Therapeutic Exercise: Developed, instructed in, and pt completed therex as noted in HEP  Self Care: RICE for symptom management    PATIENT EDUCATION:  Education details: Eval findings, POC, HEP, self care  Person educated: Patient Education method: Explanation, Demonstration, Tactile cues, Verbal cues, and Handouts Education comprehension: verbalized understanding, returned demonstration, verbal cues required, and tactile cues required  HOME EXERCISE PROGRAM: Access Code: [email or web address] URL: [email or web address] Date: 09/02/2023 Prepared by: Dasie Daft  Exercises - Supine Quad Set  - 2 x daily - 7 x weekly - 1 sets - 10 reps - 5 hold - Active Straight Leg Raise with Quad Set  - 2 x daily - 7 x weekly - 2 sets - 10 reps - 3 hold -  Supine Heel Slide  - 2 x daily - 7 x weekly - 1 sets - 10 reps - 5 hold - Supine Heel Slide with Strap  - 2 x daily - 7 x weekly - 1 sets - 5 reps - 20 hold - Seated Table Hamstring Stretch  - 2 x daily - 7 x weekly - 1 sets - 5 reps - 30 hold - Seated Long Arc Quad  - 2 x daily - 7 x weekly - 1 sets - 10 reps - 5 hold  ASSESSMENT:  CLINICAL IMPRESSION: 09/17/2023 Sabriya had good tolerance of today's treatment session, which focused on ROM and strengthening. Captured baseline functional tests. Patient has improved knee flexion AROM. We will continue to progress per POC as tolerated, in order to reach established rehab goals. Vaso used at end of session to reduce edema.      OBJECTIVE IMPAIRMENTS: decreased activity tolerance, difficulty walking, decreased ROM, decreased strength, increased edema, obesity, and pain.   ACTIVITY LIMITATIONS: carrying, lifting, bending, sitting, standing, squatting, sleeping, stairs, transfers, bed mobility, bathing, dressing, hygiene/grooming, locomotion level, and caring for others  PARTICIPATION LIMITATIONS: meal prep, cleaning, laundry, driving, shopping, and community activity  PERSONAL FACTORS: Fitness, Past/current experiences, Time since onset of injury/illness/exacerbation, and 1 comorbidity: high BMI are also affecting patient's functional outcome.   REHAB POTENTIAL: Good  CLINICAL DECISION MAKING: Evolving/moderate complexity  EVALUATION COMPLEXITY: Moderate   GOALS:  SHORT TERM GOALS: Target date: 09/18/23 Pt will be Ind in an initial HEP  Baseline:started Goal status: INITIAL  2.  Pt will demonstrate AROM for the L knee for 10-100d to progress toward functional ROM  Baseline: 20-90 Goal status: INITIAL  LONG TERM GOALS: Target date: 11/06/23  Pt will be Ind in a final HEP to maintain achieved LOF  Baseline:  Goal status: INITIAL  2.  Pt will demonstrate AROM for the L knee for 0-115d for appropriate functional use with sitting and  negotiating steps Baseline: 20-90 Goal status: INITIAL  3.  Pt will demonstrate L knee and hip strength of 4/5 or greater for appropriate functional mobility Baseline: 3/5 Goal status: INITIAL  4.  Improve 5xSTS by MCID of 5 and by MCID of 43ft as indication of improved functional mobility  Baseline: TBA when pt is able to walk without the assistance of a [facility] 09/17/23: 390 feet without AD, 5 x STS: 15.2 sec without UE Goal status: ONGOING  5.  Pt will be able to walk Indly 579ft and negotiate 12 steps for community ambulation Baseline:  Goal status: INITIAL  6.  Pt's LEFS score will improve by the MCID to 59% % as indication of improved function  Baseline: 39% Goal status: INITIAL   PLAN:  PT FREQUENCY: 2x/week  PT DURATION: 8 weeks  PLANNED INTERVENTIONS: 97164- PT Re-evaluation, 97110-Therapeutic exercises, 97530- Therapeutic activity, [id number]- Neuromuscular re-education, 97535- Self Care, 02859- Manual therapy, [id number]- Gait training, [phone]- Electrical stimulation (unattended), 97016- Vasopneumatic device, 20560 (1-2 muscles), 20561 (3+ muscles)- Dry Needling, Patient/Family education, Balance training, Stair training, Taping, Joint mobilization, Cryotherapy, and Moist heat  PLAN FOR NEXT SESSION:  assess response to HEP; progress therex as indicated; use of modalities, manual therapy; and TPDN as indicated.   Harlene Persons, PTA 09/17/23 1:55 PM Phone: [phone] Fax: [phone]

## 2023-09-22 ENCOUNTER — Ambulatory Visit

## 2023-09-22 DIAGNOSIS — M25562 Pain in left knee: Secondary | ICD-10-CM | POA: Diagnosis not present

## 2023-09-22 DIAGNOSIS — M6281 Muscle weakness (generalized): Secondary | ICD-10-CM

## 2023-09-22 DIAGNOSIS — R262 Difficulty in walking, not elsewhere classified: Secondary | ICD-10-CM

## 2023-09-22 DIAGNOSIS — R600 Localized edema: Secondary | ICD-10-CM

## 2023-09-22 NOTE — Therapy (Signed)
 OUTPATIENT PHYSICAL THERAPY NOTE   Patient Name: Diane Barker MRN: [id number] DOB:1950-10-24, 73 y.o., female Today's Date: 09/22/2023  END OF SESSION:  PT End of Session - 09/22/23 1401     Visit Number 6    Number of Visits 17    Date for PT Re-Evaluation 11/06/23    Authorization Type [employer] DUAL COMPLETE    PT Start Time 1401    PT Stop Time 1440    PT Time Calculation (min) 39 min    Activity Tolerance Patient tolerated treatment well    Behavior During Therapy WFL for tasks assessed/performed                  Past Medical History:  Diagnosis Date   Anxiety    Arthritis    all over my body (01/15/2018)   Breast cancer, right breast (HCC) 2000   Rt lumpectomy, Chemo/XRT/rt axillary node    Bronchial asthma    Chronic lower back pain    Depression    Diverticulosis    DVT (deep venous thrombosis) (HCC) 06/2013   RLE   GERD (gastroesophageal reflux disease)    High cholesterol    Hx of adenomatous colonic polyps    Hypertension    Obesity    Osteoarthritis    Personal history of chemotherapy    Personal history of radiation therapy    Pulmonary embolism (HCC) 06/2013   both lungs   Spondylolisthesis    Past Surgical History:  Procedure Laterality Date   BREAST BIOPSY Right 2000   BREAST LUMPECTOMY Right 2000   for CA txed w/ chemo and radiation, right   CARDIAC CATHETERIZATION  03/21/2020   CORONARY ATHERECTOMY N/A 04/03/2022   Procedure: CORONARY ATHERECTOMY;  Surgeon: Swaziland, Peter M, MD;  Location: [facility];  Service: Cardiovascular;  Laterality: N/A;   CORONARY IMAGING/OCT N/A 04/03/2022   Procedure: INTRAVASCULAR IMAGING/OCT;  Surgeon: Swaziland, Peter M, MD;  Location: [facility] INVASIVE CV LAB;  Service: Cardiovascular;  Laterality: N/A;   CORONARY PRESSURE/FFR STUDY N/A 01/18/2018   Procedure: INTRAVASCULAR PRESSURE WIRE/FFR STUDY;  Surgeon: Verlin Lonni BIRCH, MD;  Location: [facility];  Service: Cardiovascular;   Laterality: N/A;   CORONARY PRESSURE/FFR STUDY N/A 04/03/2022   Procedure: INTRAVASCULAR PRESSURE WIRE/FFR STUDY;  Surgeon: Swaziland, Peter M, MD;  Location: [facility] INVASIVE CV LAB;  Service: Cardiovascular;  Laterality: N/A;   CORONARY STENT INTERVENTION N/A 04/03/2022   Procedure: CORONARY STENT INTERVENTION;  Surgeon: Swaziland, Peter M, MD;  Location: [facility] INVASIVE CV LAB;  Service: Cardiovascular;  Laterality: N/A;   HERNIA REPAIR     LEFT HEART CATH AND CORONARY ANGIOGRAPHY N/A 01/18/2018   Procedure: LEFT HEART CATH AND CORONARY ANGIOGRAPHY;  Surgeon: Verlin Lonni BIRCH, MD;  Location: [facility];  Service: Cardiovascular;  Laterality: N/A;   LEFT HEART CATH AND CORONARY ANGIOGRAPHY N/A 03/21/2020   Procedure: LEFT HEART CATH AND CORONARY ANGIOGRAPHY;  Surgeon: Darron Deatrice LABOR, MD;  Location: [facility];  Service: Cardiovascular;  Laterality: N/A;   LEFT HEART CATH AND CORONARY ANGIOGRAPHY N/A 04/03/2022   Procedure: LEFT HEART CATH AND CORONARY ANGIOGRAPHY;  Surgeon: Swaziland, Peter M, MD;  Location: [facility] INVASIVE CV LAB;  Service: Cardiovascular;  Laterality: N/A;   TOTAL KNEE ARTHROPLASTY Left 08/11/2023   Procedure: LEFT TOTAL KNEE ARTHROPLASTY;  Surgeon: Vernetta Lonni GRADE, MD;  Location: [facility] OR;  Service: Orthopedics;  Laterality: Left;   TUBAL LIGATION     UMBILICAL HERNIA REPAIR  1983   Patient Active Problem List   Diagnosis Date Noted   Status post total left knee replacement 08/11/2023   Unilateral primary osteoarthritis, left knee 08/10/2023   Hyperlipidemia 08/01/2020   Asthmatic bronchitis, mild persistent, uncomplicated    Unstable angina (HCC) 01/16/2018   Cellulitis of right arm 10/07/2016   Sepsis (HCC) 10/07/2016   Chest pain 10/07/2016   Dyspnea on exertion 05/24/2015   DVT, lower extremity (HCC) 05/09/2014   Right calf pain 06/21/2013   Pulmonary embolism (HCC) 06/21/2013   Dysphagia 06/27/2011   Cough 06/27/2011   CHEST PAIN 01/18/2009   RHINITIS  04/13/2007   Obstructive chronic bronchitis with acute bronchitis (HCC) 04/13/2007   GERD 04/13/2007   Depression 01/21/2007   OSTEOARTHRITIS 01/21/2007   LOW BACK PAIN 01/21/2007   BREAST CANCER, HX OF 01/21/2007    PCP: Onita Rush, MD  REFERRING PROVIDER: Vernetta Lonni GRADE, MD  REFERRING DIAG: [id number] (ICD-10-CM) - Status post total left knee replacement   THERAPY DIAG:  Acute pain of left knee  Difficulty in walking, not elsewhere classified  Localized edema  Muscle weakness (generalized)  Rationale for Evaluation and Treatment: Rehabilitation  ONSET DATE: 08/11/23 TKA  SUBJECTIVE:   SUBJECTIVE STATEMENT:  09/22/2023 Patient reports that she did a lot of walking, and has some soreness in her knee. However her overall pain and mobility seem to be improving.   Eval: Pt reports she is doing well with her knee following surgery. Pt notes her L foot at times feels cold and the top of her L foot will hurt. Following her hospitalization, the pt reports she received therapy at [facility] for approx 1 week.  PERTINENT HISTORY: High BMI, arthritis, spondilolisthesis  PAIN:  Are you having pain? Yes: NPRS scale: 2-10/10 Pain location: Medial L knee Pain description: soreness Aggravating factors: Activity level Relieving factors: pain medication, moving, cold pack  PRECAUTIONS: Knee  RED FLAGS: None   WEIGHT BEARING RESTRICTIONS: No  FALLS:  Has patient fallen in last 6 months? No  LIVING ENVIRONMENT: Lives with: lives alone but daughter is helping Lives in: House/apartment Stairs: Yes: External: 2 steps; none Has following equipment at home: Walker - 2 wheeled, shower chair, and bed side commode  OCCUPATION: Retired  PLOF: Independent  PATIENT GOALS: Good use of my L knee  NEXT MD VISIT: 09/24/23  OBJECTIVE:  Note: Objective measures were completed at Evaluation unless otherwise noted.  DIAGNOSTIC FINDINGS:   PATIENT SURVEYS:  LEFS:  31/80=39% ability  COGNITION: Overall cognitive status: Within functional limits for tasks assessed     SENSATION: WFL  EDEMA:  Present for the L knee  POSTURE: rounded shoulders, forward head, and flexed trunk   PALPATION: TTP of the L peri-knee especially medially  LOWER EXTREMITY ROM:  Active ROM Left eval Left eval Left  09/17/23  Hip flexion     Hip extension     Hip abduction     Hip adduction     Hip internal rotation     Hip external rotation     Knee flexion 90 95 102  Knee extension 20 lacking     Ankle dorsiflexion     Ankle plantarflexion     Ankle inversion     Ankle eversion      (Blank rows = not tested)  LOWER EXTREMITY MMT:  MMT Right eval Left eval  Hip flexion 3   Hip extension    Hip abduction 3   Hip adduction    Hip  internal rotation    Hip external rotation 3   Knee flexion 3   Knee extension 3   Ankle dorsiflexion    Ankle plantarflexion    Ankle inversion    Ankle eversion     (Blank rows = not tested)   FUNCTIONAL TESTS:  5 times sit to stand: TBA 2 minute walk test: TBA  GAIT: Distance walked: 200' Assistive device utilized: Environmental consultant - 2 wheeled Level of assistance: Modified independence Comments: Decreased pace                                                                                                                                TREATMENT DATE:    [facility] Adult PT Treatment:                                                DATE: 09/22/2023  Therapeutic Exercise: Nustep 8 minutes, level 4  LAQ 2# 2x10 Seated h/s curl  green 10 x 2  Supine QS/TKE with small ball 10 x 2, 3 sec hold  SLR with initial QS x 10 Long duration extension stretch in supine, concurrent with cold pack application at end of session.  PROM knee flexion thomas stretch  Supine heel slides, use of slide board and stretching strep, 2 x 10 with 3-5 sec hold   Modalities: Cold Pack applied x 5 minutes with calf/foot on pillow at end of session.  Concurrent with long duration extension stretch   [facility] Adult PT Treatment:                                                DATE: 09/17/23 Therapeutic Exercise: Nustep 5 minutes  LAQ 2# 10 x 2  Seated h/s curl  red 10 x 2  Supine QS/TKE 10 x 2  SLR with initial QS x 10 Passive knee flexion Passive knee ext Passive hamstring stretch  Manual Therapy: Seated knee flexion mobs, supine knee ext mobs Therapeutic Activity: 2 MWT 5 x STS STS 10 x 2  Supine heel slides, use of slide board and stretching strep, 2 x 10 with 3-5 sec hold  Modalities: Vasopneumatic Compression  10 minutes, low compression, 38 deg Leg elevated on wedge x 10   [facility] Adult PT Treatment:                                                DATE: 09/15/2023  Therapeutic Exercise: NuStep x 5 minutes, level 3 for AAROM  Supine heel slides, use of slide board and stretching  strep, 2 x 10 with 3-5 sec hold  Supine quad sets with towel behind knee, 2 x 10, 3-5 sec hold  LAQ 3 sec, 2 x 10  STS 2 x 10 PROM knee flexion x 20       PATIENT EDUCATION:  Education details: Eval findings, POC, HEP, self care  Person educated: Patient Education method: Explanation, Demonstration, Tactile cues, Verbal cues, and Handouts Education comprehension: verbalized understanding, returned demonstration, verbal cues required, and tactile cues required  HOME EXERCISE PROGRAM: Access Code: [email or web address] URL: [email or web address] Date: 09/02/2023 Prepared by: Dasie Daft  Exercises - Supine Quad Set  - 2 x daily - 7 x weekly - 1 sets - 10 reps - 5 hold - Active Straight Leg Raise with Quad Set  - 2 x daily - 7 x weekly - 2 sets - 10 reps - 3 hold - Supine Heel Slide  - 2 x daily - 7 x weekly - 1 sets - 10 reps - 5 hold - Supine Heel Slide with Strap  - 2 x daily - 7 x weekly - 1 sets - 5 reps - 20 hold - Seated Table Hamstring Stretch  - 2 x daily - 7 x weekly - 1 sets - 5 reps - 30 hold - Seated Long Arc Quad  - 2 x daily - 7  x weekly - 1 sets - 10 reps - 5 hold  ASSESSMENT:  CLINICAL IMPRESSION: 09/22/2023 Chellie had good tolerance of today's treatment session, which focused on knee flexion and extension ROM and strengthening activities. Patient continues to have extension lag, which she is encouraged to work on at home. We will continue to progress per POC as tolerated, in order to reach established rehab goals.      OBJECTIVE IMPAIRMENTS: decreased activity tolerance, difficulty walking, decreased ROM, decreased strength, increased edema, obesity, and pain.   ACTIVITY LIMITATIONS: carrying, lifting, bending, sitting, standing, squatting, sleeping, stairs, transfers, bed mobility, bathing, dressing, hygiene/grooming, locomotion level, and caring for others  PARTICIPATION LIMITATIONS: meal prep, cleaning, laundry, driving, shopping, and community activity  PERSONAL FACTORS: Fitness, Past/current experiences, Time since onset of injury/illness/exacerbation, and 1 comorbidity: high BMI are also affecting patient's functional outcome.   REHAB POTENTIAL: Good  CLINICAL DECISION MAKING: Evolving/moderate complexity  EVALUATION COMPLEXITY: Moderate   GOALS:  SHORT TERM GOALS: Target date: 09/18/23 Pt will be Ind in an initial HEP  Baseline:started Goal status: INITIAL  2.  Pt will demonstrate AROM for the L knee for 10-100d to progress toward functional ROM  Baseline: 20-90 Goal status: INITIAL  LONG TERM GOALS: Target date: 11/06/23  Pt will be Ind in a final HEP to maintain achieved LOF  Baseline:  Goal status: INITIAL  2.  Pt will demonstrate AROM for the L knee for 0-115d for appropriate functional use with sitting and negotiating steps Baseline: 20-90 Goal status: INITIAL  3.  Pt will demonstrate L knee and hip strength of 4/5 or greater for appropriate functional mobility Baseline: 3/5 Goal status: INITIAL  4.  Improve 5xSTS by MCID of 5 and by MCID of 71ft as indication of improved  functional mobility  Baseline: TBA when pt is able to walk without the assistance of a [facility] 09/17/23: 390 feet without AD, 5 x STS: 15.2 sec without UE Goal status: ONGOING  5.  Pt will be able to walk Indly 540ft and negotiate 12 steps for community ambulation Baseline:  Goal status: INITIAL  6.  Pt's LEFS score will improve by the  MCID to 59% % as indication of improved function  Baseline: 39% Goal status: INITIAL   PLAN:  PT FREQUENCY: 2x/week  PT DURATION: 8 weeks  PLANNED INTERVENTIONS: 97164- PT Re-evaluation, 97110-Therapeutic exercises, 97530- Therapeutic activity, [id number]- Neuromuscular re-education, 97535- Self Care, [id number]- Manual therapy, [id number]- Gait training, [phone]- Electrical stimulation (unattended), 97016- Vasopneumatic device, 20560 (1-2 muscles), 20561 (3+ muscles)- Dry Needling, Patient/Family education, Balance training, Stair training, Taping, Joint mobilization, Cryotherapy, and Moist heat  PLAN FOR NEXT SESSION:  assess response to HEP; progress therex as indicated; use of modalities, manual therapy; and TPDN as indicated.   [name], PT, DPT  09/22/2023 6:58 PM

## 2023-09-24 ENCOUNTER — Ambulatory Visit: Admitting: Physician Assistant

## 2023-09-24 ENCOUNTER — Encounter: Payer: Self-pay | Admitting: Physician Assistant

## 2023-09-24 DIAGNOSIS — Z96652 Presence of left artificial knee joint: Secondary | ICD-10-CM

## 2023-09-24 MED ORDER — METHOCARBAMOL 500 MG PO TABS: 500.0000 mg | ORAL_TABLET | Freq: Four times a day (QID) | ORAL | 1 refills | Status: AC | PRN

## 2023-09-24 NOTE — Progress Notes (Signed)
 HPI: Mrs. Diane Barker returns today for 6 weeks 2 days status post left total knee arthroplasty.  She is overall doing well has some soreness in the knee takes Tylenol  arthritis only for pain.  She is on chronic Xarelto  due to history of DVT.  She is working with the physical therapy on range of motion and strengthening.  Physical exam: General Well-developed well-nourished female who ambulates without any assistive device.  Slight antalgic gait. Left knee full extension flexion to approximately 105 degrees.  No instability valgus varus stressing.  Calf supple nontender.  Surgical incisions healing well no signs of infection or wound dehiscence.  Impression: Status post left total knee arthroplasty 08/11/2023  Plan: She will continue to work on range of motion and strengthening.  Follow-up with Dr. Vernetta in 4 weeks sooner if there is any questions concerns.  Continue to work on scar tissue mobilization.

## 2023-09-24 NOTE — Addendum Note (Signed)
 Addended by: GRETTA FORTE on: 09/24/2023 03:18 PM   Modules accepted: Orders

## 2023-09-25 ENCOUNTER — Ambulatory Visit

## 2023-09-25 DIAGNOSIS — M25562 Pain in left knee: Secondary | ICD-10-CM

## 2023-09-25 DIAGNOSIS — M6281 Muscle weakness (generalized): Secondary | ICD-10-CM

## 2023-09-25 DIAGNOSIS — R600 Localized edema: Secondary | ICD-10-CM

## 2023-09-25 DIAGNOSIS — R262 Difficulty in walking, not elsewhere classified: Secondary | ICD-10-CM

## 2023-09-25 NOTE — Therapy (Signed)
 OUTPATIENT PHYSICAL THERAPY NOTE   Patient Name: Diane Barker MRN: [id number] DOB:[birth year], 73 y.o., female Today's Date: 09/26/2023  END OF SESSION:   PT End of Session - 09/25/23 1307     Visit Number 7    Number of Visits 17    Date for PT Re-Evaluation 11/06/23    Authorization Type [employer] DUAL COMPLETE    PT Start Time 1300    PT Stop Time 1340    PT Time Calculation (min) 40 min    Activity Tolerance Patient tolerated treatment well    Behavior During Therapy WFL for tasks assessed/performed           Past Medical History:  Diagnosis Date   Anxiety    Arthritis    all over my body (01/15/2018)   Breast cancer, right breast (HCC) 2000   Rt lumpectomy, Chemo/XRT/rt axillary node    Bronchial asthma    Chronic lower back pain    Depression    Diverticulosis    DVT (deep venous thrombosis) (HCC) 06/2013   RLE   GERD (gastroesophageal reflux disease)    High cholesterol    Hx of adenomatous colonic polyps    Hypertension    Obesity    Osteoarthritis    Personal history of chemotherapy    Personal history of radiation therapy    Pulmonary embolism (HCC) 06/2013   both lungs   Spondylolisthesis    Past Surgical History:  Procedure Laterality Date   BREAST BIOPSY Right 2000   BREAST LUMPECTOMY Right 2000   for CA txed w/ chemo and radiation, right   CARDIAC CATHETERIZATION  03/21/2020   CORONARY ATHERECTOMY N/A 04/03/2022   Procedure: CORONARY ATHERECTOMY;  Surgeon: Swaziland, Peter M, MD;  Location: [facility] INVASIVE CV [facility];  Service: Cardiovascular;  Laterality: N/A;   CORONARY IMAGING/OCT N/A 04/03/2022   Procedure: INTRAVASCULAR IMAGING/OCT;  Surgeon: Swaziland, Peter M, MD;  Location: [facility] INVASIVE CV LAB;  Service: Cardiovascular;  Laterality: N/A;   CORONARY PRESSURE/FFR STUDY N/A 01/18/2018   Procedure: INTRAVASCULAR PRESSURE WIRE/FFR STUDY;  Surgeon: Verlin Lonni BIRCH, MD;  Location: [facility] CV [facility];  Service: Cardiovascular;  Laterality:  N/A;   CORONARY PRESSURE/FFR STUDY N/A 04/03/2022   Procedure: INTRAVASCULAR PRESSURE WIRE/FFR STUDY;  Surgeon: Swaziland, Peter M, MD;  Location: [facility] INVASIVE CV LAB;  Service: Cardiovascular;  Laterality: N/A;   CORONARY STENT INTERVENTION N/A 04/03/2022   Procedure: CORONARY STENT INTERVENTION;  Surgeon: Swaziland, Peter M, MD;  Location: [facility] INVASIVE CV LAB;  Service: Cardiovascular;  Laterality: N/A;   HERNIA REPAIR     LEFT HEART CATH AND CORONARY ANGIOGRAPHY N/A 01/18/2018   Procedure: LEFT HEART CATH AND CORONARY ANGIOGRAPHY;  Surgeon: Verlin Lonni BIRCH, MD;  Location: [facility] IN[facility];  Service: Cardiovascular;  Laterality: N/A;   LEFT HEART CATH AND CORONARY ANGIOGRAPHY N/A 03/21/2020   Procedure: LEFT HEART CATH AND CORONARY ANGIOGRAPHY;  Surgeon: Darron Deatrice LABOR, MD;  Location: [facility] INV[facility] CV [facility];  Service: Cardiovascular;  Laterality: N/A;   LEFT HEART CATH AND CORONARY ANGIOGRAPHY N/A 04/03/2022   Procedure: LEFT HEART CATH AND CORONARY ANGIOGRAPHY;  Surgeon: Swaziland, Peter M, MD;  Location: [facility] INVASIVE CV LAB;  Service: Cardiovascular;  Laterality: N/A;   TOTAL KNEE ARTHROPLASTY Left 08/11/2023   Procedure: LEFT TOTAL KNEE ARTHROPLASTY;  Surgeon: Vernetta Lonni GRADE, MD;  Location: [facility] OR;  Service: Orthopedics;  Laterality: Left;   TUBAL LIGATION     UMBILICAL HERNIA REPAIR  1983   Patient Active Problem  List   Diagnosis Date Noted   Status post total left knee replacement 08/11/2023   Unilateral primary osteoarthritis, left knee 08/10/2023   Hyperlipidemia 08/01/2020   Asthmatic bronchitis, mild persistent, uncomplicated    Unstable angina (HCC) 01/16/2018   Cellulitis of right arm 10/07/2016   Sepsis (HCC) 10/07/2016   Chest pain 10/07/2016   Dyspnea on exertion 05/24/2015   DVT, lower extremity (HCC) 05/09/2014   Right calf pain 06/21/2013   Pulmonary embolism (HCC) 06/21/2013   Dysphagia 06/27/2011   Cough 06/27/2011   CHEST PAIN 01/18/2009   RHINITIS 04/13/2007    Obstructive chronic bronchitis with acute bronchitis (HCC) 04/13/2007   GERD 04/13/2007   Depression 01/21/2007   OSTEOARTHRITIS 01/21/2007   LOW BACK PAIN 01/21/2007   BREAST CANCER, HX OF 01/21/2007    PCP: Onita Rush, MD  REFERRING PROVIDER: Vernetta Lonni GRADE, MD  REFERRING DIAG: [phone] (ICD-10-CM) - Status post total left knee replacement   THERAPY DIAG:  Acute pain of left knee  Difficulty in walking, not elsewhere classified  Localized edema  Muscle weakness (generalized)  Rationale for Evaluation and Treatment: Rehabilitation  ONSET DATE: 08/11/23 TKA  SUBJECTIVE:   SUBJECTIVE STATEMENT:  09/26/2023 Patient reports that she did a lot of walking, and has some soreness in her knee. However her overall pain and mobility seem to be improving.   Eval: Pt reports she is doing well with her knee following surgery. Pt notes her L foot at times feels cold and the top of her L foot will hurt. Following her hospitalization, the pt reports she received therapy at [facility] for approx 1 week.  PERTINENT HISTORY: High BMI, arthritis, spondilolisthesis  PAIN:  Are you having pain? Yes: NPRS scale: 2-10/10 Pain location: Medial L knee Pain description: soreness Aggravating factors: Activity level Relieving factors: pain medication, moving, cold pack  PRECAUTIONS: Knee  RED FLAGS: None   WEIGHT BEARING RESTRICTIONS: No  FALLS:  Has patient fallen in last 6 months? No  LIVING ENVIRONMENT: Lives with: lives alone but daughter is helping Lives in: House/apartment Stairs: Yes: External: 2 steps; none Has following equipment at home: Walker - 2 wheeled, shower chair, and bed side commode  OCCUPATION: Retired  PLOF: Independent  PATIENT GOALS: Good use of my L knee  NEXT MD VISIT: 09/24/23  OBJECTIVE:  Note: Objective measures were completed at Evaluation unless otherwise noted.  DIAGNOSTIC FINDINGS:   PATIENT SURVEYS:  LEFS: 31/80=39%  ability  COGNITION: Overall cognitive status: Within functional limits for tasks assessed     SENSATION: WFL  EDEMA:  Present for the L knee  POSTURE: rounded shoulders, forward head, and flexed trunk   PALPATION: TTP of the L peri-knee especially medially  LOWER EXTREMITY ROM:  Active ROM Left eval Left eval Left  09/17/23  Hip flexion     Hip extension     Hip abduction     Hip adduction     Hip internal rotation     Hip external rotation     Knee flexion 90 95 102  Knee extension 20 lacking     Ankle dorsiflexion     Ankle plantarflexion     Ankle inversion     Ankle eversion      (Blank rows = not tested)  LOWER EXTREMITY MMT:  MMT Right eval Left eval  Hip flexion 3   Hip extension    Hip abduction 3   Hip adduction    Hip internal rotation    Hip  external rotation 3   Knee flexion 3   Knee extension 3   Ankle dorsiflexion    Ankle plantarflexion    Ankle inversion    Ankle eversion     (Blank rows = not tested)   FUNCTIONAL TESTS:  5 times sit to stand: TBA 2 minute walk test: TBA  GAIT: Distance walked: 200' Assistive device utilized: Environmental consultant - 2 wheeled Level of assistance: Modified independence Comments: Decreased pace                                                                                                                                TREATMENT DATE:   [facility] Adult PT Treatment:                                                DATE: 09/25/2023  Therapeutic Exercise: Nustep 8 minutes, level 2, seat 6 to focus on knee flexion AAROM Seated heel slides x 10 Seated knee extension, 3 sec hold x 10  Seated heel slide + LAQ, x 10  LAQ 2# 2x10 Seated h/s curl green TB 10 x 2  Long duration extension stretch in supine, concurrent with vasopneumatic compression at end of session.  PROM knee flexion thomas stretch   Modalities: Vasopneumatic Compression  10 minutes, low compression, 38 deg Leg extended  [facility] Adult PT Treatment:                                                 DATE: 09/22/2023  Therapeutic Exercise: Nustep 8 minutes, level 4  LAQ 2# 2x10 Seated h/s curl  green 10 x 2  Supine QS/TKE with small ball 10 x 2, 3 sec hold  SLR with initial QS x 10 Long duration extension stretch in supine, concurrent with cold pack application at end of session.  PROM knee flexion thomas stretch  Supine heel slides, use of slide board and stretching strep, 2 x 10 with 3-5 sec hold   Modalities: Cold Pack applied x 5 minutes with calf/foot on pillow at end of session. Concurrent with long duration extension stretch   [facility] Adult PT Treatment:                                                DATE: 09/17/23 Therapeutic Exercise: Nustep 5 minutes  LAQ 2# 10 x 2  Seated h/s curl  red 10 x 2  Supine QS/TKE 10 x 2  SLR with initial QS x 10 Passive knee flexion Passive knee ext Passive hamstring stretch  Manual  Therapy: Seated knee flexion mobs, supine knee ext mobs Therapeutic Activity: 2 MWT 5 x STS STS 10 x 2  Supine heel slides, use of slide board and stretching strep, 2 x 10 with 3-5 sec hold  Modalities: Vasopneumatic Compression  10 minutes, low compression, 38 deg Leg elevated on wedge x 10       PATIENT EDUCATION:  Education details: Eval findings, POC, HEP, self care  Person educated: Patient Education method: Explanation, Demonstration, Tactile cues, Verbal cues, and Handouts Education comprehension: verbalized understanding, returned demonstration, verbal cues required, and tactile cues required  HOME EXERCISE PROGRAM: Access Code: [email or web address] URL: [email or web address] Date: 09/02/2023 Prepared by: Dasie Daft  Exercises - Supine Quad Set  - 2 x daily - 7 x weekly - 1 sets - 10 reps - 5 hold - Active Straight Leg Raise with Quad Set  - 2 x daily - 7 x weekly - 2 sets - 10 reps - 3 hold - Supine Heel Slide  - 2 x daily - 7 x weekly - 1 sets - 10 reps - 5 hold - Supine Heel Slide with Strap   - 2 x daily - 7 x weekly - 1 sets - 5 reps - 20 hold - Seated Table Hamstring Stretch  - 2 x daily - 7 x weekly - 1 sets - 5 reps - 30 hold - Seated Long Arc Quad  - 2 x daily - 7 x weekly - 1 sets - 10 reps - 5 hold  ASSESSMENT:  CLINICAL IMPRESSION: 09/26/2023 Kavya had good tolerance of today's treatment session, which focused on seated knee ROM and strengthening activities. Patient continues to have extension lag, and is encouraged to continue with HEP. We will continue to progress per POC as tolerated, in order to reach established rehab goals.      OBJECTIVE IMPAIRMENTS: decreased activity tolerance, difficulty walking, decreased ROM, decreased strength, increased edema, obesity, and pain.   ACTIVITY LIMITATIONS: carrying, lifting, bending, sitting, standing, squatting, sleeping, stairs, transfers, bed mobility, bathing, dressing, hygiene/grooming, locomotion level, and caring for others  PARTICIPATION LIMITATIONS: meal prep, cleaning, laundry, driving, shopping, and community activity  PERSONAL FACTORS: Fitness, Past/current experiences, Time since onset of injury/illness/exacerbation, and 1 comorbidity: high BMI are also affecting patient's functional outcome.   REHAB POTENTIAL: Good  CLINICAL DECISION MAKING: Evolving/moderate complexity  EVALUATION COMPLEXITY: Moderate   GOALS:  SHORT TERM GOALS: Target date: 09/18/23 Pt will be Ind in an initial HEP  Baseline:started Goal status: INITIAL  2.  Pt will demonstrate AROM for the L knee for 10-100d to progress toward functional ROM  Baseline: 20-90 Goal status: INITIAL  LONG TERM GOALS: Target date: 11/06/23  Pt will be Ind in a final HEP to maintain achieved LOF  Baseline:  Goal status: INITIAL  2.  Pt will demonstrate AROM for the L knee for 0-115d for appropriate functional use with sitting and negotiating steps Baseline: 20-90 Goal status: INITIAL  3.  Pt will demonstrate L knee and hip strength of 4/5 or greater  for appropriate functional mobility Baseline: 3/5 Goal status: INITIAL  4.  Improve 5xSTS by MCID of 5 and by MCID of 51ft as indication of improved functional mobility  Baseline: TBA when pt is able to walk without the assistance of a [facility] 09/17/23: 390 feet without AD, 5 x STS: 15.2 sec without UE Goal status: ONGOING  5.  Pt will be able to walk Indly 550ft and negotiate 12 steps for community ambulation Baseline:  Goal  status: INITIAL  6.  Pt's LEFS score will improve by the MCID to 59% % as indication of improved function  Baseline: 39% Goal status: INITIAL   PLAN:  PT FREQUENCY: 2x/week  PT DURATION: 8 weeks  PLANNED INTERVENTIONS: 97164- PT Re-evaluation, 97110-Therapeutic exercises, 97530- Therapeutic activity, [id number]- Neuromuscular re-education, 97535- Self Care, [id number]- Manual therapy, [id number]- Gait training, [phone]- Electrical stimulation (unattended), 97016- Vasopneumatic device, 20560 (1-2 muscles), 20561 (3+ muscles)- Dry Needling, Patient/Family education, Balance training, Stair training, Taping, Joint mobilization, Cryotherapy, and Moist heat  PLAN FOR NEXT SESSION:  assess response to HEP; progress therex as indicated; use of modalities, manual therapy; and TPDN as indicated.   [name], PT, DPT  09/26/2023 12:29 PM

## 2023-09-30 ENCOUNTER — Ambulatory Visit

## 2023-10-01 ENCOUNTER — Ambulatory Visit: Admitting: Physical Therapy

## 2023-10-01 ENCOUNTER — Encounter: Payer: Self-pay | Admitting: Physical Therapy

## 2023-10-01 DIAGNOSIS — R262 Difficulty in walking, not elsewhere classified: Secondary | ICD-10-CM

## 2023-10-01 DIAGNOSIS — M25562 Pain in left knee: Secondary | ICD-10-CM | POA: Diagnosis not present

## 2023-10-01 NOTE — Therapy (Signed)
 OUTPATIENT PHYSICAL THERAPY NOTE   Patient Name: Diane Barker MRN: [id number] DOB:09/01/1950, 73 y.o., female Today's Date: 10/01/2023  END OF SESSION:   PT End of Session - 10/01/23 1447     Visit Number 8    Number of Visits 17    Date for PT Re-Evaluation 11/06/23    Authorization Type [employer] DUAL COMPLETE    PT Start Time 0245    PT Stop Time 0325    PT Time Calculation (min) 40 min           Past Medical History:  Diagnosis Date   Anxiety    Arthritis    all over my body (01/15/2018)   Breast cancer, right breast (HCC) 2000   Rt lumpectomy, Chemo/XRT/rt axillary node    Bronchial asthma    Chronic lower back pain    Depression    Diverticulosis    DVT (deep venous thrombosis) (HCC) 06/2013   RLE   GERD (gastroesophageal reflux disease)    High cholesterol    Hx of adenomatous colonic polyps    Hypertension    Obesity    Osteoarthritis    Personal history of chemotherapy    Personal history of radiation therapy    Pulmonary embolism (HCC) 06/2013   both lungs   Spondylolisthesis    Past Surgical History:  Procedure Laterality Date   BREAST BIOPSY Right 2000   BREAST LUMPECTOMY Right 2000   for CA txed w/ chemo and radiation, right   CARDIAC CATHETERIZATION  03/21/2020   CORONARY ATHERECTOMY N/A 04/03/2022   Procedure: CORONARY ATHERECTOMY;  Surgeon: Swaziland, Peter M, MD;  Location: [facility]IVE [facility];  Service: Cardiovascular;  Laterality: N/A;   CORONARY IMAGING/OCT N/A 04/03/2022   Procedure: INTRAVASCULAR IMAGING/OCT;  Surgeon: Swaziland, Peter M, MD;  Location: [facility] INVASIVE CV LAB;  Service: Cardiovascular;  Laterality: N/A;   CORONARY PRESSURE/FFR STUDY N/A 01/18/2018   Procedure: INTRAVASCULAR PRESSURE WIRE/FFR STUDY;  Surgeon: Verlin Lonni BIRCH, MD;  Location: [facility];  Service: Cardiovascular;  Laterality: N/A;   CORONARY PRESSURE/FFR STUDY N/A 04/03/2022   Procedure: INTRAVASCULAR PRESSURE WIRE/FFR STUDY;  Surgeon: Swaziland,  Peter M, MD;  Location: [facility] INVASIVE CV LAB;  Service: Cardiovascular;  Laterality: N/A;   CORONARY STENT INTERVENTION N/A 04/03/2022   Procedure: CORONARY STENT INTERVENTION;  Surgeon: Swaziland, Peter M, MD;  Location: [facility] INVASIVE CV LAB;  Service: Cardiovascular;  Laterality: N/A;   HERNIA REPAIR     LEFT HEART CATH AND CORONARY ANGIOGRAPHY N/A 01/18/2018   Procedure: LEFT HEART CATH AND CORONARY ANGIOGRAPHY;  Surgeon: Verlin Lonni BIRCH, MD;  Location: [facility];  Service: Cardiovascular;  Laterality: N/A;   LEFT HEART CATH AND CORONARY ANGIOGRAPHY N/A 03/21/2020   Procedure: LEFT HEART CATH AND CORONARY ANGIOGRAPHY;  Surgeon: Darron Deatrice LABOR, MD;  Location: [facility] CV [facility];  Service: Cardiovascular;  Laterality: N/A;   LEFT HEART CATH AND CORONARY ANGIOGRAPHY N/A 04/03/2022   Procedure: LEFT HEART CATH AND CORONARY ANGIOGRAPHY;  Surgeon: Swaziland, Peter M, MD;  Location: [facility] INVASIVE CV LAB;  Service: Cardiovascular;  Laterality: N/A;   TOTAL KNEE ARTHROPLASTY Left 08/11/2023   Procedure: LEFT TOTAL KNEE ARTHROPLASTY;  Surgeon: Vernetta Lonni GRADE, MD;  Location: [facility] OR;  Service: Orthopedics;  Laterality: Left;   TUBAL LIGATION     UMBILICAL HERNIA REPAIR  1983   Patient Active Problem List   Diagnosis Date Noted   Status post total left knee replacement 08/11/2023   Unilateral primary  osteoarthritis, left knee 08/10/2023   Hyperlipidemia 08/01/2020   Asthmatic bronchitis, mild persistent, uncomplicated    Unstable angina (HCC) 01/16/2018   Cellulitis of right arm 10/07/2016   Sepsis (HCC) 10/07/2016   Chest pain 10/07/2016   Dyspnea on exertion 05/24/2015   DVT, lower extremity (HCC) 05/09/2014   Right calf pain 06/21/2013   Pulmonary embolism (HCC) 06/21/2013   Dysphagia 06/27/2011   Cough 06/27/2011   CHEST PAIN 01/18/2009   RHINITIS 04/13/2007   Obstructive chronic bronchitis with acute bronchitis (HCC) 04/13/2007   GERD 04/13/2007   Depression 01/21/2007    OSTEOARTHRITIS 01/21/2007   LOW BACK PAIN 01/21/2007   BREAST CANCER, HX OF 01/21/2007    PCP: Onita Rush, MD  REFERRING PROVIDER: Vernetta Lonni GRADE, MD  REFERRING DIAG: [phone] (ICD-10-CM) - Status post total left knee replacement   THERAPY DIAG:  Acute pain of left knee  Difficulty in walking, not elsewhere classified  Rationale for Evaluation and Treatment: Rehabilitation  ONSET DATE: 08/11/23 TKA  SUBJECTIVE:   SUBJECTIVE STATEMENT:  10/01/2023 No pain on arrival.   Eval: Pt reports she is doing well with her knee following surgery. Pt notes her L foot at times feels cold and the top of her L foot will hurt. Following her hospitalization, the pt reports she received therapy at [facility] for approx 1 week.  PERTINENT HISTORY: High BMI, arthritis, spondilolisthesis  PAIN:  Are you having pain? Yes: NPRS scale: 0/10 Pain location: Medial L knee Pain description: soreness Aggravating factors: Activity level Relieving factors: pain medication, moving, cold pack  PRECAUTIONS: Knee  RED FLAGS: None   WEIGHT BEARING RESTRICTIONS: No  FALLS:  Has patient fallen in last 6 months? No  LIVING ENVIRONMENT: Lives with: lives alone but daughter is helping Lives in: House/apartment Stairs: Yes: External: 2 steps; none Has following equipment at home: Walker - 2 wheeled, shower chair, and bed side commode  OCCUPATION: Retired  PLOF: Independent  PATIENT GOALS: Good use of my L knee  NEXT MD VISIT: 09/24/23  OBJECTIVE:  Note: Objective measures were completed at Evaluation unless otherwise noted.  DIAGNOSTIC FINDINGS:   PATIENT SURVEYS:  LEFS: 31/80=39% ability  COGNITION: Overall cognitive status: Within functional limits for tasks assessed     SENSATION: WFL  EDEMA:  Present for the L knee  POSTURE: rounded shoulders, forward head, and flexed trunk   PALPATION: TTP of the L peri-knee especially medially  LOWER EXTREMITY ROM:  Active  ROM Left eval Left eval Left  09/17/23  Hip flexion     Hip extension     Hip abduction     Hip adduction     Hip internal rotation     Hip external rotation     Knee flexion 90 95 102  Knee extension 20 lacking     Ankle dorsiflexion     Ankle plantarflexion     Ankle inversion     Ankle eversion      (Blank rows = not tested)  LOWER EXTREMITY MMT:  MMT Right eval Left eval  Hip flexion 3   Hip extension    Hip abduction 3   Hip adduction    Hip internal rotation    Hip external rotation 3   Knee flexion 3   Knee extension 3   Ankle dorsiflexion    Ankle plantarflexion    Ankle inversion    Ankle eversion     (Blank rows = not tested)   FUNCTIONAL TESTS:  5 times  sit to stand: TBA 2 minute walk test: TBA  GAIT: Distance walked: 200' Assistive device utilized: Environmental consultant - 2 wheeled Level of assistance: Modified independence Comments: Decreased pace                                                                                                                                TREATMENT DATE:  [facility] Adult PT Treatment:                                                DATE: 10/01/23 Therapeutic Exercise: Nustep L 6 LE only x 3 min, moved seat up to 6 height and reduced tension for 3 minutes (L3)  Hamstring stretch with strap  Seated knee flexion PROM Supine PROM knee ext  Hip flexor stretch passive EOM  QS 5 sec x 15  SLR x 15 STS x 10  Sink squats  6 inch step ups  x 10  Manual Therapy: Knee mobs seated for knee flexion, Supine for knee extension      [facility] Adult PT Treatment:                                                DATE: 09/25/2023  Therapeutic Exercise: Nustep 8 minutes, level 2, seat 6 to focus on knee flexion AAROM Seated heel slides x 10 Seated knee extension, 3 sec hold x 10  Seated heel slide + LAQ, x 10  LAQ 2# 2x10 Seated h/s curl green TB 10 x 2  Long duration extension stretch in supine, concurrent with vasopneumatic compression at end  of session.  PROM knee flexion thomas stretch   Modalities: Vasopneumatic Compression  10 minutes, low compression, 38 deg Leg extended  [facility] Adult PT Treatment:                                                DATE: 09/22/2023  Therapeutic Exercise: Nustep 8 minutes, level 4  LAQ 2# 2x10 Seated h/s curl  green 10 x 2  Supine QS/TKE with small ball 10 x 2, 3 sec hold  SLR with initial QS x 10 Long duration extension stretch in supine, concurrent with cold pack application at end of session.  PROM knee flexion thomas stretch  Supine heel slides, use of slide board and stretching strep, 2 x 10 with 3-5 sec hold   Modalities: Cold Pack applied x 5 minutes with calf/foot on pillow at end of session. Concurrent with long duration extension stretch   [facility] Adult PT Treatment:  DATE: 09/17/23 Therapeutic Exercise: Nustep 5 minutes  LAQ 2# 10 x 2  Seated h/s curl  red 10 x 2  Supine QS/TKE 10 x 2  SLR with initial QS x 10 Passive knee flexion Passive knee ext Passive hamstring stretch  Manual Therapy: Seated knee flexion mobs, supine knee ext mobs Therapeutic Activity: 2 MWT 5 x STS STS 10 x 2  Supine heel slides, use of slide board and stretching strep, 2 x 10 with 3-5 sec hold  Modalities: Vasopneumatic Compression  10 minutes, low compression, 38 deg Leg elevated on wedge x 10       PATIENT EDUCATION:  Education details: Eval findings, POC, HEP, self care  Person educated: Patient Education method: Explanation, Demonstration, Tactile cues, Verbal cues, and Handouts Education comprehension: verbalized understanding, returned demonstration, verbal cues required, and tactile cues required  HOME EXERCISE PROGRAM: Access Code: [email or web address] URL: [email or web address] Date: 09/02/2023 Prepared by: Dasie Daft  Exercises - Supine Quad Set  - 2 x daily - 7 x weekly - 1 sets - 10 reps - 5 hold - Active Straight Leg Raise  with Quad Set  - 2 x daily - 7 x weekly - 2 sets - 10 reps - 3 hold - Supine Heel Slide  - 2 x daily - 7 x weekly - 1 sets - 10 reps - 5 hold - Supine Heel Slide with Strap  - 2 x daily - 7 x weekly - 1 sets - 5 reps - 20 hold - Seated Table Hamstring Stretch  - 2 x daily - 7 x weekly - 1 sets - 5 reps - 30 hold - Seated Long Arc Quad  - 2 x daily - 7 x weekly - 1 sets - 10 reps - 5 hold  ASSESSMENT:  CLINICAL IMPRESSION: 10/01/2023 Diane Barker had good tolerance of today's treatment session, which focused on seated knee ROM and strengthening activities. Patient continues to lack full extension, did achieve 110 PROM for knee flexion. Worked on functional strength with STS, Supported squats and step up, all tolerated well.  We will continue to progress per POC as tolerated, in order to reach established rehab goals.      OBJECTIVE IMPAIRMENTS: decreased activity tolerance, difficulty walking, decreased ROM, decreased strength, increased edema, obesity, and pain.   ACTIVITY LIMITATIONS: carrying, lifting, bending, sitting, standing, squatting, sleeping, stairs, transfers, bed mobility, bathing, dressing, hygiene/grooming, locomotion level, and caring for others  PARTICIPATION LIMITATIONS: meal prep, cleaning, laundry, driving, shopping, and community activity  PERSONAL FACTORS: Fitness, Past/current experiences, Time since onset of injury/illness/exacerbation, and 1 comorbidity: high BMI are also affecting patient's functional outcome.   REHAB POTENTIAL: Good  CLINICAL DECISION MAKING: Evolving/moderate complexity  EVALUATION COMPLEXITY: Moderate   GOALS:  SHORT TERM GOALS: Target date: 09/18/23 Pt will be Ind in an initial HEP  Baseline:started Goal status: MET   2.  Pt will demonstrate AROM for the L knee for 10-100d to progress toward functional ROM  Baseline: 20-90 Goal status: MET  LONG TERM GOALS: Target date: 11/06/23  Pt will be Ind in a final HEP to maintain achieved LOF   Baseline:  Goal status: INITIAL  2.  Pt will demonstrate AROM for the L knee for 0-115d for appropriate functional use with sitting and negotiating steps Baseline: 20-90 Goal status: INITIAL  3.  Pt will demonstrate L knee and hip strength of 4/5 or greater for appropriate functional mobility Baseline: 3/5 Goal status: INITIAL  4.  Improve 5xSTS by MCID of 5 and  by MCID of 22ft as indication of improved functional mobility  Baseline: TBA when pt is able to walk without the assistance of a [facility] 09/17/23: 390 feet without AD, 5 x STS: 15.2 sec without UE Goal status: ONGOING  5.  Pt will be able to walk Indly 570ft and negotiate 12 steps for community ambulation Baseline:  Goal status: INITIAL  6.  Pt's LEFS score will improve by the MCID to 59% % as indication of improved function  Baseline: 39% Goal status: INITIAL   PLAN:  PT FREQUENCY: 2x/week  PT DURATION: 8 weeks  PLANNED INTERVENTIONS: 97164- PT Re-evaluation, 97110-Therapeutic exercises, 97530- Therapeutic activity, [id number]- Neuromuscular re-education, 97535- Self Care, [id number]- Manual therapy, [id number]- Gait training, [phone]- Electrical stimulation (unattended), 97016- Vasopneumatic device, 20560 (1-2 muscles), 20561 (3+ muscles)- Dry Needling, Patient/Family education, Balance training, Stair training, Taping, Joint mobilization, Cryotherapy, and Moist heat  PLAN FOR NEXT SESSION:  assess response to HEP; progress therex as indicated; use of modalities, manual therapy; and TPDN as indicated.   Harlene Persons, PTA 10/01/23 3:29 PM Phone: [phone] Fax: [phone]

## 2023-10-06 ENCOUNTER — Ambulatory Visit: Admitting: Physical Therapy

## 2023-10-08 ENCOUNTER — Ambulatory Visit

## 2023-10-13 ENCOUNTER — Ambulatory Visit

## 2023-10-13 DIAGNOSIS — M25662 Stiffness of left knee, not elsewhere classified: Secondary | ICD-10-CM

## 2023-10-13 DIAGNOSIS — R262 Difficulty in walking, not elsewhere classified: Secondary | ICD-10-CM

## 2023-10-13 DIAGNOSIS — M6281 Muscle weakness (generalized): Secondary | ICD-10-CM

## 2023-10-13 DIAGNOSIS — M25562 Pain in left knee: Secondary | ICD-10-CM

## 2023-10-13 DIAGNOSIS — R600 Localized edema: Secondary | ICD-10-CM

## 2023-10-13 NOTE — Therapy (Signed)
 OUTPATIENT PHYSICAL THERAPY NOTE   Patient Name: Diane Barker MRN: [id number] DOB:07-Jul-1950, 73 y.o., female Today's Date: 10/13/2023  END OF SESSION:   PT End of Session - 10/13/23 1405     Visit Number 9    Number of Visits 17    Date for PT Re-Evaluation 11/06/23    Authorization Type [employer] DUAL COMPLETE    PT Start Time 1400    PT Stop Time 1438    PT Time Calculation (min) 38 min    Activity Tolerance Patient tolerated treatment well    Behavior During Therapy WFL for tasks assessed/performed            Past Medical History:  Diagnosis Date   Anxiety    Arthritis    all over my body (01/15/2018)   Breast cancer, right breast (HCC) 2000   Rt lumpectomy, Chemo/XRT/rt axillary node    Bronchial asthma    Chronic lower back pain    Depression    Diverticulosis    DVT (deep venous thrombosis) (HCC) 06/2013   RLE   GERD (gastroesophageal reflux disease)    High cholesterol    Hx of adenomatous colonic polyps    Hypertension    Obesity    Osteoarthritis    Personal history of chemotherapy    Personal history of radiation therapy    Pulmonary embolism (HCC) 06/2013   both lungs   Spondylolisthesis    Past Surgical History:  Procedure Laterality Date   BREAST BIOPSY Right 2000   BREAST LUMPECTOMY Right 2000   for CA txed w/ chemo and radiation, right   CARDIAC CATHETERIZATION  03/21/2020   CORONARY ATHERECTOMY N/A 04/03/2022   Procedure: CORONARY ATHERECTOMY;  Surgeon: Swaziland, Peter M, MD;  Location: [facility]IVE [facility];  Service: Cardiovascular;  Laterality: N/A;   CORONARY IMAGING/OCT N/A 04/03/2022   Procedure: INTRAVASCULAR IMAGING/OCT;  Surgeon: Swaziland, Peter M, MD;  Location: [facility] INVASIVE CV LAB;  Service: Cardiovascular;  Laterality: N/A;   CORONARY PRESSURE/FFR STUDY N/A 01/18/2018   Procedure: INTRAVASCULAR PRESSURE WIRE/FFR STUDY;  Surgeon: Verlin Lonni BIRCH, MD;  Location: [facility];  Service: Cardiovascular;  Laterality:  N/A;   CORONARY PRESSURE/FFR STUDY N/A 04/03/2022   Procedure: INTRAVASCULAR PRESSURE WIRE/FFR STUDY;  Surgeon: Swaziland, Peter M, MD;  Location: [facility] INVASIVE CV LAB;  Service: Cardiovascular;  Laterality: N/A;   CORONARY STENT INTERVENTION N/A 04/03/2022   Procedure: CORONARY STENT INTERVENTION;  Surgeon: Swaziland, Peter M, MD;  Location: [facility] INVASIVE CV LAB;  Service: Cardiovascular;  Laterality: N/A;   HERNIA REPAIR     LEFT HEART CATH AND CORONARY ANGIOGRAPHY N/A 01/18/2018   Procedure: LEFT HEART CATH AND CORONARY ANGIOGRAPHY;  Surgeon: Verlin Lonni BIRCH, MD;  Location: [facility];  Service: Cardiovascular;  Laterality: N/A;   LEFT HEART CATH AND CORONARY ANGIOGRAPHY N/A 03/21/2020   Procedure: LEFT HEART CATH AND CORONARY ANGIOGRAPHY;  Surgeon: Darron Deatrice LABOR, MD;  Location: [facility];  Service: Cardiovascular;  Laterality: N/A;   LEFT HEART CATH AND CORONARY ANGIOGRAPHY N/A 04/03/2022   Procedure: LEFT HEART CATH AND CORONARY ANGIOGRAPHY;  Surgeon: Swaziland, Peter M, MD;  Location: [facility] INVASIVE CV LAB;  Service: Cardiovascular;  Laterality: N/A;   TOTAL KNEE ARTHROPLASTY Left 08/11/2023   Procedure: LEFT TOTAL KNEE ARTHROPLASTY;  Surgeon: Vernetta Lonni GRADE, MD;  Location: [facility] OR;  Service: Orthopedics;  Laterality: Left;   TUBAL LIGATION     UMBILICAL HERNIA REPAIR  1983   Patient Active  Problem List   Diagnosis Date Noted   Status post total left knee replacement 08/11/2023   Unilateral primary osteoarthritis, left knee 08/10/2023   Hyperlipidemia 08/01/2020   Asthmatic bronchitis, mild persistent, uncomplicated    Unstable angina (HCC) 01/16/2018   Cellulitis of right arm 10/07/2016   Sepsis (HCC) 10/07/2016   Chest pain 10/07/2016   Dyspnea on exertion 05/24/2015   DVT, lower extremity (HCC) 05/09/2014   Right calf pain 06/21/2013   Pulmonary embolism (HCC) 06/21/2013   Dysphagia 06/27/2011   Cough 06/27/2011   CHEST PAIN 01/18/2009   RHINITIS 04/13/2007    Obstructive chronic bronchitis with acute bronchitis (HCC) 04/13/2007   GERD 04/13/2007   Depression 01/21/2007   OSTEOARTHRITIS 01/21/2007   LOW BACK PAIN 01/21/2007   BREAST CANCER, HX OF 01/21/2007    PCP: Onita Rush, MD  REFERRING PROVIDER: Vernetta Lonni GRADE, MD  REFERRING DIAG: [phone] (ICD-10-CM) - Status post total left knee replacement   THERAPY DIAG:  Acute pain of left knee  Difficulty in walking, not elsewhere classified  Localized edema  Muscle weakness (generalized)  Stiffness of left knee, not elsewhere classified  Rationale for Evaluation and Treatment: Rehabilitation  ONSET DATE: 08/11/23 TKA  SUBJECTIVE:   SUBJECTIVE STATEMENT:  10/13/2023 Patient reports she missed last appt d/t being sick, likely as a result of her medications.   Eval: Pt reports she is doing well with her knee following surgery. Pt notes her L foot at times feels cold and the top of her L foot will hurt. Following her hospitalization, the pt reports she received therapy at [facility] for approx 1 week.  PERTINENT HISTORY: High BMI, arthritis, spondilolisthesis  PAIN:  Are you having pain? Yes: NPRS scale: 0/10 Pain location: Medial L knee Pain description: soreness Aggravating factors: Activity level Relieving factors: pain medication, moving, cold pack  PRECAUTIONS: Knee  RED FLAGS: None   WEIGHT BEARING RESTRICTIONS: No  FALLS:  Has patient fallen in last 6 months? No  LIVING ENVIRONMENT: Lives with: lives alone but daughter is helping Lives in: House/apartment Stairs: Yes: External: 2 steps; none Has following equipment at home: Walker - 2 wheeled, shower chair, and bed side commode  OCCUPATION: Retired  PLOF: Independent  PATIENT GOALS: Good use of my L knee  NEXT MD VISIT: 09/24/23  OBJECTIVE:  Note: Objective measures were completed at Evaluation unless otherwise noted.  DIAGNOSTIC FINDINGS:   PATIENT SURVEYS:  LEFS: 31/80=39%  ability  COGNITION: Overall cognitive status: Within functional limits for tasks assessed     SENSATION: WFL  EDEMA:  Present for the L knee  POSTURE: rounded shoulders, forward head, and flexed trunk   PALPATION: TTP of the L peri-knee especially medially  LOWER EXTREMITY ROM:  Active ROM Left eval Left eval Left  09/17/23  Hip flexion     Hip extension     Hip abduction     Hip adduction     Hip internal rotation     Hip external rotation     Knee flexion 90 95 102  Knee extension 20 lacking     Ankle dorsiflexion     Ankle plantarflexion     Ankle inversion     Ankle eversion      (Blank rows = not tested)  LOWER EXTREMITY MMT:  MMT Right eval Left eval  Hip flexion 3   Hip extension    Hip abduction 3   Hip adduction    Hip internal rotation    Hip external  rotation 3   Knee flexion 3   Knee extension 3   Ankle dorsiflexion    Ankle plantarflexion    Ankle inversion    Ankle eversion     (Blank rows = not tested)   FUNCTIONAL TESTS:  5 times sit to stand: TBA 2 minute walk test: TBA  GAIT: Distance walked: 200' Assistive device utilized: Environmental consultant - 2 wheeled Level of assistance: Modified independence Comments: Decreased pace                                                                                                                                TREATMENT DATE:   [facility] Adult PT Treatment:                                                DATE: 10/13/2023  Therapeutic Exercise: Nustep L6 LE only x 5 min  Seated knee flexion PROM Supine PROM knee ext  Hip flexor stretch passive EOM  QS 5 sec, 2 x 10, 2lb SLR, 2 x 10, 2lb STS x 10  Sink squats 2 x 10, second set with chair for improved mechanics   [facility] Adult PT Treatment:                                                DATE: 10/01/23 Therapeutic Exercise: Nustep L 6 LE only x 3 min, moved seat up to 6 height and reduced tension for 3 minutes (L3)  Hamstring stretch with strap  Seated  knee flexion PROM Supine PROM knee ext  Hip flexor stretch passive EOM  QS 5 sec x 15  SLR x 15 STS x 10  Sink squats  6 inch step ups  x 10  Manual Therapy: Knee mobs seated for knee flexion, Supine for knee extension      [facility] Adult PT Treatment:                                                DATE: 09/25/2023  Therapeutic Exercise: Nustep 8 minutes, level 2, seat 6 to focus on knee flexion AAROM Seated heel slides x 10 Seated knee extension, 3 sec hold x 10  Seated heel slide + LAQ, x 10  LAQ 2# 2x10 Seated h/s curl green TB 10 x 2  Long duration extension stretch in supine, concurrent with vasopneumatic compression at end of session.  PROM knee flexion thomas stretch   Modalities: Vasopneumatic Compression  10 minutes, low compression, 38 deg Leg extended     PATIENT EDUCATION:  Education  details: Eval findings, POC, HEP, self care  Person educated: Patient Education method: Explanation, Demonstration, Tactile cues, Verbal cues, and Handouts Education comprehension: verbalized understanding, returned demonstration, verbal cues required, and tactile cues required  HOME EXERCISE PROGRAM: Access Code: [email or web address] URL: [email or web address] Date: 09/02/2023 Prepared by: Dasie Daft  Exercises - Supine Quad Set  - 2 x daily - 7 x weekly - 1 sets - 10 reps - 5 hold - Active Straight Leg Raise with Quad Set  - 2 x daily - 7 x weekly - 2 sets - 10 reps - 3 hold - Supine Heel Slide  - 2 x daily - 7 x weekly - 1 sets - 10 reps - 5 hold - Supine Heel Slide with Strap  - 2 x daily - 7 x weekly - 1 sets - 5 reps - 20 hold - Seated Table Hamstring Stretch  - 2 x daily - 7 x weekly - 1 sets - 5 reps - 30 hold - Seated Long Arc Quad  - 2 x daily - 7 x weekly - 1 sets - 10 reps - 5 hold  ASSESSMENT:  CLINICAL IMPRESSION: 10/13/2023 Kaycie had good tolerance of today's treatment session, which focused on knee mobility and functional strengthening. Patient is reaching  full knee extension AROM, but has is unable to reach greater than 100 degrees of knee flexion in supine. We will continue to progress per POC as tolerated, in order to reach established rehab goals.      OBJECTIVE IMPAIRMENTS: decreased activity tolerance, difficulty walking, decreased ROM, decreased strength, increased edema, obesity, and pain.   ACTIVITY LIMITATIONS: carrying, lifting, bending, sitting, standing, squatting, sleeping, stairs, transfers, bed mobility, bathing, dressing, hygiene/grooming, locomotion level, and caring for others  PARTICIPATION LIMITATIONS: meal prep, cleaning, laundry, driving, shopping, and community activity  PERSONAL FACTORS: Fitness, Past/current experiences, Time since onset of injury/illness/exacerbation, and 1 comorbidity: high BMI are also affecting patient's functional outcome.   REHAB POTENTIAL: Good  CLINICAL DECISION MAKING: Evolving/moderate complexity  EVALUATION COMPLEXITY: Moderate   GOALS:  SHORT TERM GOALS: Target date: 09/18/23 Pt will be Ind in an initial HEP  Baseline:started Goal status: MET   2.  Pt will demonstrate AROM for the L knee for 10-100d to progress toward functional ROM  Baseline: 20-90 Goal status: MET  LONG TERM GOALS: Target date: 11/06/23  Pt will be Ind in a final HEP to maintain achieved LOF  Baseline:  Goal status: INITIAL  2.  Pt will demonstrate AROM for the L knee for 0-115d for appropriate functional use with sitting and negotiating steps Baseline: 20-90 Goal status: INITIAL  3.  Pt will demonstrate L knee and hip strength of 4/5 or greater for appropriate functional mobility Baseline: 3/5 Goal status: INITIAL  4.  Improve 5xSTS by MCID of 5 and by MCID of 47ft as indication of improved functional mobility  Baseline: TBA when pt is able to walk without the assistance of a [facility] 09/17/23: 390 feet without AD, 5 x STS: 15.2 sec without UE Goal status: ONGOING  5.  Pt will be able to walk Indly  533ft and negotiate 12 steps for community ambulation Baseline:  Goal status: INITIAL  6.  Pt's LEFS score will improve by the MCID to 59% % as indication of improved function  Baseline: 39% Goal status: INITIAL   PLAN:  PT FREQUENCY: 2x/week  PT DURATION: 8 weeks  PLANNED INTERVENTIONS: 97164- PT Re-evaluation, 97110-Therapeutic exercises, 97530- Therapeutic activity, [id number]- Neuromuscular re-education, 97535- Self Care,  02859- Manual therapy, [id number]- Gait training, [phone]- Electrical stimulation (unattended), 97016- Vasopneumatic device, [phone] (1-2 muscles), 20561 (3+ muscles)- Dry Needling, Patient/Family education, Balance training, Stair training, Taping, Joint mobilization, Cryotherapy, and Moist heat  PLAN FOR NEXT SESSION:  assess response to HEP; progress therex as indicated; use of modalities, manual therapy; and TPDN as indicated.    [name], PT, DPT  10/13/2023 2:45 PM

## 2023-10-15 ENCOUNTER — Ambulatory Visit

## 2023-10-15 DIAGNOSIS — R262 Difficulty in walking, not elsewhere classified: Secondary | ICD-10-CM

## 2023-10-15 DIAGNOSIS — M25562 Pain in left knee: Secondary | ICD-10-CM

## 2023-10-15 DIAGNOSIS — R600 Localized edema: Secondary | ICD-10-CM

## 2023-10-15 NOTE — Therapy (Signed)
 OUTPATIENT PHYSICAL THERAPY NOTE   Patient Name: Diane Barker MRN: [id number] DOB:Feb 22, 1951, 73 y.o., female Today's Date: 10/15/2023  END OF SESSION:   PT End of Session - 10/15/23 1408     Visit Number 10    Number of Visits 17    Date for PT Re-Evaluation 11/06/23    Authorization Type [employer] DUAL COMPLETE    PT Start Time 1401    PT Stop Time 1439    PT Time Calculation (min) 38 min    Activity Tolerance Patient tolerated treatment well    Behavior During Therapy WFL for tasks assessed/performed             Past Medical History:  Diagnosis Date   Anxiety    Arthritis    all over my body (01/15/2018)   Breast cancer, right breast (HCC) 2000   Rt lumpectomy, Chemo/XRT/rt axillary node    Bronchial asthma    Chronic lower back pain    Depression    Diverticulosis    DVT (deep venous thrombosis) (HCC) 06/2013   RLE   GERD (gastroesophageal reflux disease)    High cholesterol    Hx of adenomatous colonic polyps    Hypertension    Obesity    Osteoarthritis    Personal history of chemotherapy    Personal history of radiation therapy    Pulmonary embolism (HCC) 06/2013   both lungs   Spondylolisthesis    Past Surgical History:  Procedure Laterality Date   BREAST BIOPSY Right 2000   BREAST LUMPECTOMY Right 2000   for CA txed w/ chemo and radiation, right   CARDIAC CATHETERIZATION  03/21/2020   CORONARY ATHERECTOMY N/A 04/03/2022   Procedure: CORONARY ATHERECTOMY;  Surgeon: Swaziland, Peter M, MD;  Location: [facility] INVASIVE CV [facility];  Service: Cardiovascular;  Laterality: N/A;   CORONARY IMAGING/OCT N/A 04/03/2022   Procedure: INTRAVASCULAR IMAGING/OCT;  Surgeon: Swaziland, Peter M, MD;  Location: [facility] INVASIVE CV LAB;  Service: Cardiovascular;  Laterality: N/A;   CORONARY PRESSURE/FFR STUDY N/A 01/18/2018   Procedure: INTRAVASCULAR PRESSURE WIRE/FFR STUDY;  Surgeon: Verlin Lonni BIRCH, MD;  Location: [facility];  Service: Cardiovascular;   Laterality: N/A;   CORONARY PRESSURE/FFR STUDY N/A 04/03/2022   Procedure: INTRAVASCULAR PRESSURE WIRE/FFR STUDY;  Surgeon: Swaziland, Peter M, MD;  Location: [facility] INVASIVE CV LAB;  Service: Cardiovascular;  Laterality: N/A;   CORONARY STENT INTERVENTION N/A 04/03/2022   Procedure: CORONARY STENT INTERVENTION;  Surgeon: Swaziland, Peter M, MD;  Location: [facility] INVASIVE CV LAB;  Service: Cardiovascular;  Laterality: N/A;   HERNIA REPAIR     LEFT HEART CATH AND CORONARY ANGIOGRAPHY N/A 01/18/2018   Procedure: LEFT HEART CATH AND CORONARY ANGIOGRAPHY;  Surgeon: Verlin Lonni BIRCH, MD;  Location: [facility];  Service: Cardiovascular;  Laterality: N/A;   LEFT HEART CATH AND CORONARY ANGIOGRAPHY N/A 03/21/2020   Procedure: LEFT HEART CATH AND CORONARY ANGIOGRAPHY;  Surgeon: Darron Deatrice LABOR, MD;  Location: [facility];  Service: Cardiovascular;  Laterality: N/A;   LEFT HEART CATH AND CORONARY ANGIOGRAPHY N/A 04/03/2022   Procedure: LEFT HEART CATH AND CORONARY ANGIOGRAPHY;  Surgeon: Swaziland, Peter M, MD;  Location: [facility] INVASIVE CV LAB;  Service: Cardiovascular;  Laterality: N/A;   TOTAL KNEE ARTHROPLASTY Left 08/11/2023   Procedure: LEFT TOTAL KNEE ARTHROPLASTY;  Surgeon: Vernetta Lonni GRADE, MD;  Location: [facility] OR;  Service: Orthopedics;  Laterality: Left;   TUBAL LIGATION     UMBILICAL HERNIA REPAIR  1983   Patient  Active Problem List   Diagnosis Date Noted   Status post total left knee replacement 08/11/2023   Unilateral primary osteoarthritis, left knee 08/10/2023   Hyperlipidemia 08/01/2020   Asthmatic bronchitis, mild persistent, uncomplicated    Unstable angina (HCC) 01/16/2018   Cellulitis of right arm 10/07/2016   Sepsis (HCC) 10/07/2016   Chest pain 10/07/2016   Dyspnea on exertion 05/24/2015   DVT, lower extremity (HCC) 05/09/2014   Right calf pain 06/21/2013   Pulmonary embolism (HCC) 06/21/2013   Dysphagia 06/27/2011   Cough 06/27/2011   CHEST PAIN 01/18/2009   RHINITIS  04/13/2007   Obstructive chronic bronchitis with acute bronchitis (HCC) 04/13/2007   GERD 04/13/2007   Depression 01/21/2007   OSTEOARTHRITIS 01/21/2007   LOW BACK PAIN 01/21/2007   BREAST CANCER, HX OF 01/21/2007    PCP: Onita Rush, MD  REFERRING PROVIDER: Vernetta Lonni GRADE, MD  REFERRING DIAG: [id number] (ICD-10-CM) - Status post total left knee replacement   THERAPY DIAG:  Acute pain of left knee  Difficulty in walking, not elsewhere classified  Localized edema  Rationale for Evaluation and Treatment: Rehabilitation  ONSET DATE: 08/11/23 TKA  SUBJECTIVE:   SUBJECTIVE STATEMENT:  10/15/2023 Patient reports she missed last appt d/t being sick, likely as a result of her medications.   Eval: Pt reports she is doing well with her knee following surgery. Pt notes her L foot at times feels cold and the top of her L foot will hurt. Following her hospitalization, the pt reports she received therapy at [facility] for approx 1 week.  PERTINENT HISTORY: High BMI, arthritis, spondilolisthesis  PAIN:  Are you having pain? Yes: NPRS scale: 0/10 Pain location: Medial L knee Pain description: soreness Aggravating factors: Activity level Relieving factors: pain medication, moving, cold pack  PRECAUTIONS: Knee  RED FLAGS: None   WEIGHT BEARING RESTRICTIONS: No  FALLS:  Has patient fallen in last 6 months? No  LIVING ENVIRONMENT: Lives with: lives alone but daughter is helping Lives in: House/apartment Stairs: Yes: External: 2 steps; none Has following equipment at home: Walker - 2 wheeled, shower chair, and bed side commode  OCCUPATION: Retired  PLOF: Independent  PATIENT GOALS: Good use of my L knee  NEXT MD VISIT: 09/24/23  OBJECTIVE:  Note: Objective measures were completed at Evaluation unless otherwise noted.  DIAGNOSTIC FINDINGS:   PATIENT SURVEYS:  LEFS: 31/80=39% ability  COGNITION: Overall cognitive status: Within functional limits for  tasks assessed     SENSATION: WFL  EDEMA:  Present for the L knee  POSTURE: rounded shoulders, forward head, and flexed trunk   PALPATION: TTP of the L peri-knee especially medially  LOWER EXTREMITY ROM:  Active ROM Left eval Left eval Left  09/17/23  Hip flexion     Hip extension     Hip abduction     Hip adduction     Hip internal rotation     Hip external rotation     Knee flexion 90 95 102  Knee extension 20 lacking     Ankle dorsiflexion     Ankle plantarflexion     Ankle inversion     Ankle eversion      (Blank rows = not tested)  LOWER EXTREMITY MMT:  MMT Right eval Left eval  Hip flexion 3   Hip extension    Hip abduction 3   Hip adduction    Hip internal rotation    Hip external rotation 3   Knee flexion 3   Knee extension  3   Ankle dorsiflexion    Ankle plantarflexion    Ankle inversion    Ankle eversion     (Blank rows = not tested)   FUNCTIONAL TESTS:  5 times sit to stand: TBA 2 minute walk test: TBA  GAIT: Distance walked: 200' Assistive device utilized: Environmental consultant - 2 wheeled Level of assistance: Modified independence Comments: Decreased pace                                                                                                                                TREATMENT DATE:   [facility] Adult PT Treatment:                                                DATE: 10/15/2023  Therapeutic Exercise: Recumbent bike x4 min backwards, x2 min forward Supine wall/heel slides 2 x 10  Supine long-duration heel slide with foot on wall 5 x 30 sec Seated knee flexion GTB 2x 10 Supine PROM knee ext  STS 2 x 10   [facility] Adult PT Treatment:                                                DATE: 10/13/2023  Therapeutic Exercise: Nustep L6 LE only x 5 min  Seated knee flexion PROM Supine PROM knee ext  Hip flexor stretch passive EOM  QS 5 sec, 2 x 10, 2lb SLR, 2 x 10, 2lb STS x 10  Sink squats 2 x 10, second set with chair for improved  mechanics   [facility] Adult PT Treatment:                                                DATE: 10/01/23 Therapeutic Exercise: Nustep L 6 LE only x 3 min, moved seat up to 6 height and reduced tension for 3 minutes (L3)  Hamstring stretch with strap  Seated knee flexion PROM Supine PROM knee ext  Hip flexor stretch passive EOM  QS 5 sec x 15  SLR x 15 STS x 10  Sink squats  6 inch step ups  x 10  Manual Therapy: Knee mobs seated for knee flexion, Supine for knee extension        PATIENT EDUCATION:  Education details: Eval findings, POC, HEP, self care  Person educated: Patient Education method: Explanation, Demonstration, Tactile cues, Verbal cues, and Handouts Education comprehension: verbalized understanding, returned demonstration, verbal cues required, and tactile cues required  HOME EXERCISE PROGRAM: Access Code: [email or web address] URL: [email or web address] Date: 09/02/2023 Prepared by: Dasie Daft  Exercises - Supine Quad Set  - 2 x daily - 7 x weekly - 1 sets - 10 reps - 5 hold - Active Straight Leg Raise with Quad Set  - 2 x daily - 7 x weekly - 2 sets - 10 reps - 3 hold - Supine Heel Slide  - 2 x daily - 7 x weekly - 1 sets - 10 reps - 5 hold - Supine Heel Slide with Strap  - 2 x daily - 7 x weekly - 1 sets - 5 reps - 20 hold - Seated Table Hamstring Stretch  - 2 x daily - 7 x weekly - 1 sets - 5 reps - 30 hold - Seated Long Arc Quad  - 2 x daily - 7 x weekly - 1 sets - 10 reps - 5 hold  ASSESSMENT:  CLINICAL IMPRESSION: 10/15/2023 Kissa had good tolerance of today's treatment session, which focused on progression of knee flexion ROM and strengthening activities. We will continue to progress per POC as tolerated, in order to reach established rehab goals.      OBJECTIVE IMPAIRMENTS: decreased activity tolerance, difficulty walking, decreased ROM, decreased strength, increased edema, obesity, and pain.   ACTIVITY LIMITATIONS: carrying, lifting, bending, sitting,  standing, squatting, sleeping, stairs, transfers, bed mobility, bathing, dressing, hygiene/grooming, locomotion level, and caring for others  PARTICIPATION LIMITATIONS: meal prep, cleaning, laundry, driving, shopping, and community activity  PERSONAL FACTORS: Fitness, Past/current experiences, Time since onset of injury/illness/exacerbation, and 1 comorbidity: high BMI are also affecting patient's functional outcome.   REHAB POTENTIAL: Good  CLINICAL DECISION MAKING: Evolving/moderate complexity  EVALUATION COMPLEXITY: Moderate   GOALS:  SHORT TERM GOALS: Target date: 09/18/23 Pt will be Ind in an initial HEP  Baseline:started Goal status: MET   2.  Pt will demonstrate AROM for the L knee for 10-100d to progress toward functional ROM  Baseline: 20-90 Goal status: MET  LONG TERM GOALS: Target date: 11/06/23  Pt will be Ind in a final HEP to maintain achieved LOF  Baseline:  Goal status: INITIAL  2.  Pt will demonstrate AROM for the L knee for 0-115d for appropriate functional use with sitting and negotiating steps Baseline: 20-90 Goal status: INITIAL  3.  Pt will demonstrate L knee and hip strength of 4/5 or greater for appropriate functional mobility Baseline: 3/5 Goal status: INITIAL  4.  Improve 5xSTS by MCID of 5 and by MCID of 80ft as indication of improved functional mobility  Baseline: TBA when pt is able to walk without the assistance of a [facility] 09/17/23: 390 feet without AD, 5 x STS: 15.2 sec without UE Goal status: ONGOING  5.  Pt will be able to walk Indly 532ft and negotiate 12 steps for community ambulation Baseline:  Goal status: INITIAL  6.  Pt's LEFS score will improve by the MCID to 59% % as indication of improved function  Baseline: 39% Goal status: INITIAL   PLAN:  PT FREQUENCY: 2x/week  PT DURATION: 8 weeks  PLANNED INTERVENTIONS: 97164- PT Re-evaluation, 97110-Therapeutic exercises, 97530- Therapeutic activity, [id number]- Neuromuscular  re-education, 97535- Self Care, [id number]- Manual therapy, [id number]- Gait training, [phone]- Electrical stimulation (unattended), 97016- Vasopneumatic device, 20560 (1-2 muscles), 20561 (3+ muscles)- Dry Needling, Patient/Family education, Balance training, Stair training, Taping, Joint mobilization, Cryotherapy, and Moist heat  PLAN FOR NEXT SESSION:  assess response to HEP; progress therex as indicated; use of modalities, manual therapy; and TPDN as indicated.    [name], PT, DPT  10/15/2023 2:43 PM

## 2023-10-20 ENCOUNTER — Encounter

## 2023-10-20 ENCOUNTER — Ambulatory Visit: Payer: Self-pay | Attending: Orthopaedic Surgery

## 2023-10-20 DIAGNOSIS — R262 Difficulty in walking, not elsewhere classified: Secondary | ICD-10-CM | POA: Diagnosis present

## 2023-10-20 DIAGNOSIS — M25562 Pain in left knee: Secondary | ICD-10-CM | POA: Insufficient documentation

## 2023-10-20 DIAGNOSIS — M6281 Muscle weakness (generalized): Secondary | ICD-10-CM | POA: Diagnosis present

## 2023-10-20 DIAGNOSIS — R600 Localized edema: Secondary | ICD-10-CM | POA: Insufficient documentation

## 2023-10-20 NOTE — Therapy (Signed)
 OUTPATIENT PHYSICAL THERAPY NOTE   Patient Name: Diane Barker MRN: [id number] DOB:1950-04-17, 73 y.o., female Today's Date: 10/20/2023  END OF SESSION:   PT End of Session - 10/20/23 1442     Visit Number 11    Number of Visits 17    Date for PT Re-Evaluation 11/06/23    Authorization Type [employer] DUAL COMPLETE    PT Start Time 1445    PT Stop Time 1523    PT Time Calculation (min) 38 min    Activity Tolerance Patient tolerated treatment well    Behavior During Therapy WFL for tasks assessed/performed         Past Medical History:  Diagnosis Date   Anxiety    Arthritis    all over my body (01/15/2018)   Breast cancer, right breast (HCC) 2000   Rt lumpectomy, Chemo/XRT/rt axillary node    Bronchial asthma    Chronic lower back pain    Depression    Diverticulosis    DVT (deep venous thrombosis) (HCC) 06/2013   RLE   GERD (gastroesophageal reflux disease)    High cholesterol    Hx of adenomatous colonic polyps    Hypertension    Obesity    Osteoarthritis    Personal history of chemotherapy    Personal history of radiation therapy    Pulmonary embolism (HCC) 06/2013   both lungs   Spondylolisthesis    Past Surgical History:  Procedure Laterality Date   BREAST BIOPSY Right 2000   BREAST LUMPECTOMY Right 2000   for CA txed w/ chemo and radiation, right   CARDIAC CATHETERIZATION  03/21/2020   CORONARY ATHERECTOMY N/A 04/03/2022   Procedure: CORONARY ATHERECTOMY;  Surgeon: Swaziland, Peter M, MD;  Location: [facility];  Service: Cardiovascular;  Laterality: N/A;   CORONARY IMAGING/OCT N/A 04/03/2022   Procedure: INTRAVASCULAR IMAGING/OCT;  Surgeon: Swaziland, Peter M, MD;  Location: [facility] CV [facility];  Service: Cardiovascular;  Laterality: N/A;   CORONARY PRESSURE/FFR STUDY N/A 01/18/2018   Procedure: INTRAVASCULAR PRESSURE WIRE/FFR STUDY;  Surgeon: Verlin Lonni BIRCH, MD;  Location: [facility];  Service: Cardiovascular;  Laterality: N/A;    CORONARY PRESSURE/FFR STUDY N/A 04/03/2022   Procedure: INTRAVASCULAR PRESSURE WIRE/FFR STUDY;  Surgeon: Swaziland, Peter M, MD;  Location: [facility] INVASIVE CV LAB;  Service: Cardiovascular;  Laterality: N/A;   CORONARY STENT INTERVENTION N/A 04/03/2022   Procedure: CORONARY STENT INTERVENTION;  Surgeon: Swaziland, Peter M, MD;  Location: [facility] INVASIVE CV LAB;  Service: Cardiovascular;  Laterality: N/A;   HERNIA REPAIR     LEFT HEART CATH AND CORONARY ANGIOGRAPHY N/A 01/18/2018   Procedure: LEFT HEART CATH AND CORONARY ANGIOGRAPHY;  Surgeon: Verlin Lonni BIRCH, MD;  Location: [facility];  Service: Cardiovascular;  Laterality: N/A;   LEFT HEART CATH AND CORONARY ANGIOGRAPHY N/A 03/21/2020   Procedure: LEFT HEART CATH AND CORONARY ANGIOGRAPHY;  Surgeon: Darron Deatrice LABOR, MD;  Location: [facility];  Service: Cardiovascular;  Laterality: N/A;   LEFT HEART CATH AND CORONARY ANGIOGRAPHY N/A 04/03/2022   Procedure: LEFT HEART CATH AND CORONARY ANGIOGRAPHY;  Surgeon: Swaziland, Peter M, MD;  Location: [facility] INVASIVE CV LAB;  Service: Cardiovascular;  Laterality: N/A;   TOTAL KNEE ARTHROPLASTY Left 08/11/2023   Procedure: LEFT TOTAL KNEE ARTHROPLASTY;  Surgeon: Vernetta Lonni GRADE, MD;  Location: [facility] OR;  Service: Orthopedics;  Laterality: Left;   TUBAL LIGATION     UMBILICAL HERNIA REPAIR  1983   Patient Active Problem List  Diagnosis Date Noted   Status post total left knee replacement 08/11/2023   Unilateral primary osteoarthritis, left knee 08/10/2023   Hyperlipidemia 08/01/2020   Asthmatic bronchitis, mild persistent, uncomplicated    Unstable angina (HCC) 01/16/2018   Cellulitis of right arm 10/07/2016   Sepsis (HCC) 10/07/2016   Chest pain 10/07/2016   Dyspnea on exertion 05/24/2015   DVT, lower extremity (HCC) 05/09/2014   Right calf pain 06/21/2013   Pulmonary embolism (HCC) 06/21/2013   Dysphagia 06/27/2011   Cough 06/27/2011   CHEST PAIN 01/18/2009   RHINITIS 04/13/2007    Obstructive chronic bronchitis with acute bronchitis (HCC) 04/13/2007   GERD 04/13/2007   Depression 01/21/2007   OSTEOARTHRITIS 01/21/2007   LOW BACK PAIN 01/21/2007   BREAST CANCER, HX OF 01/21/2007    PCP: Onita Rush, MD  REFERRING PROVIDER: Vernetta Lonni GRADE, MD  REFERRING DIAG: [id number] (ICD-10-CM) - Status post total left knee replacement   THERAPY DIAG:  Acute pain of left knee  Difficulty in walking, not elsewhere classified  Localized edema  Muscle weakness (generalized)  Rationale for Evaluation and Treatment: Rehabilitation  ONSET DATE: 08/11/23 TKA  Next MD appt: 10/22/23  SUBJECTIVE:   SUBJECTIVE STATEMENT: Patient reports no current pain, has been working on bending at home.  Eval: Pt reports she is doing well with her knee following surgery. Pt notes her L foot at times feels cold and the top of her L foot will hurt. Following her hospitalization, the pt reports she received therapy at [facility] for approx 1 week.  PERTINENT HISTORY: High BMI, arthritis, spondilolisthesis  PAIN:  Are you having pain? Yes: NPRS scale: 0/10 Pain location: Medial L knee Pain description: soreness Aggravating factors: Activity level Relieving factors: pain medication, moving, cold pack  PRECAUTIONS: Knee  RED FLAGS: None   WEIGHT BEARING RESTRICTIONS: No  FALLS:  Has patient fallen in last 6 months? No  LIVING ENVIRONMENT: Lives with: lives alone but daughter is helping Lives in: House/apartment Stairs: Yes: External: 2 steps; none Has following equipment at home: Walker - 2 wheeled, shower chair, and bed side commode  OCCUPATION: Retired  PLOF: Independent  PATIENT GOALS: Good use of my L knee  NEXT MD VISIT: 09/24/23  OBJECTIVE:  Note: Objective measures were completed at Evaluation unless otherwise noted.  DIAGNOSTIC FINDINGS:   PATIENT SURVEYS:  LEFS: 31/80=39% ability  COGNITION: Overall cognitive status: Within functional limits  for tasks assessed     SENSATION: WFL  EDEMA:  Present for the L knee  POSTURE: rounded shoulders, forward head, and flexed trunk   PALPATION: TTP of the L peri-knee especially medially  LOWER EXTREMITY ROM:  Active ROM Left eval Left eval Left  09/17/23 Left 10/20/23  Hip flexion      Hip extension      Hip abduction      Hip adduction      Hip internal rotation      Hip external rotation      Knee flexion 90 95 102 109  Knee extension 20 lacking    -7  Ankle dorsiflexion      Ankle plantarflexion      Ankle inversion      Ankle eversion       (Blank rows = not tested)  LOWER EXTREMITY MMT:  MMT Right eval Left eval  Hip flexion 3   Hip extension    Hip abduction 3   Hip adduction    Hip internal rotation    Hip external  rotation 3   Knee flexion 3   Knee extension 3   Ankle dorsiflexion    Ankle plantarflexion    Ankle inversion    Ankle eversion     (Blank rows = not tested)   FUNCTIONAL TESTS:  5 times sit to stand: TBA 2 minute walk test: TBA  GAIT: Distance walked: 200' Assistive device utilized: Environmental consultant - 2 wheeled Level of assistance: Modified independence Comments: Decreased pace                                                                                                                                TREATMENT DATE:  [facility] Adult PT Treatment:                                                DATE: 10/20/23 Therapeutic Exercise: Recumbent bike x4 min backwards, x2 min forward Supine wall/heel slides 2 x 10  Supine long-duration heel slide with foot on wall 5 x 30 sec Seated knee flexion GTB 2x 10 LLLD with 1/2 foam roll x3'  STS 2 x 10 arms crossed SLR with quad set 2x10  [facility] Adult PT Treatment:                                                DATE: 10/15/2023  Therapeutic Exercise: Recumbent bike x4 min backwards, x2 min forward Supine wall/heel slides 2 x 10  Supine long-duration heel slide with foot on wall 5 x 30 sec Seated knee  flexion GTB 2x 10 Supine PROM knee ext  STS 2 x 10   [facility] Adult PT Treatment:                                                DATE: 10/13/2023  Therapeutic Exercise: Nustep L6 LE only x 5 min  Seated knee flexion PROM Supine PROM knee ext  Hip flexor stretch passive EOM  QS 5 sec, 2 x 10, 2lb SLR, 2 x 10, 2lb STS x 10  Sink squats 2 x 10, second set with chair for improved mechanics    PATIENT EDUCATION:  Education details: Eval findings, POC, HEP, self care  Person educated: Patient Education method: Explanation, Demonstration, Tactile cues, Verbal cues, and Handouts Education comprehension: verbalized understanding, returned demonstration, verbal cues required, and tactile cues required  HOME EXERCISE PROGRAM: Access Code: [email or web address] URL: [email or web address] Date: 09/02/2023 Prepared by: Dasie Daft  Exercises - Supine Quad Set  - 2 x daily - 7 x weekly - 1 sets - 10 reps - 5  hold - Active Straight Leg Raise with Quad Set  - 2 x daily - 7 x weekly - 2 sets - 10 reps - 3 hold - Supine Heel Slide  - 2 x daily - 7 x weekly - 1 sets - 10 reps - 5 hold - Supine Heel Slide with Strap  - 2 x daily - 7 x weekly - 1 sets - 5 reps - 20 hold - Seated Table Hamstring Stretch  - 2 x daily - 7 x weekly - 1 sets - 5 reps - 30 hold - Seated Long Arc Quad  - 2 x daily - 7 x weekly - 1 sets - 10 reps - 5 hold  ASSESSMENT:  CLINICAL IMPRESSION: Patient presents to PT reporting no current pain and that she has been working on the knee flexion at home. Session today continued to focus on flexion and extension ROM with 7-109 achieved today. Patient was able to tolerate all prescribed exercises with no adverse effects. Patient continues to benefit from skilled PT services and should be progressed as able to improve functional independence.    OBJECTIVE IMPAIRMENTS: decreased activity tolerance, difficulty walking, decreased ROM, decreased strength, increased edema, obesity, and  pain.   ACTIVITY LIMITATIONS: carrying, lifting, bending, sitting, standing, squatting, sleeping, stairs, transfers, bed mobility, bathing, dressing, hygiene/grooming, locomotion level, and caring for others  PARTICIPATION LIMITATIONS: meal prep, cleaning, laundry, driving, shopping, and community activity  PERSONAL FACTORS: Fitness, Past/current experiences, Time since onset of injury/illness/exacerbation, and 1 comorbidity: high BMI are also affecting patient's functional outcome.   REHAB POTENTIAL: Good  CLINICAL DECISION MAKING: Evolving/moderate complexity  EVALUATION COMPLEXITY: Moderate   GOALS:  SHORT TERM GOALS: Target date: 09/18/23 Pt will be Ind in an initial HEP  Baseline:started Goal status: MET   2.  Pt will demonstrate AROM for the L knee for 10-100d to progress toward functional ROM  Baseline: 20-90 Goal status: MET  LONG TERM GOALS: Target date: 11/06/23  Pt will be Ind in a final HEP to maintain achieved LOF  Baseline:  Goal status: INITIAL  2.  Pt will demonstrate AROM for the L knee for 0-115d for appropriate functional use with sitting and negotiating steps Baseline: 20-90 Goal status: INITIAL  3.  Pt will demonstrate L knee and hip strength of 4/5 or greater for appropriate functional mobility Baseline: 3/5 Goal status: INITIAL  4.  Improve 5xSTS by MCID of 5 and by MCID of 49ft as indication of improved functional mobility  Baseline: TBA when pt is able to walk without the assistance of a [facility] 09/17/23: 390 feet without AD, 5 x STS: 15.2 sec without UE Goal status: ONGOING  5.  Pt will be able to walk Indly 589ft and negotiate 12 steps for community ambulation Baseline:  Goal status: INITIAL  6.  Pt's LEFS score will improve by the MCID to 59% % as indication of improved function  Baseline: 39% Goal status: INITIAL   PLAN:  PT FREQUENCY: 2x/week  PT DURATION: 8 weeks  PLANNED INTERVENTIONS: 97164- PT Re-evaluation, 97110-Therapeutic  exercises, 97530- Therapeutic activity, [id number]- Neuromuscular re-education, 97535- Self Care, [id number]- Manual therapy, [id number]- Gait training, [id number]- Electrical stimulation (unattended), 97016- Vasopneumatic device, 20560 (1-2 muscles), 20561 (3+ muscles)- Dry Needling, Patient/Family education, Balance training, Stair training, Taping, Joint mobilization, Cryotherapy, and Moist heat  PLAN FOR NEXT SESSION:  assess response to HEP; progress therex as indicated; use of modalities, manual therapy; and TPDN as indicated.    Corean Pouch PTA  10/20/2023  3:24 PM

## 2023-10-22 ENCOUNTER — Encounter: Payer: Self-pay | Admitting: Orthopaedic Surgery

## 2023-10-22 ENCOUNTER — Ambulatory Visit: Admitting: Orthopaedic Surgery

## 2023-10-22 DIAGNOSIS — Z96652 Presence of left artificial knee joint: Secondary | ICD-10-CM

## 2023-10-22 NOTE — Progress Notes (Signed)
 The patient is now 10 weeks status post a left total knee replacement to treat significant left knee arthritis.  She reports pretty good range of motion and that she is still in physical therapy.  She says she still uncomfortable getting out of bed and out of the car and her legs are weak but overall she is making progress.  She is walking without any assistive ice.  She is 73 years old.  On exam her extension is almost full and her flexion is past 100 degrees.  The knee looks good overall.  There is slightly swollen and slightly warm which is to be expected but it is ligamentously stable.  From our standpoint we will see her back in 3 months.  Will have a standing AP and lateral of her left knee at that visit.  If there is any issues before then she knows to reach out and let us  know.

## 2023-10-27 ENCOUNTER — Ambulatory Visit

## 2023-10-27 DIAGNOSIS — M25562 Pain in left knee: Secondary | ICD-10-CM | POA: Diagnosis not present

## 2023-10-27 DIAGNOSIS — R262 Difficulty in walking, not elsewhere classified: Secondary | ICD-10-CM

## 2023-10-27 DIAGNOSIS — R600 Localized edema: Secondary | ICD-10-CM

## 2023-10-27 NOTE — Therapy (Signed)
 OUTPATIENT PHYSICAL THERAPY NOTE   Patient Name: Diane Barker MRN: [id number] DOB:1950-03-30, 73 y.o., female Today's Date: 10/27/2023  END OF SESSION:   PT End of Session - 10/27/23 1352     Visit Number 12    Number of Visits 17    Date for PT Re-Evaluation 11/06/23    Authorization Type [employer] DUAL COMPLETE    PT Start Time 1400    PT Stop Time 1438    PT Time Calculation (min) 38 min    Activity Tolerance Patient tolerated treatment well    Behavior During Therapy WFL for tasks assessed/performed          Past Medical History:  Diagnosis Date   Anxiety    Arthritis    all over my body (01/15/2018)   Breast cancer, right breast (HCC) 2000   Rt lumpectomy, Chemo/XRT/rt axillary node    Bronchial asthma    Chronic lower back pain    Depression    Diverticulosis    DVT (deep venous thrombosis) (HCC) 06/2013   RLE   GERD (gastroesophageal reflux disease)    High cholesterol    Hx of adenomatous colonic polyps    Hypertension    Obesity    Osteoarthritis    Personal history of chemotherapy    Personal history of radiation therapy    Pulmonary embolism (HCC) 06/2013   both lungs   Spondylolisthesis    Past Surgical History:  Procedure Laterality Date   BREAST BIOPSY Right 2000   BREAST LUMPECTOMY Right 2000   for CA txed w/ chemo and radiation, right   CARDIAC CATHETERIZATION  03/21/2020   CORONARY ATHERECTOMY N/A 04/03/2022   Procedure: CORONARY ATHERECTOMY;  Surgeon: Swaziland, Peter M, MD;  Location: [facility] INVASIVE CV [facility];  Service: Cardiovascular;  Laterality: N/A;   CORONARY IMAGING/OCT N/A 04/03/2022   Procedure: INTRAVASCULAR IMAGING/OCT;  Surgeon: Swaziland, Peter M, MD;  Location: [facility] INVASIVE CV LAB;  Service: Cardiovascular;  Laterality: N/A;   CORONARY PRESSURE/FFR STUDY N/A 01/18/2018   Procedure: INTRAVASCULAR PRESSURE WIRE/FFR STUDY;  Surgeon: Verlin Lonni BIRCH, MD;  Location: [facility] INV[facility] CV [facility];  Service: Cardiovascular;  Laterality: N/A;    CORONARY PRESSURE/FFR STUDY N/A 04/03/2022   Procedure: INTRAVASCULAR PRESSURE WIRE/FFR STUDY;  Surgeon: Swaziland, Peter M, MD;  Location: [facility] INVASIVE CV LAB;  Service: Cardiovascular;  Laterality: N/A;   CORONARY STENT INTERVENTION N/A 04/03/2022   Procedure: CORONARY STENT INTERVENTION;  Surgeon: Swaziland, Peter M, MD;  Location: [facility] INVASIVE CV LAB;  Service: Cardiovascular;  Laterality: N/A;   HERNIA REPAIR     LEFT HEART CATH AND CORONARY ANGIOGRAPHY N/A 01/18/2018   Procedure: LEFT HEART CATH AND CORONARY ANGIOGRAPHY;  Surgeon: Verlin Lonni BIRCH, MD;  Location: [facility];  Service: Cardiovascular;  Laterality: N/A;   LEFT HEART CATH AND CORONARY ANGIOGRAPHY N/A 03/21/2020   Procedure: LEFT HEART CATH AND CORONARY ANGIOGRAPHY;  Surgeon: Darron Deatrice LABOR, MD;  Location: [facility] IN[facility];  Service: Cardiovascular;  Laterality: N/A;   LEFT HEART CATH AND CORONARY ANGIOGRAPHY N/A 04/03/2022   Procedure: LEFT HEART CATH AND CORONARY ANGIOGRAPHY;  Surgeon: Swaziland, Peter M, MD;  Location: [facility] INVASIVE CV LAB;  Service: Cardiovascular;  Laterality: N/A;   TOTAL KNEE ARTHROPLASTY Left 08/11/2023   Procedure: LEFT TOTAL KNEE ARTHROPLASTY;  Surgeon: Vernetta Lonni GRADE, MD;  Location: [facility] OR;  Service: Orthopedics;  Laterality: Left;   TUBAL LIGATION     UMBILICAL HERNIA REPAIR  1983   Patient Active Problem List  Diagnosis Date Noted   Status post total left knee replacement 08/11/2023   Unilateral primary osteoarthritis, left knee 08/10/2023   Hyperlipidemia 08/01/2020   Asthmatic bronchitis, mild persistent, uncomplicated    Unstable angina (HCC) 01/16/2018   Cellulitis of right arm 10/07/2016   Sepsis (HCC) 10/07/2016   Chest pain 10/07/2016   Dyspnea on exertion 05/24/2015   DVT, lower extremity (HCC) 05/09/2014   Right calf pain 06/21/2013   Pulmonary embolism (HCC) 06/21/2013   Dysphagia 06/27/2011   Cough 06/27/2011   CHEST PAIN 01/18/2009   RHINITIS 04/13/2007    Obstructive chronic bronchitis with acute bronchitis (HCC) 04/13/2007   GERD 04/13/2007   Depression 01/21/2007   OSTEOARTHRITIS 01/21/2007   LOW BACK PAIN 01/21/2007   BREAST CANCER, HX OF 01/21/2007    PCP: Onita Rush, MD  REFERRING PROVIDER: Vernetta Lonni GRADE, MD  REFERRING DIAG: [phone] (ICD-10-CM) - Status post total left knee replacement   THERAPY DIAG:  Acute pain of left knee  Difficulty in walking, not elsewhere classified  Localized edema  Rationale for Evaluation and Treatment: Rehabilitation  ONSET DATE: 08/11/23 TKA  Next MD appt: 10/22/23  SUBJECTIVE:   SUBJECTIVE STATEMENT: Patient reports that both of her legs are hurting, she states behind the knee caps. She saw MD last week who stated everything is going well. She also states that she has noticed foot and ankle pain after the wall slides, will DC these today.  Eval: Pt reports she is doing well with her knee following surgery. Pt notes her L foot at times feels cold and the top of her L foot will hurt. Following her hospitalization, the pt reports she received therapy at [facility] for approx 1 week.  PERTINENT HISTORY: High BMI, arthritis, spondilolisthesis  PAIN:  Are you having pain? Yes: NPRS scale: 0/10 Pain location: Medial L knee Pain description: soreness Aggravating factors: Activity level Relieving factors: pain medication, moving, cold pack  PRECAUTIONS: Knee  RED FLAGS: None   WEIGHT BEARING RESTRICTIONS: No  FALLS:  Has patient fallen in last 6 months? No  LIVING ENVIRONMENT: Lives with: lives alone but daughter is helping Lives in: House/apartment Stairs: Yes: External: 2 steps; none Has following equipment at home: Walker - 2 wheeled, shower chair, and bed side commode  OCCUPATION: Retired  PLOF: Independent  PATIENT GOALS: Good use of my L knee  NEXT MD VISIT: 09/24/23  OBJECTIVE:  Note: Objective measures were completed at Evaluation unless otherwise  noted.  DIAGNOSTIC FINDINGS:   PATIENT SURVEYS:  LEFS: 31/80=39% ability  COGNITION: Overall cognitive status: Within functional limits for tasks assessed     SENSATION: WFL  EDEMA:  Present for the L knee  POSTURE: rounded shoulders, forward head, and flexed trunk   PALPATION: TTP of the L peri-knee especially medially  LOWER EXTREMITY ROM:  Active ROM Left eval Left eval Left  09/17/23 Left 10/20/23  Hip flexion      Hip extension      Hip abduction      Hip adduction      Hip internal rotation      Hip external rotation      Knee flexion 90 95 102 109  Knee extension 20 lacking    -7  Ankle dorsiflexion      Ankle plantarflexion      Ankle inversion      Ankle eversion       (Blank rows = not tested)  LOWER EXTREMITY MMT:  MMT Right eval Left eval  Hip flexion 3   Hip extension    Hip abduction 3   Hip adduction    Hip internal rotation    Hip external rotation 3   Knee flexion 3   Knee extension 3   Ankle dorsiflexion    Ankle plantarflexion    Ankle inversion    Ankle eversion     (Blank rows = not tested)   FUNCTIONAL TESTS:  5 times sit to stand: TBA 2 minute walk test: TBA  GAIT: Distance walked: 200' Assistive device utilized: Environmental consultant - 2 wheeled Level of assistance: Modified independence Comments: Decreased pace                                                                                                                                TREATMENT DATE: [facility] Adult PT Treatment:                                                DATE: 10/27/23 Therapeutic Exercise: Recumbent bike x4 min backwards, x2 min forward Seated knee flexion GTB 2x 10 LLLD with 1/2 foam roll x4'  STS 2 x 10 arms crossed  SLR with quad set 2x10 SAQ over bolster 2# LLE 3 hold at top 2x10 - cues for controlled descent  LAQ 2# 2x10 LLE Seated heel slides 2x10 HEP update and review   [facility] Adult PT Treatment:                                                DATE:  10/20/23 Therapeutic Exercise: Recumbent bike x4 min backwards, x2 min forward Supine wall/heel slides 2 x 10  Supine long-duration heel slide with foot on wall 5 x 30 sec Seated knee flexion GTB 2x 10 LLLD with 1/2 foam roll x3'  STS 2 x 10 arms crossed SLR with quad set 2x10  [facility] Adult PT Treatment:                                                DATE: 10/15/2023  Therapeutic Exercise: Recumbent bike x4 min backwards, x2 min forward Supine wall/heel slides 2 x 10  Supine long-duration heel slide with foot on wall 5 x 30 sec Seated knee flexion GTB 2x 10 Supine PROM knee ext  STS 2 x 10      PATIENT EDUCATION:  Education details: Eval findings, POC, HEP, self care  Person educated: Patient Education method: Explanation, Demonstration, Tactile cues, Verbal cues, and Handouts Education comprehension: verbalized understanding, returned demonstration, verbal cues required, and tactile cues required  HOME EXERCISE PROGRAM: Access Code: [email or web address] URL: [email or web address] Date: 10/27/2023 Prepared by: Corean Pouch  Exercises - Supine Quad Set  - 2 x daily - 7 x weekly - 1 sets - 10 reps - 5 hold - Active Straight Leg Raise with Quad Set  - 2 x daily - 7 x weekly - 2 sets - 10 reps - 3 hold - Supine Heel Slide with Strap  - 2 x daily - 7 x weekly - 1 sets - 5 reps - 20 hold - Seated Table Hamstring Stretch  - 2 x daily - 7 x weekly - 1 sets - 5 reps - 30 hold - Seated Long Arc Quad  - 2 x daily - 7 x weekly - 1 sets - 10 reps - 5 hold - Seated Knee Extension Stretch with Chair  - 2 x daily - 7 x weekly - as long as possible hold  ASSESSMENT:  CLINICAL IMPRESSION: Patient presents to PT reporting pain in BIL LE today, she describes behind the kneecaps. She saw surgeon last week who is pleased with how the LE is healing, did state that she needs to work on extension. Updated and reprinted HEP today with patient demonstrating understanding of each exercise.  Patient was able to tolerate all prescribed exercises with no adverse effects. Patient continues to benefit from skilled PT services and should be progressed as able to improve functional independence.    OBJECTIVE IMPAIRMENTS: decreased activity tolerance, difficulty walking, decreased ROM, decreased strength, increased edema, obesity, and pain.   ACTIVITY LIMITATIONS: carrying, lifting, bending, sitting, standing, squatting, sleeping, stairs, transfers, bed mobility, bathing, dressing, hygiene/grooming, locomotion level, and caring for others  PARTICIPATION LIMITATIONS: meal prep, cleaning, laundry, driving, shopping, and community activity  PERSONAL FACTORS: Fitness, Past/current experiences, Time since onset of injury/illness/exacerbation, and 1 comorbidity: high BMI are also affecting patient's functional outcome.   REHAB POTENTIAL: Good  CLINICAL DECISION MAKING: Evolving/moderate complexity  EVALUATION COMPLEXITY: Moderate   GOALS:  SHORT TERM GOALS: Target date: 09/18/23 Pt will be Ind in an initial HEP  Baseline:started Goal status: MET   2.  Pt will demonstrate AROM for the L knee for 10-100d to progress toward functional ROM  Baseline: 20-90 Goal status: MET  LONG TERM GOALS: Target date: 11/06/23  Pt will be Ind in a final HEP to maintain achieved LOF  Baseline:  Goal status: INITIAL  2.  Pt will demonstrate AROM for the L knee for 0-115d for appropriate functional use with sitting and negotiating steps Baseline: 20-90 Goal status: INITIAL  3.  Pt will demonstrate L knee and hip strength of 4/5 or greater for appropriate functional mobility Baseline: 3/5 Goal status: INITIAL  4.  Improve 5xSTS by MCID of 5 and by MCID of 36ft as indication of improved functional mobility  Baseline: TBA when pt is able to walk without the assistance of a [facility] 09/17/23: 390 feet without AD, 5 x STS: 15.2 sec without UE Goal status: ONGOING  5.  Pt will be able to walk Indly  548ft and negotiate 12 steps for community ambulation Baseline:  Goal status: INITIAL  6.  Pt's LEFS score will improve by the MCID to 59% % as indication of improved function  Baseline: 39% Goal status: INITIAL   PLAN:  PT FREQUENCY: 2x/week  PT DURATION: 8 weeks  PLANNED INTERVENTIONS: 97164- PT Re-evaluation, 97110-Therapeutic exercises, 97530- Therapeutic activity, [id number]- Neuromuscular re-education, 97535- Self Care, [id number]- Manual therapy, [id number]- Gait training, [id number]- Electrical stimulation (unattended), 97016- Vasopneumatic  device, [phone] (1-2 muscles), [id number] (3+ muscles)- Dry Needling, Patient/Family education, Balance training, Stair training, Taping, Joint mobilization, Cryotherapy, and Moist heat  PLAN FOR NEXT SESSION:  assess response to HEP; progress therex as indicated; use of modalities, manual therapy; and TPDN as indicated.    Corean Pouch PTA  10/27/2023 2:39 PM

## 2023-10-28 ENCOUNTER — Ambulatory Visit: Admitting: Physical Therapy

## 2023-10-28 ENCOUNTER — Encounter: Payer: Self-pay | Admitting: Physical Therapy

## 2023-10-28 DIAGNOSIS — R262 Difficulty in walking, not elsewhere classified: Secondary | ICD-10-CM

## 2023-10-28 DIAGNOSIS — M25562 Pain in left knee: Secondary | ICD-10-CM | POA: Diagnosis not present

## 2023-10-28 NOTE — Therapy (Signed)
 OUTPATIENT PHYSICAL THERAPY NOTE   Patient Name: Diane Barker MRN: [id number] DOB:1950/11/15, 73 y.o., female Today's Date: 10/28/2023  END OF SESSION:   PT End of Session - 10/28/23 1318     Visit Number 13    Number of Visits 17    Date for PT Re-Evaluation 11/06/23    Authorization Type [employer] DUAL COMPLETE    PT Start Time 1315    PT Stop Time 1400    PT Time Calculation (min) 45 min          Past Medical History:  Diagnosis Date   Anxiety    Arthritis    all over my body (01/15/2018)   Breast cancer, right breast (HCC) 2000   Rt lumpectomy, Chemo/XRT/rt axillary node    Bronchial asthma    Chronic lower back pain    Depression    Diverticulosis    DVT (deep venous thrombosis) (HCC) 06/2013   RLE   GERD (gastroesophageal reflux disease)    High cholesterol    Hx of adenomatous colonic polyps    Hypertension    Obesity    Osteoarthritis    Personal history of chemotherapy    Personal history of radiation therapy    Pulmonary embolism (HCC) 06/2013   both lungs   Spondylolisthesis    Past Surgical History:  Procedure Laterality Date   BREAST BIOPSY Right 2000   BREAST LUMPECTOMY Right 2000   for CA txed w/ chemo and radiation, right   CARDIAC CATHETERIZATION  03/21/2020   CORONARY ATHERECTOMY N/A 04/03/2022   Procedure: CORONARY ATHERECTOMY;  Surgeon: Swaziland, Peter M, MD;  Location: [facility] INVASIVE CV [facility];  Service: Cardiovascular;  Laterality: N/A;   CORONARY IMAGING/OCT N/A 04/03/2022   Procedure: INTRAVASCULAR IMAGING/OCT;  Surgeon: Swaziland, Peter M, MD;  Location: [facility] INVASIVE CV LAB;  Service: Cardiovascular;  Laterality: N/A;   CORONARY PRESSURE/FFR STUDY N/A 01/18/2018   Procedure: INTRAVASCULAR PRESSURE WIRE/FFR STUDY;  Surgeon: Verlin Lonni BIRCH, MD;  Location: [facility] IN[facility] CV [facility];  Service: Cardiovascular;  Laterality: N/A;   CORONARY PRESSURE/FFR STUDY N/A 04/03/2022   Procedure: INTRAVASCULAR PRESSURE WIRE/FFR STUDY;  Surgeon: Swaziland,  Peter M, MD;  Location: [facility] INVASIVE CV LAB;  Service: Cardiovascular;  Laterality: N/A;   CORONARY STENT INTERVENTION N/A 04/03/2022   Procedure: CORONARY STENT INTERVENTION;  Surgeon: Swaziland, Peter M, MD;  Location: [facility] INVASIVE CV LAB;  Service: Cardiovascular;  Laterality: N/A;   HERNIA REPAIR     LEFT HEART CATH AND CORONARY ANGIOGRAPHY N/A 01/18/2018   Procedure: LEFT HEART CATH AND CORONARY ANGIOGRAPHY;  Surgeon: Verlin Lonni BIRCH, MD;  Location: [facility] IN[facility];  Service: Cardiovascular;  Laterality: N/A;   LEFT HEART CATH AND CORONARY ANGIOGRAPHY N/A 03/21/2020   Procedure: LEFT HEART CATH AND CORONARY ANGIOGRAPHY;  Surgeon: Darron Deatrice LABOR, MD;  Location: [facility] INV[facility] CV [facility];  Service: Cardiovascular;  Laterality: N/A;   LEFT HEART CATH AND CORONARY ANGIOGRAPHY N/A 04/03/2022   Procedure: LEFT HEART CATH AND CORONARY ANGIOGRAPHY;  Surgeon: Swaziland, Peter M, MD;  Location: [facility] INVASIVE CV LAB;  Service: Cardiovascular;  Laterality: N/A;   TOTAL KNEE ARTHROPLASTY Left 08/11/2023   Procedure: LEFT TOTAL KNEE ARTHROPLASTY;  Surgeon: Vernetta Lonni GRADE, MD;  Location: [facility] OR;  Service: Orthopedics;  Laterality: Left;   TUBAL LIGATION     UMBILICAL HERNIA REPAIR  1983   Patient Active Problem List   Diagnosis Date Noted   Status post total left knee replacement 08/11/2023   Unilateral primary osteoarthritis,  left knee 08/10/2023   Hyperlipidemia 08/01/2020   Asthmatic bronchitis, mild persistent, uncomplicated    Unstable angina (HCC) 01/16/2018   Cellulitis of right arm 10/07/2016   Sepsis (HCC) 10/07/2016   Chest pain 10/07/2016   Dyspnea on exertion 05/24/2015   DVT, lower extremity (HCC) 05/09/2014   Right calf pain 06/21/2013   Pulmonary embolism (HCC) 06/21/2013   Dysphagia 06/27/2011   Cough 06/27/2011   CHEST PAIN 01/18/2009   RHINITIS 04/13/2007   Obstructive chronic bronchitis with acute bronchitis (HCC) 04/13/2007   GERD 04/13/2007   Depression 01/21/2007    OSTEOARTHRITIS 01/21/2007   LOW BACK PAIN 01/21/2007   BREAST CANCER, HX OF 01/21/2007    PCP: Onita Rush, MD  REFERRING PROVIDER: Vernetta Lonni GRADE, MD  REFERRING DIAG: [phone] (ICD-10-CM) - Status post total left knee replacement   THERAPY DIAG:  Acute pain of left knee  Difficulty in walking, not elsewhere classified  Rationale for Evaluation and Treatment: Rehabilitation  ONSET DATE: 08/11/23 TKA  Next MD appt: 10/22/23  SUBJECTIVE:   SUBJECTIVE STATEMENT: Pt is having her usual posterior knee pain. Had a Baker's cyst before surgery and still feels posterior knee pain although it is better.    Eval: Pt reports she is doing well with her knee following surgery. Pt notes her L foot at times feels cold and the top of her L foot will hurt. Following her hospitalization, the pt reports she received therapy at [facility] for approx 1 week.  PERTINENT HISTORY: High BMI, arthritis, spondilolisthesis  PAIN:  Are you having pain? Yes: NPRS scale: 0/10 Pain location: Medial L knee Pain description: soreness Aggravating factors: Activity level Relieving factors: pain medication, moving, cold pack  PRECAUTIONS: Knee  RED FLAGS: None   WEIGHT BEARING RESTRICTIONS: No  FALLS:  Has patient fallen in last 6 months? No  LIVING ENVIRONMENT: Lives with: lives alone but daughter is helping Lives in: House/apartment Stairs: Yes: External: 2 steps; none Has following equipment at home: Walker - 2 wheeled, shower chair, and bed side commode  OCCUPATION: Retired  PLOF: Independent  PATIENT GOALS: Good use of my L knee  NEXT MD VISIT: 09/24/23  OBJECTIVE:  Note: Objective measures were completed at Evaluation unless otherwise noted.  DIAGNOSTIC FINDINGS:   PATIENT SURVEYS:  LEFS: 31/80=39% ability  COGNITION: Overall cognitive status: Within functional limits for tasks assessed     SENSATION: WFL  EDEMA:  Present for the L knee  POSTURE: rounded  shoulders, forward head, and flexed trunk   PALPATION: TTP of the L peri-knee especially medially  LOWER EXTREMITY ROM:  Active ROM Left eval Left eval Left  09/17/23 Left 10/20/23 Left 10/28/23  Hip flexion       Hip extension       Hip abduction       Hip adduction       Hip internal rotation       Hip external rotation       Knee flexion 90 95 102 109 A 109 P 113  Knee extension 20 lacking    -7   Ankle dorsiflexion       Ankle plantarflexion       Ankle inversion       Ankle eversion        (Blank rows = not tested)  LOWER EXTREMITY MMT:  MMT Right eval Left eval  Hip flexion 3   Hip extension    Hip abduction 3   Hip adduction    Hip  internal rotation    Hip external rotation 3   Knee flexion 3   Knee extension 3   Ankle dorsiflexion    Ankle plantarflexion    Ankle inversion    Ankle eversion     (Blank rows = not tested)   FUNCTIONAL TESTS:  5 times sit to stand: TBA 2 minute walk test: TBA 09/17/23: 390 feet without AD, 5 x STS: 15.2 sec without UE 10/28/23: 446 feet 2 MWT , 5 x STS: 11.9 sec without UE  GAIT: Distance walked: 200' Assistive device utilized: Environmental consultant - 2 wheeled Level of assistance: Modified independence Comments: Decreased pace                                                                                                                                TREATMENT DATE: [facility] Adult PT Treatment:                                                DATE: 10/28/23 Therapeutic Exercise: Rec Bike forward Level 1 x 5 minutes  Passive knee flexion Knee ext machine 5# bilateral 10 x 2  Bridge x 10 , staggered feet x 10  H/s curls with feet on ball x10  Bridge with feet on ball x 10  Neuromuscular re-ed: Tandem stance > 30 sec  SLS 5 sec  left,  30 sec right  Therapeutic Activity: Stairs 2 MWT 5 x STS  Step up 6 inch x 15 Lateral step down 6 inch x 15    [facility] Adult PT Treatment:                                                DATE:  10/27/23 Therapeutic Exercise: Recumbent bike x4 min backwards, x2 min forward Seated knee flexion GTB 2x 10 LLLD with 1/2 foam roll x4'  STS 2 x 10 arms crossed  SLR with quad set 2x10 SAQ over bolster 2# LLE 3 hold at top 2x10 - cues for controlled descent  LAQ 2# 2x10 LLE Seated heel slides 2x10 HEP update and review   [facility] Adult PT Treatment:                                                DATE: 10/20/23 Therapeutic Exercise: Recumbent bike x4 min backwards, x2 min forward Supine wall/heel slides 2 x 10  Supine long-duration heel slide with foot on wall 5 x 30 sec Seated knee flexion GTB 2x 10 LLLD with 1/2 foam roll x3'  STS 2 x 10 arms  crossed SLR with quad set 2x10  [facility] Adult PT Treatment:                                                DATE: 10/15/2023  Therapeutic Exercise: Recumbent bike x4 min backwards, x2 min forward Supine wall/heel slides 2 x 10  Supine long-duration heel slide with foot on wall 5 x 30 sec Seated knee flexion GTB 2x 10 Supine PROM knee ext  STS 2 x 10      PATIENT EDUCATION:  Education details: Eval findings, POC, HEP, self care  Person educated: Patient Education method: Explanation, Demonstration, Tactile cues, Verbal cues, and Handouts Education comprehension: verbalized understanding, returned demonstration, verbal cues required, and tactile cues required  HOME EXERCISE PROGRAM: Access Code: [email or web address] URL: [email or web address] Date: 10/27/2023 Prepared by: Corean Pouch  Exercises - Supine Quad Set  - 2 x daily - 7 x weekly - 1 sets - 10 reps - 5 hold - Active Straight Leg Raise with Quad Set  - 2 x daily - 7 x weekly - 2 sets - 10 reps - 3 hold - Supine Heel Slide with Strap  - 2 x daily - 7 x weekly - 1 sets - 5 reps - 20 hold - Seated Table Hamstring Stretch  - 2 x daily - 7 x weekly - 1 sets - 5 reps - 30 hold - Seated Long Arc Quad  - 2 x daily - 7 x weekly - 1 sets - 10 reps - 5 hold - Seated Knee Extension  Stretch with Chair  - 2 x daily - 7 x weekly - as long as possible hold  ASSESSMENT:  CLINICAL IMPRESSION: Improved  2 MWT and 5 x STS, partially meeting goal. Also able to ambulate 500 ft or more independently and negotiates stairs MOD I on a regular basis reciprocally. Continued working on quad/ hamstring strength as well knee ROM. See goal section for update. SLS is limited to 5 sec on surgical leg compared to 30 sec on non surgical limb. Will plan to continue improving this at next sessions. Work on [employer] and a gym routine over next several visits. She is approaching POC end date and can likely DC at that time.   Patient presents to PT reporting pain in BIL LE today, she describes behind the kneecaps. She saw surgeon last week who is pleased with how the LE is healing, did state that she needs to work on extension. Updated and reprinted HEP today with patient demonstrating understanding of each exercise. Patient was able to tolerate all prescribed exercises with no adverse effects. Patient continues to benefit from skilled PT services and should be progressed as able to improve functional independence.    OBJECTIVE IMPAIRMENTS: decreased activity tolerance, difficulty walking, decreased ROM, decreased strength, increased edema, obesity, and pain.   ACTIVITY LIMITATIONS: carrying, lifting, bending, sitting, standing, squatting, sleeping, stairs, transfers, bed mobility, bathing, dressing, hygiene/grooming, locomotion level, and caring for others  PARTICIPATION LIMITATIONS: meal prep, cleaning, laundry, driving, shopping, and community activity  PERSONAL FACTORS: Fitness, Past/current experiences, Time since onset of injury/illness/exacerbation, and 1 comorbidity: high BMI are also affecting patient's functional outcome.   REHAB POTENTIAL: Good  CLINICAL DECISION MAKING: Evolving/moderate complexity  EVALUATION COMPLEXITY: Moderate   GOALS:  SHORT TERM GOALS: Target date:  09/18/23 Pt will be Ind in an initial HEP  Baseline:started Goal status: MET   2.  Pt will demonstrate AROM for the L knee for 10-100d to progress toward functional ROM  Baseline: 20-90 Goal status: MET  LONG TERM GOALS: Target date: 11/06/23  Pt will be Ind in a final HEP to maintain achieved LOF  Baseline:  Goal status: ONGOING  2.  Pt will demonstrate AROM for the L knee for 0-115d for appropriate functional use with sitting and negotiating steps Baseline: 20-90 10/28/23: 7-109 Goal status: ONGOING  3.  Pt will demonstrate L knee and hip strength of 4/5 or greater for appropriate functional mobility Baseline: 3/5  Goal status: ONGOING  4.  Improve 5xSTS by MCID of 5 and by MCID of 88ft as indication of improved functional mobility  Baseline: TBA when pt is able to walk without the assistance of a [facility] 09/17/23: 390 feet without AD, 5 x STS: 15.2 sec without UE 10/28/23: 446 feet 2 MWT , 5 x STS: 11.9 sec without UE Goal status: Partially Met   5.  Pt will be able to walk Indly 515ft and negotiate 12 steps for community ambulation Baseline:  10/28/23: has stairs at home and uses daily,  1 flight and ambulates normally  Goal status: MET  6.  Pt's LEFS score will improve by the MCID to 59% % as indication of improved function  Baseline: 39% Goal status: ONGOING   PLAN:  PT FREQUENCY: 2x/week  PT DURATION: 8 weeks  PLANNED INTERVENTIONS: 97164- PT Re-evaluation, 97110-Therapeutic exercises, 97530- Therapeutic activity, [id number]- Neuromuscular re-education, 97535- Self Care, 02859- Manual therapy, [id number]- Gait training, [phone]- Electrical stimulation (unattended), 97016- Vasopneumatic device, 20560 (1-2 muscles), 20561 (3+ muscles)- Dry Needling, Patient/Family education, Balance training, Stair training, Taping, Joint mobilization, Cryotherapy, and Moist heat  PLAN FOR NEXT SESSION:  assess response to HEP; progress therex as indicated; use of modalities, manual therapy; and  TPDN as indicated.    Harlene CHRISTELLA Persons PTA  10/28/2023 2:49 PM

## 2023-11-03 ENCOUNTER — Ambulatory Visit: Admitting: Physical Therapy

## 2023-11-03 ENCOUNTER — Encounter: Payer: Self-pay | Admitting: Physical Therapy

## 2023-11-03 DIAGNOSIS — M25562 Pain in left knee: Secondary | ICD-10-CM | POA: Diagnosis not present

## 2023-11-03 DIAGNOSIS — R262 Difficulty in walking, not elsewhere classified: Secondary | ICD-10-CM

## 2023-11-03 NOTE — Therapy (Addendum)
 " OUTPATIENT PHYSICAL THERAPY NOTE  PHYSICAL THERAPY DISCHARGE SUMMARY  Visits from Start of Care: 14   Current functional level related to goals / functional outcomes: See objective findings/assessment    Remaining deficits: See objective findings/assessment    Education / Equipment: See today's treatment/assessment      Patient agrees to discharge. Patient goals were met. Patient is being discharged due to being pleased with the current functional level.  Diane Barker, PT, DPT  04/04/2024 5:59 PM      Patient Name: Diane Barker MRN: [id number] DOB:[birth year], 73 y.o., female Today's Date: 11/03/2023  END OF SESSION:   PT End of Session - 11/03/23 1520     Visit Number 14    Number of Visits 17    Date for PT Re-Evaluation 11/06/23    Authorization Type [employer] DUAL COMPLETE    PT Start Time 0250    PT Stop Time 0328    PT Time Calculation (min) 38 min           Past Medical History:  Diagnosis Date   Anxiety    Arthritis    all over my body (01/15/2018)   Breast cancer, right breast (HCC) 2000   Rt lumpectomy, Chemo/XRT/rt axillary node    Bronchial asthma    Chronic lower back pain    Depression    Diverticulosis    DVT (deep venous thrombosis) (HCC) 06/2013   RLE   GERD (gastroesophageal reflux disease)    High cholesterol    Hx of adenomatous colonic polyps    Hypertension    Obesity    Osteoarthritis    Personal history of chemotherapy    Personal history of radiation therapy    Pulmonary embolism (HCC) 06/2013   both lungs   Spondylolisthesis    Past Surgical History:  Procedure Laterality Date   BREAST BIOPSY Right 2000   BREAST LUMPECTOMY Right 2000   for CA txed w/ chemo and radiation, right   CARDIAC CATHETERIZATION  03/21/2020   CORONARY ATHERECTOMY N/A 04/03/2022   Procedure: CORONARY ATHERECTOMY;  Surgeon: Jordan, Peter M, MD;  Location: [facility] INVASIVE CV LAB;  Service: Cardiovascular;  Laterality: N/A;   CORONARY  IMAGING/OCT N/A 04/03/2022   Procedure: INTRAVASCULAR IMAGING/OCT;  Surgeon: Jordan, Peter M, MD;  Location: [facility] INVASIVE CV LAB;  Service: Cardiovascular;  Laterality: N/A;   CORONARY PRESSURE/FFR STUDY N/A 01/18/2018   Procedure: INTRAVASCULAR PRESSURE WIRE/FFR STUDY;  Surgeon: Verlin Lonni BIRCH, MD;  Location: [facility] INVASIVE CV LAB;  Service: Cardiovascular;  Laterality: N/A;   CORONARY PRESSURE/FFR STUDY N/A 04/03/2022   Procedure: INTRAVASCULAR PRESSURE WIRE/FFR STUDY;  Surgeon: Jordan, Peter M, MD;  Location: [facility] INVASIVE CV LAB;  Service: Cardiovascular;  Laterality: N/A;   CORONARY STENT INTERVENTION N/A 04/03/2022   Procedure: CORONARY STENT INTERVENTION;  Surgeon: Jordan, Peter M, MD;  Location: [facility] INVASIVE CV LAB;  Service: Cardiovascular;  Laterality: N/A;   HERNIA REPAIR     LEFT HEART CATH AND CORONARY ANGIOGRAPHY N/A 01/18/2018   Procedure: LEFT HEART CATH AND CORONARY ANGIOGRAPHY;  Surgeon: Verlin Lonni BIRCH, MD;  Location: [facility] INVASIVE CV LAB;  Service: Cardiovascular;  Laterality: N/A;   LEFT HEART CATH AND CORONARY ANGIOGRAPHY N/A 03/21/2020   Procedure: LEFT HEART CATH AND CORONARY ANGIOGRAPHY;  Surgeon: Darron Deatrice LABOR, MD;  Location: [facility] INVASIVE CV LAB;  Service: Cardiovascular;  Laterality: N/A;   LEFT HEART CATH AND CORONARY ANGIOGRAPHY N/A 04/03/2022   Procedure: LEFT HEART CATH AND CORONARY ANGIOGRAPHY;  Surgeon: Jordan,  Maude HERO, MD;  Location: [facility] INVASIVE CV LAB;  Service: Cardiovascular;  Laterality: N/A;   TOTAL KNEE ARTHROPLASTY Left 08/11/2023   Procedure: LEFT TOTAL KNEE ARTHROPLASTY;  Surgeon: Vernetta Lonni GRADE, MD;  Location: [facility] OR;  Service: Orthopedics;  Laterality: Left;   TUBAL LIGATION     UMBILICAL HERNIA REPAIR  1983   Patient Active Problem List   Diagnosis Date Noted   Status post total left knee replacement 08/11/2023   Unilateral primary osteoarthritis, left knee 08/10/2023   Hyperlipidemia 08/01/2020   Asthmatic bronchitis, mild persistent,  uncomplicated    Unstable angina (HCC) 01/16/2018   Cellulitis of right arm 10/07/2016   Sepsis (HCC) 10/07/2016   Chest pain 10/07/2016   Dyspnea on exertion 05/24/2015   DVT, lower extremity (HCC) 05/09/2014   Right calf pain 06/21/2013   Pulmonary embolism (HCC) 06/21/2013   Dysphagia 06/27/2011   Cough 06/27/2011   CHEST PAIN 01/18/2009   RHINITIS 04/13/2007   Obstructive chronic bronchitis with acute bronchitis (HCC) 04/13/2007   GERD 04/13/2007   Depression 01/21/2007   OSTEOARTHRITIS 01/21/2007   LOW BACK PAIN 01/21/2007   BREAST CANCER, HX OF 01/21/2007    PCP: Onita Rush, MD  REFERRING PROVIDER: Vernetta Lonni GRADE, MD  REFERRING DIAG: [id number] (ICD-10-CM) - Status post total left knee replacement   THERAPY DIAG:  Acute pain of left knee  Difficulty in walking, not elsewhere classified  Rationale for Evaluation and Treatment: Rehabilitation  ONSET DATE: 08/11/23 TKA  Next MD appt: 10/22/23  SUBJECTIVE:   SUBJECTIVE STATEMENT: Pt arrives without pain, requesting to be discharged from PT due to being pleased with progress.    Eval: Pt reports she is doing well with her knee following surgery. Pt notes her L foot at times feels cold and the top of her L foot will hurt. Following her hospitalization, the pt reports she received therapy at [facility] for approx 1 week.  PERTINENT HISTORY: High BMI, arthritis, spondilolisthesis  PAIN:  Are you having pain? Yes: NPRS scale: 0/10 Pain location: Medial L knee Pain description: soreness Aggravating factors: Activity level Relieving factors: pain medication, moving, cold pack  PRECAUTIONS: Knee  RED FLAGS: None   WEIGHT BEARING RESTRICTIONS: No  FALLS:  Has patient fallen in last 6 months? No  LIVING ENVIRONMENT: Lives with: lives alone but daughter is helping Lives in: House/apartment Stairs: Yes: External: 2 steps; none Has following equipment at home: Walker - 2 wheeled, shower chair, and  bed side commode  OCCUPATION: Retired  PLOF: Independent  PATIENT GOALS: Good use of my L knee  NEXT MD VISIT: 09/24/23  OBJECTIVE:  Note: Objective measures were completed at Evaluation unless otherwise noted.  DIAGNOSTIC FINDINGS:   PATIENT SURVEYS:  LEFS: 31/80=39% ability LEFS 60/80= 75% ability 11/03/23   COGNITION: Overall cognitive status: Within functional limits for tasks assessed     SENSATION: WFL  EDEMA:  Present for the L knee  POSTURE: rounded shoulders, forward head, and flexed trunk   PALPATION: TTP of the L peri-knee especially medially  LOWER EXTREMITY ROM:  Active ROM Left eval Left eval Left  09/17/23 Left 10/20/23 Left 10/28/23 Left 11/03/23  Hip flexion        Hip extension        Hip abduction        Hip adduction        Hip internal rotation        Hip external rotation        Knee flexion  90 95 102 109 A 109 P 113 A 6-115  Knee extension 20 lacking    -7    Ankle dorsiflexion        Ankle plantarflexion        Ankle inversion        Ankle eversion         (Blank rows = not tested)  LOWER EXTREMITY MMT:  MMT Left  eval Left  11/03/23  Hip flexion 3 4+  Hip extension    Hip abduction 3   Hip adduction    Hip internal rotation    Hip external rotation 3 4+  Knee flexion 3 4+  Knee extension 3 5  Ankle dorsiflexion    Ankle plantarflexion    Ankle inversion    Ankle eversion     (Blank rows = not tested)   FUNCTIONAL TESTS:  5 times sit to stand: TBA 2 minute walk test: TBA 09/17/23: 390 feet without AD, 5 x STS: 15.2 sec without UE 10/28/23: 446 feet 2 MWT , 5 x STS: 11.9 sec without UE  GAIT: Distance walked: 200' Assistive device utilized: Environmental [employer]ant - 2 wheeled Level of assistance: Modified independence Comments: Decreased pace                                                                                                                                TREATMENT DATE: [facility] Adult PT Treatment:                                                 DATE: 11/03/23 Therapeutic Exercise: Review of HEP  Nustep L5 x 5 minutes Knee ext 5# x 15 bilat  Knee flex 25# x 15 bilat SLS 22 sec left  Therapeutic Activity: MMT  LEFS Goal check     [facility] Adult PT Treatment:                                                DATE: 10/28/23 Therapeutic Exercise: Rec Bike forward Level 1 x 5 minutes  Passive knee flexion Knee ext machine 5# bilateral 10 x 2  Bridge x 10 , staggered feet x 10  H/s curls with feet on ball x10  Bridge with feet on ball x 10  Neuromuscular re-ed: Tandem stance > 30 sec  SLS 5 sec  left,  30 sec right  Therapeutic Activity: Stairs 2 MWT 5 x STS  Step up 6 inch x 15 Lateral step down 6 inch x 15    [facility] Adult PT Treatment:  DATE: 10/27/23 Therapeutic Exercise: Recumbent bike x4 min backwards, x2 min forward Seated knee flexion GTB 2x 10 LLLD with 1/2 foam roll x4'  STS 2 x 10 arms crossed  SLR with quad set 2x10 SAQ over bolster 2# LLE 3 hold at top 2x10 - cues for controlled descent  LAQ 2# 2x10 LLE Seated heel slides 2x10 HEP update and review   [facility] Adult PT Treatment:                                                DATE: 10/20/23 Therapeutic Exercise: Recumbent bike x4 min backwards, x2 min forward Supine wall/heel slides 2 x 10  Supine long-duration heel slide with foot on wall 5 x 30 sec Seated knee flexion GTB 2x 10 LLLD with 1/2 foam roll x3'  STS 2 x 10 arms crossed SLR with quad set 2x10  [facility] Adult PT Treatment:                                                DATE: 10/15/2023  Therapeutic Exercise: Recumbent bike x4 min backwards, x2 min forward Supine wall/heel slides 2 x 10  Supine long-duration heel slide with foot on wall 5 x 30 sec Seated knee flexion GTB 2x 10 Supine PROM knee ext  STS 2 x 10      PATIENT EDUCATION:  Education details: Eval findings, POC, HEP, self care  Person educated: Patient Education  method: Explanation, Demonstration, Tactile cues, Verbal cues, and Handouts Education comprehension: verbalized understanding, returned demonstration, verbal cues required, and tactile cues required  HOME EXERCISE PROGRAM: Access Code: [email or web address] URL: [email or web address] Date: 10/27/2023 Prepared by: Corean Pouch  Exercises - Supine Quad Set  - 2 x daily - 7 x weekly - 1 sets - 10 reps - 5 hold - Active Straight Leg Raise with Quad Set  - 2 x daily - 7 x weekly - 2 sets - 10 reps - 3 hold - Supine Heel Slide with Strap  - 2 x daily - 7 x weekly - 1 sets - 5 reps - 20 hold - Seated Table Hamstring Stretch  - 2 x daily - 7 x weekly - 1 sets - 5 reps - 30 hold - Seated Long Arc Quad  - 2 x daily - 7 x weekly - 1 sets - 10 reps - 5 hold - Seated Knee Extension Stretch with Chair  - 2 x daily - 7 x weekly - as long as possible hold  ASSESSMENT:  CLINICAL IMPRESSION: Pt arrives reporting no pain and requesting to finish POC today as she is pleased with her progress and would like to go out of town soon. Reviewed LE gym machines and encouraged pt to seek local gym/[employer] for continued strengthening and wellness. Pt is agreeable to this plan. She reached her flexion goal of 115 degrees today however still lacks about 6 degrees of extension. LEFS ability score improved from 39% to 75% indicating improved level of function. All goals MET or partially met. Pt is appropriate for discharge today and is in agreement.     OBJECTIVE IMPAIRMENTS: decreased activity tolerance, difficulty walking, decreased ROM, decreased strength, increased edema, obesity, and pain.   ACTIVITY LIMITATIONS: carrying, lifting, bending,  sitting, standing, squatting, sleeping, stairs, transfers, bed mobility, bathing, dressing, hygiene/grooming, locomotion level, and caring for others  PARTICIPATION LIMITATIONS: meal prep, cleaning, laundry, driving, shopping, and community activity  PERSONAL FACTORS:  Fitness, Past/current experiences, Time since onset of injury/illness/exacerbation, and 1 comorbidity: high BMI are also affecting patient's functional outcome.   REHAB POTENTIAL: Good  CLINICAL DECISION MAKING: Evolving/moderate complexity  EVALUATION COMPLEXITY: Moderate   GOALS:  SHORT TERM GOALS: Target date: 09/18/23 Pt will be Ind in an initial HEP  Baseline:started Goal status: MET   2.  Pt will demonstrate AROM for the L knee for 10-100d to progress toward functional ROM  Baseline: 20-90 Goal status: MET  LONG TERM GOALS: Target date: 11/06/23  Pt will be Ind in a final HEP to maintain achieved LOF  Baseline:  Goal status: MET   2.  Pt will demonstrate AROM for the L knee for 0-115d for appropriate functional use with sitting and negotiating steps Baseline: 20-90 10/28/23: 7-109 11/03/23: 6-115 Goal status: PARTIALLY MET   3.  Pt will demonstrate L knee and hip strength of 4/5 or greater for appropriate functional mobility Baseline: 3/5  11/03/23: grossly 4 to 4+/5 Goal status: MET   4.  Improve 5xSTS by MCID of 5 and by MCID of 22ft as indication of improved functional mobility  Baseline: TBA when pt is able to walk without the assistance of a [facility] 09/17/23: 390 feet without AD, 5 x STS: 15.2 sec without UE 10/28/23: 446 feet 2 MWT , 5 x STS: 11.9 sec without UE Goal status: Partially Met   5.  Pt will be able to walk Indly 543ft and negotiate 12 steps for community ambulation Baseline:  10/28/23: has stairs at home and uses daily,  1 flight and ambulates normally  Goal status: MET  6.  Pt's LEFS score will improve by the MCID to 59% % as indication of improved function  Baseline: 39% 11/03/23: 75% Goal status: MET   PLAN:  PT FREQUENCY: 2x/week  PT DURATION: 8 weeks  PLANNED INTERVENTIONS: 97164- PT Re-evaluation, 97110-Therapeutic exercises, 97530- Therapeutic activity, 97112- Neuromuscular re-education, 97535- Self Care, 02859- Manual therapy, [phone]-  Gait training, [phone]- Electrical stimulation (unattended), 97016- Vasopneumatic device, 20560 (1-2 muscles), 20561 (3+ muscles)- Dry Needling, Patient/Family education, Balance training, Stair training, Taping, Joint mobilization, Cryotherapy, and Moist heat  PLAN FOR NEXT SESSION:   N/A , DC to HEP    Harlene Persons, PTA 11/03/23 3:23 PM Phone: [phone] Fax: [phone]    "

## 2023-11-05 ENCOUNTER — Ambulatory Visit: Admitting: Physical Therapy

## 2023-11-10 ENCOUNTER — Encounter

## 2023-11-12 ENCOUNTER — Ambulatory Visit: Attending: Cardiology | Admitting: Internal Medicine

## 2023-11-12 ENCOUNTER — Encounter: Payer: Self-pay | Admitting: Internal Medicine

## 2023-11-12 VITALS — BP 124/68 | HR 70 | Ht 65.0 in | Wt 223.4 lb

## 2023-11-12 DIAGNOSIS — Z955 Presence of coronary angioplasty implant and graft: Secondary | ICD-10-CM

## 2023-11-12 DIAGNOSIS — Z86718 Personal history of other venous thrombosis and embolism: Secondary | ICD-10-CM

## 2023-11-12 DIAGNOSIS — R0609 Other forms of dyspnea: Secondary | ICD-10-CM | POA: Diagnosis not present

## 2023-11-12 DIAGNOSIS — I25112 Atherosclerotic heart disease of native coronary artery with refractory angina pectoris: Secondary | ICD-10-CM

## 2023-11-12 DIAGNOSIS — Z86711 Personal history of pulmonary embolism: Secondary | ICD-10-CM

## 2023-11-12 DIAGNOSIS — I10 Essential (primary) hypertension: Secondary | ICD-10-CM | POA: Diagnosis not present

## 2023-11-12 DIAGNOSIS — Z96659 Presence of unspecified artificial knee joint: Secondary | ICD-10-CM

## 2023-11-12 DIAGNOSIS — Z7901 Long term (current) use of anticoagulants: Secondary | ICD-10-CM

## 2023-11-12 DIAGNOSIS — I340 Nonrheumatic mitral (valve) insufficiency: Secondary | ICD-10-CM | POA: Diagnosis not present

## 2023-11-12 NOTE — Progress Notes (Signed)
 Cardiology Office Note:  .   Date:  11/12/2023  ID:  Diane Barker, DOB Oct 23, 1950, MRN [id number] PCP: Onita Rush, MD  [facility] Providers Cardiologist:  Soyla DELENA Merck, MD    History of Present Illness: .   Diane Barker is a 73 y.o. female.  Discussed the use of AI scribe software for clinical note transcription with the patient, who gave verbal consent to proceed.  History of Present Illness Diane GIBLER is a 73 year old female who presents for follow up of CAD with both epicardial disease as well as , breast cancer, DVT/PE on chronic anticoagulation.   She describes a new sensation of needing moving air around her, which is a change from her previous tendency to feel cold. She now requires air conditioning or an open window in the car and at home. No significant changes in her breathing, stating it has been 'fine', but she feels short of breath when lifting heavy objects. She is unsure when her last asthma attack occurred but feels it was remote.  She has a history of cellulitis, with a recent episode in her right breast identified by her daughter as being red and 'all broke out'. She was initially prescribed an antibiotic but experienced adverse effects, including chest heaviness, leading to a change in medication. The new medication was prescribed for the cellulitis.  Her current medications include amlodipine , atorvastatin  40 mg daily, isosorbide  (one tab daily 60 mg), and Xarelto  20 mg for her history of DVT and PE. She is not taking aspirin  due to her use of Xarelto .    ROS: negative except per HPI above.  Studies Reviewed: SABRA   EKG Interpretation Date/Time:  Thursday November 12 2023 13:15:06 EDT Ventricular Rate:  70 PR Interval:  174 QRS Duration:  100 QT Interval:  392 QTC Calculation: 423 R Axis:   -29  Text Interpretation: Normal sinus rhythm Incomplete right bundle branch block Moderate voltage criteria for LVH, may be normal variant ( R in aVL , [employer]  product ) When compared with ECG of 18-May-2023 10:22, Nonspecific T wave abnormality no longer evident in Lateral leads Confirmed by Merck Soyla ([id number]) on 11/12/2023 1:17:17 PM    Results LABS Thyroid  function tests: Normal  DIAGNOSTIC Electrocardiogram (EKG): Normal Risk Assessment/Calculations:       Physical Exam:   VS:  BP 124/68   Pulse 70   Ht 5' 5 (1.651 m)   Wt 223 lb 6.4 oz (101.3 kg)   SpO2 95%   BMI 37.18 kg/m    Wt Readings from Last 3 Encounters:  11/12/23 223 lb 6.4 oz (101.3 kg)  08/11/23 230 lb (104.3 kg)  07/30/23 230 lb 14.4 oz (104.7 kg)     Physical Exam GENERAL: Alert, cooperative, well developed, no acute distress. HEENT: Normocephalic, normal oropharynx, moist mucous membranes. CHEST: Clear to auscultation bilaterally, no wheezes, rhonchi, or crackles. CARDIOVASCULAR: Normal heart rate and rhythm, S1 and S2 normal without murmurs. ABDOMEN: Soft, non-tender, non-distended, without organomegaly, normal bowel sounds. EXTREMITIES: No cyanosis or edema. NEUROLOGICAL: Cranial nerves grossly intact, moves all extremities without gross motor or sensory deficit.   ASSESSMENT AND PLAN: .    Assessment and Plan Assessment & Plan Coronary artery disease status post stent placement Mild mitral valve regurgitation Coronary artery disease is well-managed with stable EKG and controlled blood pressure. - Order echocardiogram to assess valve function and ensure no changes. - Continue current medications: amlodipine  2.5 mg daily, atorvastatin  40 mg daily,  isosorbide  60 mg daily, and Xarelto  20 mg daily. - Adjust medications if chest pain or discomfort recurs.  Essential hypertension Blood pressure is well-controlled on current medication regimen. - Continue current antihypertensive medication regimen.  History of deep vein thrombosis and pulmonary embolism Managed with Xarelto . - Continue Xarelto  20 mg daily.  Asthma Asthma. - Monitor for asthma  symptoms and manage accordingly.  Shortness of breath with exertion Mild mitral valve regurgitation Shortness of breath with exertion may be related to asthma or deconditioning post-knee replacement surgery. - Encourage continued physical activity to improve conditioning. - Reassess if symptoms worsen.  History of total knee replacement Recovery is ongoing post-surgery on Aug 10, 2023. - Encourage continued walking and physical activity to improve recovery and conditioning.      Soyla Merck, MD, FACC

## 2023-11-12 NOTE — Patient Instructions (Addendum)
  Medication Instructions:  Your physician recommends that you continue on your current medications as directed. Please refer to the Current Medication list given to you today.  *If you need a refill on your cardiac medications before your next appointment, please call your pharmacy*    Testing/Procedures: Your physician has requested that you have an echocardiogram. Echocardiography is a painless test that uses sound waves to create images of your heart. It provides your doctor with information about the size and shape of your heart and how well your heart's chambers and valves are working. This procedure takes approximately one hour. There are no restrictions for this procedure. Please do NOT wear cologne, perfume, aftershave, or lotions (deodorant is allowed). Please arrive 15 minutes prior to your appointment time.  Please note: We ask at that you not bring children with you during ultrasound (echo/ vascular) testing. Due to room size and safety concerns, children are not allowed in the ultrasound rooms during exams. Our front office staff cannot provide observation of children in our lobby area while testing is being conducted. An adult accompanying a patient to their appointment will only be allowed in the ultrasound room at the discretion of the ultrasound technician under special circumstances. We apologize for any inconvenience.   Follow-Up: At [facility], you and your health needs are our priority.  As part of our continuing mission to provide you with exceptional heart care, our providers are all part of one team.  This team includes your primary Cardiologist (physician) and Advanced Practice Providers or APPs (Physician Assistants and Nurse Practitioners) who all work together to provide you with the care you need, when you need it.  Your next appointment:    12 months with Dr. Loni  We recommend signing up for the patient portal called MyChart.  Sign up information  is provided on this After Visit Summary.  MyChart is used to connect with patients for Virtual Visits (Telemedicine).  Patients are able to view lab/test results, encounter notes, upcoming appointments, etc.  Non-urgent messages can be sent to your provider as well.   To learn more about what you can do with MyChart, go to [email or web address].

## 2023-12-15 ENCOUNTER — Ambulatory Visit (HOSPITAL_COMMUNITY)

## 2024-01-11 ENCOUNTER — Other Ambulatory Visit: Payer: Self-pay | Admitting: Internal Medicine

## 2024-01-14 MED ORDER — AMLODIPINE BESYLATE 2.5 MG PO TABS: 2.5000 mg | ORAL_TABLET | Freq: Every day | ORAL | 2 refills | Status: AC

## 2024-01-14 NOTE — Telephone Encounter (Signed)
 Pt's medication was sent to pt's pharmacy as requested. Confirmation received.

## 2024-01-15 ENCOUNTER — Ambulatory Visit (HOSPITAL_COMMUNITY)
Admission: RE | Admit: 2024-01-15 | Discharge: 2024-01-15 | Disposition: A | Discharge status: Home / Self Care | Source: Ambulatory Visit | Attending: Internal Medicine | Admitting: Internal Medicine

## 2024-01-15 DIAGNOSIS — R0609 Other forms of dyspnea: Secondary | ICD-10-CM | POA: Insufficient documentation

## 2024-01-15 DIAGNOSIS — I340 Nonrheumatic mitral (valve) insufficiency: Secondary | ICD-10-CM | POA: Insufficient documentation

## 2024-01-15 LAB — ECHOCARDIOGRAM COMPLETE
Area-P 1/2: 3.27 cm2
S' Lateral: 3.2 cm

## 2024-01-18 ENCOUNTER — Encounter: Payer: Self-pay | Admitting: Radiology

## 2024-01-26 ENCOUNTER — Ambulatory Visit: Payer: Self-pay | Admitting: Internal Medicine

## 2024-01-27 ENCOUNTER — Ambulatory Visit: Admitting: Orthopaedic Surgery

## 2024-02-01 ENCOUNTER — Ambulatory Visit: Admitting: Internal Medicine

## 2024-02-01 NOTE — Progress Notes (Signed)
 Patient ID: Diane Barker, female    DOB: 1950-10-10, 73 y.o.   MRN: [id number]  HPI F never smoker followed for bronchitis with hx rhinitis, GERD, hx breast cancer, Hx DVT/ PE/ Xarelto . Office Spirometry 04/30/2015- WNL- FVC 2.75/87%, FEV1 2.29/93%, FEV1/FVC 0.83, FEF 25-75 percent 2.83/126%. PFT 01/08/2018- insignificant response to dilator, mild restriction, diffusion mildly reduced.  FVC 2.52/78%, FEV1 2.21/90%, ratio 1.88, FEF 25-75% 3.30/259%, TLC 75%, DLCO 63% CPET 10/14/21- Conclusion: Exercise testing with gas exchange demonstrates normal functional capacity when compared to matched sedentary norms. There is no indication for cardiopulmonary abnormality. Patient appears primarily ventilatory limited due to body habitus.  ECHO 11/19/21- WNL ------------------------------------------------------------------------------   07/30/23- 73 year old female never smoker followed for Bronchitis, Rhinitis, complicated by GERD, hx DVT/PE 2015,  history Breast Cancer/XRT, history DVT/PE/Xarelto , CAD/ stents, Covid infection November 2020,  -Ventolin  hfa, Neb Duoneb, She had reported Breztri  was too strong for her. We had referred to [facility] for stress/ depression- didn't go?? Pending knee replacement CTa PE chest 05/18/23 IMPRESSION: No evidence of pulmonary emboli. Fall sliding-type hiatal hernia. No other focal abnormality is noted. Discussed the use of AI scribe software for clinical note transcription with the patient, who gave verbal consent to proceed.  History of Present Illness   Diane Barker is a 73 year old female who presents for follow-up of asthma.  Her breathing is well-controlled despite a heavy pollen season. She uses an albuterol  inhaler and a nebulizer machine at home. No breathing difficulties are present, and no refills are needed at this time.   She is pending left total knee replacement soon. No obvious pulmonary red-flags at this time.     Assessment and Plan:    Asthma moderate persistent uncomplicated Use of albuterol  inhaler and nebulizer Respiratory symptoms well-controlled with albuterol . No recent exacerbations. - Continue albuterol  inhaler and nebulizer as needed.  Left knee pain Chronic pain requires surgical intervention. Discussed skilled nursing facility for post-operative recovery. - Discuss post-operative care options with surgical team, including potential stay at a skilled nursing facility.    02/02/24- 73 year old female never smoker followed for Bronchitis, Rhinitis, complicated by GERD, hx DVT/PE 2015/Xarelto ,  history Breast Cancer/XRT, history DVT/PE/Xarelto , CAD/ stents, Covid infection November 2020,  -Ventolin  hfa, Neb Duoneb, She had reported Breztri  was too strong for her. -----Cough x 3 weeks Discussed the use of AI scribe software for clinical note transcription with the patient, who gave verbal consent to proceed.  History of Present Illness   Diane Barker is a 73 year old female who presents with a persistent dry cough.  She experiences a continuous, non-productive cough with occasional rhinorrhea. She has used various inhalers, including albuterol  and Breztri , but found Breztri  too strong. Symbicort 's effects are not recalled. DayQuil daytime cough syrup provides only temporary relief. She denies shortness of breath, heartburn, or reflux. She is moving to her daughter's home, which has cats, but does not believe they contribute to her symptoms as she has been around them before without issue.     CTaPE 05/18/23 IMPRESSION: No evidence of pulmonary emboli. Fall sliding-type hiatal hernia. No other focal abnormality is noted.  Assessment and Plan:    Chronic dry cough due to mild asthmatic bronchitis Chronic dry cough likely due to mild asthmatic bronchitis. No recent infection or significant postnasal drip. Previous inhalers not tolerated. Albuterol  used infrequently. No significant shortness of breath or reflux  symptoms. Environmental factors considered. - Administered cortisone shot for symptomatic relief. - Advised albuterol  inhaler  every six hours as needed, cautioning potential jitteriness. - Recommended zero sugar Chrystie Rancher hard candies for throat soothing. - Administered flu shot.     Review of Systems-See HPI   + = positive Constitutional:   No-   weight loss, night sweats, fevers, chills, fatigue, lassitude. HEENT:   +  headaches, No-difficulty swallowing, tooth/dental problems, +sore throat,        sneezing, itching, ear ache, nasal congestion, +post nasal drip,  CV:  +atypical chest pain, orthopnea, PND, swelling in lower extremities, anasarca,  dizziness, palpitations Resp: +shortness of breath with exertion or at rest.              + productive cough,  + non-productive cough,  No- coughing up of blood.           change in color of mucus.  No- wheezing.   Skin: No-   rash or lesions. GI:  +  heartburn, indigestion, abdominal pain, +nausea, vomiting,  GU: MS:  + L calf pain Neuro-     nothing unusual Psych:  No- change in mood or affect. No depression or anxiety.  No memory loss.  Objective:  OBJ- Physical Exam General- Alert, Oriented, Affect-+episodic crying, then calm, Distress- none acute, + obese Skin- rash-none, lesions- none, excoriation+  Lymphadenopathy- none Head- atraumatic            Eyes- Gross vision intact, PERRLA, conjunctivae and secretions clear            Ears- Hearing, canals-normal            Nose- clear, no-Septal dev, mucus, polyps, erosion, perforation             Throat- Mallampati IV , mucosa clear , drainage- none, tonsils- atrophic,  + Upper plate and many missing teeth Neck- flexible , trachea midline, no stridor , thyroid  nl, carotid no bruit Chest - symmetrical excursion , unlabored           Heart/CV- RRR , no murmur , no gallop  , no rub, nl s1 s2                           - JVD- none , edema-none, stasis changes- none, varices- none            Lung-  unlabored/ clear, wheeze- none, cough+ mild dry, dullness-none, rub- none           Chest wall-  Abd-  Br/ Gen/ Rectal- Not done, not indicated Extrem- +elastic hose Neuro- grossly intact to observation

## 2024-02-02 ENCOUNTER — Ambulatory Visit: Admitting: Internal Medicine

## 2024-02-02 ENCOUNTER — Encounter: Payer: Self-pay | Admitting: Internal Medicine

## 2024-02-02 VITALS — BP 113/72 | HR 65 | Temp 97.9°F | Ht 65.0 in | Wt 223.6 lb

## 2024-02-02 DIAGNOSIS — R053 Chronic cough: Secondary | ICD-10-CM | POA: Diagnosis not present

## 2024-02-02 DIAGNOSIS — Z23 Encounter for immunization: Secondary | ICD-10-CM

## 2024-02-02 DIAGNOSIS — J4530 Mild persistent asthma, uncomplicated: Secondary | ICD-10-CM | POA: Diagnosis not present

## 2024-02-02 MED ORDER — METHYLPREDNISOLONE ACETATE 80 MG/ML IJ SUSP
80.0000 mg | Freq: Once | INTRAMUSCULAR | Status: AC
  Administered 2024-02-02: 80 mg via INTRAMUSCULAR

## 2024-02-02 NOTE — Patient Instructions (Signed)
 Order- flu vax senior  Order- Depo 80    dx asthmatic bronchitis  Suggest- Zero Sugar Chrystie Rancher hard candies to soothe throat  Please call if we can help

## 2024-02-04 ENCOUNTER — Encounter: Payer: Self-pay | Admitting: Orthopaedic Surgery

## 2024-02-04 ENCOUNTER — Other Ambulatory Visit (INDEPENDENT_AMBULATORY_CARE_PROVIDER_SITE_OTHER): Payer: Self-pay

## 2024-02-04 ENCOUNTER — Ambulatory Visit: Admitting: Orthopaedic Surgery

## 2024-02-04 DIAGNOSIS — Z96652 Presence of left artificial knee joint: Secondary | ICD-10-CM

## 2024-02-04 NOTE — Progress Notes (Signed)
 The patient is here today at 6 months status post a left total knee replacement to treat significant left knee pain and arthritis.  She is a very active 73 year old female.  She reports that she is doing well overall.  She does have a pinching and station sometimes when she turns in bed and some sensitivity when she puts her knee down on something with pressure.  Overall though she is doing well.    On exam there is only some mild swelling of her left operative knee.  Her extension is full and her flexion is full and the knee feels ligamentously stable.  Standing films of the left knee today show well-seated cemented total knee arthroplasty with no complicating features.  At this point follow-up can be in 6 months from now for the 1 year standpoint from surgery.  If she has any issues before then she will let us  know.  At her 1 year follow-up will have a final AP and lateral of her left operative knee.

## 2024-03-06 ENCOUNTER — Other Ambulatory Visit: Payer: Self-pay | Admitting: Internal Medicine

## 2024-03-06 DIAGNOSIS — I824Z1 Acute embolism and thrombosis of unspecified deep veins of right distal lower extremity: Secondary | ICD-10-CM

## 2024-04-08 ENCOUNTER — Other Ambulatory Visit: Payer: Self-pay | Admitting: Internal Medicine

## 2024-04-14 ENCOUNTER — Telehealth: Payer: Self-pay

## 2024-04-14 ENCOUNTER — Other Ambulatory Visit: Payer: Self-pay

## 2024-04-14 NOTE — Telephone Encounter (Signed)
 Copied from CRM [id number]. Topic: Clinical - Medication Refill >> Apr 13, 2024  2:20 PM Rozanna MATSU wrote: Medication: albuterol  (VENTOLIN  HFA) 108 (90 Base) MCG/ACT inhaler  Has the patient contacted their pharmacy? Yes (Agent: If no, request that the patient contact the pharmacy for the refill. If patient does not wish to contact the pharmacy document the reason why and proceed with request.) (Agent: If yes, when and what did the pharmacy advise?)  This is the patient's preferred pharmacy:    [id number] - [place], [place] - [address] AT [facility] [address] [place] [address] Phone: [phone] Fax: [phone]  Is this the correct pharmacy for this prescription? Yes If no, delete pharmacy and type the correct one.   Has the prescription been filled recently? Yes  Is the patient out of the medication? No  Has the patient been seen for an appointment in the last year OR does the patient have an upcoming appointment? Yes  Can we respond through MyChart? No  Agent: Please be advised that Rx refills may take up to 3 business days. We ask that you follow-up with your pharmacy.   Rx was sent to Pharmacy on 1/20 - new Rx sent to pharmacy

## 2024-08-03 ENCOUNTER — Ambulatory Visit: Admitting: Orthopaedic Surgery

## 2024-08-18 ENCOUNTER — Ambulatory Visit: Admitting: Internal Medicine
